# Patient Record
Sex: Male | Born: 1965 | Race: White | Hispanic: No | Marital: Single | Smoking: Former smoker
Health system: Southern US, Community
[De-identification: ages and names within clinical notes are randomized; demographics above are authoritative.]

## PROBLEM LIST (undated history)

## (undated) ENCOUNTER — Emergency Department (HOSPITAL_COMMUNITY): Payer: BC Managed Care – PPO

## (undated) DIAGNOSIS — I201 Angina pectoris with documented spasm: Secondary | ICD-10-CM

## (undated) DIAGNOSIS — E785 Hyperlipidemia, unspecified: Secondary | ICD-10-CM

## (undated) DIAGNOSIS — Z9889 Other specified postprocedural states: Secondary | ICD-10-CM

## (undated) DIAGNOSIS — E119 Type 2 diabetes mellitus without complications: Secondary | ICD-10-CM

## (undated) DIAGNOSIS — I2510 Atherosclerotic heart disease of native coronary artery without angina pectoris: Secondary | ICD-10-CM

## (undated) DIAGNOSIS — R112 Nausea with vomiting, unspecified: Secondary | ICD-10-CM

## (undated) DIAGNOSIS — M1990 Unspecified osteoarthritis, unspecified site: Secondary | ICD-10-CM

## (undated) DIAGNOSIS — K219 Gastro-esophageal reflux disease without esophagitis: Secondary | ICD-10-CM

## (undated) DIAGNOSIS — I10 Essential (primary) hypertension: Secondary | ICD-10-CM

## (undated) DIAGNOSIS — T4145XA Adverse effect of unspecified anesthetic, initial encounter: Secondary | ICD-10-CM

## (undated) DIAGNOSIS — K259 Gastric ulcer, unspecified as acute or chronic, without hemorrhage or perforation: Secondary | ICD-10-CM

## (undated) DIAGNOSIS — I219 Acute myocardial infarction, unspecified: Secondary | ICD-10-CM

## (undated) DIAGNOSIS — K5900 Constipation, unspecified: Secondary | ICD-10-CM

## (undated) HISTORY — DX: Essential (primary) hypertension: I10

## (undated) HISTORY — DX: Type 2 diabetes mellitus without complications: E11.9

## (undated) HISTORY — DX: Angina pectoris with documented spasm: I20.1

---

## 2000-06-24 HISTORY — PX: KNEE ARTHROSCOPY: SUR90

## 2001-12-31 ENCOUNTER — Ambulatory Visit (HOSPITAL_COMMUNITY): Admission: RE | Admit: 2001-12-31 | Discharge: 2001-12-31 | Payer: Self-pay | Admitting: Internal Medicine

## 2001-12-31 ENCOUNTER — Encounter: Payer: Self-pay | Admitting: Internal Medicine

## 2011-06-25 HISTORY — PX: CARDIAC CATHETERIZATION: SHX172

## 2011-08-18 ENCOUNTER — Encounter (HOSPITAL_COMMUNITY)
Admission: EM | Disposition: A | Discharge status: Home / Self Care | Payer: Self-pay | Source: Ambulatory Visit | Attending: Cardiovascular Disease

## 2011-08-18 ENCOUNTER — Inpatient Hospital Stay (HOSPITAL_COMMUNITY)
Admission: EM | Admit: 2011-08-18 | Discharge: 2011-08-20 | DRG: 287 | Disposition: A | Discharge status: Home / Self Care | Payer: Self-pay | Source: Ambulatory Visit | Attending: Cardiovascular Disease | Admitting: Cardiovascular Disease

## 2011-08-18 ENCOUNTER — Other Ambulatory Visit: Payer: Self-pay

## 2011-08-18 ENCOUNTER — Encounter (HOSPITAL_COMMUNITY): Payer: Self-pay | Admitting: *Deleted

## 2011-08-18 DIAGNOSIS — Z79899 Other long term (current) drug therapy: Secondary | ICD-10-CM

## 2011-08-18 DIAGNOSIS — Z6838 Body mass index (BMI) 38.0-38.9, adult: Secondary | ICD-10-CM

## 2011-08-18 DIAGNOSIS — G4733 Obstructive sleep apnea (adult) (pediatric): Secondary | ICD-10-CM | POA: Diagnosis present

## 2011-08-18 DIAGNOSIS — E785 Hyperlipidemia, unspecified: Secondary | ICD-10-CM | POA: Diagnosis present

## 2011-08-18 DIAGNOSIS — I2510 Atherosclerotic heart disease of native coronary artery without angina pectoris: Secondary | ICD-10-CM | POA: Diagnosis present

## 2011-08-18 DIAGNOSIS — R9431 Abnormal electrocardiogram [ECG] [EKG]: Secondary | ICD-10-CM | POA: Diagnosis present

## 2011-08-18 DIAGNOSIS — E669 Obesity, unspecified: Secondary | ICD-10-CM | POA: Diagnosis present

## 2011-08-18 DIAGNOSIS — E119 Type 2 diabetes mellitus without complications: Secondary | ICD-10-CM | POA: Diagnosis present

## 2011-08-18 DIAGNOSIS — I201 Angina pectoris with documented spasm: Principal | ICD-10-CM | POA: Diagnosis present

## 2011-08-18 HISTORY — PX: LEFT HEART CATH: SHX5478

## 2011-08-18 LAB — CARDIAC PANEL(CRET KIN+CKTOT+MB+TROPI)
CK, MB: 2 ng/mL (ref 0.3–4.0)
CK, MB: 2.1 ng/mL (ref 0.3–4.0)
Relative Index: INVALID (ref 0.0–2.5)
Relative Index: INVALID (ref 0.0–2.5)
Total CK: 99 U/L (ref 7–232)
Total CK: 89 U/L (ref 7–232)
Troponin I: <0.3 ng/mL (ref <0.30)
Troponin I: <0.3 ng/mL (ref <0.30)

## 2011-08-18 LAB — POCT I-STAT, CHEM 8
BUN: 9 mg/dL (ref 6–23)
Calcium, Ion: 1.24 mmol/L (ref 1.12–1.32)
Chloride: 102 mEq/L (ref 96–112)
Creatinine, Ser: 0.8 mg/dL (ref 0.50–1.35)
Glucose, Bld: 262 mg/dL — ABNORMAL HIGH (ref 70–99)
TCO2: 23 mmol/L (ref 0–100)

## 2011-08-18 LAB — HEMOGLOBIN A1C
Hgb A1c MFr Bld: 8.1 % — ABNORMAL HIGH (ref <5.7)
Mean Plasma Glucose: 186 mg/dL — ABNORMAL HIGH (ref <117)

## 2011-08-18 LAB — GLUCOSE, CAPILLARY: Glucose-Capillary: 196 mg/dL — ABNORMAL HIGH (ref 70–99)

## 2011-08-18 SURGERY — LEFT HEART CATH
Anesthesia: Moderate Sedation

## 2011-08-18 MED ORDER — NITROGLYCERIN 0.2 MG/ML ON CALL CATH LAB
INTRAVENOUS | Status: AC
  Filled 2011-08-18: qty 1

## 2011-08-18 MED ORDER — METOPROLOL TARTRATE 12.5 MG HALF TABLET
12.5000 mg | ORAL_TABLET | Freq: Two times a day (BID) | ORAL | Status: DC
  Administered 2011-08-18 – 2011-08-20 (×4): 12.5 mg via ORAL
  Filled 2011-08-18 (×6): qty 1

## 2011-08-18 MED ORDER — PANTOPRAZOLE SODIUM 40 MG PO TBEC
40.0000 mg | DELAYED_RELEASE_TABLET | Freq: Every day | ORAL | Status: DC
  Administered 2011-08-18 – 2011-08-20 (×3): 40 mg via ORAL
  Filled 2011-08-18 (×3): qty 1

## 2011-08-18 MED ORDER — NITROGLYCERIN IN D5W 200-5 MCG/ML-% IV SOLN: 2.0000 ug/min | INTRAVENOUS | Status: DC

## 2011-08-18 MED ORDER — ACETAMINOPHEN 325 MG PO TABS: 650.0000 mg | ORAL_TABLET | ORAL | Status: DC | PRN

## 2011-08-18 MED ORDER — ASPIRIN 81 MG PO CHEW
81.0000 mg | CHEWABLE_TABLET | Freq: Every day | ORAL | Status: DC
  Administered 2011-08-18 – 2011-08-20 (×3): 81 mg via ORAL
  Filled 2011-08-18 (×3): qty 1

## 2011-08-18 MED ORDER — SODIUM CHLORIDE 0.9 % IV SOLN: INTRAVENOUS | Status: DC

## 2011-08-18 MED ORDER — INSULIN ASPART 100 UNIT/ML ~~LOC~~ SOLN
0.0000 [IU] | Freq: Three times a day (TID) | SUBCUTANEOUS | Status: DC
  Administered 2011-08-18 – 2011-08-20 (×5): 2 [IU] via SUBCUTANEOUS
  Filled 2011-08-18: qty 3

## 2011-08-18 MED ORDER — ZOLPIDEM TARTRATE 5 MG PO TABS
10.0000 mg | ORAL_TABLET | Freq: Every evening | ORAL | Status: DC | PRN
  Administered 2011-08-18: 10 mg via ORAL
  Filled 2011-08-18: qty 2

## 2011-08-18 MED ORDER — METFORMIN HCL 500 MG PO TABS
500.0000 mg | ORAL_TABLET | Freq: Two times a day (BID) | ORAL | Status: DC
  Filled 2011-08-18 (×3): qty 1

## 2011-08-18 MED ORDER — ATORVASTATIN CALCIUM 40 MG PO TABS
40.0000 mg | ORAL_TABLET | Freq: Every day | ORAL | Status: DC
  Administered 2011-08-18 – 2011-08-19 (×2): 40 mg via ORAL
  Filled 2011-08-18 (×3): qty 1

## 2011-08-18 MED ORDER — ONDANSETRON HCL 4 MG/2ML IJ SOLN: 4.0000 mg | Freq: Four times a day (QID) | INTRAMUSCULAR | Status: DC | PRN

## 2011-08-18 MED ORDER — HEPARIN (PORCINE) IN NACL 2-0.9 UNIT/ML-% IJ SOLN
INTRAMUSCULAR | Status: AC
  Filled 2011-08-18: qty 2000

## 2011-08-18 MED ORDER — MIDAZOLAM HCL 2 MG/2ML IJ SOLN
INTRAMUSCULAR | Status: AC
  Filled 2011-08-18: qty 2

## 2011-08-18 MED ORDER — FENTANYL CITRATE 0.05 MG/ML IJ SOLN
INTRAMUSCULAR | Status: AC
  Filled 2011-08-18: qty 2

## 2011-08-18 MED ORDER — LIDOCAINE HCL (PF) 1 % IJ SOLN
INTRAMUSCULAR | Status: AC
  Filled 2011-08-18: qty 30

## 2011-08-18 MED ORDER — INSULIN ASPART 100 UNIT/ML ~~LOC~~ SOLN
0.0000 [IU] | Freq: Every day | SUBCUTANEOUS | Status: DC
  Filled 2011-08-18: qty 3

## 2011-08-18 NOTE — H&P (Signed)
Patient ID: Jerome Shepherd MRN: [id number], DOB/AGE: 08-01-1965   Admit date: 08/18/2011   Primary Physician: No primary provider on file. Primary Cardiologist: Dr Claiborne Billings  HPI: Patient is a 46 year old male with no prior history of coronary disease but a history of diabetes who developed substernal chest pain around 5 AM and presented to [facility]. Patient describes awakening with a severe midsternal discomfort. There was no radiation to his jaw or arms. There was no associated shortness of breath or nausea and vomiting. He did not have diaphoresis. He went and woke up his neighbor who drove in to the hospital. At [facility] his EKG suggested some anterior ST elevation. He was transferred to [place] as a ST elevation MI. Patient was taken to the cath lab by Dr. Claiborne Billings. Please see his Note for complete details. The patient denies any recent exertional dyspnea or chest pain.   Problem List: Past Medical History  Diagnosis Date  . Diabetes mellitus     Past Surgical History  Procedure Date  . No past surgeries      Allergies:  Allergies  Allergen Reactions  . Codeine Nausea Only  . Sulfa Antibiotics Nausea Only     Home Medications Prescriptions prior to admission  Medication Sig Dispense Refill  . ibuprofen (ADVIL) 200 MG tablet Take 200 mg by mouth every 6 (six) hours as needed. For pain/headaches.      . metFORMIN (GLUCOPHAGE) 500 MG tablet Take 1,000 mg by mouth 2 (two) times daily with a meal.         Family history negative for CAD.    History   Social History  . Marital Status: Divorced    Spouse Name: N/A    Number of Children: N/A  . Years of Education: N/A   Occupational History  . Not on file.   Social History Main Topics  . Smoking status: Former Smoker    Quit date: 08/18/1995  . Smokeless tobacco: Not on file  . Alcohol Use: No  . Drug Use: No  . Sexually Active:    Other Topics Concern  . Not on file   Social History Narrative    Divorced, no children, works as a Production designer, theatre/television/film.     Review of Systems: General: negative for chills, fever, night sweats or weight changes.  Cardiovascular: negative for chest pain, dyspnea on exertion, edema, orthopnea, palpitations, paroxysmal nocturnal dyspnea or shortness of breath Dermatological: negative for rash Respiratory: negative for cough or wheezing Urologic: negative for hematuria Abdominal: negative for nausea, vomiting, diarrhea, bright red blood per rectum, melena, or hematemesis Neurologic: negative for visual changes, syncope, or dizziness All other systems reviewed and are otherwise negative except as noted above.  Physical Exam: Blood pressure 131/64, pulse 51, temperature 98.2 F (36.8 C), temperature source Oral, resp. rate 16, height 6' 4"  (1.93 m), weight 143 kg (315 lb 4.1 oz), SpO2 96.00%.  General appearance: alert, cooperative, appears stated age, no distress and mildly obese Neck: no adenopathy, no carotid bruit, no JVD, supple, symmetrical, trachea midline and thyroid not enlarged, symmetric, no tenderness/mass/nodules Lungs: clear to auscultation bilaterally Heart: regular rate and rhythm, S1, S2 normal, no murmur, click, rub or gallop Abdomen: soft, non-tender; bowel sounds normal; no masses,  no organomegaly Extremities: extremities normal, atraumatic, no cyanosis or edema Pulses: 2+ and symmetric Skin: Skin color, texture, turgor normal. No rashes or lesions Neurologic: Grossly normal    Labs:   Results for orders placed during the hospital  encounter of 08/18/11 (from the past 24 hour(s))  POCT I-STAT, CHEM 8     Status: Abnormal   Collection Time   08/18/11  8:06 AM      Component Value Range   Sodium 138  135 - 145 (mEq/L)   Potassium 3.7  3.5 - 5.1 (mEq/L)   Chloride 102  96 - 112 (mEq/L)   BUN 9  6 - 23 (mg/dL)   Creatinine, Ser 0.80  0.50 - 1.35 (mg/dL)   Glucose, Bld 262 (*) 70 - 99 (mg/dL)   Calcium, Ion 1.24  1.12 - 1.32  (mmol/L)   TCO2 23  0 - 100 (mmol/L)   Hemoglobin 14.3  13.0 - 17.0 (g/dL)   HCT 42.0  39.0 - 52.0 (%)  GLUCOSE, CAPILLARY     Status: Abnormal   Collection Time   08/18/11 12:26 PM      Component Value Range   Glucose-Capillary 169 (*) 70 - 99 (mg/dL)     Radiology/Studies: No results found.  EKG:NSR with suggestion of ST elevation in V2 and V3  ASSESSMENT AND PLAN:   Principal Problem:  *STEMI (ST elevation myocardial infarction)  Active Problems:  Chest pain  Diabetes mellitus, Type 2  Obesity, suspected sleep apnea  Plan-urgent cath  Henri Medal, PA-C 08/18/2011, 12:53 PM

## 2011-08-18 NOTE — Brief Op Note (Signed)
08/18/2011  8:31 AM  PATIENT:  Jerome Shepherd  46 y.o. male  PRE-OPERATIVE DIAGNOSIS:  ACS   Full note dictated; see diagram  DICTATION #  [id number], [id number]  Mild non obstructive CAD; suspect transient coronary vasospasm to  LAD improved with NTG leading to 2 mm J pt elevation V1 - V2 which resolved.  Medical therapy with nitrates/ statin.  Needs evaluation for OSA.  Troy Sine, MD, [facility] 08/18/2011 8:31 AM

## 2011-08-18 NOTE — Cardiovascular Report (Signed)
NAMEAXCEL, HORSCH NO.:  [id number]  MEDICAL RECORD NO.:  [id number]  LOCATION:  2914                         FACILITY:  [facility]  PHYSICIAN:  Shelva Majestic, M.D.     DATE OF BIRTH:  [birth year]  DATE OF PROCEDURE: DATE OF DISCHARGE:                           CARDIAC CATHETERIZATION   INDICATIONS:  [name] is a 46 year old, Caucasian gentleman with history of obesity as well as at least 20-year history of diabetes mellitus.  Last evening, the patient went to [employer].  This morning at approximately 5 a.m., he was awakened from sleep with severe pressure and chest discomfort.  He ultimately presented to [facility] where his ECG suggested early acute coronary syndrome, ST-segment elevation in leads V1, V2, with up to 2 mm J-point elevation with hyperacute T-waves.  The patient was transported to [facility] for possible code STEMI.  En route, he did receive heparin as well as nitroglycerin.  Chest pain completely resolved upon arrival to [facility] where he was taken directly to the catheterizations laboratory.  In the catheterizations laboratory, there was resolution of his ST-segment elevation.  However, with his risk factor profile, the decision was made to proceed with cardiac catheterizations for probable acute coronary syndrome/coronary vasal spasm.  DESCRIPTION OF PROCEDURE:  The patient was prepped and draped in usual fashion.  His right femoral artery is punctured anteriorly and a 6- [place] sheath was inserted without difficulty.  Versed 2 mg plus fentanyl 50 mcg were administered.  Coronary angiography was performed utilizing 6-French Judkins 4 left and right coronary catheters.  200 mcg of intracoronary nitroglycerin was also selectively administered down the left coronary system.  A 6-French pigtail catheter was used for biplane cine left ventriculography.  Distal aortography was performed. He tolerated the procedure well.  He  will be transported to the transitional care unit for followup evaluation.  HEMODYNAMIC DATA:  Central aortic pressure was 129/78.  Left ventricular pressure 129/9, post A-wave 16.  ANGIOGRAPHIC DATA:  Left main coronary artery was a long normal vessel, which had some very smooth, mild distal taper prior to trifurcating into an LAD, intermediate, and circumflex system.  The LAD had luminal irregularities of 20-30% proximally in the region of the 1st septal and diagonal vessel.  In the mid LAD, there did appear to be an area of mild narrowing of 40%, which appeared in an area that possibly dipped intramyocardially.  There was TIMI-3 flow.  The LAD extended to and wrapped around the LV apex.  The intermediate like vessel was angiographically normal.  The circumflex vessel had mild narrowing in its mid segment prior to bifurcating into a small distal branch and a distal obtuse marginal vessel.  The right coronary artery was a large caliber dominant vessel that had luminal irregularities of 30% in the proximal to mid segment with 30% narrowing in the mid segment.  The RCA supplied the PDA and posterolateral branch.  Following IC nitroglycerin to the LAD, the mid LAD area seemed to improve as did the circumflex area to less than 20%.  Biplane cine left ventriculography suggested left ventricular hypertrophy.  Ejection fraction was approximately 60%.  No definitive wall  motion abnormality was demonstrated.  There is a questionable subtle mild abnormality apically, which was not definitive.  Distal aortography revealed widely patent renal arteries.  There was no evidence for dissection.  There was no significant aortoiliac disease.  IMPRESSION: 1. Probable acute coronary syndrome, leading to transient coronary     vasospasm, relieved with nitroglycerin. 2. Mild, currently nonobstructive coronary artery disease, with 20-30%     proximal left anterior descending stenoses, 40% mid  left anterior     descending narrowing, 40% circumflex narrowing, and scattered 30%     proximal to mid right coronary artery narrowing.  The left system     narrowing were improved following IC nitroglycerin. 3. Normal left ventricular function with suggestion of left     ventricular hypertrophy without definitive wall motion abnormality.  DISCUSSION:  Mr. Hunley is 46 year old gentleman, who has at least 88- year history of diabetes mellitus as well as a longstanding history of obesity.  Late last evening, he did have [employer] [place].  He has had difficulty with sleep.  At approximately 50 a.m. this morning, he awakened from a deep sleep with severe pressure in his chest, suggesting most likely sleep apnea induced during REM sleep with associated possible vasospasm in this setting of endothelial dysfunction contributed by his fatty meal intake prior to going to bed.  His chest pain completely resolved with nitroglycerin.  Initial ECG did show 2 mm ST-segment elevation in leads V1 and V2, with complete resolution following nitroglycerin.  Catheterizations reveals nonobstructive coronary artery disease, but with evidence for luminal irregularities as noted above.  He will be aggressively treated with medical therapy, including nitrates, lipid-lowering therapy, as well as started on aspirin.  The patient will require a sleep study to evaluate for high likelihood of obstructive sleep apnea.          ______________________________ Shelva Majestic, M.D.     TK/MEDQ  D:  08/18/2011  T:  08/18/2011  Job:  [id number]  cc:   Jerene Bears, MD

## 2011-08-19 LAB — COMPREHENSIVE METABOLIC PANEL
ALT: 23 U/L (ref 0–53)
AST: 35 U/L (ref 0–37)
Albumin: 3.4 g/dL — ABNORMAL LOW (ref 3.5–5.2)
Alkaline Phosphatase: 65 U/L (ref 39–117)
BUN: 9 mg/dL (ref 6–23)
CO2: 23 mEq/L (ref 19–32)
Calcium: 9.3 mg/dL (ref 8.4–10.5)
Chloride: 105 mEq/L (ref 96–112)
Creatinine, Ser: 0.9 mg/dL (ref 0.50–1.35)
GFR calc Af Amer: >90 mL/min (ref >90)
GFR calc non Af Amer: >90 mL/min (ref >90)
Glucose, Bld: 165 mg/dL — ABNORMAL HIGH (ref 70–99)
Potassium: 3.9 mEq/L (ref 3.5–5.1)
Sodium: 138 mEq/L (ref 135–145)
Total Bilirubin: 0.7 mg/dL (ref 0.3–1.2)
Total Protein: 6.2 g/dL (ref 6.0–8.3)

## 2011-08-19 LAB — LIPID PANEL
Cholesterol: 150 mg/dL (ref 0–200)
HDL: 32 mg/dL — ABNORMAL LOW (ref >39)
LDL Cholesterol: 83 mg/dL (ref 0–99)
Total CHOL/HDL Ratio: 4.7 RATIO
Triglycerides: 177 mg/dL — ABNORMAL HIGH (ref <150)
VLDL: 35 mg/dL (ref 0–40)

## 2011-08-19 LAB — CBC
HCT: 42.6 % (ref 39.0–52.0)
Hemoglobin: 15.5 g/dL (ref 13.0–17.0)
MCH: 32.2 pg (ref 26.0–34.0)
MCHC: 36.4 g/dL — ABNORMAL HIGH (ref 30.0–36.0)
MCV: 88.4 fL (ref 78.0–100.0)
Platelets: 197 10*3/uL (ref 150–400)
RBC: 4.82 MIL/uL (ref 4.22–5.81)
RDW: 12.3 % (ref 11.5–15.5)
WBC: 9.4 10*3/uL (ref 4.0–10.5)

## 2011-08-19 LAB — CARDIAC PANEL(CRET KIN+CKTOT+MB+TROPI)
CK, MB: 2.1 ng/mL (ref 0.3–4.0)
Relative Index: INVALID (ref 0.0–2.5)
Total CK: 75 U/L (ref 7–232)
Troponin I: <0.3 ng/mL (ref <0.30)

## 2011-08-19 LAB — GLUCOSE, CAPILLARY
Glucose-Capillary: 157 mg/dL — ABNORMAL HIGH (ref 70–99)
Glucose-Capillary: 134 mg/dL — ABNORMAL HIGH (ref 70–99)

## 2011-08-19 MED ORDER — ZOLPIDEM TARTRATE 5 MG PO TABS
10.0000 mg | ORAL_TABLET | Freq: Every evening | ORAL | Status: DC | PRN
  Administered 2011-08-19: 10 mg via ORAL
  Filled 2011-08-19: qty 2

## 2011-08-19 MED ORDER — AMLODIPINE BESYLATE 5 MG PO TABS
5.0000 mg | ORAL_TABLET | Freq: Every day | ORAL | Status: DC
  Administered 2011-08-19 – 2011-08-20 (×2): 5 mg via ORAL
  Filled 2011-08-19 (×2): qty 1

## 2011-08-19 MED ORDER — NIACIN ER (ANTIHYPERLIPIDEMIC) 500 MG PO TBCR
500.0000 mg | EXTENDED_RELEASE_TABLET | Freq: Every day | ORAL | Status: DC
  Administered 2011-08-19: 500 mg via ORAL
  Filled 2011-08-19 (×2): qty 1

## 2011-08-19 MED ORDER — ISOSORBIDE MONONITRATE 15 MG HALF TABLET
15.0000 mg | ORAL_TABLET | Freq: Every day | ORAL | Status: DC
  Administered 2011-08-19: 15 mg via ORAL
  Filled 2011-08-19: qty 1

## 2011-08-19 NOTE — Progress Notes (Signed)
Patient being transferred to 2010, report given to [facility]. Discussed reason for admission and history, all question answered.

## 2011-08-19 NOTE — H&P (Signed)
Please see my dictated cath note for history. Agree with note by Oswaldo Done, PAC. Pt has greater than 20 yr h/o DM, h/o obesity who presented today to [facility] with early J point elevation precordially suggesting early STEMI.  His ST changes resolved with heparin and NTG. Pt was taken acutely to cath lab for ACS.

## 2011-08-19 NOTE — Progress Notes (Signed)
UR Completed. Simmons, [name] F [phone]  

## 2011-08-19 NOTE — Progress Notes (Signed)
Inpatient Diabetes Program Recommendations  AACE/ADA: New Consensus Statement on Inpatient Glycemic Control (2009)  Target Ranges:  Prepandial:   less than 140 mg/dL      Peak postprandial:   less than 180 mg/dL (1-2 hours)      Critically ill patients:  140 - 180 mg/dL   Reason for Visit: Elevated Hbg A1C  Note: Patient has no insurance.  Informed him both verbally and in written form regarding OP diabetes classes offered at no charge at [facility] which are funded by a grant.  Also gave him written information regarding a cost-effective glucose meter from [employer].  States he has been told of another resource for a free meter and strips.  Instructed him regarding access to the Patient Education Network so he can view instructional videos regarding diabetes and other topics.  Patient states that he has made up his mind to start walking again and make dietary changes.  Has desire to improve health.  Plans to go for sleep study, etc and hopes to improve health, lose weight, etc.

## 2011-08-19 NOTE — Progress Notes (Signed)
[facility]  Subjective: No Complaints  Objective: Vital signs in last 24 hours: Temp:  [97.8 F (36.6 C)-98.3 F (36.8 C)] 98.3 F (36.8 C) (02/25 0741) Pulse Rate:  [51-69] 65  (02/25 0400) Resp:  [13-18] 18  (02/25 0740) BP: (102-136)/(45-75) 114/61 mmHg (02/25 0740) SpO2:  [96 %-99 %] 96 % (02/25 0740) Last BM Date: 08/17/11  Intake/Output from previous day: 02/24 0701 - 02/25 0700 In: 2175 [I.V.:2175] Out: 1500 [Urine:1500] Intake/Output this shift: Total I/O In: 13 [I.V.:13] Out: -   Medications Current Facility-Administered Medications  Medication Dose Route Frequency Provider Last Rate Last Dose  . 0.9 %  sodium chloride infusion   Intravenous Continuous Troy Sine, MD      . acetaminophen (TYLENOL) tablet 650 mg  650 mg Oral Q4H PRN Troy Sine, MD      . aspirin chewable tablet 81 mg  81 mg Oral Daily Troy Sine, MD   81 mg at 08/19/11 8786  . atorvastatin (LIPITOR) tablet 40 mg  40 mg Oral q1800 Troy Sine, MD   40 mg at 08/18/11 1744  . insulin aspart (novoLOG) injection 0-5 Units  0-5 Units Subcutaneous QHS Doreene Burke [place], [place]      . insulin aspart (novoLOG) injection 0-9 Units  0-9 Units Subcutaneous TID WC Erlene Quan, PA   2 Units at 08/18/11 1745  . metoprolol tartrate (LOPRESSOR) tablet 12.5 mg  12.5 mg Oral BID Doreene Burke [place], PA   12.5 mg at 08/19/11 7672  . nitroGLYCERIN 0.2 mg/mL in dextrose 5 % infusion  2-200 mcg/min Intravenous Titrated Troy Sine, MD      . ondansetron [facility]) injection 4 mg  4 mg Intravenous Q6H PRN Troy Sine, MD      . pantoprazole (PROTONIX) EC tablet 40 mg  40 mg Oral Daily Doreene Burke [place], [place]   40 mg at 08/19/11 0947  . zolpidem (AMBIEN) tablet 10 mg  10 mg Oral QHS PRN Troy Sine, MD   10 mg at 08/18/11 2220    PE: General appearance: alert, cooperative and no distress Lungs: clear to auscultation bilaterally Heart: regular rate and rhythm, S1, S2 normal, no murmur,  click, rub or gallop Extremities: No LEE Pulses: 2+ and symmetric Right Groin:  Nontender no hematoma or erythema  Lab Results:   Results for orders placed during the hospital encounter of 08/18/11 (from the past 24 hour(s))  CARDIAC PANEL(CRET KIN+CKTOT+MB+TROPI)     Status: Normal   Collection Time   08/18/11 12:46 PM      Component Value Range   Total CK 99  7 - 232 (U/L)   CK, MB 2.0  0.3 - 4.0 (ng/mL)   Troponin I <0.30  <0.30 (ng/mL)   Relative Index RELATIVE INDEX IS INVALID  0.0 - 2.5   GLUCOSE, CAPILLARY     Status: Abnormal   Collection Time   08/18/11  4:27 PM      Component Value Range   Glucose-Capillary 196 (*) 70 - 99 (mg/dL)  CARDIAC PANEL(CRET KIN+CKTOT+MB+TROPI)     Status: Normal   Collection Time   08/18/11  9:05 PM      Component Value Range   Total CK 89  7 - 232 (U/L)   CK, MB 2.1  0.3 - 4.0 (ng/mL)   Troponin I <0.30  <0.30 (ng/mL)   Relative Index RELATIVE INDEX IS INVALID  0.0 - 2.5   GLUCOSE, CAPILLARY  Status: Abnormal   Collection Time   08/18/11 10:32 PM      Component Value Range   Glucose-Capillary 169 (*) 70 - 99 (mg/dL)  CBC     Status: Abnormal   Collection Time   08/19/11  5:00 AM      Component Value Range   WBC 9.4  4.0 - 10.5 (K/uL)   RBC 4.82  4.22 - 5.81 (MIL/uL)   Hemoglobin 15.5  13.0 - 17.0 (g/dL)   HCT 42.6  39.0 - 52.0 (%)   MCV 88.4  78.0 - 100.0 (fL)   MCH 32.2  26.0 - 34.0 (pg)   MCHC 36.4 (*) 30.0 - 36.0 (g/dL)   RDW 12.3  11.5 - 15.5 (%)   Platelets 197  150 - 400 (K/uL)  COMPREHENSIVE METABOLIC PANEL     Status: Abnormal   Collection Time   08/19/11  5:00 AM      Component Value Range   Sodium 138  135 - 145 (mEq/L)   Potassium 3.9  3.5 - 5.1 (mEq/L)   Chloride 105  96 - 112 (mEq/L)   CO2 23  19 - 32 (mEq/L)   Glucose, Bld 165 (*) 70 - 99 (mg/dL)   BUN 9  6 - 23 (mg/dL)   Creatinine, Ser 0.90  0.50 - 1.35 (mg/dL)   Calcium 9.3  8.4 - 10.5 (mg/dL)   Total Protein 6.2  6.0 - 8.3 (g/dL)   Albumin 3.4 (*) 3.5 -  5.2 (g/dL)   AST 35  0 - 37 (U/L)   ALT 23  0 - 53 (U/L)   Alkaline Phosphatase 65  39 - 117 (U/L)   Total Bilirubin 0.7  0.3 - 1.2 (mg/dL)   GFR calc non Af Amer >90  >90 (mL/min)   GFR calc Af Amer >90  >90 (mL/min)  LIPID PANEL     Status: Abnormal   Collection Time   08/19/11  5:00 AM      Component Value Range   Cholesterol 150  0 - 200 (mg/dL)   Triglycerides 177 (*) <150 (mg/dL)   HDL 32 (*) >39 (mg/dL)   Total CHOL/HDL Ratio 4.7     VLDL 35  0 - 40 (mg/dL)   LDL Cholesterol 83  0 - 99 (mg/dL)  CARDIAC PANEL(CRET KIN+CKTOT+MB+TROPI)     Status: Normal   Collection Time   08/19/11  6:57 AM      Component Value Range   Total CK 75  7 - 232 (U/L)   CK, MB 2.1  0.3 - 4.0 (ng/mL)   Troponin I <0.30  <0.30 (ng/mL)   Relative Index RELATIVE INDEX IS INVALID  0.0 - 2.5   GLUCOSE, CAPILLARY     Status: Abnormal   Collection Time   08/19/11  7:40 AM      Component Value Range   Glucose-Capillary 157 (*) 70 - 99 (mg/dL)  GLUCOSE, CAPILLARY     Status: Abnormal   Collection Time   08/19/11 11:49 AM      Component Value Range   Glucose-Capillary 177 (*) 70 - 99 (mg/dL)      Basename 08/19/11 0500 08/18/11 0806  WBC 9.4 --  HGB 15.5 14.3  HCT 42.6 42.0  PLT 197 --   BMET  Basename 08/19/11 0500 08/18/11 0806  NA 138 138  K 3.9 3.7  CL 105 102  CO2 23 --  GLUCOSE 165* 262*  BUN 9 9  CREATININE 0.90 0.80  CALCIUM 9.3 --  PT/INR No results found for this basename: LABPROT:3,INR:3 in the last 72 hours Cholesterol Lipid Panel     Component Value Date/Time   CHOL 150 08/19/2011 0500   TRIG 177* 08/19/2011 0500   HDL 32* 08/19/2011 0500   CHOLHDL 4.7 08/19/2011 0500   VLDL 35 08/19/2011 0500   LDLCALC 83 08/19/2011 0500     Basename 08/19/11 0500  CHOL 150   Cardiac Enzymes Cardiac Enzymes negative x3.  Assessment/Plan   Principal Problem:  *STEMI (ST elevation myocardial infarction) Active Problems:  Chest pain  Diabetes mellitus, Type 2   Obesity Dyslipidemia  Plan: STEMI secondary to suspected transient vasospasm. Elevated triglycerides and low HDL.  A1C= 8.1.  BP and HR stable and well controlled.   Other labs good.  ASA/ Statin/lopressor.  ? CCB.  Weaning off IV Nitro.  Will start Imdur 44m daily. Diet and exercise discussed at length.  Needs OSA eval.   LOS: 1 day    HAGER,BRYAN W 08/19/2011 9:46 AM    Patient seen and examined. Agree with assessment and plan. Pt presented yesterday with Acute coronary syndrome. Suspect transient LAD vasospasm.  ECG now normal. Enzymes are negative.  Will tx to 2000 today.  Add niaspam to statin for atherogenic dyslipidemic profile.  Pt has used viagra in past; therefore, will change nitrates to amlodipine.  Ambulate today, and plan DC home tomorrow with outpatient sleep study for hi liklihood of OSA.   TTroy Sine MD, [facility]2/25/2013 12:42 PM

## 2011-08-20 LAB — GLUCOSE, CAPILLARY
Glucose-Capillary: 184 mg/dL — ABNORMAL HIGH (ref 70–99)
Glucose-Capillary: 154 mg/dL — ABNORMAL HIGH (ref 70–99)

## 2011-08-20 MED ORDER — AMLODIPINE BESYLATE 5 MG PO TABS: 5.0000 mg | ORAL_TABLET | Freq: Every day | ORAL | Status: DC

## 2011-08-20 MED ORDER — NITROGLYCERIN 0.4 MG SL SUBL: 0.4000 mg | SUBLINGUAL_TABLET | SUBLINGUAL | Status: DC | PRN

## 2011-08-20 MED ORDER — ATORVASTATIN CALCIUM 40 MG PO TABS: 40.0000 mg | ORAL_TABLET | Freq: Every day | ORAL | Status: DC

## 2011-08-20 MED ORDER — NIACIN ER (ANTIHYPERLIPIDEMIC) 500 MG PO TBCR: 500.0000 mg | EXTENDED_RELEASE_TABLET | Freq: Every day | ORAL | Status: DC

## 2011-08-20 MED ORDER — METOPROLOL TARTRATE 25 MG PO TABS: 12.5000 mg | ORAL_TABLET | Freq: Two times a day (BID) | ORAL | Status: DC

## 2011-08-20 MED ORDER — ASPIRIN 81 MG PO CHEW: 81.0000 mg | CHEWABLE_TABLET | Freq: Every day | ORAL | Status: AC

## 2011-08-20 NOTE — Discharge Instructions (Signed)
Call [facility] if any bleeding, swelling or drainage at cath site.  May shower, no tub baths for 48 hours for groin sticks.   Take 1 NTG, under your tongue, while sitting.  If no relief of pain may repeat NTG, one tab every 5 minutes up to 3 tablets total over 15 minutes.  If no relief CALL 911.  If you have dizziness/lightheadness  while taking NTG, stop taking and call 911.    DO NOT TAKE NTG WITHIN 72 HOURS OF VIAGRA  No lifting for 1 week, no driving for 3 days. May return to work in 1 week. Diabetic classes at [facility]. Heart Healthy Diabetic diet.  Resume Metformin 08/21/11.  Take your daily Asprin 30 minutes prior to Niaspan, and take the Niaspan with low fat crackers or snack.  No viagra until you see Dr. Claiborne Billings.  We will call to schedule a sleep study for March.

## 2011-08-20 NOTE — Progress Notes (Signed)
CARDIAC REHAB PHASE I   PRE:  Rate/Rhythm: 68SR  BP:  Supine:   Sitting: 130/80  Standing:    SaO2:   MODE:  Ambulation: 550 ft   POST:  Rate/Rhythem: 78  BP:  Supine:   Sitting: 140/90  Standing:    SaO2:  1110-1200 Pt walked 550 ft with steady gait. Tolerated well. No CP. Education completed with pt. 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

## 2011-08-20 NOTE — Progress Notes (Signed)
Pt. Discharged 08/20/2011  3:13 PM Discharge instructions reviewed with patient/family. Patient/family verbalized understanding. All Rx's given. Questions answered as needed. Pt. Discharged to home with family/self. Taken off unit via W/C. Vergie Living

## 2011-08-20 NOTE — Progress Notes (Addendum)
Subjective: No chest pain  Has medication questions to discuss with Dr. Claiborne Billings  Objective: Vital signs in last 24 hours: Temp:  [97.8 F (36.6 C)-98.4 F (36.9 C)] 97.8 F (36.6 C) (02/26 0510) Pulse Rate:  [54-73] 56  (02/26 0510) Resp:  [18] 18  (02/26 0510) BP: (121-147)/(54-80) 147/62 mmHg (02/26 0510) SpO2:  [94 %-98 %] 94 % (02/26 0510) Weight change:  Last BM Date: 08/17/11 Intake/Output from previous day: +52 02/25 0701 - 02/26 0700 In: 39 [I.V.:39] Out: -  Intake/Output this shift: Total I/O In: 240 [P.O.:240] Out: 650 [Urine:650]  PE: General:A&O X 3 no compalints Heart:S1S2 RRR Lungs:clear without rales, rhonchi or wheezes Abd:+ BS, soft, non tender. Ext:no edema   Lab Results:  Basename 08/19/11 0500 08/18/11 0806  WBC 9.4 --  HGB 15.5 14.3  HCT 42.6 42.0  PLT 197 --   BMET  Basename 08/19/11 0500 08/18/11 0806  NA 138 138  K 3.9 3.7  CL 105 102  CO2 23 --  GLUCOSE 165* 262*  BUN 9 9  CREATININE 0.90 0.80  CALCIUM 9.3 --    Basename 08/19/11 0657 08/18/11 2105  TROPONINI <0.30 <0.30    Lab Results  Component Value Date   CHOL 150 08/19/2011   HDL 32* 08/19/2011   LDLCALC 83 08/19/2011   TRIG 177* 08/19/2011   CHOLHDL 4.7 08/19/2011   Lab Results  Component Value Date   HGBA1C 8.1* 08/18/2011     No results found for this basename: TSH    Hepatic Function Panel  Basename 08/19/11 0500  PROT 6.2  ALBUMIN 3.4*  AST 35  ALT 23  ALKPHOS 65  BILITOT 0.7  BILIDIR --  IBILI --    Basename 08/19/11 0500  CHOL 150   No results found for this basename: PROTIME in the last 72 hours  EKG: Orders placed during the hospital encounter of 08/18/11  . EKG 12-LEAD  . EKG 12-LEAD    Studies/Results: Cardiac Cath:  Mild non obstructive CAD; suspect transient coronary vasospasm to LAD improved with NTG leading to 2 mm J pt elevation V1 - V2 which resolved.  Medical therapy with nitrates/ statin. Needs evaluation for OSA.     Medications: I have reviewed the patient's current medications.    Marland Kitchen amLODipine  5 mg Oral Daily  . aspirin  81 mg Oral Daily  . atorvastatin  40 mg Oral q1800  . insulin aspart  0-5 Units Subcutaneous QHS  . insulin aspart  0-9 Units Subcutaneous TID WC  . metoprolol tartrate  12.5 mg Oral BID  . niacin  500 mg Oral QHS  . pantoprazole  40 mg Oral Daily   Assessment/Plan: Patient Active Problem List  Diagnoses  . Abnormal EKG, ST Elevation, secondary to coronary spasm but no MI  . Chest pain, secondary to coronary spasm  . Diabetes mellitus, Type 2  . Obesity   PLAN: Negative MI, + coronary spasm on cardiac cath.  ST elevation on EKG resolved. Ambulating without difficulty.  Diabetes, not well controlled, has been given info on free classes.   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    LOS: 2 days   INGOLD,LAURA R 08/20/2011, 1:50 PM  I have seen and examined the patient along with Cecilie Kicks, NP.  I have reviewed the chart, notes and new data.  I agree with Laura's note.  46 y/o man admitted under STEMI protocol - noted to have no obstructive CAD -  likely Coronary Vasospasm related.  ST segments back to baseline. Obesity with ? OSA., DM-2.  Key new complaints: notes Sx c/w OSA & possible ED. Key examination changes: normal exam besides obesity Key new findings / data: no new labs today. Tele - SR  PLAN: Discharge on CCB for vasospasm along with ASA & BB Niacin & statin for dyslipidemia Will need to restart metformin for DM on d/c  Will f/u with Dr. Leda Gauze --> plans for OP sleep study, & discussion re ? ED.  Total MD time with patient & chart: 20 min.  Leonie Man, M.D., M.S. [facility] [address] [place], [place]  [address]  [phone]  08/20/2011 3:33 PM

## 2011-08-23 NOTE — Discharge Summary (Signed)
Physician Discharge Summary  Patient ID: Jerome Shepherd MRN: [id number] DOB/AGE: 10-26-1965 46 y.o.  Admit date: 08/18/2011 Discharge date: 08/20/11  Discharge Diagnoses:  Principal Problem:  *Abnormal EKG, ST Elevation, secondary to coronary spasm but no MI Active Problems:  Chest pain, secondary to coronary spasm  Diabetes mellitus, Type 2  Obesity   Discharged Condition: good  Hospital Course: Patient is a 46 year old male with no prior history of coronary disease but a history of diabetes who developed substernal chest pain around 5 AM on 08/18/11 and presented to [facility]. Patient describes awakening with a severe midsternal discomfort. There was no radiation to his jaw or arms. There was no associated shortness of breath or nausea and vomiting. He did not have diaphoresis. He went and woke up his neighbor who drove in to the hospital. At [facility] his EKG suggested some anterior ST elevation. He was transferred to [facility] as a ST elevation MI. Patient was taken to the cath lab by Dr. Claiborne Billings. Please see his Note for complete details. The patient denies any recent exertional dyspnea or chest pain.  Cardiac catheterization with mild nonobstructive disease suspected transient coronary vasospasm to the LAD improved with nitroglycerin.  Patient was admitted to the 2900 unit, cardiac enzymes were done which were all negative.  Patient was placed on statins and Niaspan.  He is also on a beta blocker as well amlodipine.  The patient does have erectile dysfunction and is concerned that the beta blocker may cause a problem for him. In the past he has used Viagra he will discuss this with Dr. Claiborne Billings on his office visit. He was instructed not to use Viagra until he saw Dr. Claiborne Billings.   He will hold his metformin for another 24 hours before resuming.  Prior to discharge he ambulated without complications he was cleared to return to work on Monday. Work note was given to him. He was seen  and evaluated by Dr. Ellyn Hack and felt to be stable and discharged home.   Consults: None  Significant Diagnostic Studies:  Procedures:      Cardiac catheterization 08/18/2011: Mild non obstructive CAD; suspect transient coronary vasospasm to LAD improved with NTG leading to 2 mm J pt elevation V1 - V2 which resolved.  Medical therapy with nitrates/ statin. Needs evaluation for OSA.   Laboratory data sodium 138 potassium 3.9 chloride 105 CO2 20 3 AM and 9 creatinine 0.90 calcium 9.3 glucose 165 alkaline phosphatase 25 albumin 3.4 AST 35 ALT 23  Cardiac enzymes were negative Total cholesterol 150 triglycerides 177 HDL 32 LDL 83  Hemoglobin 15.5 hematocrit 42.6 the BBC 9.4 platelets 197     Discharge Exam: Blood pressure 147/62, pulse 56, temperature 97.8 F (36.6 C), temperature source Oral, resp. rate 18, height 6' 4"  (1.93 m), weight 143 kg (315 lb 4.1 oz), SpO2 94.00%.   General:A&O X 3 no compalints  Heart:S1S2 RRR  Lungs:clear without rales, rhonchi or wheezes  Abd:+ BS, soft, non tender.  Ext:no edema  Disposition: 01-Home or Self Care   Medication List  As of 08/23/2011  7:41 PM   TAKE these medications         ADVIL 200 MG tablet   Generic drug: ibuprofen   Take 200 mg by mouth every 6 (six) hours as needed. For pain/headaches.      amLODipine 5 MG tablet   Commonly known as: NORVASC   Take 1 tablet (5 mg total) by mouth daily.  aspirin 81 MG chewable tablet   Chew 1 tablet (81 mg total) by mouth daily.      atorvastatin 40 MG tablet   Commonly known as: LIPITOR   Take 1 tablet (40 mg total) by mouth daily at 6 PM.      metFORMIN 500 MG tablet   Commonly known as: GLUCOPHAGE   Take 1,000 mg by mouth 2 (two) times daily with a meal.      metoprolol tartrate 25 MG tablet   Commonly known as: LOPRESSOR   Take 0.5 tablets (12.5 mg total) by mouth 2 (two) times daily.      niacin 500 MG CR tablet   Commonly known as: NIASPAN   Take 1 tablet (500 mg total)  by mouth at bedtime.      nitroGLYCERIN 0.4 MG SL tablet   Commonly known as: NITROSTAT   Place 1 tablet (0.4 mg total) under the tongue every 5 (five) minutes as needed for chest pain.           Follow-up Information    Follow up with Troy Sine, MD. (Our office will call you with date and time at the [facility] office   [address], [phone].)    Contact information:   [address] [place] [phone]        Discharge Instructions:  Take your daily Asprin 30 minutes prior to Niaspan, and take the Niaspan with low fat crackers or snack.  No viagra until you see Dr. Claiborne Billings.  We will call to schedule a sleep study for March.   SignedIsaiah Serge 08/23/2011, 7:41 PM  I saw [name] on the AM of discharge. He has not had any more chest pain & ECG has normalized.  He is ready for discharge - plan to ROV with Dr. Leda Gauze & consider OP sleep study.  I agree with Laura's note above.  Leonie Man, M.D., M.S. [facility] [address] [place], [place]  [address]  [phone]  08/23/2011 8:17 PM

## 2013-01-01 ENCOUNTER — Emergency Department (HOSPITAL_COMMUNITY): Payer: No Typology Code available for payment source

## 2013-01-01 ENCOUNTER — Emergency Department (HOSPITAL_COMMUNITY)
Admission: EM | Admit: 2013-01-01 | Discharge: 2013-01-01 | Disposition: A | Discharge status: Home / Self Care | Payer: No Typology Code available for payment source | Attending: Emergency Medicine | Admitting: Emergency Medicine

## 2013-01-01 ENCOUNTER — Encounter (HOSPITAL_COMMUNITY): Payer: Self-pay | Admitting: Physical Medicine and Rehabilitation

## 2013-01-01 DIAGNOSIS — R1013 Epigastric pain: Secondary | ICD-10-CM | POA: Insufficient documentation

## 2013-01-01 DIAGNOSIS — M25519 Pain in unspecified shoulder: Secondary | ICD-10-CM | POA: Insufficient documentation

## 2013-01-01 DIAGNOSIS — I252 Old myocardial infarction: Secondary | ICD-10-CM | POA: Insufficient documentation

## 2013-01-01 DIAGNOSIS — R0789 Other chest pain: Secondary | ICD-10-CM | POA: Insufficient documentation

## 2013-01-01 DIAGNOSIS — R9431 Abnormal electrocardiogram [ECG] [EKG]: Secondary | ICD-10-CM | POA: Diagnosis present

## 2013-01-01 DIAGNOSIS — K219 Gastro-esophageal reflux disease without esophagitis: Secondary | ICD-10-CM

## 2013-01-01 DIAGNOSIS — R110 Nausea: Secondary | ICD-10-CM | POA: Insufficient documentation

## 2013-01-01 DIAGNOSIS — I2510 Atherosclerotic heart disease of native coronary artery without angina pectoris: Secondary | ICD-10-CM

## 2013-01-01 DIAGNOSIS — Z79899 Other long term (current) drug therapy: Secondary | ICD-10-CM | POA: Insufficient documentation

## 2013-01-01 DIAGNOSIS — E669 Obesity, unspecified: Secondary | ICD-10-CM

## 2013-01-01 DIAGNOSIS — E119 Type 2 diabetes mellitus without complications: Secondary | ICD-10-CM | POA: Insufficient documentation

## 2013-01-01 DIAGNOSIS — Z87891 Personal history of nicotine dependence: Secondary | ICD-10-CM | POA: Insufficient documentation

## 2013-01-01 DIAGNOSIS — R079 Chest pain, unspecified: Secondary | ICD-10-CM | POA: Diagnosis present

## 2013-01-01 HISTORY — DX: Atherosclerotic heart disease of native coronary artery without angina pectoris: I25.10

## 2013-01-01 HISTORY — DX: Angina pectoris with documented spasm: I20.1

## 2013-01-01 LAB — COMPREHENSIVE METABOLIC PANEL
ALT: 20 U/L (ref 0–53)
AST: 31 U/L (ref 0–37)
Albumin: 4 g/dL (ref 3.5–5.2)
Alkaline Phosphatase: 72 U/L (ref 39–117)
BUN: 15 mg/dL (ref 6–23)
CO2: 25 mEq/L (ref 19–32)
Calcium: 10 mg/dL (ref 8.4–10.5)
Chloride: 100 mEq/L (ref 96–112)
Creatinine, Ser: 0.95 mg/dL (ref 0.50–1.35)
GFR calc Af Amer: >90 mL/min (ref >90)
GFR calc non Af Amer: >90 mL/min (ref >90)
Glucose, Bld: 155 mg/dL — ABNORMAL HIGH (ref 70–99)
Potassium: 4.1 mEq/L (ref 3.5–5.1)
Sodium: 137 mEq/L (ref 135–145)
Total Bilirubin: 0.3 mg/dL (ref 0.3–1.2)
Total Protein: 7 g/dL (ref 6.0–8.3)

## 2013-01-01 LAB — LIPASE, BLOOD: Lipase: 38 U/L (ref 11–59)

## 2013-01-01 LAB — CBC WITH DIFFERENTIAL/PLATELET
Basophils Absolute: 0.1 10*3/uL (ref 0.0–0.1)
Lymphocytes Relative: 32 % (ref 12–46)
Neutro Abs: 4.6 10*3/uL (ref 1.7–7.7)
Platelets: 231 10*3/uL (ref 150–400)
RBC: 4.97 MIL/uL (ref 4.22–5.81)
RDW: 12.2 % (ref 11.5–15.5)
WBC: 8 10*3/uL (ref 4.0–10.5)

## 2013-01-01 LAB — POCT I-STAT TROPONIN I

## 2013-01-01 MED ORDER — NITROGLYCERIN 0.4 MG SL SUBL: 0.4000 mg | SUBLINGUAL_TABLET | Freq: Once | SUBLINGUAL | Status: DC

## 2013-01-01 MED ORDER — RANITIDINE HCL 150 MG PO TABS: 150.0000 mg | ORAL_TABLET | Freq: Two times a day (BID) | ORAL | Status: DC

## 2013-01-01 MED ORDER — ASPIRIN 81 MG PO CHEW: 81.0000 mg | CHEWABLE_TABLET | Freq: Every day | ORAL | Status: DC

## 2013-01-01 MED ORDER — OMEPRAZOLE 20 MG PO CPDR: 20.0000 mg | DELAYED_RELEASE_CAPSULE | Freq: Every day | ORAL | Status: DC

## 2013-01-01 MED ORDER — GI COCKTAIL ~~LOC~~
30.0000 mL | Freq: Once | ORAL | Status: AC
  Administered 2013-01-01: 30 mL via ORAL
  Filled 2013-01-01: qty 30

## 2013-01-01 NOTE — ED Notes (Signed)
Pt discharged.Vital signs stable and GCS 15 

## 2013-01-01 NOTE — Consult Note (Signed)
Reason for Consult: chest pain  Referring Physician: ER MD   No primary provider on file. Primary Cardiologist: Dr. Lajoyce Corners is an 47 y.o. male.    Chief Complaint: lower sternum to epigastric pain, and lt shoulder pain, different than with coronary spasm.   HPI: Jerome Shepherd present to ED with 2 week hx on intermittent epigastric area pain, Lt shoulder pain and radiation to below elbow- heat like pain.  No associated SOB, nausea and he is frequently diaphoretic with or without pain.   Worked in the yard in hot weather last weekend without change in the symptoms.  Cardiac cath 2013 when seen for "STEMI" though minimal non obstructive CAD, no MI,  And + coronary spasm.  Pt did not follow up.  He is diabetic and has family hx of CAD on his father's side with several cousins who have died with MIs.    Additionally, Dr Claiborne Billings recommended sleep study, but pt has been hesitant to do.  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  Mostly he is at home at night.   Has not taken his NTG.  EKG SR with ST depression in III, no acute changes from last year.   Past Medical History  Diagnosis Date  . Diabetes mellitus   . MI (myocardial infarction)     non obstructive CAD by cath ( no MI in 2013) just abnormal EKG but enzymes negative  . Sleep apnea   . Coronary artery spasm 2013  . CAD (coronary artery disease), minimal, non obstructive 2013 01/01/2013    Past Surgical History  Procedure Laterality Date  . No past surgeries    . Cardiac catheterization  2013    non obstructive CAD  ~20%    Family History  Problem Relation Age of Onset  . Diabetes type II Sister   . Heart attack Cousin    Social History:  reports that he quit smoking about 17 years ago. He does not have any smokeless tobacco history on file. He reports that he does not drink alcohol or use illicit drugs. Truck Geophysicist/field seismologist, Probation officer.  Overnight.    Allergies:  Allergies  Allergen Reactions  . Codeine Nausea Only  . Sulfa Antibiotics Nausea Only    OUTPATIENT Medications: Glyburide 6 mg daily Advil prn prinivil 10 mg daily glucophage 500 mg daily Niaspan 500 mg at HS NTG prn    Results for orders placed during the hospital encounter of 01/01/13 (from the past 48 hour(s))  CBC WITH DIFFERENTIAL     Status: Abnormal   Collection Time    01/01/13 11:45 AM      Result Value Range   WBC 8.0  4.0 - 10.5 K/uL   RBC 4.97  4.22 - 5.81 MIL/uL   Hemoglobin 15.9  13.0 - 17.0 g/dL   HCT 43.1  39.0 - 52.0 %   MCV 86.7  78.0 - 100.0 fL   MCH 32.0  26.0 - 34.0 pg   MCHC 36.9 (*) 30.0 - 36.0 g/dL   RDW 12.2  11.5 - 15.5 %   Platelets 231  150 - 400 K/uL   Neutrophils Relative % 58  43 - 77 %   Neutro Abs 4.6  1.7 - 7.7 K/uL   Lymphocytes Relative 32  12 - 46 %   Lymphs Abs 2.6  0.7 -  4.0 K/uL   Monocytes Relative 7  3 - 12 %   Monocytes Absolute 0.5  0.1 - 1.0 K/uL   Eosinophils Relative 3  0 - 5 %   Eosinophils Absolute 0.2  0.0 - 0.7 K/uL   Basophils Relative 1  0 - 1 %   Basophils Absolute 0.1  0.0 - 0.1 K/uL  COMPREHENSIVE METABOLIC PANEL     Status: Abnormal   Collection Time    01/01/13 11:45 AM      Result Value Range   Sodium 137  135 - 145 mEq/L   Potassium 4.1  3.5 - 5.1 mEq/L   Chloride 100  96 - 112 mEq/L   CO2 25  19 - 32 mEq/L   Glucose, Bld 155 (*) 70 - 99 mg/dL   BUN 15  6 - 23 mg/dL   Creatinine, Ser 0.95  0.50 - 1.35 mg/dL   Calcium 10.0  8.4 - 10.5 mg/dL   Total Protein 7.0  6.0 - 8.3 g/dL   Albumin 4.0  3.5 - 5.2 g/dL   AST 31  0 - 37 U/L   ALT 20  0 - 53 U/L   Alkaline Phosphatase 72  39 - 117 U/L   Total Bilirubin 0.3  0.3 - 1.2 mg/dL   GFR calc non Af Amer >90  >90 mL/min   GFR calc Af Amer >90  >90 mL/min   Comment:            The eGFR has been calculated     using the CKD EPI equation.     This calculation has not been     validated in all clinical     situations.      eGFR's persistently     <90 mL/min signify     possible Chronic Kidney Disease.  LIPASE, BLOOD     Status: None   Collection Time    01/01/13 11:45 AM      Result Value Range   Lipase 38  11 - 59 U/L  POCT I-STAT TROPONIN I     Status: None   Collection Time    01/01/13 11:59 AM      Result Value Range   Troponin i, poc 0.00  0.00 - 0.08 ng/mL   Comment 3            Comment: Due to the release kinetics of cTnI,     a negative result within the first hours     of the onset of symptoms does not rule out     myocardial infarction with certainty.     If myocardial infarction is still suspected,     repeat the test at appropriate intervals.  TROPONIN I     Status: None   Collection Time    01/01/13  2:45 PM      Result Value Range   Troponin I <0.30  <0.30 ng/mL   Comment:            Due to the release kinetics of cTnI,     a negative result within the first hours     of the onset of symptoms does not rule out     myocardial infarction with certainty.     If myocardial infarction is still suspected,     repeat the test at appropriate intervals.   Dg Chest 2 View  01/01/2013   *RADIOLOGY REPORT*  Clinical Data: Chest pain including the left shoulder and upper abdomen.  CHEST -  2 VIEW  Comparison: None.  Findings: Mild limitation: the posterior costophrenic sulci are not imaged in the lateral projection.  No significant osseous abnormality.  Lungs are clear. No effusion or pneumothorax. Cardiomediastinal size and contour are within normal limits.  The upper abdomen is unremarkable.  IMPRESSION: No evidence of acute cardiopulmonary disease.   Original Report Authenticated By: Jorje Guild    ROS: General:no colds or fevers, no weight changes Skin:no rashes or ulcers HEENT:no blurred vision, no congestion CV:see HPI PUL:see HPI GI:no diarrhea constipation, no indigestion, one episode of bright blood with stool last week, none since GU:no hematuria, no dysuria MS:no joint pain, no  claudication Neuro:no syncope, no lightheadedness Endo:+ diabetes has been controlled when he eats appropriately, no thyroid disease   Blood pressure 120/53, pulse 71, temperature 98.6 F (37 C), temperature source Oral, resp. rate 15, SpO2 95.00%. PE: General:Pleasant affect, NAD Skin:Warm and dry, brisk capillary refill HEENT:normocephalic, sclera clear, mucus membranes moist Neck:supple, no JVD, no bruits  Heart:S1S2 RRR without murmur, gallup, rub or click Lungs:clear without rales, rhonchi, or wheezes HYQ:MVHQ, non tender, + BS, do not palpate liver spleen or masses Ext:no lower ext edema, 2+ pedal pulses, 2+ radial pulses Neuro:alert and oriented, MAE, follows commands, + facial symmetry    Assessment/Plan Principal Problem:   Chest pain Active Problems:   Abnormal EKG, ST Elevation, secondary to coronary spasm but no MI, 2013   Diabetes mellitus   Obesity   CAD (coronary artery disease), minimal, non obstructive 2013  PLAN: Will try NTG once if no relief will try GI cocktail, nonobstructive CAD last year with coronary spasm.  Troponin negative.  May be combination of  Cervical disc disease causing lt shoulder pain and GERD.  MD to see.  [place] Practitioner Certified [facility] Pager [phone] 01/01/2013, 4:03 PM

## 2013-01-01 NOTE — ED Notes (Signed)
This patients ekg was not done in triage- as protocol.

## 2013-01-01 NOTE — ED Provider Notes (Signed)
History    CSN: [id number] Arrival date & time 01/01/13  1029  First MD Initiated Contact with Patient 01/01/13 1125     Chief Complaint  Patient presents with  . Chest Pain  . Abdominal Pain   (Consider location/radiation/quality/duration/timing/severity/associated sxs/prior Treatment) HPI Comments: Pt with hx MI, PUD, DM, p/w epigastric pain, left sided chest pain, and left shoulder pain that have been intermittent for the past 2 weeks.  Radiation down left arm, described as burning.  Pain has been intermittent.  Pain in left shoulder is dull. It is not exertional.  Epigastric pain is worse after eating.  Lasts 6-7 hours at a time, waxing and waning while it is.  Has been burping more recently, has had nausea and bloating.  Takes tagamet occasionally for nausea.   Denies fevers, chills, cough, SOB, change in bowel habits (baseline has alternating loose/solid stools, sometimes daily, sometimes every 2-3 days), dysuria, frequency, urgency.    Patient is a 47 y.o. male presenting with chest pain and abdominal pain. The history is provided by the patient.  Chest Pain Associated symptoms: abdominal pain   Associated symptoms: no cough, no fever, no nausea, no palpitations, no shortness of breath and not vomiting   Abdominal Pain Associated symptoms include abdominal pain and chest pain. Pertinent negatives include no chills, coughing, fever, nausea or vomiting.   Past Medical History  Diagnosis Date  . Diabetes mellitus   . MI (myocardial infarction)   . Sleep apnea    Past Surgical History  Procedure Laterality Date  . No past surgeries     History reviewed. No pertinent family history. History  Substance Use Topics  . Smoking status: Former Smoker    Quit date: 08/18/1995  . Smokeless tobacco: Not on file  . Alcohol Use: No    Review of Systems  Constitutional: Negative for fever and chills.  Respiratory: Negative for cough and shortness of breath.   Cardiovascular:  Positive for chest pain. Negative for palpitations and leg swelling.  Gastrointestinal: Positive for abdominal pain. Negative for nausea, vomiting and diarrhea.  Genitourinary: Negative for dysuria, urgency and frequency.    Allergies  Codeine and Sulfa antibiotics  Home Medications   Current Outpatient Rx  Name  Route  Sig  Dispense  Refill  . glyBURIDE micronized (GLYNASE) 6 MG tablet   Oral   Take 6 mg by mouth daily.         Marland Kitchen ibuprofen (ADVIL) 200 MG tablet   Oral   Take 200 mg by mouth every 6 (six) hours as needed. For pain/headaches.         Marland Kitchen lisinopril (PRINIVIL,ZESTRIL) 10 MG tablet   Oral   Take 10 mg by mouth daily.         . metFORMIN (GLUCOPHAGE) 500 MG tablet   Oral   Take 500 mg by mouth 2 (two) times daily.          . niacin (NIASPAN) 500 MG CR tablet   Oral   Take 1 tablet (500 mg total) by mouth at bedtime.   30 tablet   11     Take your asprin 30 min. Prior to the niaspan.   . nitroGLYCERIN (NITROSTAT) 0.4 MG SL tablet   Sublingual   Place 1 tablet (0.4 mg total) under the tongue every 5 (five) minutes as needed for chest pain.   25 tablet   4    BP 153/76  Pulse 76  Temp(Src) 98.6 F (37 C) (  Oral)  Resp 14  SpO2 98% Physical Exam  Nursing note and vitals reviewed. Constitutional: He appears well-developed and well-nourished. No distress.  HENT:  Head: Normocephalic and atraumatic.  Neck: Neck supple.  Cardiovascular: Normal rate and regular rhythm.   Pulmonary/Chest: Effort normal and breath sounds normal. No respiratory distress. He has no wheezes. He has no rales. He exhibits no tenderness.  Abdominal: Soft. He exhibits no distension and no mass. There is no tenderness. There is no rebound and no guarding.  obese  Neurological: He is alert. He exhibits normal muscle tone.  Skin: He is not diaphoretic.    ED Course  Procedures (including critical care time) Labs Reviewed  CBC WITH DIFFERENTIAL - Abnormal; Notable for the  following:    MCHC 36.9 (*)    All other components within normal limits  COMPREHENSIVE METABOLIC PANEL - Abnormal; Notable for the following:    Glucose, Bld 155 (*)    All other components within normal limits  LIPASE, BLOOD  POCT I-STAT TROPONIN I   Dg Chest 2 View  01/01/2013   *RADIOLOGY REPORT*  Clinical Data: Chest pain including the left shoulder and upper abdomen.  CHEST - 2 VIEW  Comparison: None.  Findings: Mild limitation: the posterior costophrenic sulci are not imaged in the lateral projection.  No significant osseous abnormality.  Lungs are clear. No effusion or pneumothorax. Cardiomediastinal size and contour are within normal limits.  The upper abdomen is unremarkable.  IMPRESSION: No evidence of acute cardiopulmonary disease.   Original Report Authenticated By: Tiburcio Pea    Date: 01/01/2013  Rate: 67  Rhythm: normal sinus rhythm  QRS Axis: normal  Intervals: normal  ST/T Wave abnormalities: normal  Conduction Disutrbances:none  Narrative Interpretation:   Old EKG Reviewed: unchanged   1. Chest pain   2. Epigastric pain     MDM  Pt with hx MI and GERD, p/w both left chest and left shoulder pain with radiation down left arm as well as epigastric pain, bloating, nausea.  It is unclear if these are related.  Labs and EKG, CXR unremarkable.  Pt also seen by Dr Rhunette Croft.  Plan is to discuss patient with cardiology [facility], Dr Tresa Endo).     Patient signed out to Dr Rhunette Croft who will disposition patient pending discussion with cardiology and patient reassessment.       [place], PA-C 01/02/13 [phone]

## 2013-01-01 NOTE — Consult Note (Signed)
Pt. Seen and examined. Agree with the NP/PA-C note as written.  47 yo male with history of ?STEMI last year - cath showed normal coronary arteries (or perhaps mild CAD). He does have cardiac risk factors, mostly lifestyle and diet related. Has been eating more spicy food recently, has a history of peptic ulcers in the past. Pain is mid-epigastric. Some radiation to left should and down left arm. Some improvement with GI cocktail. Pain is not exertional, present for weeks. Troponin initially is negative. EKG is non-ischemic. During his previous visit, EKG's here were normal (reported ST elevation at OSH) - enzymes remained negative. I'm dubious that he had coronary spasm at all.  Would recommend starting a daily PPI. Ok to d/c home from our standpoint. Follow-up with Dr. Claiborne Billings in the office in 2-3 weeks.  Pixie Casino, MD, [facility] Attending Cardiologist [facility]

## 2013-01-01 NOTE — ED Notes (Signed)
Pt presents to department for evaluation of L sided chest pain radiating to L shoulder and arm. Also states epigastric pain that becomes worse after eating. Symptoms ongoing x2 weeks. 4/10 epigastric pain at the time. Denies chest pain at present. History of MI and sleep apnea. Pt is conscious alert and oriented x4. Skin warm and dry.

## 2013-01-01 NOTE — ED Provider Notes (Signed)
6:32 PM Patient was seen and evaluated by cardiology and was deemed to be safe for discharge home.  Please see consultation note for complete details.  Hoy Morn, MD 01/01/13 [phone]

## 2013-01-01 NOTE — ED Notes (Signed)
Cardiologist at bedside.  

## 2013-01-03 NOTE — ED Provider Notes (Signed)
Medical screening examination/treatment/procedure(s) were conducted as a shared visit with non-physician practitioner(s) and myself.  I personally evaluated the patient during the encounter.  Pt with chest pain. Typical sx, low cardiac risk factors. Has had vasospasms before that led to cath. Called Cards to set up an appt, they preferred to see the patients.   Varney Biles, MD 01/03/13 [phone]

## 2013-01-22 ENCOUNTER — Ambulatory Visit (INDEPENDENT_AMBULATORY_CARE_PROVIDER_SITE_OTHER): Payer: No Typology Code available for payment source | Admitting: Cardiology

## 2013-01-22 ENCOUNTER — Encounter: Payer: Self-pay | Admitting: Cardiology

## 2013-01-22 VITALS — BP 146/82 | HR 81 | Ht 76.0 in | Wt 319.6 lb

## 2013-01-22 DIAGNOSIS — R079 Chest pain, unspecified: Secondary | ICD-10-CM

## 2013-01-22 DIAGNOSIS — E119 Type 2 diabetes mellitus without complications: Secondary | ICD-10-CM

## 2013-01-22 DIAGNOSIS — R1314 Dysphagia, pharyngoesophageal phase: Secondary | ICD-10-CM

## 2013-01-22 DIAGNOSIS — R1310 Dysphagia, unspecified: Secondary | ICD-10-CM

## 2013-01-22 DIAGNOSIS — I10 Essential (primary) hypertension: Secondary | ICD-10-CM | POA: Insufficient documentation

## 2013-01-22 DIAGNOSIS — I2510 Atherosclerotic heart disease of native coronary artery without angina pectoris: Secondary | ICD-10-CM

## 2013-01-22 DIAGNOSIS — K219 Gastro-esophageal reflux disease without esophagitis: Secondary | ICD-10-CM

## 2013-01-22 DIAGNOSIS — F529 Unspecified sexual dysfunction not due to a substance or known physiological condition: Secondary | ICD-10-CM

## 2013-01-22 DIAGNOSIS — N539 Unspecified male sexual dysfunction: Secondary | ICD-10-CM | POA: Insufficient documentation

## 2013-01-22 MED ORDER — OMEPRAZOLE 20 MG PO CPDR: 40.0000 mg | DELAYED_RELEASE_CAPSULE | Freq: Every day | ORAL | Status: DC

## 2013-01-22 MED ORDER — AMLODIPINE BESYLATE 5 MG PO TABS: 5.0000 mg | ORAL_TABLET | Freq: Every day | ORAL | Status: DC

## 2013-01-22 NOTE — Assessment & Plan Note (Signed)
He is on AndroGel currently, he uses every other day. I instructed him to need to get refills from his primary care physician

## 2013-01-22 NOTE — Patient Instructions (Signed)
Stop Tagamet  Begin Prilosec 40 mg daily  We will arrange for you to see a gastroenterologist in [facility]  Stop lisinopril, stopped Niaspan. Begin amlodipine 5 mg daily for your blood pressure.  Caffeine will make your GI symptoms worse.  It is better to keep 3 smaller meals a day than 2 large meals.  Followup with Dr. Tresa Endo or myself in 2 months to ensure symptoms have resolved

## 2013-01-22 NOTE — Assessment & Plan Note (Signed)
History of mild increase in troponin with minimal coronary artery disease.  Thought perhaps there was coronary spasm at the time of the cath. Recent emergency room visit for pain most likely related to GI issues.

## 2013-01-22 NOTE — Assessment & Plan Note (Signed)
I have stopped his Niaspan for now, this may be precipitating some GERD.

## 2013-01-22 NOTE — Progress Notes (Signed)
01/22/2013   PCP: Ignatius Specking., MD   Chief Complaint  Patient presents with  . Follow-up    recent ER visit due to chest pressure.  ER doctor told him it was probably Reflux and started him on Omeprazole.  Since his symptoms are not as bad.Stopped Lisinopril due to problems with impotence about a week ago.    Primary Cardiologist: Dr. Tresa Endo  HPI:  47 year old white married male presented to the emergency room until all evidence with intermittent epigastric pain with some left shoulder pain also no associated symptoms of shortness of breath or nausea. He does have a history of coronary disease with cardiac catheterization in 2013, no myocardial infarction, and possible coronary spasm during cardiac catheterization.   His lipids at that time cholesterol was 150 triglycerides 177 HDL 32 and LDL 83. He had been placed on Niaspan.  Today he is here for followup. The only pain he has is in his throat, after he lies down at night after eating a full meal.  He will also experience brash regurg at night.  He also has problems swallowing solids- he has to drink a lot of fluid to get the food to go down his esophagus.  With this discomfort he has stopped drinking his 5-6 "Sundrops" a day which has improved his symptoms somewhat.    He was instructed in the emergency room to use Prilosec but he has not yet started he is only taking Tagamet.  We discussed multiple interactions with Tagamet and other medications and it would be better to use Prilosec which not only as an antacid but will help prevent reflux.  No cardiac sounding chest pain at all today no shortness of breath.  Allergies  Allergen Reactions  . Codeine Nausea Only  . Sulfa Antibiotics Nausea Only    Current Outpatient Prescriptions  Medication Sig Dispense Refill  . diphenhydrAMINE (BENADRYL) 50 MG capsule Take 50 mg by mouth at bedtime as needed for itching.      . glyBURIDE micronized (GLYNASE) 6 MG tablet Take 6 mg by  mouth daily.      Marland Kitchen ibuprofen (ADVIL) 200 MG tablet Take 200 mg by mouth every 6 (six) hours as needed. For pain/headaches.      . metFORMIN (GLUCOPHAGE) 500 MG tablet Take 500 mg by mouth daily with breakfast.       . nitroGLYCERIN (NITROSTAT) 0.4 MG SL tablet Place 1 tablet (0.4 mg total) under the tongue every 5 (five) minutes as needed for chest pain.  25 tablet  4  . omeprazole (PRILOSEC) 20 MG capsule Take 2 capsules (40 mg total) by mouth daily.  60 capsule  1  . ranitidine (ZANTAC) 150 MG tablet Take 1 tablet (150 mg total) by mouth 2 (two) times daily.  60 tablet  0  . testosterone (ANDROGEL) 50 MG/5GM GEL Place 5 g onto the skin daily.      Marland Kitchen amLODipine (NORVASC) 5 MG tablet Take 1 tablet (5 mg total) by mouth daily.  30 tablet  6  . aspirin 81 MG chewable tablet Chew 1 tablet (81 mg total) by mouth daily.  30 tablet  0   No current facility-administered medications for this visit.    Past Medical History  Diagnosis Date  . Diabetes mellitus   . MI (myocardial infarction)     non obstructive CAD by cath ( no MI in 2013) just abnormal EKG but enzymes negative  . Sleep apnea   .  Coronary artery spasm 2013  . CAD (coronary artery disease), minimal, non obstructive 2013 01/01/2013    Past Surgical History  Procedure Laterality Date  . No past surgeries    . Cardiac catheterization  2013    non obstructive CAD  ~20%    EAV:[id number]:JY colds or fevers, no weight changes Skin:no rashes or ulcers, reddened area if he scratches himself  HEENT:no blurred vision, no congestion CV:see HPI PUL:see HPI GI:no diarrhea constipation or melena, no indigestion GU:no hematuria, no dysuria MS:no joint pain, no claudication Neuro:no syncope, no lightheadedness Endo:+ diabetes, no thyroid disease  PHYSICAL EXAM BP 146/82  Pulse 81  Ht 6\' 4"  (1.93 m)  Wt 319 lb 9.6 oz (144.97 kg)  BMI 38.92 kg/m2 General:Pleasant affect, NAD Skin:Warm and dry, brisk capillary  refill HEENT:normocephalic, sclera clear, mucus membranes moist Neck:supple, no JVD, no bruits  Heart:S1S2 RRR without murmur, gallup, rub or click Lungs:clear without rales, rhonchi, or wheezes NWG:NFAO, non tender, + BS, do not palpate liver spleen or masses Ext:no lower ext edema, 2+ pedal pulses, 2+ radial pulses Neuro:alert and oriented, MAE, follows commands, + facial symmetry  EKG:SR, normal EKG.  ASSESSMENT AND PLAN Esophageal dysphagia, with solids only, leading to chest pain We will arrange for the patient to see gastroenterologist. I have asked him to take Prilosec 40 mg daily and to stop Tagamet. We also discussed eating 3 smaller meals daily instead of 2 large meals. Also asked to decrease caffeine, he had already stopped soft drinks.  GERD (gastroesophageal reflux disease) I have stopped his Niaspan for now, this may be precipitating some GERD.    CAD (coronary artery disease), minimal, non obstructive 2013 History of mild increase in troponin with minimal coronary artery disease.  Thought perhaps there was coronary spasm at the time of the cath. Recent emergency room visit for pain most likely related to GI issues.  HTN (hypertension) Blood pressure elevated today for diabetic patient, he has not had lisinopril and overweight secondary to sexual dysfunction.  I have stopped his lisinopril and changed him to amlodipine 5 mg daily.  This could control blood pressure as well as prevent coronary spasm.  Diabetes mellitus Stable  Male sexual dysfunction He is on AndroGel currently, he uses every other day. I instructed him to need to get refills from his primary care physician   He'll have a followup with either Dr. Tresa Endo or myself in 2 months to ensure his symptoms have been treated and evaluated.  At that time I'll recheck his lipid panel.  Is off Niaspan and his GERD does not improve that we couldn't resume his Niaspan.

## 2013-01-22 NOTE — Assessment & Plan Note (Signed)
Stable

## 2013-01-22 NOTE — Assessment & Plan Note (Signed)
Blood pressure elevated today for diabetic patient, he has not had lisinopril and overweight secondary to sexual dysfunction.  I have stopped his lisinopril and changed him to amlodipine 5 mg daily.  This could control blood pressure as well as prevent coronary spasm.

## 2013-01-22 NOTE — Assessment & Plan Note (Signed)
We will arrange for the patient to see gastroenterologist. I have asked him to take Prilosec 40 mg daily and to stop Tagamet. We also discussed eating 3 smaller meals daily instead of 2 large meals. Also asked to decrease caffeine, he had already stopped soft drinks.

## 2013-03-18 ENCOUNTER — Encounter: Payer: Self-pay | Admitting: Internal Medicine

## 2013-05-27 ENCOUNTER — Ambulatory Visit: Payer: No Typology Code available for payment source | Admitting: Cardiovascular Disease

## 2013-05-28 ENCOUNTER — Encounter: Payer: Self-pay | Admitting: Cardiovascular Disease

## 2013-05-28 ENCOUNTER — Ambulatory Visit (INDEPENDENT_AMBULATORY_CARE_PROVIDER_SITE_OTHER): Payer: PRIVATE HEALTH INSURANCE | Admitting: Cardiovascular Disease

## 2013-05-28 VITALS — BP 145/84 | HR 91 | Ht 76.0 in | Wt 324.0 lb

## 2013-05-28 DIAGNOSIS — K219 Gastro-esophageal reflux disease without esophagitis: Secondary | ICD-10-CM

## 2013-05-28 DIAGNOSIS — Z79899 Other long term (current) drug therapy: Secondary | ICD-10-CM

## 2013-05-28 DIAGNOSIS — N539 Unspecified male sexual dysfunction: Secondary | ICD-10-CM

## 2013-05-28 DIAGNOSIS — I10 Essential (primary) hypertension: Secondary | ICD-10-CM

## 2013-05-28 DIAGNOSIS — G4730 Sleep apnea, unspecified: Secondary | ICD-10-CM

## 2013-05-28 DIAGNOSIS — E119 Type 2 diabetes mellitus without complications: Secondary | ICD-10-CM

## 2013-05-28 DIAGNOSIS — I2510 Atherosclerotic heart disease of native coronary artery without angina pectoris: Secondary | ICD-10-CM

## 2013-05-28 DIAGNOSIS — F529 Unspecified sexual dysfunction not due to a substance or known physiological condition: Secondary | ICD-10-CM

## 2013-05-28 DIAGNOSIS — R079 Chest pain, unspecified: Secondary | ICD-10-CM

## 2013-05-28 MED ORDER — SIMVASTATIN 40 MG PO TABS: 40.0000 mg | ORAL_TABLET | Freq: Every evening | ORAL | Status: DC

## 2013-05-28 MED ORDER — ISOSORBIDE MONONITRATE ER 30 MG PO TB24: 30.0000 mg | ORAL_TABLET | Freq: Every day | ORAL | Status: DC

## 2013-05-28 NOTE — Progress Notes (Signed)
Patient ID: Jerome Shepherd, male   DOB: 12/11/1965, 47 y.o.   MRN: [id number]       CARDIOLOGY CONSULT NOTE  Patient ID: Jerome Shepherd MRN: [id number] DOB/AGE: [birth year] 47 y.o.  Admit date: (Not on file) Primary Physician Glenda Chroman., MD  Reason for Consultation: chest pain  HPI: The patient is a 47 year old male with a past medical history of nonobstructive coronary artery disease (results noted below). He underwent cardiac catheterization for possible MI in February 2013 which revealed coronary vasospasm. He has a known history of gastroesophageal reflux disease and is currently taking both omeprazole and Protonix. He's also noted to have esophageal spasm. He also has diabetes and hypertension. Recent HbA1C 6.8%.  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 He also thinks having a CPAP machine both at home and in his truck would be too cumbersome. He has awoken himself from sleep due to his snoring. 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 He gets chest pains which are relieved with taking deep breaths. He does get chest pain with exertion but is also relieved with rest. He ate to fried sausage patties 2 weeks ago and he was awoken at night with severe heartburn. He denies palpitations, paroxysmal nocturnal dyspnea, orthopnea and leg swelling.  He also tells me that he is very sexually active and wants to know if he can take Viagra.     Allergies  Allergen Reactions  . Codeine Nausea Only  . Sulfa Antibiotics Nausea Only    Current Outpatient Prescriptions  Medication Sig Dispense Refill  . amLODipine (NORVASC) 5 MG tablet Take 1 tablet (5 mg total) by mouth daily.  30 tablet  6  . aspirin 81 MG chewable tablet Chew 1 tablet (81 mg total) by mouth daily.  30 tablet  0  .  diphenhydrAMINE (BENADRYL) 50 MG capsule Take 50 mg by mouth at bedtime as needed for itching.      . glyBURIDE micronized (GLYNASE) 6 MG tablet Take 6 mg by mouth daily.      Marland Kitchen ibuprofen (ADVIL) 200 MG tablet Take 200 mg by mouth every 6 (six) hours as needed. For pain/headaches.      . Magnesium Gluconate 27.5 MG TABS Take 1 tablet by mouth as needed.      . metFORMIN (GLUCOPHAGE) 500 MG tablet Take 500 mg by mouth daily with breakfast.       . omeprazole (PRILOSEC) 20 MG capsule Take 20 mg by mouth as needed.      . pantoprazole (PROTONIX) 40 MG tablet Take 40 mg by mouth daily.      . nitroGLYCERIN (NITROSTAT) 0.4 MG SL tablet Place 1 tablet (0.4 mg total) under the tongue every 5 (five) minutes as needed for chest pain.  25 tablet  4   No current facility-administered medications for this visit.    Past Medical History  Diagnosis Date  . Diabetes mellitus   . Sleep apnea   . Coronary artery spasm 2013  . CAD (coronary artery disease), minimal, non obstructive 2013 01/01/2013    Past Surgical History  Procedure Laterality Date  . No past surgeries    . Cardiac catheterization  2013    non obstructive CAD  ~20%  History   Social History  . Marital Status: Divorced    Spouse Name: N/A    Number of Children: N/A  . Years of Education: N/A   Occupational History  . Not on file.   Social History Main Topics  . Smoking status: Former Smoker    Quit date: 08/18/1995  . Smokeless tobacco: Not on file  . Alcohol Use: No  . Drug Use: No  . Sexual Activity: Not on file   Other Topics Concern  . Not on file   Social History Narrative   Divorced, no children, works as a Production designer, theatre/television/film.     Family History  Problem Relation Age of Onset  . Diabetes type II Sister   . Heart attack Cousin      Prior to Admission medications   Medication Sig Start Date End Date Taking? Authorizing Provider  amLODipine (NORVASC) 5 MG tablet Take 1 tablet (5 mg total) by mouth  daily. 01/22/13  Yes Cecilie Kicks, NP  aspirin 81 MG chewable tablet Chew 1 tablet (81 mg total) by mouth daily. 01/01/13  Yes Varney Biles, MD  diphenhydrAMINE (BENADRYL) 50 MG capsule Take 50 mg by mouth at bedtime as needed for itching.   Yes Historical Provider, MD  glyBURIDE micronized (GLYNASE) 6 MG tablet Take 6 mg by mouth daily.   Yes Historical Provider, MD  ibuprofen (ADVIL) 200 MG tablet Take 200 mg by mouth every 6 (six) hours as needed. For pain/headaches.   Yes Historical Provider, MD  Magnesium Gluconate 27.5 MG TABS Take 1 tablet by mouth as needed.   Yes Historical Provider, MD  metFORMIN (GLUCOPHAGE) 500 MG tablet Take 500 mg by mouth daily with breakfast.    Yes Historical Provider, MD  omeprazole (PRILOSEC) 20 MG capsule Take 20 mg by mouth as needed. 01/22/13  Yes Cecilie Kicks, NP  pantoprazole (PROTONIX) 40 MG tablet Take 40 mg by mouth daily.   Yes Historical Provider, MD  nitroGLYCERIN (NITROSTAT) 0.4 MG SL tablet Place 1 tablet (0.4 mg total) under the tongue every 5 (five) minutes as needed for chest pain. 08/20/11 01/22/13  Cecilie Kicks, NP     Review of systems complete and found to be negative unless listed above in HPI     Physical exam Blood pressure 145/84, pulse 91, height 6' 4"  (1.93 m), weight 324 lb (146.965 kg). General: NAD Neck: No JVD, no thyromegaly or thyroid nodule.  Lungs: Clear to auscultation bilaterally with normal respiratory effort. CV: Nondisplaced PMI.  Heart regular S1/S2, no S3/S4, no murmur.  No peripheral edema.  No carotid bruit.  Normal pedal pulses.  Abdomen: Soft, nontender, no hepatosplenomegaly, no distention.  Skin: Intact without lesions or rashes.  Neurologic: Alert and oriented x 3.  Psych: Normal affect. Extremities: No clubbing or cyanosis.  HEENT: Normal.   Labs:   Lab Results  Component Value Date   WBC 8.0 01/01/2013   HGB 15.9 01/01/2013   HCT 43.1 01/01/2013   MCV 86.7 01/01/2013   PLT 231 01/01/2013   No results  found for this basename: NA, K, CL, CO2, BUN, CREATININE, CALCIUM, LABALBU, PROT, BILITOT, ALKPHOS, ALT, AST, GLUCOSE,  in the last 168 hours Lab Results  Component Value Date   CKTOTAL 75 08/19/2011   CKMB 2.1 08/19/2011   TROPONINI <0.30 01/01/2013    Lab Results  Component Value Date   CHOL 150 08/19/2011   Lab Results  Component Value Date   HDL 32* 08/19/2011   Lab  Results  Component Value Date   LDLCALC 83 08/19/2011   Lab Results  Component Value Date   TRIG 177* 08/19/2011   Lab Results  Component Value Date   CHOLHDL 4.7 08/19/2011   No results found for this basename: LDLDIRECT       EKG: Sinus rhythm, rate 91 bpm, axis within normal limits, intervals within normal limits, no acute ST-T wave changes.  Studies: (Cath, Feb 2013) IMPRESSION:  1. Probable acute coronary syndrome, leading to transient coronary  vasospasm, relieved with nitroglycerin.  2. Mild, currently nonobstructive coronary artery disease, with 20-30%  proximal left anterior descending stenoses, 40% mid left anterior  descending narrowing, 40% circumflex narrowing, and scattered 30%  proximal to mid right coronary artery narrowing. The left system  narrowing were improved following IC nitroglycerin.  3. Normal left ventricular function with suggestion of left  ventricular hypertrophy without definitive wall motion abnormality.  ASSESSMENT AND PLAN:  1. 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 I will initiate Imdur 30 mg daily. I also gave him extensive counseling on therapeutic lifestyle changes which include diet and exercise. His ECG is normal. I warned him about the concomitant use of both Viagra and nitrates, and the potential deleterious effects of simultaneously taking both of these medications. 2. Nonobstructive CAD: At the present time, I will continue aspirin and start him on both Imdur 30 mg daily and simvastatin 40 mg daily as per ADA  recommendations, in order to halt the progression of obstructive coronary artery disease. I will followup with him in one month. At the present time, I will not pursue nuclear stress testing. 3. Sleep apnea: I strongly encouraged him to pursue a sleep study, informing him that sleep apnea is in risk factor for both myocardial infarction as well as atrial dysrhythmias. 4. HTN: I encouraged therapeutic lifestyle changes. I will not adjust his antihypertensives at this time. 5. Diabetes mellitus: relatively well controlled with recent HbA1C of 6.8%. 6. GERD: continue PPI's and monitor diet.  Dispo:   Signed: Kate Sable, M.D., F.A.C.C.  05/28/2013, 3:07 PM

## 2013-05-28 NOTE — Patient Instructions (Signed)
   Begin Imdur 30mg  daily  Begin Simvastatin 40mg  every evening  Continue all other medications.   Labs due in 1 month - around June 28, 2013 for FLP, LFT - Reminder:  Nothing to eat or drink after 12 midnight prior to labs.  Office will contact with results via phone or letter.   Follow up in  4-6 weeks

## 2013-07-16 ENCOUNTER — Ambulatory Visit: Payer: No Typology Code available for payment source | Admitting: Cardiovascular Disease

## 2013-08-23 ENCOUNTER — Ambulatory Visit (INDEPENDENT_AMBULATORY_CARE_PROVIDER_SITE_OTHER): Payer: PRIVATE HEALTH INSURANCE | Admitting: Cardiovascular Disease

## 2013-08-23 ENCOUNTER — Encounter: Payer: Self-pay | Admitting: Cardiovascular Disease

## 2013-08-23 VITALS — BP 151/93 | HR 74 | Ht 76.0 in | Wt 321.0 lb

## 2013-08-23 DIAGNOSIS — G4733 Obstructive sleep apnea (adult) (pediatric): Secondary | ICD-10-CM

## 2013-08-23 DIAGNOSIS — Z79899 Other long term (current) drug therapy: Secondary | ICD-10-CM

## 2013-08-23 DIAGNOSIS — I10 Essential (primary) hypertension: Secondary | ICD-10-CM

## 2013-08-23 DIAGNOSIS — R079 Chest pain, unspecified: Secondary | ICD-10-CM

## 2013-08-23 DIAGNOSIS — R0609 Other forms of dyspnea: Secondary | ICD-10-CM

## 2013-08-23 DIAGNOSIS — I2510 Atherosclerotic heart disease of native coronary artery without angina pectoris: Secondary | ICD-10-CM

## 2013-08-23 DIAGNOSIS — R5383 Other fatigue: Secondary | ICD-10-CM

## 2013-08-23 DIAGNOSIS — R5381 Other malaise: Secondary | ICD-10-CM

## 2013-08-23 DIAGNOSIS — R0989 Other specified symptoms and signs involving the circulatory and respiratory systems: Secondary | ICD-10-CM

## 2013-08-23 MED ORDER — AMLODIPINE BESYLATE 10 MG PO TABS: 10.0000 mg | ORAL_TABLET | Freq: Every day | ORAL | Status: DC

## 2013-08-23 NOTE — Patient Instructions (Signed)
   Increase Amlodipine to 10mg  daily - new sent to pharm (may take 2 tabs of your 5mg  tablet daily till finish current supply) Continue all other medications.   Referral to Dr. Redmond Pulling for sleep study Follow up in  1 month

## 2013-08-23 NOTE — Progress Notes (Signed)
Patient ID: Jerome Shepherd, male   DOB: 1965/12/08, 48 y.o.   MRN: [id number]      SUBJECTIVE: The patient is a 48 year old male with a past medical history of nonobstructive coronary artery disease and hypertension. He underwent cardiac catheterization for possible MI in February 2013 which revealed coronary vasospasm. He has a known history of gastroesophageal reflux disease and is currently taking both omeprazole and Protonix. He's also noted to have esophageal spasm. He also has diabetes, with a recent HbA1C of 6.8%.  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 He also thinks having a CPAP machine both at home and in his truck would be too cumbersome. He has awoken himself from sleep due to his snoring. 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 He gets chest pains which are relieved with taking deep breaths. He does get chest pain with exertion but is also relieved with rest. He sometimes experiences severe heartburn after eating sausage.  He denies palpitations, paroxysmal nocturnal dyspnea, orthopnea and leg swelling.  At his last visit, I started him on Imdur 30 mg daily and simvastatin 40 mg daily, as he appeared to be experiencing CCS class II anginal symptoms. For the most part, his chest pain has resolved. However, he has had 2 episodes over the past 2 days. He continues to have dyspnea on exertion and feels significantly fatigued. He says if he sits for long enough, he will fall asleep. He would like to be active and likes to be outdoors.   Allergies  Allergen Reactions  . Codeine Nausea Only  . Sulfa Antibiotics Nausea Only    Current Outpatient Prescriptions  Medication Sig Dispense Refill  . amLODipine (NORVASC) 5 MG tablet Take 1 tablet (5 mg total) by mouth daily.  30 tablet   6  . aspirin 81 MG chewable tablet Chew 1 tablet (81 mg total) by mouth daily.  30 tablet  0  . diphenhydrAMINE (BENADRYL) 50 MG capsule Take 50 mg by mouth at bedtime as needed for itching.      . glyBURIDE micronized (GLYNASE) 6 MG tablet Take 6 mg by mouth daily.      Marland Kitchen ibuprofen (ADVIL) 200 MG tablet Take 200 mg by mouth every 6 (six) hours as needed. For pain/headaches.      . isosorbide mononitrate (IMDUR) 30 MG 24 hr tablet Take 1 tablet (30 mg total) by mouth daily.  30 tablet  6  . Magnesium Gluconate 27.5 MG TABS Take 1 tablet by mouth as needed.      . metFORMIN (GLUCOPHAGE) 500 MG tablet Take 500 mg by mouth daily with breakfast.       . nitroGLYCERIN (NITROSTAT) 0.4 MG SL tablet Place 1 tablet (0.4 mg total) under the tongue every 5 (five) minutes as needed for chest pain.  25 tablet  4  . omeprazole (PRILOSEC) 20 MG capsule Take 20 mg by mouth as needed.      . pantoprazole (PROTONIX) 40 MG tablet Take 40 mg by mouth daily.      . simvastatin (ZOCOR) 40 MG tablet Take 1 tablet (40 mg total) by mouth every evening.  30 tablet  6   No current facility-administered medications for this visit.    Past  Medical History  Diagnosis Date  . Diabetes mellitus   . Sleep apnea   . Coronary artery spasm 2013  . CAD (coronary artery disease), minimal, non obstructive 2013 01/01/2013    Past Surgical History  Procedure Laterality Date  . No past surgeries    . Cardiac catheterization  2013    non obstructive CAD  ~20%    History   Social History  . Marital Status: Divorced    Spouse Name: N/A    Number of Children: N/A  . Years of Education: N/A   Occupational History  . Not on file.   Social History Main Topics  . Smoking status: Former Smoker    Quit date: 08/18/1995  . Smokeless tobacco: Not on file  . Alcohol Use: No  . Drug Use: No  . Sexual Activity: Not on file   Other Topics Concern  . Not on file   Social History Narrative   Divorced, no children, works as  a Production designer, theatre/television/film.     Filed Vitals:   08/23/13 1511  BP: 151/93  Pulse: 74  Height: 6' 4"  (1.93 m)  Weight: 321 lb (145.605 kg)  SpO2: 95%    PHYSICAL EXAM General: NAD Neck: No JVD, no thyromegaly. Lungs: Clear to auscultation bilaterally with normal respiratory effort. CV: Nondisplaced PMI.  Regular rate and rhythm, normal S1/S2, no S3/S4, no murmur. No pretibial or periankle edema.  No carotid bruit.  Normal pedal pulses.  Abdomen: Soft, nontender, no hepatosplenomegaly, no distention.  Neurologic: Alert and oriented x 3.  Psych: Normal affect. Extremities: No clubbing or cyanosis.   ECG: reviewed and available in electronic records.      ASSESSMENT AND PLAN: 1. Chest pain with exertion: his CCS class II angina appears to be controlled with the initiation of Imdur 30 mg daily. I gave him extensive counseling on therapeutic lifestyle changes which include diet and exercise. 2. Nonobstructive CAD: At the present time, I will continue aspirin and Imdur 30 mg daily, along with simvastatin 40 mg daily as per ADA recommendations, in order to halt the progression of obstructive coronary artery disease. I will followup with him in one month. At the present time, I will not pursue nuclear stress testing.  3. Sleep apnea: I strongly encouraged him to pursue a sleep study, informing him that sleep apnea is in risk factor for both myocardial infarction as well as atrial dysrhythmias. He is now willing to undergo this. 4. HTN: I encouraged therapeutic lifestyle changes. I will also increase amlodipine to 10 mg daily as it remains uncontrolled, which may also help with coronary vasospasm. 5. Diabetes mellitus: relatively well controlled with recent HbA1C of 6.8%.  6. GERD: continue PPI's and monitor diet.  Time spent: 40 minutes.  Kate Sable, M.D., F.A.C.C.

## 2013-09-21 ENCOUNTER — Ambulatory Visit: Payer: No Typology Code available for payment source | Admitting: Cardiovascular Disease

## 2013-09-24 ENCOUNTER — Encounter: Payer: Self-pay | Admitting: Cardiovascular Disease

## 2013-10-25 ENCOUNTER — Encounter: Payer: Self-pay | Admitting: Cardiovascular Disease

## 2013-11-25 ENCOUNTER — Emergency Department (HOSPITAL_COMMUNITY)
Admission: EM | Admit: 2013-11-25 | Discharge: 2013-11-25 | Disposition: A | Discharge status: Home / Self Care | Payer: No Typology Code available for payment source | Attending: Emergency Medicine | Admitting: Emergency Medicine

## 2013-11-25 ENCOUNTER — Telehealth: Payer: Self-pay | Admitting: Cardiovascular Disease

## 2013-11-25 ENCOUNTER — Encounter (HOSPITAL_COMMUNITY): Payer: Self-pay | Admitting: Emergency Medicine

## 2013-11-25 ENCOUNTER — Emergency Department (HOSPITAL_COMMUNITY): Payer: No Typology Code available for payment source

## 2013-11-25 DIAGNOSIS — Z87891 Personal history of nicotine dependence: Secondary | ICD-10-CM | POA: Insufficient documentation

## 2013-11-25 DIAGNOSIS — I2510 Atherosclerotic heart disease of native coronary artery without angina pectoris: Secondary | ICD-10-CM | POA: Insufficient documentation

## 2013-11-25 DIAGNOSIS — Z7982 Long term (current) use of aspirin: Secondary | ICD-10-CM | POA: Insufficient documentation

## 2013-11-25 DIAGNOSIS — I10 Essential (primary) hypertension: Secondary | ICD-10-CM | POA: Insufficient documentation

## 2013-11-25 DIAGNOSIS — K219 Gastro-esophageal reflux disease without esophagitis: Secondary | ICD-10-CM

## 2013-11-25 DIAGNOSIS — Z9889 Other specified postprocedural states: Secondary | ICD-10-CM | POA: Insufficient documentation

## 2013-11-25 DIAGNOSIS — Z79899 Other long term (current) drug therapy: Secondary | ICD-10-CM | POA: Insufficient documentation

## 2013-11-25 DIAGNOSIS — R071 Chest pain on breathing: Secondary | ICD-10-CM | POA: Insufficient documentation

## 2013-11-25 DIAGNOSIS — R079 Chest pain, unspecified: Secondary | ICD-10-CM

## 2013-11-25 DIAGNOSIS — E119 Type 2 diabetes mellitus without complications: Secondary | ICD-10-CM | POA: Insufficient documentation

## 2013-11-25 HISTORY — DX: Essential (primary) hypertension: I10

## 2013-11-25 LAB — CBC WITH DIFFERENTIAL/PLATELET
Basophils Absolute: 0.1 10*3/uL (ref 0.0–0.1)
Basophils Relative: 1 % (ref 0–1)
EOS ABS: 0.1 10*3/uL (ref 0.0–0.7)
EOS PCT: 2 % (ref 0–5)
HEMATOCRIT: 46 % (ref 39.0–52.0)
Hemoglobin: 16.4 g/dL (ref 13.0–17.0)
LYMPHS ABS: 3 10*3/uL (ref 0.7–4.0)
LYMPHS PCT: 31 % (ref 12–46)
MCH: 31.2 pg (ref 26.0–34.0)
MCHC: 35.7 g/dL (ref 30.0–36.0)
MCV: 87.6 fL (ref 78.0–100.0)
MONO ABS: 0.5 10*3/uL (ref 0.1–1.0)
MONOS PCT: 5 % (ref 3–12)
Neutro Abs: 5.9 10*3/uL (ref 1.7–7.7)
Neutrophils Relative %: 61 % (ref 43–77)
PLATELETS: 217 10*3/uL (ref 150–400)
RBC: 5.25 MIL/uL (ref 4.22–5.81)
RDW: 12.4 % (ref 11.5–15.5)
WBC: 9.6 10*3/uL (ref 4.0–10.5)

## 2013-11-25 LAB — BASIC METABOLIC PANEL
BUN: 15 mg/dL (ref 6–23)
CALCIUM: 9.4 mg/dL (ref 8.4–10.5)
CO2: 26 meq/L (ref 19–32)
CREATININE: 0.91 mg/dL (ref 0.50–1.35)
Chloride: 99 mEq/L (ref 96–112)
GFR calc Af Amer: >90 mL/min (ref >90)
GLUCOSE: 207 mg/dL — AB (ref 70–99)
Potassium: 4.1 mEq/L (ref 3.7–5.3)
Sodium: 140 mEq/L (ref 137–147)

## 2013-11-25 LAB — TROPONIN I: Troponin I: <0.3 ng/mL (ref <0.30)

## 2013-11-25 MED ORDER — PANTOPRAZOLE SODIUM 20 MG PO TBEC: 20.0000 mg | DELAYED_RELEASE_TABLET | Freq: Two times a day (BID) | ORAL | Status: DC

## 2013-11-25 NOTE — ED Notes (Signed)
Pt reports dull centralized chest pain this morning. Pt states episode lasted approximately one hour. Pt had similar episode on Saturday. Pt denies SOB, dizziness, diaphoresis but does report back pain.

## 2013-11-25 NOTE — Telephone Encounter (Signed)
Patient walked into office complaining of active chest pain.Marland KitchenMarland KitchenMarland Kitchen

## 2013-11-25 NOTE — ED Notes (Signed)
Chest pain on Saturday and again today, NO pain at present, Hx of MI, alert, skin warm and dry

## 2013-11-25 NOTE — Telephone Encounter (Signed)
Patient walked into office with c/o active mid sternal chest pain rate 6-7 on a scale of 1-10, (10 being the greatest), that started on Saturday of last week. Patient said the chest pain lasted for about an hour. He said that he took 1 nitroglycerin that gave no relief. Patient said the pain was also in between his shoulder blades and his left arm felt very warm. Patient informed nurse that he had eaten steak fajitas that night. Patient also said that he ate fajitas last night and today he woke up with the same chest pain that too lasted an hour. Patient said he did not take any nitroglycerin. Patient denied sob, or dizziness. Medications reviewed with patient.  Nurse advised patient that he needed to go to he ED for an evaluation, and take his nitroglycerin as directed. Patient also requested an appointment with his cardiologist. Appointment given to patient for 01/07/14 @3 :00 pm at the [place] office. Patient verbalized understanding of plan.

## 2013-11-25 NOTE — Discharge Instructions (Signed)
Increase your Protinix to twice per day. (AM and PM).  Chest Pain (Nonspecific) It is often hard to give a specific diagnosis for the cause of chest pain. There is always a chance that your pain could be related to something serious, such as a heart attack or a blood clot in the lungs. You need to follow up with your caregiver for further evaluation. CAUSES   Heartburn.  Pneumonia or bronchitis.  Anxiety or stress.  Inflammation around your heart (pericarditis) or lung (pleuritis or pleurisy).  A blood clot in the lung.  A collapsed lung (pneumothorax). It can develop suddenly on its own (spontaneous pneumothorax) or from injury (trauma) to the chest.  Shingles infection (herpes zoster virus). The chest wall is composed of bones, muscles, and cartilage. Any of these can be the source of the pain.  The bones can be bruised by injury.  The muscles or cartilage can be strained by coughing or overwork.  The cartilage can be affected by inflammation and become sore (costochondritis). DIAGNOSIS  Lab tests or other studies, such as X-rays, electrocardiography, stress testing, or cardiac imaging, may be needed to find the cause of your pain.  TREATMENT   Treatment depends on what may be causing your chest pain. Treatment may include:  Acid blockers for heartburn.  Anti-inflammatory medicine.  Pain medicine for inflammatory conditions.  Antibiotics if an infection is present.  You may be advised to change lifestyle habits. This includes stopping smoking and avoiding alcohol, caffeine, and chocolate.  You may be advised to keep your head raised (elevated) when sleeping. This reduces the chance of acid going backward from your stomach into your esophagus.  Most of the time, nonspecific chest pain will improve within 2 to 3 days with rest and mild pain medicine. HOME CARE INSTRUCTIONS   If antibiotics were prescribed, take your antibiotics as directed. Finish them even if you start  to feel better.  For the next few days, avoid physical activities that bring on chest pain. Continue physical activities as directed.  Do not smoke.  Avoid drinking alcohol.  Only take over-the-counter or prescription medicine for pain, discomfort, or fever as directed by your caregiver.  Follow your caregiver's suggestions for further testing if your chest pain does not go away.  Keep any follow-up appointments you made. If you do not go to an appointment, you could develop lasting (chronic) problems with pain. If there is any problem keeping an appointment, you must call to reschedule. SEEK MEDICAL CARE IF:   You think you are having problems from the medicine you are taking. Read your medicine instructions carefully.  Your chest pain does not go away, even after treatment.  You develop a rash with blisters on your chest. SEEK IMMEDIATE MEDICAL CARE IF:   You have increased chest pain or pain that spreads to your arm, neck, jaw, back, or abdomen.  You develop shortness of breath, an increasing cough, or you are coughing up blood.  You have severe back or abdominal pain, feel nauseous, or vomit.  You develop severe weakness, fainting, or chills.  You have a fever. THIS IS AN EMERGENCY. Do not wait to see if the pain will go away. Get medical help at once. Call your local emergency services (911 in [place] Do not drive yourself to the hospital. MAKE SURE YOU:   Understand these instructions.  Will watch your condition.  Will get help right away if you are not doing well or get worse. Document Released: 03/20/2005  Document Revised: 09/02/2011 Document Reviewed: 01/14/2008 [facility] Patient Information 2014 [place].  Diet for Gastroesophageal Reflux Disease, Adult Reflux (acid reflux) is when acid from your stomach flows up into the esophagus. When acid comes in contact with the esophagus, the acid causes irritation and soreness (inflammation) in the esophagus. When  reflux happens often or so severely that it causes damage to the esophagus, it is called gastroesophageal reflux disease (GERD). Nutrition therapy can help ease the discomfort of GERD. FOODS OR DRINKS TO AVOID OR LIMIT  Smoking or chewing tobacco. Nicotine is one of the most potent stimulants to acid production in the gastrointestinal tract.  Caffeinated and decaffeinated coffee and black tea.  Regular or low-calorie carbonated beverages or energy drinks (caffeine-free carbonated beverages are allowed).   Strong spices, such as black pepper, white pepper, red pepper, cayenne, curry powder, and chili powder.  Peppermint or spearmint.  Chocolate.  High-fat foods, including meats and fried foods. Extra added fats including oils, butter, salad dressings, and nuts. Limit these to less than 8 tsp per day.  Fruits and vegetables if they are not tolerated, such as citrus fruits or tomatoes.  Alcohol.  Any food that seems to aggravate your condition. If you have questions regarding your diet, call your caregiver or a registered dietitian. OTHER THINGS THAT MAY HELP GERD INCLUDE:   Eating your meals slowly, in a relaxed setting.  Eating 5 to 6 small meals per day instead of 3 large meals.  Eliminating food for a period of time if it causes distress.  Not lying down until 3 hours after eating a meal.  Keeping the head of your bed raised 6 to 9 inches (15 to 23 cm) by using a foam wedge or blocks under the legs of the bed. Lying flat may make symptoms worse.  Being physically active. Weight loss may be helpful in reducing reflux in overweight or obese adults.  Wear loose fitting clothing EXAMPLE MEAL PLAN This meal plan is approximately 2,000 calories based on [email or web address] meal planning guidelines. Breakfast   cup cooked oatmeal.  1 cup strawberries.  1 cup low-fat milk.  1 oz almonds. Snack  1 cup cucumber slices.  6 oz yogurt (made from low-fat or fat-free  milk). Lunch  2 slice whole-wheat bread.  2 oz sliced [place].  2 tsp mayonnaise.  1 cup blueberries.  1 cup snap peas. Snack  6 whole-wheat crackers.  1 oz string cheese. Dinner   cup brown rice.  1 cup mixed veggies.  1 tsp olive oil.  3 oz grilled fish. Document Released: 06/10/2005 Document Revised: 09/02/2011 Document Reviewed: 04/26/2011 [facility] Patient Information 2014 [place], [place].  Gastroesophageal Reflux Disease, Adult Gastroesophageal reflux disease (GERD) happens when acid from your stomach flows up into the esophagus. When acid comes in contact with the esophagus, the acid causes soreness (inflammation) in the esophagus. Over time, GERD may create small holes (ulcers) in the lining of the esophagus. CAUSES   Increased body weight. This puts pressure on the stomach, making acid rise from the stomach into the esophagus.  Smoking. This increases acid production in the stomach.  Drinking alcohol. This causes decreased pressure in the lower esophageal sphincter (valve or ring of muscle between the esophagus and stomach), allowing acid from the stomach into the esophagus.  Late evening meals and a full stomach. This increases pressure and acid production in the stomach.  A malformed lower esophageal sphincter. Sometimes, no cause is found. SYMPTOMS   Burning pain in the  lower part of the mid-chest behind the breastbone and in the mid-stomach area. This may occur twice a week or more often.  Trouble swallowing.  Sore throat.  Dry cough.  Asthma-like symptoms including chest tightness, shortness of breath, or wheezing. DIAGNOSIS  Your caregiver may be able to diagnose GERD based on your symptoms. In some cases, X-rays and other tests may be done to check for complications or to check the condition of your stomach and esophagus. TREATMENT  Your caregiver may recommend over-the-counter or prescription medicines to help decrease acid production. Ask  your caregiver before starting or adding any new medicines.  HOME CARE INSTRUCTIONS   Change the factors that you can control. Ask your caregiver for guidance concerning weight loss, quitting smoking, and alcohol consumption.  Avoid foods and drinks that make your symptoms worse, such as:  Caffeine or alcoholic drinks.  Chocolate.  Peppermint or mint flavorings.  Garlic and onions.  Spicy foods.  Citrus fruits, such as oranges, lemons, or limes.  Tomato-based foods such as sauce, chili, salsa, and pizza.  Fried and fatty foods.  Avoid lying down for the 3 hours prior to your bedtime or prior to taking a nap.  Eat small, frequent meals instead of large meals.  Wear loose-fitting clothing. Do not wear anything tight around your waist that causes pressure on your stomach.  Raise the head of your bed 6 to 8 inches with wood blocks to help you sleep. Extra pillows will not help.  Only take over-the-counter or prescription medicines for pain, discomfort, or fever as directed by your caregiver.  Do not take aspirin, ibuprofen, or other nonsteroidal anti-inflammatory drugs (NSAIDs). SEEK IMMEDIATE MEDICAL CARE IF:   You have pain in your arms, neck, jaw, teeth, or back.  Your pain increases or changes in intensity or duration.  You develop nausea, vomiting, or sweating (diaphoresis).  You develop shortness of breath, or you faint.  Your vomit is green, yellow, black, or looks like coffee grounds or blood.  Your stool is red, bloody, or black. These symptoms could be signs of other problems, such as heart disease, gastric bleeding, or esophageal bleeding. MAKE SURE YOU:   Understand these instructions.  Will watch your condition.  Will get help right away if you are not doing well or get worse. Document Released: 03/20/2005 Document Revised: 09/02/2011 Document Reviewed: 12/28/2010 [facility] Patient Information 2014 [place], [place].

## 2013-11-25 NOTE — ED Provider Notes (Signed)
CSN: [id number]     Arrival date & time 11/25/13  1238 History   First MD Initiated Contact with Patient 11/25/13 1310     Chief Complaint  Patient presents with  . Chest Pain      HPI  Vision presents after an episode of chest pain starting at 9 this morning until 10 AM. Has a history of heart catheterization showed minimal cardiac disease in 2013. Headache EKG changes at the time thought to be due to esophageal spasm. Has risks and continues on appropriate medications and is compliant. He is on Norvasc, and Imdur. He does not exercise. Takes Zocor for cholesterol, and Glynase for his blood sugar. 2 nights ago he had fajitas and had an hour of discomfort afterwards it felt like his chest was burning. He drives at night. Last night his girlfriend cut him another [place] meal.  9 AM this morning at the end of his route he felt burning and pressure in his lower chest from 9 until 10. It resolved. He presents here pain-free after 3 hours.  Past Medical History  Diagnosis Date  . Diabetes mellitus   . Sleep apnea   . Coronary artery spasm 2013  . CAD (coronary artery disease), minimal, non obstructive 2013 01/01/2013  . Hypertension    Past Surgical History  Procedure Laterality Date  . No past surgeries    . Cardiac catheterization  2013    non obstructive CAD  ~20%   Family History  Problem Relation Age of Onset  . Diabetes type II Sister   . Heart attack Cousin    History  Substance Use Topics  . Smoking status: Former Smoker    Quit date: 08/18/1995  . Smokeless tobacco: Not on file  . Alcohol Use: No    Review of Systems  Constitutional: Negative for fever, chills, diaphoresis, appetite change and fatigue.  HENT: Negative for mouth sores, sore throat and trouble swallowing.   Eyes: Negative for visual disturbance.  Respiratory: Negative for cough, chest tightness, shortness of breath and wheezing.   Cardiovascular: Positive for chest pain.  Gastrointestinal: Negative for  nausea, vomiting, abdominal pain, diarrhea and abdominal distention.  Endocrine: Negative for polydipsia, polyphagia and polyuria.  Genitourinary: Negative for dysuria, frequency and hematuria.  Musculoskeletal: Negative for gait problem.  Skin: Negative for color change, pallor and rash.  Neurological: Negative for dizziness, syncope, light-headedness and headaches.  Hematological: Does not bruise/bleed easily.  Psychiatric/Behavioral: Negative for behavioral problems and confusion.      Allergies  Codeine and Sulfa antibiotics  Home Medications   Prior to Admission medications   Medication Sig Start Date End Date Taking? Authorizing Provider  amLODipine (NORVASC) 10 MG tablet Take 1 tablet (10 mg total) by mouth daily. 08/23/13  Yes Herminio Commons, MD  aspirin 81 MG chewable tablet Chew 1 tablet (81 mg total) by mouth daily. 01/01/13  Yes Varney Biles, MD  glyBURIDE micronized (GLYNASE) 6 MG tablet Take 6 mg by mouth daily.   Yes Historical Provider, MD  ibuprofen (ADVIL) 200 MG tablet Take 200 mg by mouth every 6 (six) hours as needed. For pain/headaches.   Yes Historical Provider, MD  isosorbide mononitrate (IMDUR) 30 MG 24 hr tablet Take 1 tablet (30 mg total) by mouth daily. 05/28/13  Yes Herminio Commons, MD  Magnesium Gluconate 27.5 MG TABS Take 1 tablet by mouth as needed. cramps   Yes Historical Provider, MD  metFORMIN (GLUCOPHAGE) 500 MG tablet Take 500 mg by mouth daily  with breakfast.    Yes Historical Provider, MD  pantoprazole (PROTONIX) 40 MG tablet Take 40 mg by mouth daily.   Yes Historical Provider, MD  simvastatin (ZOCOR) 40 MG tablet Take 1 tablet (40 mg total) by mouth every evening. 05/28/13  Yes Herminio Commons, MD  diphenhydrAMINE (BENADRYL) 50 MG capsule Take 50 mg by mouth at bedtime as needed for itching.    Historical Provider, MD   BP 128/82  Pulse 61  Temp(Src) 98.5 F (36.9 C) (Oral)  Resp 12  Ht 6\' 4"  (1.93 m)  Wt 320 lb (145.151 kg)  BMI  38.97 kg/m2  SpO2 100% Physical Exam  Constitutional: He is oriented to person, place, and time. He appears well-developed and well-nourished. No distress.  HENT:  Head: Normocephalic.  Eyes: Conjunctivae are normal. Pupils are equal, round, and reactive to light. No scleral icterus.  Neck: Normal range of motion. Neck supple. No thyromegaly present.  Cardiovascular: Normal rate and regular rhythm.  Exam reveals no gallop and no friction rub.   No murmur heard. Pulmonary/Chest: Effort normal and breath sounds normal. No respiratory distress. He has no wheezes. He has no rales.  Abdominal: Soft. Bowel sounds are normal. He exhibits no distension. There is no tenderness. There is no rebound.  Musculoskeletal: Normal range of motion.  Neurological: He is alert and oriented to person, place, and time.  Skin: Skin is warm and dry. No rash noted.  Psychiatric: He has a normal mood and affect. His behavior is normal.    ED Course  Procedures (including critical care time) Labs Review Labs Reviewed  BASIC METABOLIC PANEL - Abnormal; Notable for the following:    Glucose, Bld 207 (*)    All other components within normal limits  CBC WITH DIFFERENTIAL  TROPONIN I    Imaging Review Dg Chest Portable 1 View  11/25/2013   CLINICAL DATA:  Chest pain.  EXAM: PORTABLE CHEST - 1 VIEW  COMPARISON:  01/01/2013.  FINDINGS: Cardiopericardial silhouette within normal limits. Mediastinal contours normal. Trachea midline. No airspace disease or effusion. Monitoring leads project over the chest.  IMPRESSION: No active disease.   Electronically Signed   By: Dereck Ligas M.D.   On: 11/25/2013 13:08     EKG Interpretation   Date/Time:  Thursday November 25 2013 12:46:18 EDT Ventricular Rate:  68 PR Interval:  189 QRS Duration: 108 QT Interval:  403 QTC Calculation: 429 R Axis:   -7 Text Interpretation:  Sinus rhythm Probable anteroseptal infarct, old  Baseline wander in lead(s) V3 V6 Confirmed by  Jeneen Rinks  MD, Taylor ([id number]) on  11/25/2013 1:38:17 PM      MDM   Final diagnoses:  Chest pain  GERD (gastroesophageal reflux disease)    One hour of pain. Normal EKGs. Normal enzymes 4 hours after the episode. I do not think this is cardiac in nature. I do believe this was reflux. Asked him to double his Protonix. His given GI referral. Return if any recurrence of symptoms.    Tanna Furry, MD 11/25/13 [phone]

## 2014-01-04 ENCOUNTER — Other Ambulatory Visit: Payer: Self-pay | Admitting: Cardiovascular Disease

## 2014-01-05 ENCOUNTER — Ambulatory Visit (INDEPENDENT_AMBULATORY_CARE_PROVIDER_SITE_OTHER): Payer: PRIVATE HEALTH INSURANCE | Admitting: Cardiovascular Disease

## 2014-01-05 ENCOUNTER — Encounter: Payer: Self-pay | Admitting: Cardiovascular Disease

## 2014-01-05 VITALS — BP 144/92 | HR 81 | Ht 75.0 in | Wt 309.0 lb

## 2014-01-05 DIAGNOSIS — G4733 Obstructive sleep apnea (adult) (pediatric): Secondary | ICD-10-CM

## 2014-01-05 DIAGNOSIS — Z87898 Personal history of other specified conditions: Secondary | ICD-10-CM

## 2014-01-05 DIAGNOSIS — I10 Essential (primary) hypertension: Secondary | ICD-10-CM

## 2014-01-05 DIAGNOSIS — R0609 Other forms of dyspnea: Secondary | ICD-10-CM

## 2014-01-05 DIAGNOSIS — I2510 Atherosclerotic heart disease of native coronary artery without angina pectoris: Secondary | ICD-10-CM

## 2014-01-05 DIAGNOSIS — I2583 Coronary atherosclerosis due to lipid rich plaque: Secondary | ICD-10-CM

## 2014-01-05 DIAGNOSIS — R0989 Other specified symptoms and signs involving the circulatory and respiratory systems: Secondary | ICD-10-CM

## 2014-01-05 DIAGNOSIS — Z9289 Personal history of other medical treatment: Secondary | ICD-10-CM

## 2014-01-05 DIAGNOSIS — K219 Gastro-esophageal reflux disease without esophagitis: Secondary | ICD-10-CM

## 2014-01-05 DIAGNOSIS — R079 Chest pain, unspecified: Secondary | ICD-10-CM

## 2014-01-05 NOTE — Progress Notes (Signed)
Patient ID: FRISCO CORDTS, male   DOB: Jul 23, 1965, 48 y.o.   MRN: [id number]      SUBJECTIVE: The patient is here to followup for chest pain. In summary, he is a 48 year old male with a past medical history of nonobstructive coronary artery disease and hypertension. He underwent cardiac catheterization for possible MI in February 2013 which revealed coronary vasospasm. He has a known history of gastroesophageal reflux disease and is currently taking Protonix. He's also noted to have esophageal spasm. He also has diabetes, with a prior HbA1C of 6.8%.   He has been struggling with chest pain which gets worse when he lies down and is relieved when he sits up. He recently ate a spicy chicken biscuit from [place] and shortly after that had significant retrosternal chest pain and belching as well as flatus. He's struggled with constipation as well and takes a laxative. He recently experienced retrosternal chest pain after eating steak fajitas. He feels flushed and has numbness of his left arm as well.   He presented to the ED with chest pain shortly after this episode on June 4. Troponins were normal as well as his ECG and chest x-ray. The ED physician thought it was due to gastroesophageal reflux disease.   He is being scheduled to undergo an EGD tomorrow.    Allergies  Allergen Reactions  . Codeine Nausea Only  . Sulfa Antibiotics Nausea Only    Current Outpatient Prescriptions  Medication Sig Dispense Refill  . amLODipine (NORVASC) 10 MG tablet Take 1 tablet (10 mg total) by mouth daily.  30 tablet  6  . aspirin 81 MG chewable tablet Chew 1 tablet (81 mg total) by mouth daily.  30 tablet  0  . diphenhydrAMINE (BENADRYL) 50 MG capsule Take 50 mg by mouth at bedtime as needed for itching.      . glyBURIDE micronized (GLYNASE) 6 MG tablet Take 6 mg by mouth daily.      . isosorbide mononitrate (IMDUR) 30 MG 24 hr tablet TAKE 1 TABLET BY MOUTH EVERY DAY  30 tablet  6  . Magnesium Gluconate  27.5 MG TABS Take 1 tablet by mouth as needed. cramps      . metFORMIN (GLUCOPHAGE) 500 MG tablet Take 500 mg by mouth daily with breakfast.       . pantoprazole (PROTONIX) 20 MG tablet Take 1 tablet (20 mg total) by mouth 2 (two) times daily.  60 tablet  1  . simvastatin (ZOCOR) 40 MG tablet TAKE 1 TABLET BY MOUTH EVERY EVENING  30 tablet  6   No current facility-administered medications for this visit.    Past Medical History  Diagnosis Date  . Diabetes mellitus   . Sleep apnea   . Coronary artery spasm 2013  . CAD (coronary artery disease), minimal, non obstructive 2013 01/01/2013  . Hypertension     Past Surgical History  Procedure Laterality Date  . No past surgeries    . Cardiac catheterization  2013    non obstructive CAD  ~20%    History   Social History  . Marital Status: Divorced    Spouse Name: N/A    Number of Children: N/A  . Years of Education: N/A   Occupational History  . Not on file.   Social History Main Topics  . Smoking status: Former Smoker    Quit date: 08/18/1995  . Smokeless tobacco: Not on file  . Alcohol Use: No  . Drug Use: No  . Sexual Activity:  Not on file   Other Topics Concern  . Not on file   Social History Narrative   Divorced, no children, works as a Production designer, theatre/television/film.     Filed Vitals:   01/05/14 1405  BP: 144/92  Pulse: 81  Height: 6' 3"  (1.905 m)  Weight: 309 lb (140.161 kg)    PHYSICAL EXAM General: NAD Neck: No JVD, no thyromegaly. Lungs: Clear to auscultation bilaterally with normal respiratory effort. CV: Nondisplaced PMI.  Regular rate and rhythm, normal S1/S2, no S3/S4, no murmur. No pretibial or periankle edema.  No carotid bruit.  Normal pedal pulses.  Abdomen: Soft, nontender, no hepatosplenomegaly, no distention.  Neurologic: Alert and oriented x 3.  Psych: Normal affect. Extremities: No clubbing or cyanosis.   ECG: reviewed and available in electronic records.      ASSESSMENT AND PLAN: 1.  Chest pain: This may very well be due to gastroesophageal reflux disease with esophagitis and possible esophageal spasm. He is scheduled to undergo an EGD tomorrow. For the time being, I will continue Imdur 30 mg daily. I offered to give him a prescription for sucralfate, but he would rather not take any more medications and hopes to eventually come off of the Imdur. 2. Nonobstructive CAD: At the present time, I will continue aspirin and Imdur 30 mg daily, along with simvastatin 40 mg daily as per ADA recommendations, in order to halt the progression of obstructive coronary artery disease. At the present time, I will not pursue nuclear stress testing. His symptoms sound more GI in etiology. 3. Sleep apnea: Now on CPAP. 4. HTN: I encouraged therapeutic lifestyle changes. I previously increased amlodipine to 10 mg daily. Will monitor for now. 5. Diabetes mellitus: Relatively well controlled with previous HbA1C of 6.8%.  6. GERD: Continue PPI and monitor diet. EGD tomorrow. Offered sucralfate, he declined.  Dispo: f/u 2-3 months.  Kate Sable, M.D., F.A.C.C.

## 2014-01-05 NOTE — Patient Instructions (Signed)
Continue all current medications.    Follow up in 2-3 months.

## 2014-01-06 ENCOUNTER — Encounter: Payer: Self-pay | Admitting: Gastroenterology

## 2014-01-06 ENCOUNTER — Encounter (INDEPENDENT_AMBULATORY_CARE_PROVIDER_SITE_OTHER): Payer: Self-pay

## 2014-01-06 ENCOUNTER — Ambulatory Visit (INDEPENDENT_AMBULATORY_CARE_PROVIDER_SITE_OTHER): Payer: PRIVATE HEALTH INSURANCE | Admitting: Gastroenterology

## 2014-01-06 VITALS — BP 141/90 | HR 75 | Temp 98.6°F | Ht 75.0 in | Wt 310.2 lb

## 2014-01-06 DIAGNOSIS — R1310 Dysphagia, unspecified: Secondary | ICD-10-CM

## 2014-01-06 DIAGNOSIS — K219 Gastro-esophageal reflux disease without esophagitis: Secondary | ICD-10-CM

## 2014-01-06 MED ORDER — SUCRALFATE 1 GM/10ML PO SUSP: 1.0000 g | Freq: Four times a day (QID) | ORAL | Status: DC

## 2014-01-06 NOTE — Patient Instructions (Signed)
Start taking the Dexilant samples once each morning. We have provided samples. This is in place of Protonix.  I sent Carafate suspension to your pharmacy to take three times a day to help coat your stomach.  You have been scheduled for an upper endoscopy with possible dilation with Dr. Gala Romney. Further recommendations to follow.

## 2014-01-06 NOTE — Progress Notes (Signed)
Primary Care Physician:  Glenda Chroman., MD Primary Gastroenterologist:  Dr. Gala Romney   Chief Complaint  Patient presents with  . Chest Pain    Needs EGD    HPI:   Jerome Shepherd presents today with chronic but worsening GERD symptoms. Symptoms present for at least a year. Has had severe chest discomfort, retrosternal pain, thought having a heart attack. Went to ED three separate times. Thought he was going to die. Seen by cardiology July 15. Felt to be GI etiology. At times feels so bloated in upper abdomen. Will even have tingling down left arm with episodes. Burping occasionally. Has acid coming up. Stays nauseated. One episode of vomiting. Pinkish looking liquid. Hasn't felt well in a long time. No lack of appetite. Sometimes drinking tea feels like it gets hung in cervical area. Ate a chicken biscuit last week and an hour later kicked in. Epigastric burning. Used to take ibuprofen in the past but quit. No melena. No hematochezia. Protonix BID without much improvement. Takes Zantac prn as well. Sometimes takes 3-4 a day. Reports history of ulcers as a teenager. If lays down symptoms are worse. Drinks carbonated sodas.   Past Medical History  Diagnosis Date  . Diabetes mellitus   . Sleep apnea   . Coronary artery spasm 2013  . CAD (coronary artery disease), minimal, non obstructive 2013 01/01/2013  . Hypertension     Past Surgical History  Procedure Laterality Date  . No past surgeries    . Cardiac catheterization  2013    non obstructive CAD  ~20%    Current Outpatient Prescriptions  Medication Sig Dispense Refill  . amLODipine (NORVASC) 10 MG tablet Take 1 tablet (10 mg total) by mouth daily.  30 tablet  6  . aspirin 81 MG chewable tablet Chew 1 tablet (81 mg total) by mouth daily.  30 tablet  0  . diphenhydrAMINE (BENADRYL) 50 MG capsule Take 50 mg by mouth at bedtime as needed for itching.      . glyBURIDE micronized (GLYNASE) 6 MG tablet Take 6 mg by mouth daily with  breakfast.       . isosorbide mononitrate (IMDUR) 30 MG 24 hr tablet TAKE 1 TABLET BY MOUTH EVERY DAY  30 tablet  6  . Magnesium Gluconate 27.5 MG TABS Take 1 tablet by mouth as needed. cramps      . metFORMIN (GLUCOPHAGE) 500 MG tablet Take 500 mg by mouth daily with breakfast.       . pantoprazole (PROTONIX) 20 MG tablet Take 1 tablet (20 mg total) by mouth 2 (two) times daily.  60 tablet  1  . simvastatin (ZOCOR) 40 MG tablet TAKE 1 TABLET BY MOUTH EVERY EVENING  30 tablet  6   No current facility-administered medications for this visit.    Allergies as of 01/06/2014 - Review Complete 01/06/2014  Allergen Reaction Noted  . Codeine Nausea Only 08/18/2011  . Sulfa antibiotics Nausea Only 08/18/2011    Family History  Problem Relation Age of Onset  . Diabetes type II Sister   . Heart attack Cousin   . Colon cancer Neg Hx   . Stomach cancer Neg Hx     History   Social History  . Marital Status: Divorced    Spouse Name: N/A    Number of Children: N/A  . Years of Education: N/A   Occupational History  . truck driver    Social History Main Topics  . Smoking status:  Former Smoker    Quit date: 08/18/1995  . Smokeless tobacco: Not on file     Comment: Quit smoking x 20 years  . Alcohol Use: No  . Drug Use: No  . Sexual Activity: Not on file   Other Topics Concern  . Not on file   Social History Narrative   Divorced, no children, works as a Production designer, theatre/television/film.    Review of Systems: As mentioned in HPI.   Physical Exam: BP 141/90  Pulse 75  Temp(Src) 98.6 F (37 C) (Oral)  Ht 6\' 3"  (1.905 m)  Wt 310 lb 3.2 oz (140.706 kg)  BMI 38.77 kg/m2 General:   Alert and oriented. Pleasant and cooperative. Well-nourished and well-developed.  Head:  Normocephalic and atraumatic. Eyes:  Without icterus, sclera clear and conjunctiva pink.  Ears:  Normal auditory acuity. Nose:  No deformity, discharge,  or lesions. Mouth:  No deformity or lesions, oral mucosa pink.    Neck:  Supple, without mass or thyromegaly. Lungs:  Clear to auscultation bilaterally. No wheezes, rales, or rhonchi. No distress.  Heart:  S1, S2 present without murmurs appreciated.  Abdomen:  +BS, soft, obese with large AP diameter, non-tender and non-distended. Difficult to appreciate HSM due to large body habitus. Rectal:  Deferred  Msk:  Symmetrical without gross deformities. Normal posture. Pulses:  Normal pulses noted. Extremities:  Without clubbing or edema. Neurologic:  Alert and  oriented x4;  grossly normal neurologically. Skin:  Intact without significant lesions or rashes. Psych:  Alert and cooperative. Normal mood and affect.  Lab Results  Component Value Date   WBC 9.6 11/25/2013   HGB 16.4 11/25/2013   HCT 46.0 11/25/2013   MCV 87.6 11/25/2013   PLT 217 11/25/2013

## 2014-01-07 ENCOUNTER — Ambulatory Visit: Payer: No Typology Code available for payment source | Admitting: Cardiovascular Disease

## 2014-01-07 ENCOUNTER — Other Ambulatory Visit: Payer: Self-pay | Admitting: Internal Medicine

## 2014-01-07 DIAGNOSIS — K2100 Gastro-esophageal reflux disease with esophagitis, without bleeding: Secondary | ICD-10-CM

## 2014-01-09 NOTE — Assessment & Plan Note (Addendum)
48 year old male with worsening GERD, significant retrosternal discomfort, epigastric discomfort, and nausea without improvement despite PPI BID. Likely multifactorial secondary to dietary and behavior, as well as recent prior NSAID usage. Discussed avoidance of carbonated drinks, fatty foods, and aggressive weight loss. Gallbladder remains in situ but less likely biliary etiology. Intermittent dysphagia also noted likely secondary to esophagitis, possible web, ring, or stricture. Needs further evaluation with EGD and dilation as appropriate in near future with Dr. Gala Romney. Risks and benefits discussed in detail with stated understanding. Trial of Dexilant, with samples provided. Short course of Carafate provided.

## 2014-01-09 NOTE — Assessment & Plan Note (Signed)
Dilation as appropriate.  

## 2014-01-10 ENCOUNTER — Encounter (HOSPITAL_COMMUNITY)
Admission: RE | Disposition: A | Discharge status: Home / Self Care | Payer: Self-pay | Source: Ambulatory Visit | Attending: Internal Medicine

## 2014-01-10 ENCOUNTER — Other Ambulatory Visit: Payer: Self-pay | Admitting: Internal Medicine

## 2014-01-10 ENCOUNTER — Encounter (HOSPITAL_COMMUNITY): Payer: Self-pay | Admitting: *Deleted

## 2014-01-10 ENCOUNTER — Ambulatory Visit (HOSPITAL_COMMUNITY)
Admission: RE | Admit: 2014-01-10 | Discharge: 2014-01-10 | Disposition: A | Discharge status: Home / Self Care | Payer: No Typology Code available for payment source | Source: Ambulatory Visit | Attending: Internal Medicine | Admitting: Internal Medicine

## 2014-01-10 DIAGNOSIS — R1314 Dysphagia, pharyngoesophageal phase: Secondary | ICD-10-CM

## 2014-01-10 DIAGNOSIS — R143 Flatulence: Secondary | ICD-10-CM

## 2014-01-10 DIAGNOSIS — R1011 Right upper quadrant pain: Secondary | ICD-10-CM

## 2014-01-10 DIAGNOSIS — E119 Type 2 diabetes mellitus without complications: Secondary | ICD-10-CM | POA: Insufficient documentation

## 2014-01-10 DIAGNOSIS — R1310 Dysphagia, unspecified: Secondary | ICD-10-CM | POA: Insufficient documentation

## 2014-01-10 DIAGNOSIS — Z79899 Other long term (current) drug therapy: Secondary | ICD-10-CM | POA: Insufficient documentation

## 2014-01-10 DIAGNOSIS — K2940 Chronic atrophic gastritis without bleeding: Secondary | ICD-10-CM | POA: Insufficient documentation

## 2014-01-10 DIAGNOSIS — R141 Gas pain: Secondary | ICD-10-CM | POA: Insufficient documentation

## 2014-01-10 DIAGNOSIS — K219 Gastro-esophageal reflux disease without esophagitis: Secondary | ICD-10-CM | POA: Insufficient documentation

## 2014-01-10 DIAGNOSIS — K2100 Gastro-esophageal reflux disease with esophagitis, without bleeding: Secondary | ICD-10-CM

## 2014-01-10 DIAGNOSIS — Z87891 Personal history of nicotine dependence: Secondary | ICD-10-CM | POA: Insufficient documentation

## 2014-01-10 DIAGNOSIS — R142 Eructation: Secondary | ICD-10-CM

## 2014-01-10 DIAGNOSIS — I10 Essential (primary) hypertension: Secondary | ICD-10-CM | POA: Insufficient documentation

## 2014-01-10 DIAGNOSIS — K253 Acute gastric ulcer without hemorrhage or perforation: Secondary | ICD-10-CM

## 2014-01-10 DIAGNOSIS — R079 Chest pain, unspecified: Secondary | ICD-10-CM | POA: Insufficient documentation

## 2014-01-10 HISTORY — PX: MALONEY DILATION: SHX5535

## 2014-01-10 HISTORY — PX: ESOPHAGOGASTRODUODENOSCOPY: SHX5428

## 2014-01-10 HISTORY — PX: SAVORY DILATION: SHX5439

## 2014-01-10 HISTORY — DX: Constipation, unspecified: K59.00

## 2014-01-10 LAB — GLUCOSE, CAPILLARY: GLUCOSE-CAPILLARY: 172 mg/dL — AB (ref 70–99)

## 2014-01-10 SURGERY — EGD (ESOPHAGOGASTRODUODENOSCOPY)
Anesthesia: Moderate Sedation

## 2014-01-10 MED ORDER — MIDAZOLAM HCL 5 MG/5ML IJ SOLN
INTRAMUSCULAR | Status: DC | PRN
  Administered 2014-01-10 (×2): 1 mg via INTRAVENOUS
  Administered 2014-01-10 (×2): 2 mg via INTRAVENOUS

## 2014-01-10 MED ORDER — MIDAZOLAM HCL 5 MG/5ML IJ SOLN
INTRAMUSCULAR | Status: AC
  Filled 2014-01-10: qty 10

## 2014-01-10 MED ORDER — SODIUM CHLORIDE 0.9 % IV SOLN
INTRAVENOUS | Status: DC
Start: 2014-01-10 — End: 2014-01-10
  Administered 2014-01-10: 14:00:00 via INTRAVENOUS

## 2014-01-10 MED ORDER — MEPERIDINE HCL 100 MG/ML IJ SOLN
INTRAMUSCULAR | Status: AC
  Filled 2014-01-10: qty 2

## 2014-01-10 MED ORDER — STERILE WATER FOR IRRIGATION IR SOLN
Status: DC | PRN
  Administered 2014-01-10: 15:00:00

## 2014-01-10 MED ORDER — ONDANSETRON HCL 4 MG/2ML IJ SOLN
INTRAMUSCULAR | Status: DC | PRN
  Administered 2014-01-10: 4 mg via INTRAVENOUS

## 2014-01-10 MED ORDER — LIDOCAINE VISCOUS 2 % MT SOLN
OROMUCOSAL | Status: AC
  Filled 2014-01-10: qty 15

## 2014-01-10 MED ORDER — LIDOCAINE VISCOUS 2 % MT SOLN
OROMUCOSAL | Status: DC | PRN
  Administered 2014-01-10: 4 mL via OROMUCOSAL

## 2014-01-10 MED ORDER — ONDANSETRON HCL 4 MG/2ML IJ SOLN
INTRAMUSCULAR | Status: AC
  Filled 2014-01-10: qty 2

## 2014-01-10 MED ORDER — MEPERIDINE HCL 100 MG/ML IJ SOLN
INTRAMUSCULAR | Status: DC | PRN
Start: 2014-01-10 — End: 2014-01-10
  Administered 2014-01-10: 25 mg via INTRAVENOUS
  Administered 2014-01-10: 50 mg via INTRAVENOUS
  Administered 2014-01-10: 25 mg via INTRAVENOUS

## 2014-01-10 NOTE — Interval H&P Note (Signed)
History and Physical Interval Note:  01/10/2014 2:51 PM  Jerome Shepherd  has presented today for surgery, with the diagnosis of GERD  The various methods of treatment have been discussed with the patient and family. After consideration of risks, benefits and other options for treatment, the patient has consented to  Procedure(s) with comments: ESOPHAGOGASTRODUODENOSCOPY (EGD) (N/A) - 2:45 SAVORY DILATION (N/A) MALONEY DILATION (N/A) as a surgical intervention .  The patient's history has been reviewed, patient examined, no change in status, stable for surgery.  I have reviewed the patient's chart and labs.  Questions were answered to the patient's satisfaction.     Robert Rourk  Symptoms no better with 3 days worth of Dexilant. Could not afford Carafate.  EGD with potential esophageal dilation per plan.  The risks, benefits, limitations, alternatives and imponderables have been reviewed with the patient. Potential for esophageal dilation, biopsy, etc. have also been reviewed.  Questions have been answered. All parties agreeable.

## 2014-01-10 NOTE — H&P (View-Only) (Signed)
Primary Care Physician:  Glenda Chroman., MD Primary Gastroenterologist:  Dr. Gala Romney   Chief Complaint  Patient presents with  . Chest Pain    Needs EGD    HPI:   Jerome Shepherd presents today with chronic but worsening GERD symptoms. Symptoms present for at least a year. Has had severe chest discomfort, retrosternal pain, thought having a heart attack. Went to ED three separate times. Thought he was going to die. Seen by cardiology July 15. Felt to be GI etiology. At times feels so bloated in upper abdomen. Will even have tingling down left arm with episodes. Burping occasionally. Has acid coming up. Stays nauseated. One episode of vomiting. Pinkish looking liquid. Hasn't felt well in a long time. No lack of appetite. Sometimes drinking tea feels like it gets hung in cervical area. Ate a chicken biscuit last week and an hour later kicked in. Epigastric burning. Used to take ibuprofen in the past but quit. No melena. No hematochezia. Protonix BID without much improvement. Takes Zantac prn as well. Sometimes takes 3-4 a day. Reports history of ulcers as a teenager. If lays down symptoms are worse. Drinks carbonated sodas.   Past Medical History  Diagnosis Date  . Diabetes mellitus   . Sleep apnea   . Coronary artery spasm 2013  . CAD (coronary artery disease), minimal, non obstructive 2013 01/01/2013  . Hypertension     Past Surgical History  Procedure Laterality Date  . No past surgeries    . Cardiac catheterization  2013    non obstructive CAD  ~20%    Current Outpatient Prescriptions  Medication Sig Dispense Refill  . amLODipine (NORVASC) 10 MG tablet Take 1 tablet (10 mg total) by mouth daily.  30 tablet  6  . aspirin 81 MG chewable tablet Chew 1 tablet (81 mg total) by mouth daily.  30 tablet  0  . diphenhydrAMINE (BENADRYL) 50 MG capsule Take 50 mg by mouth at bedtime as needed for itching.      . glyBURIDE micronized (GLYNASE) 6 MG tablet Take 6 mg by mouth daily with  breakfast.       . isosorbide mononitrate (IMDUR) 30 MG 24 hr tablet TAKE 1 TABLET BY MOUTH EVERY DAY  30 tablet  6  . Magnesium Gluconate 27.5 MG TABS Take 1 tablet by mouth as needed. cramps      . metFORMIN (GLUCOPHAGE) 500 MG tablet Take 500 mg by mouth daily with breakfast.       . pantoprazole (PROTONIX) 20 MG tablet Take 1 tablet (20 mg total) by mouth 2 (two) times daily.  60 tablet  1  . simvastatin (ZOCOR) 40 MG tablet TAKE 1 TABLET BY MOUTH EVERY EVENING  30 tablet  6   No current facility-administered medications for this visit.    Allergies as of 01/06/2014 - Review Complete 01/06/2014  Allergen Reaction Noted  . Codeine Nausea Only 08/18/2011  . Sulfa antibiotics Nausea Only 08/18/2011    Family History  Problem Relation Age of Onset  . Diabetes type II Sister   . Heart attack Cousin   . Colon cancer Neg Hx   . Stomach cancer Neg Hx     History   Social History  . Marital Status: Divorced    Spouse Name: N/A    Number of Children: N/A  . Years of Education: N/A   Occupational History  . truck driver    Social History Main Topics  . Smoking status:  Former Smoker    Quit date: 08/18/1995  . Smokeless tobacco: Not on file     Comment: Quit smoking x 20 years  . Alcohol Use: No  . Drug Use: No  . Sexual Activity: Not on file   Other Topics Concern  . Not on file   Social History Narrative   Divorced, no children, works as a Production designer, theatre/television/film.    Review of Systems: As mentioned in HPI.   Physical Exam: BP 141/90  Pulse 75  Temp(Src) 98.6 F (37 C) (Oral)  Ht 6\' 3"  (1.905 m)  Wt 310 lb 3.2 oz (140.706 kg)  BMI 38.77 kg/m2 General:   Alert and oriented. Pleasant and cooperative. Well-nourished and well-developed.  Head:  Normocephalic and atraumatic. Eyes:  Without icterus, sclera clear and conjunctiva pink.  Ears:  Normal auditory acuity. Nose:  No deformity, discharge,  or lesions. Mouth:  No deformity or lesions, oral mucosa pink.    Neck:  Supple, without mass or thyromegaly. Lungs:  Clear to auscultation bilaterally. No wheezes, rales, or rhonchi. No distress.  Heart:  S1, S2 present without murmurs appreciated.  Abdomen:  +BS, soft, obese with large AP diameter, non-tender and non-distended. Difficult to appreciate HSM due to large body habitus. Rectal:  Deferred  Msk:  Symmetrical without gross deformities. Normal posture. Pulses:  Normal pulses noted. Extremities:  Without clubbing or edema. Neurologic:  Alert and  oriented x4;  grossly normal neurologically. Skin:  Intact without significant lesions or rashes. Psych:  Alert and cooperative. Normal mood and affect.  Lab Results  Component Value Date   WBC 9.6 11/25/2013   HGB 16.4 11/25/2013   HCT 46.0 11/25/2013   MCV 87.6 11/25/2013   PLT 217 11/25/2013

## 2014-01-10 NOTE — Discharge Instructions (Addendum)
EGD Discharge instructions Please read the instructions outlined below and refer to this sheet in the next few weeks. These discharge instructions provide you with general information on caring for yourself after you leave the hospital. Your doctor may also give you specific instructions. While your treatment has been planned according to the most current medical practices available, unavoidable complications occasionally occur. If you have any problems or questions after discharge, please call your doctor. ACTIVITY  You may resume your regular activity but move at a slower pace for the next 24 hours.   Take frequent rest periods for the next 24 hours.   Walking will help expel (get rid of) the air and reduce the bloated feeling in your abdomen.   No driving for 24 hours (because of the anesthesia (medicine) used during the test).   You may shower.   Do not sign any important legal documents or operate any machinery for 24 hours (because of the anesthesia used during the test).  NUTRITION  Drink plenty of fluids.   You may resume your normal diet.   Begin with a light meal and progress to your normal diet.   Avoid alcoholic beverages for 24 hours or as instructed by your caregiver.  MEDICATIONS  You may resume your normal medications unless your caregiver tells you otherwise.  WHAT YOU CAN EXPECT TODAY  You may experience abdominal discomfort such as a feeling of fullness or gas pains.  FOLLOW-UP  Your doctor will discuss the results of your test with you.  SEEK IMMEDIATE MEDICAL ATTENTION IF ANY OF THE FOLLOWING OCCUR:  Excessive nausea (feeling sick to your stomach) and/or vomiting.   Severe abdominal pain and distention (swelling).   Trouble swallowing.   Temperature over 101 F (37.8 C).   Rectal bleeding or vomiting of blood.   Continue Dexilant 60 mg daily  Schedule gallbladder ultrasound to evaluate upper abdominal/chest pain further  Further  recommendations to follow pending review of pathology report  No driving or operating machinery for 24 hours (none until 3:30 PM on July 21)

## 2014-01-10 NOTE — Op Note (Signed)
[facility] [address] [place], [address]   ENDOSCOPY PROCEDURE REPORT  PATIENT: Jerome Shepherd, Jerome Shepherd  MR#: [id number] BIRTHDATE: [birth year] , 47  yrs. old GENDER: Male ENDOSCOPIST: R.  Garfield Cornea, MD FACP FACG REFERRED BY:  Jerene Bears, M.D. PROCEDURE DATE:  01/10/2014 PROCEDURE:     EGD with Venia Minks dilation followed by gastric biopsy  INDICATIONS:    Chest pain; GERD; dysphagia; Dexilant 60 mg x3 days made no difference. Did not get Carafate filled-too expensive.  INFORMED CONSENT:   The risks, benefits, limitations, alternatives and imponderables have been discussed.  The potential for biopsy, esophogeal dilation, etc. have also been reviewed.  Questions have been answered.  All parties agreeable.  Please see the history and physical in the medical record for more information.  MEDICATIONS:    Versed 6 mg IV and Demerol 100 mg IV in divided doses.  Zofran 4 mg IV. Xylocaine gel orally  DESCRIPTION OF PROCEDURE:   The [id number] ([id number])  endoscope was introduced through the mouth and advanced to the second portion of the duodenum without difficulty or limitations.  The mucosal surfaces were surveyed very carefully during advancement of the scope and upon withdrawal.  Retroflexion view of the proximal stomach and esophagogastric junction was performed.      FINDINGS:   Completely normal appearing tubular esophagus. Normal esophageal mucosa.  Widely patent.  Stomach empty. No hiatal hernia. Couple of antral erosions otherwise normal gastric mucosa. Patent pylorus normal first and second portion of the duodenum  THERAPEUTIC / DIAGNOSTIC MANEUVERS PERFORMED:  A 56 French Maloney dilator was passed to full insertion easily. A look back revealed no apparent complication related to this maneuver. No trauma to the mucosa seen, whatsoever. Finally, biopsies of the abnormal antrum taken for histologic study.   COMPLICATIONS:  None  IMPRESSION:  Antral  erosions-trivial finding-likely has nothing to do with the patient's symptoms. Patient's symptoms far out of proportion to endoscopic findings as far as potential acid peptic disease is concerned.  RECOMMENDATIONS:  For now, we'll proceed with gallbladder workup starting with transabdominal ultrasound. I recommended he continue taking his Dexilant samples 60 mg daily. Further recommendations to follow.    _______________________________ R. Garfield Cornea, MD FACP [facility] eSigned:  R. Garfield Cornea, MD FACP [facility] 01/10/2014 3:42 PM     CC:  PATIENT NAME:  Jerome Shepherd, Jerome Shepherd MR#: [id number]

## 2014-01-12 ENCOUNTER — Ambulatory Visit (HOSPITAL_COMMUNITY)
Admit: 2014-01-12 | Discharge: 2014-01-12 | Disposition: A | Discharge status: Home / Self Care | Payer: No Typology Code available for payment source | Attending: Internal Medicine | Admitting: Internal Medicine

## 2014-01-12 DIAGNOSIS — K7689 Other specified diseases of liver: Secondary | ICD-10-CM | POA: Insufficient documentation

## 2014-01-12 DIAGNOSIS — R1011 Right upper quadrant pain: Secondary | ICD-10-CM

## 2014-01-13 ENCOUNTER — Telehealth: Payer: Self-pay | Admitting: Internal Medicine

## 2014-01-13 NOTE — Telephone Encounter (Signed)
Routing to AS

## 2014-01-13 NOTE — Telephone Encounter (Signed)
Patient calling wanting U/S results please advise?

## 2014-01-14 ENCOUNTER — Encounter (HOSPITAL_COMMUNITY): Payer: Self-pay | Admitting: Internal Medicine

## 2014-01-14 ENCOUNTER — Encounter: Payer: Self-pay | Admitting: Internal Medicine

## 2014-01-16 ENCOUNTER — Observation Stay (HOSPITAL_COMMUNITY)
Admission: EM | Admit: 2014-01-16 | Discharge: 2014-01-18 | Disposition: A | Discharge status: Home / Self Care | Payer: No Typology Code available for payment source | Attending: Internal Medicine | Admitting: Internal Medicine

## 2014-01-16 ENCOUNTER — Encounter (HOSPITAL_COMMUNITY): Payer: Self-pay | Admitting: Emergency Medicine

## 2014-01-16 ENCOUNTER — Emergency Department (HOSPITAL_COMMUNITY): Payer: No Typology Code available for payment source

## 2014-01-16 DIAGNOSIS — E119 Type 2 diabetes mellitus without complications: Secondary | ICD-10-CM | POA: Insufficient documentation

## 2014-01-16 DIAGNOSIS — I252 Old myocardial infarction: Secondary | ICD-10-CM | POA: Insufficient documentation

## 2014-01-16 DIAGNOSIS — Z87891 Personal history of nicotine dependence: Secondary | ICD-10-CM | POA: Insufficient documentation

## 2014-01-16 DIAGNOSIS — R079 Chest pain, unspecified: Secondary | ICD-10-CM | POA: Diagnosis present

## 2014-01-16 DIAGNOSIS — I2510 Atherosclerotic heart disease of native coronary artery without angina pectoris: Secondary | ICD-10-CM | POA: Diagnosis present

## 2014-01-16 DIAGNOSIS — R1310 Dysphagia, unspecified: Secondary | ICD-10-CM

## 2014-01-16 DIAGNOSIS — K5900 Constipation, unspecified: Secondary | ICD-10-CM | POA: Insufficient documentation

## 2014-01-16 DIAGNOSIS — I10 Essential (primary) hypertension: Secondary | ICD-10-CM | POA: Diagnosis present

## 2014-01-16 DIAGNOSIS — E669 Obesity, unspecified: Secondary | ICD-10-CM | POA: Diagnosis present

## 2014-01-16 DIAGNOSIS — R0789 Other chest pain: Principal | ICD-10-CM | POA: Insufficient documentation

## 2014-01-16 DIAGNOSIS — R9431 Abnormal electrocardiogram [ECG] [EKG]: Secondary | ICD-10-CM

## 2014-01-16 DIAGNOSIS — Z79899 Other long term (current) drug therapy: Secondary | ICD-10-CM | POA: Insufficient documentation

## 2014-01-16 DIAGNOSIS — N539 Unspecified male sexual dysfunction: Secondary | ICD-10-CM

## 2014-01-16 DIAGNOSIS — K219 Gastro-esophageal reflux disease without esophagitis: Secondary | ICD-10-CM | POA: Diagnosis present

## 2014-01-16 DIAGNOSIS — K259 Gastric ulcer, unspecified as acute or chronic, without hemorrhage or perforation: Secondary | ICD-10-CM | POA: Diagnosis present

## 2014-01-16 DIAGNOSIS — R1319 Other dysphagia: Secondary | ICD-10-CM

## 2014-01-16 DIAGNOSIS — G4730 Sleep apnea, unspecified: Secondary | ICD-10-CM | POA: Insufficient documentation

## 2014-01-16 DIAGNOSIS — R1013 Epigastric pain: Secondary | ICD-10-CM | POA: Insufficient documentation

## 2014-01-16 DIAGNOSIS — I209 Angina pectoris, unspecified: Secondary | ICD-10-CM | POA: Insufficient documentation

## 2014-01-16 DIAGNOSIS — Z9889 Other specified postprocedural states: Secondary | ICD-10-CM | POA: Insufficient documentation

## 2014-01-16 DIAGNOSIS — Z7982 Long term (current) use of aspirin: Secondary | ICD-10-CM | POA: Insufficient documentation

## 2014-01-16 HISTORY — DX: Acute myocardial infarction, unspecified: I21.9

## 2014-01-16 HISTORY — DX: Gastric ulcer, unspecified as acute or chronic, without hemorrhage or perforation: K25.9

## 2014-01-16 LAB — BASIC METABOLIC PANEL
Anion gap: 13 (ref 5–15)
BUN: 10 mg/dL (ref 6–23)
CO2: 23 mEq/L (ref 19–32)
Calcium: 9.3 mg/dL (ref 8.4–10.5)
Chloride: 102 mEq/L (ref 96–112)
Creatinine, Ser: 0.91 mg/dL (ref 0.50–1.35)
Glucose, Bld: 315 mg/dL — ABNORMAL HIGH (ref 70–99)
POTASSIUM: 4 meq/L (ref 3.7–5.3)
Sodium: 138 mEq/L (ref 137–147)

## 2014-01-16 LAB — CBC
HCT: 44.2 % (ref 39.0–52.0)
HEMOGLOBIN: 16.1 g/dL (ref 13.0–17.0)
MCH: 31.7 pg (ref 26.0–34.0)
MCHC: 36.4 g/dL — AB (ref 30.0–36.0)
MCV: 87 fL (ref 78.0–100.0)
Platelets: 215 10*3/uL (ref 150–400)
RBC: 5.08 MIL/uL (ref 4.22–5.81)
RDW: 12.3 % (ref 11.5–15.5)
WBC: 7.4 10*3/uL (ref 4.0–10.5)

## 2014-01-16 LAB — TROPONIN I

## 2014-01-16 MED ORDER — GI COCKTAIL ~~LOC~~
30.0000 mL | Freq: Once | ORAL | Status: AC
  Administered 2014-01-16: 30 mL via ORAL
  Filled 2014-01-16: qty 30

## 2014-01-16 MED ORDER — FAMOTIDINE 20 MG PO TABS
20.0000 mg | ORAL_TABLET | Freq: Once | ORAL | Status: AC
  Administered 2014-01-16: 20 mg via ORAL
  Filled 2014-01-16: qty 1

## 2014-01-16 MED ORDER — NITROGLYCERIN 0.4 MG SL SUBL
0.4000 mg | SUBLINGUAL_TABLET | Freq: Once | SUBLINGUAL | Status: AC
  Administered 2014-01-16: 0.4 mg via SUBLINGUAL
  Filled 2014-01-16: qty 1

## 2014-01-16 NOTE — ED Notes (Signed)
Patient states that he started having pain in chest around 8pm this evening when he was resting. At this time patient states that he is pain free. Patient states that he has been having this pain x 1 year on and off

## 2014-01-16 NOTE — ED Provider Notes (Signed)
CSN: [id number]     Arrival date & time 01/16/14  2121 History  This chart was scribed for Mariea Clonts, MD by Jeanell Sparrow, ED Scribe. This patient was seen in room APA09/APA09 and the patient's care was started at 11:30 PM.  Chief Complaint  Patient presents with  . Chest Pain   The history is provided by the patient.   HPI Comments: Jerome Shepherd is a 48 y.o. male with a hx of MI and DM who presents to the Emergency Department complaining of moderate constant substernal chest pain that started about 4 hours ago. Similar intermittent for 1 year. He reports associated heartburn and indigestion. He states that the pain radiates to the epigastric area. He describes the pain as a dull feeling. He has had worsening sxs after eating fatty foods.  He reports that he had an endoscopy searching for a hiatal hernia six days ago. He reports no recent surgeries. He denies any new back pain, abdominal pain, leg swelling, rash, dysuria, hemoptysis, SOB, fever, or visual disturbance.   Past Medical History  Diagnosis Date  . Diabetes mellitus   . Sleep apnea   . Coronary artery spasm 2013  . CAD (coronary artery disease), minimal, non obstructive 2013 01/01/2013  . Hypertension   . Constipation    Past Surgical History  Procedure Laterality Date  . No past surgeries    . Cardiac catheterization  2013    non obstructive CAD  ~20%  . Esophagogastroduodenoscopy N/A 01/10/2014    Procedure: ESOPHAGOGASTRODUODENOSCOPY (EGD);  Surgeon: Daneil Dolin, MD;  [facility];  Service: Endoscopy;  Laterality: N/A;  2:45  . Savory dilation N/A 01/10/2014    Procedure: SAVORY DILATION;  Surgeon: Daneil Dolin, MD;  Location: [facility];  Service: Endoscopy;  Laterality: N/A;  Venia Minks dilation N/A 01/10/2014    Procedure: Venia Minks DILATION;  Surgeon: Daneil Dolin, MD;  Location: [facility];  Service: Endoscopy;  Laterality: N/A;   Family History  Problem Relation Age of Onset  . Diabetes  type II Sister   . Heart attack Cousin   . Colon cancer Neg Hx   . Stomach cancer Neg Hx    History  Substance Use Topics  . Smoking status: Former Smoker    Quit date: 08/18/1995  . Smokeless tobacco: Not on file     Comment: Quit smoking x 20 years  . Alcohol Use: No    Review of Systems  Constitutional: Negative for fever and diaphoresis.  HENT: Negative for sore throat.   Eyes: Negative for visual disturbance.  Respiratory: Negative for shortness of breath.   Cardiovascular: Positive for chest pain. Negative for leg swelling.  Gastrointestinal: Positive for abdominal pain. Negative for blood in stool.  Genitourinary: Negative for dysuria.  Musculoskeletal: Negative for back pain.  Skin: Negative for rash.  All other systems reviewed and are negative.   Allergies  Codeine and Sulfa antibiotics  Home Medications   Prior to Admission medications   Medication Sig Start Date End Date Taking? Authorizing Provider  isosorbide mononitrate (IMDUR) 30 MG 24 hr tablet Take 30 mg by mouth daily.   Yes Historical Provider, MD  simvastatin (ZOCOR) 40 MG tablet Take 40 mg by mouth every evening.   Yes Historical Provider, MD  amLODipine (NORVASC) 10 MG tablet Take 1 tablet (10 mg total) by mouth daily. 08/23/13   Herminio Commons, MD  aspirin 81 MG chewable tablet Chew 1 tablet (81 mg  total) by mouth daily. 01/01/13   Varney Biles, MD  diphenhydrAMINE (BENADRYL) 50 MG capsule Take 50 mg by mouth at bedtime as needed for itching.    Historical Provider, MD  glyBURIDE micronized (GLYNASE) 6 MG tablet Take 6 mg by mouth daily with breakfast.     Historical Provider, MD  Magnesium Gluconate 27.5 MG TABS Take 1 tablet by mouth as needed. cramps    Historical Provider, MD  metFORMIN (GLUCOPHAGE) 500 MG tablet Take 500 mg by mouth daily with breakfast.     Historical Provider, MD  pantoprazole (PROTONIX) 20 MG tablet Take 1 tablet (20 mg total) by mouth 2 (two) times daily. 11/25/13   Tanna Furry, MD  sucralfate (CARAFATE) 1 GM/10ML suspension Take 10 mLs (1 g total) by mouth 4 (four) times daily. 01/06/14   Orvil Feil, NP   BP 140/84  Pulse 68  Temp(Src) 98 F (36.7 C) (Oral)  Resp 20  Ht 6\' 4"  (1.93 m)  Wt 309 lb (140.161 kg)  BMI 37.63 kg/m2  SpO2 91% Physical Exam  Nursing note and vitals reviewed. Constitutional: He is oriented to person, place, and time. He appears well-developed and well-nourished. No distress.  HENT:  Head: Normocephalic and atraumatic.  Moist mucous membranes.   Eyes: Pupils are equal, round, and reactive to light.  Neck: Neck supple. No tracheal deviation present.  Cardiovascular: Normal rate, regular rhythm and intact distal pulses.   No murmur heard. Pulmonary/Chest: Effort normal. No respiratory distress.  Abdominal: Soft. Bowel sounds are normal. There is no tenderness. There is no guarding.  Musculoskeletal: Normal range of motion.  No calf tenderness  Neurological: He is alert and oriented to person, place, and time.  Skin: Skin is warm and dry. No rash noted.  Psychiatric: He has a normal mood and affect. His behavior is normal.    ED Course  Procedures (including critical care time) DIAGNOSTIC STUDIES: Oxygen Saturation is 91% on RA, normal by my interpretation.    COORDINATION OF CARE: 11:34 PM- Pt advised of plan for treatment which includes medication, radiology, and labs and pt agrees.  Labs Review Labs Reviewed  CBC - Abnormal; Notable for the following:    MCHC 36.4 (*)    All other components within normal limits  BASIC METABOLIC PANEL - Abnormal; Notable for the following:    Glucose, Bld 315 (*)    All other components within normal limits  TROPONIN I  LIPASE, BLOOD  HEPATIC FUNCTION PANEL  TROPONIN I    Imaging Review Dg Chest 2 View  01/16/2014   CLINICAL DATA:  Epigastric and central chest pain.  EXAM: CHEST  2 VIEW  COMPARISON:  11/25/2013  FINDINGS: The heart size and mediastinal contours are within  normal limits. Both lungs are clear. The visualized skeletal structures are unremarkable.  IMPRESSION: No active cardiopulmonary disease.   Electronically Signed   By: Lucienne Capers M.D.   On: 01/16/2014 22:36     EKG Interpretation   Date/Time:  Sunday January 16 2014 22:43:39 EDT Ventricular Rate:  65 PR Interval:  196 QRS Duration: 108 QT Interval:  401 QTC Calculation: 417 R Axis:   10 Text Interpretation:  Sinus rhythm Probable anteroseptal infarct, old No  significant change to previous Confirmed by Mercia Dowe  MD, Jahad Old ([id number]) on  01/16/2014 11:05:39 PM      MDM   Final diagnoses:  Atypical chest pain  Epigastric pain   I personally performed the services described in this documentation,  which was scribed in my presence. The recorded information has been reviewed and is accurate.  Patient presents with intermittent atypical epigastric and chest pain is begun on from a year now. Patient has both the gastroenterologist and a heart Dr. who saw him closely. Patient did have small heart attack from vascular spasm and no issues since. Reviewed recent ultrasound gallbladder unremarkable. Patient has no blood clot risk factors, POC negative.  Patient is moderate risk cardiac however his symptoms have been for almost a year, more consistent with GI as worse after eating, delta troponin negative, EKG no acute changes since previous and patient has close followup with specialist.  Results and differential diagnosis were discussed with the patient/parent/guardian. Close follow up outpatient was discussed, comfortable with the plan.   Medications  famotidine (PEPCID) tablet 20 mg (20 mg Oral Given 01/16/14 2352)  gi cocktail (Maalox,Lidocaine,Donnatal) (30 mLs Oral Given 01/16/14 2352)  nitroGLYCERIN (NITROSTAT) SL tablet 0.4 mg (0.4 mg Sublingual Given 01/16/14 2351)    Filed Vitals:   01/16/14 2330 01/16/14 2345 01/17/14 0000 01/17/14 0016  BP: 147/75  130/78   Pulse: 59 60 66 60   Temp:   98 F (36.7 C)   TempSrc:   Oral   Resp: 19  15 15   Height:      Weight:      SpO2: 95%  90% 95%       Mariea Clonts, MD 01/17/14 0151

## 2014-01-16 NOTE — ED Notes (Signed)
History of MI 2.5 years ago, endoscopy Monday, [place] on wed. Looking for hiatal hernia. Now pt has severe heartburn, indigestion, and chest pain.

## 2014-01-17 ENCOUNTER — Telehealth: Payer: Self-pay | Admitting: *Deleted

## 2014-01-17 ENCOUNTER — Encounter (HOSPITAL_COMMUNITY): Payer: Self-pay | Admitting: *Deleted

## 2014-01-17 ENCOUNTER — Observation Stay (HOSPITAL_COMMUNITY): Payer: No Typology Code available for payment source

## 2014-01-17 DIAGNOSIS — E669 Obesity, unspecified: Secondary | ICD-10-CM

## 2014-01-17 DIAGNOSIS — R079 Chest pain, unspecified: Secondary | ICD-10-CM | POA: Diagnosis present

## 2014-01-17 DIAGNOSIS — K219 Gastro-esophageal reflux disease without esophagitis: Secondary | ICD-10-CM

## 2014-01-17 DIAGNOSIS — K259 Gastric ulcer, unspecified as acute or chronic, without hemorrhage or perforation: Secondary | ICD-10-CM

## 2014-01-17 DIAGNOSIS — I10 Essential (primary) hypertension: Secondary | ICD-10-CM

## 2014-01-17 DIAGNOSIS — R0789 Other chest pain: Secondary | ICD-10-CM

## 2014-01-17 HISTORY — DX: Gastric ulcer, unspecified as acute or chronic, without hemorrhage or perforation: K25.9

## 2014-01-17 LAB — HEPATIC FUNCTION PANEL
ALT: 24 U/L (ref 0–53)
AST: 37 U/L (ref 0–37)
Albumin: 3.8 g/dL (ref 3.5–5.2)
Alkaline Phosphatase: 78 U/L (ref 39–117)
Total Bilirubin: 0.3 mg/dL (ref 0.3–1.2)
Total Protein: 6.6 g/dL (ref 6.0–8.3)

## 2014-01-17 LAB — GLUCOSE, CAPILLARY
Glucose-Capillary: 222 mg/dL — ABNORMAL HIGH (ref 70–99)
Glucose-Capillary: 163 mg/dL — ABNORMAL HIGH (ref 70–99)
Glucose-Capillary: 240 mg/dL — ABNORMAL HIGH (ref 70–99)

## 2014-01-17 LAB — LIPASE, BLOOD: LIPASE: 56 U/L (ref 11–59)

## 2014-01-17 LAB — HEMOGLOBIN A1C
Hgb A1c MFr Bld: 9.2 % — ABNORMAL HIGH (ref <5.7)
Mean Plasma Glucose: 217 mg/dL — ABNORMAL HIGH (ref <117)

## 2014-01-17 LAB — TROPONIN I
Troponin I: <0.3 ng/mL (ref <0.30)
Troponin I: <0.3 ng/mL (ref <0.30)

## 2014-01-17 MED ORDER — METFORMIN HCL 500 MG PO TABS
500.0000 mg | ORAL_TABLET | Freq: Two times a day (BID) | ORAL | Status: DC
  Administered 2014-01-17 – 2014-01-18 (×2): 500 mg via ORAL
  Filled 2014-01-17 (×2): qty 1

## 2014-01-17 MED ORDER — ISOSORBIDE MONONITRATE ER 60 MG PO TB24
30.0000 mg | ORAL_TABLET | Freq: Every day | ORAL | Status: DC
  Administered 2014-01-18: 30 mg via ORAL
  Filled 2014-01-17: qty 1

## 2014-01-17 MED ORDER — ASPIRIN 81 MG PO CHEW
81.0000 mg | CHEWABLE_TABLET | Freq: Every day | ORAL | Status: DC
  Administered 2014-01-18: 81 mg via ORAL
  Filled 2014-01-17: qty 1

## 2014-01-17 MED ORDER — NITROGLYCERIN 0.4 MG SL SUBL
0.4000 mg | SUBLINGUAL_TABLET | SUBLINGUAL | Status: DC | PRN
  Administered 2014-01-17: 0.4 mg via SUBLINGUAL

## 2014-01-17 MED ORDER — AMLODIPINE BESYLATE 5 MG PO TABS
10.0000 mg | ORAL_TABLET | Freq: Every day | ORAL | Status: DC
  Administered 2014-01-18: 10 mg via ORAL
  Filled 2014-01-17: qty 2

## 2014-01-17 MED ORDER — PANTOPRAZOLE SODIUM 20 MG PO TBEC
20.0000 mg | DELAYED_RELEASE_TABLET | Freq: Two times a day (BID) | ORAL | Status: DC
  Filled 2014-01-17: qty 1

## 2014-01-17 MED ORDER — GI COCKTAIL ~~LOC~~: 30.0000 mL | Freq: Four times a day (QID) | ORAL | Status: DC | PRN

## 2014-01-17 MED ORDER — ACETAMINOPHEN 325 MG PO TABS
650.0000 mg | ORAL_TABLET | ORAL | Status: DC | PRN
Start: 2014-01-17 — End: 2014-01-18

## 2014-01-17 MED ORDER — SIMVASTATIN 20 MG PO TABS
40.0000 mg | ORAL_TABLET | Freq: Every evening | ORAL | Status: DC
  Administered 2014-01-17: 40 mg via ORAL
  Filled 2014-01-17: qty 2

## 2014-01-17 MED ORDER — ASPIRIN 81 MG PO CHEW
324.0000 mg | CHEWABLE_TABLET | Freq: Once | ORAL | Status: AC
  Administered 2014-01-17: 324 mg via ORAL
  Filled 2014-01-17: qty 4

## 2014-01-17 MED ORDER — DIPHENHYDRAMINE HCL 25 MG PO CAPS
50.0000 mg | ORAL_CAPSULE | Freq: Every evening | ORAL | Status: DC | PRN
  Administered 2014-01-17: 50 mg via ORAL
  Filled 2014-01-17: qty 2

## 2014-01-17 MED ORDER — SODIUM CHLORIDE 0.9 % IJ SOLN
INTRAMUSCULAR | Status: AC
  Filled 2014-01-17: qty 30

## 2014-01-17 MED ORDER — INSULIN ASPART 100 UNIT/ML ~~LOC~~ SOLN
0.0000 [IU] | Freq: Three times a day (TID) | SUBCUTANEOUS | Status: DC
  Administered 2014-01-17: 3 [IU] via SUBCUTANEOUS
  Administered 2014-01-18: 5 [IU] via SUBCUTANEOUS
  Administered 2014-01-18: 8 [IU] via SUBCUTANEOUS

## 2014-01-17 MED ORDER — ONDANSETRON HCL 4 MG/2ML IJ SOLN: 4.0000 mg | Freq: Three times a day (TID) | INTRAMUSCULAR | Status: DC | PRN

## 2014-01-17 MED ORDER — SINCALIDE 5 MCG IJ SOLR
INTRAMUSCULAR | Status: AC
  Administered 2014-01-17: 2.86 ug via INTRAVENOUS
  Filled 2014-01-17: qty 5

## 2014-01-17 MED ORDER — TECHNETIUM TC 99M MEBROFENIN IV KIT
5.0000 | PACK | Freq: Once | INTRAVENOUS | Status: AC | PRN
  Administered 2014-01-17: 5 via INTRAVENOUS

## 2014-01-17 MED ORDER — PANTOPRAZOLE SODIUM 40 MG PO TBEC
40.0000 mg | DELAYED_RELEASE_TABLET | Freq: Two times a day (BID) | ORAL | Status: DC
  Administered 2014-01-17 – 2014-01-18 (×2): 40 mg via ORAL
  Filled 2014-01-17 (×2): qty 1

## 2014-01-17 MED ORDER — SUCRALFATE 1 GM/10ML PO SUSP
1.0000 g | Freq: Three times a day (TID) | ORAL | Status: DC
  Administered 2014-01-17 – 2014-01-18 (×5): 1 g via ORAL
  Filled 2014-01-17 (×5): qty 10

## 2014-01-17 MED ORDER — ONDANSETRON HCL 4 MG/2ML IJ SOLN: 4.0000 mg | Freq: Four times a day (QID) | INTRAMUSCULAR | Status: DC | PRN

## 2014-01-17 MED ORDER — HYDROMORPHONE HCL PF 1 MG/ML IJ SOLN
0.5000 mg | Freq: Once | INTRAMUSCULAR | Status: AC
  Administered 2014-01-17: 1 mg via INTRAVENOUS
  Filled 2014-01-17: qty 1

## 2014-01-17 MED ORDER — STERILE WATER FOR INJECTION IJ SOLN
INTRAMUSCULAR | Status: AC
  Administered 2014-01-17: 5 mL via INTRAVENOUS
  Filled 2014-01-17: qty 10

## 2014-01-17 MED ORDER — INSULIN ASPART 100 UNIT/ML ~~LOC~~ SOLN
0.0000 [IU] | Freq: Every day | SUBCUTANEOUS | Status: DC
  Administered 2014-01-17: 2 [IU] via SUBCUTANEOUS

## 2014-01-17 NOTE — Progress Notes (Signed)
The patient is a 48 year old man with a history of nonobstructive CAD, diabetes mellitus, GERD, and obesity, who was admitted to the emergency department this morning by Dr. Shanon Brow for recurrent chest pain/epigastric pain. He was briefly seen and examined. His chart, vital signs, and laboratory studies were reviewed. Agree with Dr. Camelia Eng management. We'll add sliding scale NovoLog and restart metformin for treatment of his diabetes. We'll hold glyburide. We'll order a hemoglobin A1c to assess glycemic and: Home. We'll order TSH 4 bradycardia at rest. Will followup on the results of the HIDA scan pending. If negative, we'll consult cardiology for a potential stress test.

## 2014-01-17 NOTE — H&P (Signed)
PCP:   Glenda Chroman., MD   Chief Complaint:  Chest pain  HPI: 48 yo male h/o nonobs CAD per cath 2013, gerd, dm, htn, obesity comes in with recurrent chest pain/epigastric pain.  Pt has been having this pain on/off for a year, it comes on about an hour after eating (occurs with nonfatty foods and fatty foods) burning sensation in his chest mid that radiates down to his epigastrum.  The pain is usually relieved with getting up and walking around.  The pain usually occurs when he goes to lie down, and lying down makes the pain worse.  No n/v.  No fevers.  No sob.  He has been seeing cards and gi as outpt, had abd u/s last week unrevealing.  Told the next step would be a HIDA scan, if that is normal then cards will do stress testing.    Review of Systems:  Positive and negative as per HPI otherwise all other systems are negative  Past Medical History: Past Medical History  Diagnosis Date  . Diabetes mellitus   . Sleep apnea   . Coronary artery spasm 2013  . CAD (coronary artery disease), minimal, non obstructive 2013 01/01/2013  . Hypertension   . Constipation    Past Surgical History  Procedure Laterality Date  . No past surgeries    . Cardiac catheterization  2013    non obstructive CAD  ~20%  . Esophagogastroduodenoscopy N/A 01/10/2014    Procedure: ESOPHAGOGASTRODUODENOSCOPY (EGD);  Surgeon: Daneil Dolin, MD;  [facility];  Service: Endoscopy;  Laterality: N/A;  2:45  . Savory dilation N/A 01/10/2014    Procedure: SAVORY DILATION;  Surgeon: Daneil Dolin, MD;  [facility];  Service: Endoscopy;  Laterality: N/A;  Venia Minks dilation N/A 01/10/2014    Procedure: Venia Minks DILATION;  Surgeon: Daneil Dolin, MD;  [facility]: [facility];  Service: Endoscopy;  Laterality: N/A;    Medications: Prior to Admission medications   Medication Sig Start Date End Date Taking? Authorizing Provider  isosorbide mononitrate (IMDUR) 30 MG 24 hr tablet Take 30 mg by mouth  daily.   Yes Historical Provider, MD  simvastatin (ZOCOR) 40 MG tablet Take 40 mg by mouth every evening.   Yes Historical Provider, MD  amLODipine (NORVASC) 10 MG tablet Take 1 tablet (10 mg total) by mouth daily. 08/23/13   Herminio Commons, MD  aspirin 81 MG chewable tablet Chew 1 tablet (81 mg total) by mouth daily. 01/01/13   Varney Biles, MD  diphenhydrAMINE (BENADRYL) 50 MG capsule Take 50 mg by mouth at bedtime as needed for itching.    Historical Provider, MD  glyBURIDE micronized (GLYNASE) 6 MG tablet Take 6 mg by mouth daily with breakfast.     Historical Provider, MD  Magnesium Gluconate 27.5 MG TABS Take 1 tablet by mouth as needed. cramps    Historical Provider, MD  metFORMIN (GLUCOPHAGE) 500 MG tablet Take 500 mg by mouth daily with breakfast.     Historical Provider, MD  pantoprazole (PROTONIX) 20 MG tablet Take 1 tablet (20 mg total) by mouth 2 (two) times daily. 11/25/13   Tanna Furry, MD  sucralfate (CARAFATE) 1 GM/10ML suspension Take 10 mLs (1 g total) by mouth 4 (four) times daily. 01/06/14   Orvil Feil, NP    Allergies:   Allergies  Allergen Reactions  . Codeine Nausea Only  . Sulfa Antibiotics Nausea Only    Social History:  reports that he quit smoking  about 18 years ago. He does not have any smokeless tobacco history on file. He reports that he does not drink alcohol or use illicit drugs.  Family History: Family History  Problem Relation Age of Onset  . Diabetes type II Sister   . Heart attack Cousin   . Colon cancer Neg Hx   . Stomach cancer Neg Hx     Physical Exam: Filed Vitals:   01/16/14 2330 01/16/14 2345 01/17/14 0000 01/17/14 0016  BP: 147/75  130/78   Pulse: 59 60 66 60  Temp:   98 F (36.7 C)   TempSrc:   Oral   Resp: 19  15 15   Height:      Weight:      SpO2: 95%  90% 95%   General appearance: alert, cooperative and no distress Head: Normocephalic, without obvious abnormality, atraumatic Eyes: negative Nose: Nares normal. Septum  midline. Mucosa normal. No drainage or sinus tenderness. Neck: no JVD and supple, symmetrical, trachea midline Lungs: clear to auscultation bilaterally Heart: regular rate and rhythm, S1, S2 normal, no murmur, click, rub or gallop Abdomen: soft, non-tender; bowel sounds normal; no masses,  no organomegaly Extremities: extremities normal, atraumatic, no cyanosis or edema Pulses: 2+ and symmetric Skin: Skin color, texture, turgor normal. No rashes or lesions Neurologic: Grossly normal    Labs on Admission:   Recent Labs  01/16/14 2148  NA 138  K 4.0  CL 102  CO2 23  GLUCOSE 315*  BUN 10  CREATININE 0.91  CALCIUM 9.3    Recent Labs  01/17/14 0011  AST 37  ALT 24  ALKPHOS 78  BILITOT 0.3  PROT 6.6  ALBUMIN 3.8    Recent Labs  01/17/14 0011  LIPASE 56    Recent Labs  01/16/14 2148  WBC 7.4  HGB 16.1  HCT 44.2  MCV 87.0  PLT 215    Recent Labs  01/16/14 2148 01/17/14 0011  TROPONINI <0.30 <0.30   Radiological Exams on Admission: Dg Chest 2 View  01/16/2014   CLINICAL DATA:  Epigastric and central chest pain.  EXAM: CHEST  2 VIEW  COMPARISON:  11/25/2013  FINDINGS: The heart size and mediastinal contours are within normal limits. Both lungs are clear. The visualized skeletal structures are unremarkable.  IMPRESSION: No active cardiopulmonary disease.   Electronically Signed   By: Lucienne Capers M.D.   On: 01/16/2014 22:36   US Abdomen Limited Ruq  01/12/2014   CLINICAL DATA:  One year history of right upper quadrant pain  EXAM: US ABDOMEN LIMITED - RIGHT UPPER QUADRANT  COMPARISON:  None.  FINDINGS: Gallbladder:  No gallstones or wall thickening visualized. No sonographic Murphy sign noted.  Common bile duct:  Diameter: Within normal limits at 4.8 mm.  Liver:  Enlarged, heterogeneous and markedly echogenic hepatic parenchyma. The liver measures approximately 20 cm in craniocaudal dimension. Evaluation is limited secondary to patient body habitus and the  dense, echogenic nature of the hepatic parenchyma. The main portal vein is patent with antegrade flow. The adjacent kidney appears markedly hypoechoic in comparison to the liver consistent with advanced steatosis.  IMPRESSION: 1. Advanced hepatic steatosis. 2. No acute abnormality identified.   Electronically Signed   By: Jacqulynn Cadet M.D.   On: 01/12/2014 13:45    Assessment/Plan  48 yo male with atypical chest pain on/off for a year  Principal Problem:   Chest pain-  Has been seen by both cardiology and GI team outpt, plan was to get HIDA  scan, and if that is negative, cardiology will do stress testing.  Order hida now.  On dexilant by gi.    Active Problems:  Stable unless o/w noted.   Diabetes mellitus   Obesity   CAD (coronary artery disease), minimal, non obstructive 2013   GERD (gastroesophageal reflux disease)   Esophageal dysphagia, with solids only, leading to chest pain   HTN (hypertension)  obs on tele.  Full code.  Vonnie Ligman A 01/17/2014, 2:34 AM

## 2014-01-17 NOTE — Telephone Encounter (Signed)
Tried to call pt- LMOM 

## 2014-01-17 NOTE — Telephone Encounter (Signed)
Pt called wanting to know about his results from his ultra sound. Please advise [phone]

## 2014-01-17 NOTE — Progress Notes (Signed)
Patient requested cell phone charger from car in parking lot.  Security called.  Patient gave security car keys ( only 1 key on ring) security retrieved charger from car and returned charger and key to patient. Patient content with outcome.

## 2014-01-17 NOTE — Progress Notes (Signed)
Patient 's CBG 222,no coverage at this time,patient is NPO for procedure,Dr Fisher notified.Will continue to monitor patient.

## 2014-01-17 NOTE — Telephone Encounter (Signed)
Please see result note by RMR.

## 2014-01-17 NOTE — Progress Notes (Signed)
Utilization review completed.  

## 2014-01-18 ENCOUNTER — Telehealth: Payer: Self-pay | Admitting: Gastroenterology

## 2014-01-18 ENCOUNTER — Encounter (HOSPITAL_COMMUNITY): Payer: Self-pay | Admitting: Gastroenterology

## 2014-01-18 DIAGNOSIS — E119 Type 2 diabetes mellitus without complications: Secondary | ICD-10-CM

## 2014-01-18 DIAGNOSIS — R1013 Epigastric pain: Secondary | ICD-10-CM

## 2014-01-18 LAB — BASIC METABOLIC PANEL
Anion gap: 12 (ref 5–15)
BUN: 11 mg/dL (ref 6–23)
CO2: 25 mEq/L (ref 19–32)
Calcium: 9.3 mg/dL (ref 8.4–10.5)
Chloride: 100 mEq/L (ref 96–112)
Creatinine, Ser: 0.91 mg/dL (ref 0.50–1.35)
GFR calc Af Amer: >90 mL/min (ref >90)
GFR calc non Af Amer: >90 mL/min (ref >90)
GLUCOSE: 260 mg/dL — AB (ref 70–99)
POTASSIUM: 4.5 meq/L (ref 3.7–5.3)
Sodium: 137 mEq/L (ref 137–147)

## 2014-01-18 LAB — GLUCOSE, CAPILLARY
Glucose-Capillary: 202 mg/dL — ABNORMAL HIGH (ref 70–99)
Glucose-Capillary: 253 mg/dL — ABNORMAL HIGH (ref 70–99)

## 2014-01-18 LAB — CBC
HCT: 53.4 % — ABNORMAL HIGH (ref 39.0–52.0)
Hemoglobin: 19.4 g/dL — ABNORMAL HIGH (ref 13.0–17.0)
MCH: 31.5 pg (ref 26.0–34.0)
MCHC: 36.3 g/dL — ABNORMAL HIGH (ref 30.0–36.0)
MCV: 86.8 fL (ref 78.0–100.0)
Platelets: 249 10*3/uL (ref 150–400)
RBC: 6.15 MIL/uL — ABNORMAL HIGH (ref 4.22–5.81)
RDW: 12.4 % (ref 11.5–15.5)
WBC: 8 10*3/uL (ref 4.0–10.5)

## 2014-01-18 LAB — TSH: TSH: 3.91 u[IU]/mL (ref 0.350–4.500)

## 2014-01-18 MED ORDER — PANTOPRAZOLE SODIUM 40 MG PO TBEC: 40.0000 mg | DELAYED_RELEASE_TABLET | Freq: Two times a day (BID) | ORAL | Status: DC

## 2014-01-18 MED ORDER — SUCRALFATE 1 G PO TABS: 1.0000 g | ORAL_TABLET | Freq: Three times a day (TID) | ORAL | Status: DC

## 2014-01-18 NOTE — Progress Notes (Signed)
cc'd to pcp 

## 2014-01-18 NOTE — Care Management Note (Signed)
    Page 1 of 1   01/18/2014     2:51:45 PM CARE MANAGEMENT NOTE 01/18/2014  Patient:  Jerome Shepherd, Jerome Shepherd   Account Number:  [id number]  Date Initiated:  01/18/2014  Documentation initiated by:  Vladimir Creeks  Subjective/Objective Assessment:   Admitted with chest pain, R/O MI and Gall bladder disease. Pt is from home, is independent and will return home at D/C     Action/Plan:   no needs identified   Anticipated DC Date:  01/18/2014   Anticipated DC Plan:  [place]  CM consult      Choice offered to / List presented to:             Status of service:  Completed, signed off Medicare Important Message given?   (If response is "NO", the following Medicare IM given date fields will be blank) Date Medicare IM given:   Medicare IM given by:   Date Additional Medicare IM given:   Additional Medicare IM given by:    Discharge Disposition:  HOME/SELF CARE  Per UR Regulation:  Reviewed for med. necessity/level of care/duration of stay  If discussed at [place] of Stay Meetings, dates discussed:    Comments:  01/18/14 [place] RN/CM

## 2014-01-18 NOTE — Discharge Summary (Signed)
The patient was seen and examined. His chart, vital signs, laboratory studies were reviewed. He was discussed with the nurse practitioner, Ms. Renard Hamper. Agree with present plan. The patient is a 48 year old man with recurrent chest pain/epigastric pain who had previous outpatient workup for the same. During this hospitalization, he was ruled out for myocardial infarction. His lipase and liver transaminases were within normal limits. His HIDA scan revealed a mildly reduced ejection fraction. General surgeon, Dr. Arnoldo Morale was consulted for the possible need for a cholecystectomy. Per Dr. Arnoldo Morale assessment, the patient's symptoms were somewhat atypical for biliary colic. He will discuss further with GI, but would only do a laparoscopic cholecystectomy as a last resort. The patient's primary gastroenterology team was consulted and recommended twice a day dosing of Protonix and a trial of Carafate. The patient was also instructed and encouraged to lose weight and to follow a low-fat diet. He was also scheduled to followup with cardiology for evaluation of the need for a stress test in the outpatient setting. At the time of discharge, the patient was afebrile, hemodynamically stable, and without chest pain. He would also need further management by his PCP to optimize his glycemic control.

## 2014-01-18 NOTE — Telephone Encounter (Signed)
Late entry- tried to call pt yesterday- LMOM

## 2014-01-18 NOTE — Consult Note (Signed)
Reason for Consult: Recurrent chest pain Referring Physician: Hospitalist, Dr. Jabier Gauss is an 48 y.o. male.  HPI: Patient is a 48 year old white male who is had multiple episodes of chest pain over the past year. Workup by cardiology as well as GI has only shown evidence of reflux disease. He did have an EGD with esophageal dilatation one week ago. He states that when he hasn't attack, it starts in the upper sternum region and extends down to the epigastric region. He denies any right upper quadrant abdominal pain, fatty food intolerance, fever, chills, nausea, or jaundice. He had an ultrasound the gallbladder which was negative. HIDA scan revealed a borderline gallbladder ejection fraction, but no reproducible symptoms with CCK injection.  Past Medical History  Diagnosis Date  . Diabetes mellitus   . Sleep apnea   . Coronary artery spasm 2013  . CAD (coronary artery disease), minimal, non obstructive 2013 01/01/2013  . Hypertension   . Constipation   . Myocardial infarction   . Antral erosion 01/17/2014    Per EGD 01/10/14- not likely the cause of his symtoms     Past Surgical History  Procedure Laterality Date  . No past surgeries    . Cardiac catheterization  2013    non obstructive CAD  ~20%  . Esophagogastroduodenoscopy N/A 01/10/2014    Dr. Gala Romney: antral erosions, path with chronic inactive gastritis. Empiric dilation with 56-F dilation  . Savory dilation N/A 01/10/2014    Procedure: SAVORY DILATION;  Surgeon: Daneil Dolin, MD;  Location: [facility];  Service: Endoscopy;  Laterality: N/A;  Venia Minks dilation N/A 01/10/2014    Procedure: Venia Minks DILATION;  Surgeon: Daneil Dolin, MD;  Location: [facility];  Service: Endoscopy;  Laterality: N/A;    Family History  Problem Relation Age of Onset  . Diabetes type II Sister   . Heart attack Cousin   . Colon cancer Neg Hx   . Stomach cancer Neg Hx     Social History:  reports that he quit smoking about 18 years  ago. He does not have any smokeless tobacco history on file. He reports that he does not drink alcohol or use illicit drugs.  Allergies:  Allergies  Allergen Reactions  . Codeine Nausea Only  . Sulfa Antibiotics Nausea Only    Medications: I have reviewed the patient's current medications.  Results for orders placed during the hospital encounter of 01/16/14 (from the past 48 hour(s))  TSH     Status: None   Collection Time    01/16/14  9:44 PM      Result Value Ref Range   TSH 3.910  0.350 - 4.500 uIU/mL   Comment: Performed at [facility]  CBC     Status: Abnormal   Collection Time    01/16/14  9:48 PM      Result Value Ref Range   WBC 7.4  4.0 - 10.5 K/uL   RBC 5.08  4.22 - 5.81 MIL/uL   Hemoglobin 16.1  13.0 - 17.0 g/dL   HCT 44.2  39.0 - 52.0 %   MCV 87.0  78.0 - 100.0 fL   MCH 31.7  26.0 - 34.0 pg   MCHC 36.4 (*) 30.0 - 36.0 g/dL   RDW 12.3  11.5 - 15.5 %   Platelets 215  150 - 400 K/uL  BASIC METABOLIC PANEL     Status: Abnormal   Collection Time    01/16/14  9:48 PM  Result Value Ref Range   Sodium 138  137 - 147 mEq/L   Potassium 4.0  3.7 - 5.3 mEq/L   Chloride 102  96 - 112 mEq/L   CO2 23  19 - 32 mEq/L   Glucose, Bld 315 (*) 70 - 99 mg/dL   BUN 10  6 - 23 mg/dL   Creatinine, Ser 0.91  0.50 - 1.35 mg/dL   Calcium 9.3  8.4 - 10.5 mg/dL   GFR calc non Af Amer >90  >90 mL/min   GFR calc Af Amer >90  >90 mL/min   Comment: (NOTE)     The eGFR has been calculated using the CKD EPI equation.     This calculation has not been validated in all clinical situations.     eGFR's persistently <90 mL/min signify possible Chronic Kidney     Disease.   Anion gap 13  5 - 15  TROPONIN I     Status: None   Collection Time    01/16/14  9:48 PM      Result Value Ref Range   Troponin I <0.30  <0.30 ng/mL   Comment:            Due to the release kinetics of cTnI,     a negative result within the first hours     of the onset of symptoms does not rule out      myocardial infarction with certainty.     If myocardial infarction is still suspected,     repeat the test at appropriate intervals.  LIPASE, BLOOD     Status: None   Collection Time    01/17/14 12:11 AM      Result Value Ref Range   Lipase 56  11 - 59 U/L  HEPATIC FUNCTION PANEL     Status: None   Collection Time    01/17/14 12:11 AM      Result Value Ref Range   Total Protein 6.6  6.0 - 8.3 g/dL   Albumin 3.8  3.5 - 5.2 g/dL   AST 37  0 - 37 U/L   ALT 24  0 - 53 U/L   Alkaline Phosphatase 78  39 - 117 U/L   Total Bilirubin 0.3  0.3 - 1.2 mg/dL   Bilirubin, Direct <0.2  0.0 - 0.3 mg/dL   Indirect Bilirubin NOT CALCULATED  0.3 - 0.9 mg/dL  TROPONIN I     Status: None   Collection Time    01/17/14 12:11 AM      Result Value Ref Range   Troponin I <0.30  <0.30 ng/mL   Comment:            Due to the release kinetics of cTnI,     a negative result within the first hours     of the onset of symptoms does not rule out     myocardial infarction with certainty.     If myocardial infarction is still suspected,     repeat the test at appropriate intervals.  HEMOGLOBIN A1C     Status: Abnormal   Collection Time    01/17/14 12:11 AM      Result Value Ref Range   Hemoglobin A1C 9.2 (*) <5.7 %   Comment: (NOTE)  According to the ADA Clinical Practice Recommendations for 2011, when     HbA1c is used as a screening test:      >=6.5%   Diagnostic of Diabetes Mellitus               (if abnormal result is confirmed)     5.7-6.4%   Increased risk of developing Diabetes Mellitus     References:Diagnosis and Classification of Diabetes Mellitus,Diabetes     NKNL,9767,34(LPFXT 1):S62-S69 and Standards of Medical Care in             Diabetes - 2011,Diabetes KWIO,9735,32 (Suppl 1):S11-S61.   Mean Plasma Glucose 217 (*) <117 mg/dL   Comment: Performed at [facility]  TROPONIN I     Status: None   Collection  Time    01/17/14  5:53 AM      Result Value Ref Range   Troponin I <0.30  <0.30 ng/mL   Comment:            Due to the release kinetics of cTnI,     a negative result within the first hours     of the onset of symptoms does not rule out     myocardial infarction with certainty.     If myocardial infarction is still suspected,     repeat the test at appropriate intervals.  TROPONIN I     Status: None   Collection Time    01/17/14 10:46 AM      Result Value Ref Range   Troponin I <0.30  <0.30 ng/mL   Comment:            Due to the release kinetics of cTnI,     a negative result within the first hours     of the onset of symptoms does not rule out     myocardial infarction with certainty.     If myocardial infarction is still suspected,     repeat the test at appropriate intervals.  GLUCOSE, CAPILLARY     Status: Abnormal   Collection Time    01/17/14 12:25 PM      Result Value Ref Range   Glucose-Capillary 222 (*) 70 - 99 mg/dL   Comment 1 Documented in Chart     Comment 2 Notify RN    GLUCOSE, CAPILLARY     Status: Abnormal   Collection Time    01/17/14  4:22 PM      Result Value Ref Range   Glucose-Capillary 163 (*) 70 - 99 mg/dL   Comment 1 Documented in Chart     Comment 2 Notify RN    TROPONIN I     Status: None   Collection Time    01/17/14  4:28 PM      Result Value Ref Range   Troponin I <0.30  <0.30 ng/mL   Comment:            Due to the release kinetics of cTnI,     a negative result within the first hours     of the onset of symptoms does not rule out     myocardial infarction with certainty.     If myocardial infarction is still suspected,     repeat the test at appropriate intervals.  GLUCOSE, CAPILLARY     Status: Abnormal   Collection Time    01/17/14  9:31 PM      Result Value Ref Range   Glucose-Capillary 240 (*) 70 - 99 mg/dL  CBC     Status: Abnormal   Collection Time    01/18/14  7:01 AM      Result Value Ref Range   WBC 8.0  4.0 - 10.5 K/uL    RBC 6.15 (*) 4.22 - 5.81 MIL/uL   Hemoglobin 19.4 (*) 13.0 - 17.0 g/dL   Comment: DELTA CHECK NOTED   HCT 53.4 (*) 39.0 - 52.0 %   MCV 86.8  78.0 - 100.0 fL   MCH 31.5  26.0 - 34.0 pg   MCHC 36.3 (*) 30.0 - 36.0 g/dL   RDW 12.4  11.5 - 15.5 %   Platelets 249  150 - 400 K/uL  BASIC METABOLIC PANEL     Status: Abnormal   Collection Time    01/18/14  7:01 AM      Result Value Ref Range   Sodium 137  137 - 147 mEq/L   Potassium 4.5  3.7 - 5.3 mEq/L   Chloride 100  96 - 112 mEq/L   CO2 25  19 - 32 mEq/L   Glucose, Bld 260 (*) 70 - 99 mg/dL   BUN 11  6 - 23 mg/dL   Creatinine, Ser 0.91  0.50 - 1.35 mg/dL   Calcium 9.3  8.4 - 10.5 mg/dL   GFR calc non Af Amer >90  >90 mL/min   GFR calc Af Amer >90  >90 mL/min   Comment: (NOTE)     The eGFR has been calculated using the CKD EPI equation.     This calculation has not been validated in all clinical situations.     eGFR's persistently <90 mL/min signify possible Chronic Kidney     Disease.   Anion gap 12  5 - 15  GLUCOSE, CAPILLARY     Status: Abnormal   Collection Time    01/18/14  8:56 AM      Result Value Ref Range   Glucose-Capillary 202 (*) 70 - 99 mg/dL   Comment 1 Notify RN     Comment 2 Documented in Chart      Dg Chest 2 View  01/16/2014   CLINICAL DATA:  Epigastric and central chest pain.  EXAM: CHEST  2 VIEW  COMPARISON:  11/25/2013  FINDINGS: The heart size and mediastinal contours are within normal limits. Both lungs are clear. The visualized skeletal structures are unremarkable.  IMPRESSION: No active cardiopulmonary disease.   Electronically Signed   By: Lucienne Capers M.D.   On: 01/16/2014 22:36   Nm Hepato W/eject Fract  01/17/2014   CLINICAL DATA:  Chest pain and right upper quadrant abdominal pain. Recent normal-appearing gallbladder by ultrasound.  EXAM: NUCLEAR MEDICINE HEPATOBILIARY IMAGING WITH GALLBLADDER EF  TECHNIQUE: Sequential images of the abdomen were obtained out to 60 minutes following intravenous  administration of radiopharmaceutical. After slow intravenous infusion of micrograms Cholecystokinin, gallbladder ejection fraction was determined.  RADIOPHARMACEUTICALS:  5 mCi Tc-28mCholetec  COMPARISON:  No prior nuclear imaging. Abdominal ultrasound 01/12/2014.  FINDINGS: Hepatic uptake a Choletec is normal. Intra and extrahepatic bile duct activity is visible within 10 min. Small bowel activity is visible within 15 min. Gallbladder activity is visible within 10 min, with normal filling of the gallbladder throughout the remainder of the imaging sequence. There is no evidence of radiopharmaceutical retention by the liver.  Gallbladder ejection fraction measured 27% at 30 min. (At 30 min, normal ejection fraction is greater than 30%).  The patient did not experience symptoms during CCK infusion.  IMPRESSION: 1. No evidence  of cystic duct or common bile duct obstruction. 2. Mild biliary dyskinesis, with slightly decreased gallbladder ejection fraction.   Electronically Signed   By: Evangeline Dakin M.D.   On: 01/17/2014 17:17    ROS: See chart Blood pressure 136/59, pulse 56, temperature 97.5 F (36.4 C), temperature source Oral, resp. rate 20, height 6' 4"  (1.93 m), weight 141.704 kg (312 lb 6.4 oz), SpO2 96.00%. Physical Exam: Pleasant white male no acute distress. Abdomen soft, nontender, nondistended. No hepatosplenomegaly, masses, or hernias identified.  Assessment/Plan: Impression: Recurrent chest pain, history of reflux disease. Patient's symptoms are atypical for biliary colic. I would be reluctant at this time to do a cholecystectomy given his symptoms and the lack of reproducibility with CCK injection. Discussed with GI. Would only do laparoscopic cholecystectomy as a last resort. I did explain this to the patient, who understands and agrees to the treatment plan. I would be more than happy to see him as an outpatient in the future should the need arise.   Arika Mainer A 01/18/2014, 9:10 AM

## 2014-01-18 NOTE — Discharge Summary (Signed)
Physician Discharge Summary  Jerome Shepherd:[id number] DOB: August 29, 1965 DOA: 01/16/2014  PCP: Glenda Chroman., MD  Admit date: 01/16/2014 Discharge date: 01/18/2014  Time spent: 40 minutes  Recommendations for Outpatient Follow-up:  1. Appointment with PCP 1 week for evaluation of symptoms and progress with managing Reflux. Recommend following TSH as currently high end of normal as well as DM management evaluation for optimal control 2. K. Lawrence NP cardiology 01/26/14. Evaluation for need to OP stress test. Also borderline bradycardia during night 3. Marinell Blight NP gastroenterology as needed.   Discharge Diagnoses:  Principal Problem:   Chest pain Active Problems:   Obesity   CAD (coronary artery disease), minimal, non obstructive 2013   GERD (gastroesophageal reflux disease)   Esophageal dysphagia, with solids only, leading to chest pain   HTN (hypertension)   Acute chest pain   Type 2 diabetes mellitus without complications   Antral erosion   Obesity, morbid   Discharge Condition: stable  Diet recommendation: low fat, no carbonated beverages  Filed Weights   01/16/14 2127 01/17/14 0338  Weight: 140.161 kg (309 lb) 141.704 kg (312 lb 6.4 oz)    History of present illness:  48 yo male h/o nonobs CAD per cath 2013, gerd, dm, htn, obesity presents to ED on 01/17/14 with recurrent chest pain/epigastric pain. Pt had been having this pain on/off for a year,  He reported that it comes on about an hour after eating (occurs with nonfatty foods and fatty foods) burning sensation in his chest mid that radiates down to his epigastrum. The pain usually relieved with getting up and walking around. The pain usually occurs when he goes to lie down, and lying down makes the pain worse. No n/v. No fevers. No sob. He had been seeing cardiology and gastroenterology as outpt, had abd u/s last week unrevealing. Told the next step would be a HIDA scan, if that is normal cards will do stress testing.    Hospital Course:  Chest pain/epigastic pain- Has been seen by both cardiology and GI team outpt and results favor symptoms related to reflux. Admitted to tele and ruled out. Troponin negative x3, no events on tele and EKG without evidence of ischemia. Evaluated by gastroenterology. Patient underwent EGD last week with dilation. [place] of abdomen and HIDA scan yield fatty liver with mildly reduced ejection fraction. General surgery evaluated and opined presentation atypical for biliary etiology. Recommending medical management. At discharge pain much improved. Patient educated on diet recommendations as well as medication administration. Tolerating diet without problem. Will follow up with PC as noted above. Given his several risk factors have scheduled follow up with cardiology for evaluation of need for stress test.  Active Problems:  GERD (gastroesophageal reflux disease)See above #1. PPI BID and carafate    Diabetes mellitus : A1c 9.2. Recommend close OP follow up for optimal control  Obesity :BMI 38.1. Educated to diet recommendations.   CAD (coronary artery disease), minimal, non obstructive 2013 : see #1. Follow up with cardiology 01/26/14  Esophageal dysphagia, with solids only, leading to chest pain: no difficulty with diet during this hospitalization  HTN (hypertension): fair control  Bradycardia: TSH 3.9 during night. EKG with SR. No further events     Consultations:  Gastroenterology  General surgery Dr Arnoldo Morale  Discharge Exam: Filed Vitals:   01/18/14 0648  BP: 136/59  Pulse: 56  Temp: 97.5 F (36.4 C)  Resp: 20    General: obese appears comfortable Cardiovascular: RRR No MGR No LE edema  Respiratory: normal effort BS clear bilaterally no wheeze no rhonch Abdomen: soft mild diffuse tenderness to palpation  Discharge Instructions You were cared for by a hospitalist during your hospital stay. If you have any questions about your discharge medications or the care you  received while you were in the hospital after you are discharged, you can call the unit and asked to speak with the hospitalist on call if the hospitalist that took care of you is not available. Once you are discharged, your primary care physician will handle any further medical issues. Please note that NO REFILLS for any discharge medications will be authorized once you are discharged, as it is imperative that you return to your primary care physician (or establish a relationship with a primary care physician if you do not have one) for your aftercare needs so that they can reassess your need for medications and monitor your lab values.      Discharge Instructions   Diet - low sodium heart healthy/carb modified   And low fat, no greasy foods. Avoid carbonated beverages Complete by:  As directed      Increase activity slowly    Complete by:  As directed             Medication List    STOP taking these medications       sucralfate 1 GM/10ML suspension  Commonly known as:  CARAFATE  Replaced by:  sucralfate 1 G tablet      TAKE these medications       amLODipine 10 MG tablet  Commonly known as:  NORVASC  Take 1 tablet (10 mg total) by mouth daily.     aspirin 81 MG chewable tablet  Chew 1 tablet (81 mg total) by mouth daily.     diphenhydrAMINE 50 MG capsule  Commonly known as:  BENADRYL  Take 50 mg by mouth at bedtime as needed for itching.     glyBURIDE micronized 6 MG tablet  Commonly known as:  GLYNASE  Take 6 mg by mouth daily with breakfast.     isosorbide mononitrate 30 MG 24 hr tablet  Commonly known as:  IMDUR  Take 30 mg by mouth daily.     Magnesium Gluconate 27.5 MG Tabs  Take 1 tablet by mouth as needed. cramps     metFORMIN 500 MG tablet  Commonly known as:  GLUCOPHAGE  Take 500 mg by mouth daily with breakfast.     pantoprazole 20 MG tablet  Commonly known as:  PROTONIX  Take 1 tablet (20 mg total) by mouth 2 (two) times daily.     simvastatin 40 MG  tablet  Commonly known as:  ZOCOR  Take 40 mg by mouth every evening.     sucralfate 1 G tablet  Commonly known as:  CARAFATE  Take 1 tablet (1 g total) by mouth 4 (four) times daily -  with meals and at bedtime.       Allergies  Allergen Reactions  . Codeine Nausea Only  . Sulfa Antibiotics Nausea Only   Follow-up Information   Follow up with VYAS,DHRUV B., MD. Schedule an appointment as soon as possible for a visit in 1 week. (for evaluation of symptoms)    Specialty:  Internal Medicine   Contact information:   [place] [address] [phone]       Follow up with Laban Emperor, NP. (as needed )    Specialty:  Gastroenterology   Contact information:   [phone]  [place] [address] [phone]       Follow up with Jory Sims, NP On 01/26/2014. (evaluation of need for stress test. appointment at 1:30)    Specialty:  Nurse Practitioner   Contact information:   [place] [address] [phone]        The results of significant diagnostics from this hospitalization (including imaging, microbiology, ancillary and laboratory) are listed below for reference.    Significant Diagnostic Studies: Dg Chest 2 View  01/16/2014   CLINICAL DATA:  Epigastric and central chest pain.  EXAM: CHEST  2 VIEW  COMPARISON:  11/25/2013  FINDINGS: The heart size and mediastinal contours are within normal limits. Both lungs are clear. The visualized skeletal structures are unremarkable.  IMPRESSION: No active cardiopulmonary disease.   Electronically Signed   By: Lucienne Capers M.D.   On: 01/16/2014 22:36   Nm Hepato W/eject Fract  01/17/2014   CLINICAL DATA:  Chest pain and right upper quadrant abdominal pain. Recent normal-appearing gallbladder by ultrasound.  EXAM: NUCLEAR MEDICINE HEPATOBILIARY IMAGING WITH GALLBLADDER EF  TECHNIQUE: Sequential images of the abdomen were obtained out to 60 minutes following intravenous administration of  radiopharmaceutical. After slow intravenous infusion of micrograms Cholecystokinin, gallbladder ejection fraction was determined.  RADIOPHARMACEUTICALS:  5 mCi Tc-98m Choletec  COMPARISON:  No prior nuclear imaging. Abdominal ultrasound 01/12/2014.  FINDINGS: Hepatic uptake a Choletec is normal. Intra and extrahepatic bile duct activity is visible within 10 min. Small bowel activity is visible within 15 min. Gallbladder activity is visible within 10 min, with normal filling of the gallbladder throughout the remainder of the imaging sequence. There is no evidence of radiopharmaceutical retention by the liver.  Gallbladder ejection fraction measured 27% at 30 min. (At 30 min, normal ejection fraction is greater than 30%).  The patient did not experience symptoms during CCK infusion.  IMPRESSION: 1. No evidence of cystic duct or common bile duct obstruction. 2. Mild biliary dyskinesis, with slightly decreased gallbladder ejection fraction.   Electronically Signed   By: Evangeline Dakin M.D.   On: 01/17/2014 17:17   US Abdomen Limited Ruq  01/12/2014   CLINICAL DATA:  One year history of right upper quadrant pain  EXAM: US ABDOMEN LIMITED - RIGHT UPPER QUADRANT  COMPARISON:  None.  FINDINGS: Gallbladder:  No gallstones or wall thickening visualized. No sonographic Murphy sign noted.  Common bile duct:  Diameter: Within normal limits at 4.8 mm.  Liver:  Enlarged, heterogeneous and markedly echogenic hepatic parenchyma. The liver measures approximately 20 cm in craniocaudal dimension. Evaluation is limited secondary to patient body habitus and the dense, echogenic nature of the hepatic parenchyma. The main portal vein is patent with antegrade flow. The adjacent kidney appears markedly hypoechoic in comparison to the liver consistent with advanced steatosis.  IMPRESSION: 1. Advanced hepatic steatosis. 2. No acute abnormality identified.   Electronically Signed   By: Jacqulynn Cadet M.D.   On: 01/12/2014 13:45     Microbiology: No results found for this or any previous visit (from the past 240 hour(s)).   Labs: Basic Metabolic Panel:  Recent Labs Lab 01/16/14 2148 01/18/14 0701  NA 138 137  K 4.0 4.5  CL 102 100  CO2 23 25  GLUCOSE 315* 260*  BUN 10 11  CREATININE 0.91 0.91  CALCIUM 9.3 9.3   Liver Function Tests:  Recent Labs Lab 01/17/14 0011  AST 37  ALT 24  ALKPHOS 78  BILITOT 0.3  PROT 6.6  ALBUMIN 3.8    Recent Labs Lab 01/17/14 0011  LIPASE 56   No results found for this basename: AMMONIA,  in the last 168 hours CBC:  Recent Labs Lab 01/16/14 2148 01/18/14 0701  WBC 7.4 8.0  HGB 16.1 19.4*  HCT 44.2 53.4*  MCV 87.0 86.8  PLT 215 249   Cardiac Enzymes:  Recent Labs Lab 01/16/14 2148 01/17/14 0011 01/17/14 0553 01/17/14 1046 01/17/14 1628  TROPONINI <0.30 <0.30 <0.30 <0.30 <0.30   BNP: BNP (last 3 results) No results found for this basename: PROBNP,  in the last 8760 hours CBG:  Recent Labs Lab 01/17/14 1225 01/17/14 1622 01/17/14 2131 01/18/14 0856 01/18/14 1126  GLUCAP 222* 163* 240* 202* 253*       Signed:  Marquette Blodgett M  [facility] 01/18/2014, 2:46 PM

## 2014-01-18 NOTE — Progress Notes (Signed)
Discharge instructions given on medications, and follow up visits,patient verbalized understanding. No c/o pain or discomfort noted. Accompanied by staff to an awaiting vehicle. 

## 2014-01-18 NOTE — Telephone Encounter (Signed)
Please arrange for elastography as an outpatient due to notable fatty liver on ultrasound.

## 2014-01-18 NOTE — Discharge Instructions (Signed)
If you were given medicines take as directed.  If you are on coumadin or contraceptives realize their levels and effectiveness is altered by many different medicines.  If you have any reaction (rash, tongues swelling, other) to the medicines stop taking and see a physician.   Discussed hida scan and stress test with your specialists. Please follow up as directed and return to the ER or see a physician for new or worsening symptoms.  Thank you. Filed Vitals:   01/16/14 2330 01/16/14 2345 01/17/14 0000 01/17/14 0016  BP: 147/75  130/78   Pulse: 59 60 66 60  Temp:   98 F (36.7 C)   TempSrc:   Oral   Resp: 19  15 15   Height:      Weight:      SpO2: 95%  90% 95%    Chest Pain (Nonspecific) It is often hard to give a specific diagnosis for the cause of chest pain. There is always a chance that your pain could be related to something serious, such as a heart attack or a blood clot in the lungs. You need to follow up with your health care provider for further evaluation. CAUSES   Heartburn.  Pneumonia or bronchitis.  Anxiety or stress.  Inflammation around your heart (pericarditis) or lung (pleuritis or pleurisy).  A blood clot in the lung.  A collapsed lung (pneumothorax). It can develop suddenly on its own (spontaneous pneumothorax) or from trauma to the chest.  Shingles infection (herpes zoster virus). The chest wall is composed of bones, muscles, and cartilage. Any of these can be the source of the pain.  The bones can be bruised by injury.  The muscles or cartilage can be strained by coughing or overwork.  The cartilage can be affected by inflammation and become sore (costochondritis). DIAGNOSIS  Lab tests or other studies may be needed to find the cause of your pain. Your health care provider may have you take a test called an ambulatory electrocardiogram (ECG). An ECG records your heartbeat patterns over a 24-hour period. You may also have other tests, such  as:  Transthoracic echocardiogram (TTE). During echocardiography, sound waves are used to evaluate how blood flows through your heart.  Transesophageal echocardiogram (TEE).  Cardiac monitoring. This allows your health care provider to monitor your heart rate and rhythm in real time.  Holter monitor. This is a portable device that records your heartbeat and can help diagnose heart arrhythmias. It allows your health care provider to track your heart activity for several days, if needed.  Stress tests by exercise or by giving medicine that makes the heart beat faster. TREATMENT   Treatment depends on what may be causing your chest pain. Treatment may include:  Acid blockers for heartburn.  Anti-inflammatory medicine.  Pain medicine for inflammatory conditions.  Antibiotics if an infection is present.  You may be advised to change lifestyle habits. This includes stopping smoking and avoiding alcohol, caffeine, and chocolate.  You may be advised to keep your head raised (elevated) when sleeping. This reduces the chance of acid going backward from your stomach into your esophagus. Most of the time, nonspecific chest pain will improve within 2-3 days with rest and mild pain medicine.  HOME CARE INSTRUCTIONS   If antibiotics were prescribed, take them as directed. Finish them even if you start to feel better.  For the next few days, avoid physical activities that bring on chest pain. Continue physical activities as directed.  Do not use any tobacco  products, including cigarettes, chewing tobacco, or electronic cigarettes.  Avoid drinking alcohol.  Only take medicine as directed by your health care provider.  Follow your health care provider's suggestions for further testing if your chest pain does not go away.  Keep any follow-up appointments you made. If you do not go to an appointment, you could develop lasting (chronic) problems with pain. If there is any problem keeping an  appointment, call to reschedule. SEEK MEDICAL CARE IF:   Your chest pain does not go away, even after treatment.  You have a rash with blisters on your chest.  You have a fever. SEEK IMMEDIATE MEDICAL CARE IF:   You have increased chest pain or pain that spreads to your arm, neck, jaw, back, or abdomen.  You have shortness of breath.  You have an increasing cough, or you cough up blood.  You have severe back or abdominal pain.  You feel nauseous or vomit.  You have severe weakness.  You faint.  You have chills. This is an emergency. Do not wait to see if the pain will go away. Get medical help at once. Call your local emergency services (911 in [place] Do not drive yourself to the hospital. MAKE SURE YOU:   Understand these instructions.  Will watch your condition.  Will get help right away if you are not doing well or get worse. Document Released: 03/20/2005 Document Revised: 06/15/2013 Document Reviewed: 01/14/2008 [facility] Patient Information 2015 [place], [place]. This information is not intended to replace advice given to you by your health care provider. Make sure you discuss any questions you have with your health care provider.  Gastroesophageal Reflux Disease, Adult Gastroesophageal reflux disease (GERD) happens when acid from your stomach flows up into the esophagus. When acid comes in contact with the esophagus, the acid causes soreness (inflammation) in the esophagus. Over time, GERD may create small holes (ulcers) in the lining of the esophagus. CAUSES   Increased body weight. This puts pressure on the stomach, making acid rise from the stomach into the esophagus.  Smoking. This increases acid production in the stomach.  Drinking alcohol. This causes decreased pressure in the lower esophageal sphincter (valve or ring of muscle between the esophagus and stomach), allowing acid from the stomach into the esophagus.  Late evening meals and a full stomach. This  increases pressure and acid production in the stomach.  A malformed lower esophageal sphincter. Sometimes, no cause is found. SYMPTOMS   Burning pain in the lower part of the mid-chest behind the breastbone and in the mid-stomach area. This may occur twice a week or more often.  Trouble swallowing.  Sore throat.  Dry cough.  Asthma-like symptoms including chest tightness, shortness of breath, or wheezing. DIAGNOSIS  Your caregiver may be able to diagnose GERD based on your symptoms. In some cases, X-rays and other tests may be done to check for complications or to check the condition of your stomach and esophagus. TREATMENT  Your caregiver may recommend over-the-counter or prescription medicines to help decrease acid production. Ask your caregiver before starting or adding any new medicines.  HOME CARE INSTRUCTIONS   Change the factors that you can control. Ask your caregiver for guidance concerning weight loss, quitting smoking, and alcohol consumption.  Avoid foods and drinks that make your symptoms worse, such as:  Caffeine or alcoholic drinks.  Chocolate.  Peppermint or mint flavorings.  Garlic and onions.  Spicy foods.  Citrus fruits, such as oranges, lemons, or limes.  Tomato-based  foods such as sauce, chili, salsa, and pizza.  Fried and fatty foods.  Avoid lying down for the 3 hours prior to your bedtime or prior to taking a nap.  Eat small, frequent meals instead of large meals.  Wear loose-fitting clothing. Do not wear anything tight around your waist that causes pressure on your stomach.  Raise the head of your bed 6 to 8 inches with wood blocks to help you sleep. Extra pillows will not help.  Only take over-the-counter or prescription medicines for pain, discomfort, or fever as directed by your caregiver.  Do not take aspirin, ibuprofen, or other nonsteroidal anti-inflammatory drugs (NSAIDs). SEEK IMMEDIATE MEDICAL CARE IF:   You have pain in your  arms, neck, jaw, teeth, or back.  Your pain increases or changes in intensity or duration.  You develop nausea, vomiting, or sweating (diaphoresis).  You develop shortness of breath, or you faint.  Your vomit is green, yellow, Jerome Shepherd, or looks like coffee grounds or blood.  Your stool is red, bloody, or Jerome Shepherd. These symptoms could be signs of other problems, such as heart disease, gastric bleeding, or esophageal bleeding. MAKE SURE YOU:   Understand these instructions.  Will watch your condition.  Will get help right away if you are not doing well or get worse. Document Released: 03/20/2005 Document Revised: 09/02/2011 Document Reviewed: 12/28/2010 [facility] Patient Information 2015 [place], [place]. This information is not intended to replace advice given to you by your health care provider. Make sure you discuss any questions you have with your health care provider.

## 2014-01-18 NOTE — Progress Notes (Signed)
Inpatient Diabetes Program Recommendations  AACE/ADA: New Consensus Statement on Inpatient Glycemic Control (2013)  Target Ranges:  Prepandial:   less than 140 mg/dL      Peak postprandial:   less than 180 mg/dL (1-2 hours)      Critically ill patients:  140 - 180 mg/dL   Results for Jerome Shepherd, Jerome Shepherd (MRN [id number]) as of 01/18/2014 08:40  Ref. Range 01/17/2014 12:25 01/17/2014 16:22 01/17/2014 21:31  Glucose-Capillary Latest Range: 70-99 mg/dL 222 (H) 163 (H) 240 (H)   Results for Jerome Shepherd, Jerome Shepherd (MRN [id number]) as of 01/18/2014 08:40  Ref. Range 01/16/2014 21:48 01/17/2014 00:11 01/18/2014 07:01  Hemoglobin A1C Latest Range: <5.7 %  9.2 (H)   Glucose Latest Range: 70-99 mg/dL 315 (H)  260 (H)   Diabetes history: DM2 Outpatient Diabetes medications: Glynase 6 mg daily with breakfast, Metformin 500 mg QAM Current orders for Inpatient glycemic control: Novolog 0-15 units AC, Novolog 0-5 units HS, Metformin 500 mg BID  Inpatient Diabetes Program Recommendations Insulin - Basal: Please consider ordering low dose basal insulin; recommend starting with Levemir 10 units daily.  Thanks, Barnie Alderman, RN, MSN, CCRN Diabetes Coordinator Inpatient Diabetes Program [phone] (Team Pager) [phone] ([facility] office) [phone] [facility] office)

## 2014-01-18 NOTE — Consult Note (Signed)
Referring Provider: Dr. Arnoldo Morale Primary Care Physician:  Glenda Chroman., MD Primary Gastroenterologist:  Dr. Gala Romney   Date of Admission: 01/16/14 Date of Consultation: 01/18/14  Reason for Consultation:  Epigastric pain  HPI:  Jerome Shepherd is a pleasant 48 year old male who was seen as an outpatient on 7/16 by myself secondary to severe reflux symptoms and dysphagia. He underwent an EGD on 7/20 and found to have a completely normal esophagus, antral erosions with path noting inactive gastritis. Empiric dilation performed.  Admitted Sunday. Notes severe pain from mid-sternum to epigastric region.  Off and on for about a year. Wants relief. Had eaten 2 hamburger patties with A1 sauce, a few fries, little debbie snack cake prior to episode. Yesterday evening had small amount of pressure in upper abdomen but severe pain never kicked in. No N/V. Gets mildly light-headed with symptoms. Feels like he got very constipated with Dexilant. Now on Protonix BID while inpatient. States he feels like when he gets up and walks, symptoms improve. If laying down, it is worse. Feels like everything goes down to upper abdomen and then just "stops".   [place] of abdomen with fatty liver. HIDA with EF of 27% but no reproduction of symptoms with CCK.     Past Medical History  Diagnosis Date  . Diabetes mellitus   . Sleep apnea   . Coronary artery spasm 2013  . CAD (coronary artery disease), minimal, non obstructive 2013 01/01/2013  . Hypertension   . Constipation   . Myocardial infarction   . Antral erosion 01/17/2014    Per EGD 01/10/14- not likely the cause of his symtoms     Past Surgical History  Procedure Laterality Date  . No past surgeries    . Cardiac catheterization  2013    non obstructive CAD  ~20%  . Esophagogastroduodenoscopy N/A 01/10/2014    Dr. Gala Romney: antral erosions, path with chronic inactive gastritis. Empiric dilation with 56-F dilation  . Savory dilation N/A 01/10/2014    Procedure: SAVORY  DILATION;  Surgeon: Daneil Dolin, MD;  Location: [facility];  Service: Endoscopy;  Laterality: N/A;  Venia Minks dilation N/A 01/10/2014    Procedure: Venia Minks DILATION;  Surgeon: Daneil Dolin, MD;  Location: [facility];  Service: Endoscopy;  Laterality: N/A;    Prior to Admission medications   Medication Sig Start Date End Date Taking? Authorizing Provider  amLODipine (NORVASC) 10 MG tablet Take 1 tablet (10 mg total) by mouth daily. 08/23/13  Yes Herminio Commons, MD  aspirin 81 MG chewable tablet Chew 1 tablet (81 mg total) by mouth daily. 01/01/13  Yes Varney Biles, MD  diphenhydrAMINE (BENADRYL) 50 MG capsule Take 50 mg by mouth at bedtime as needed for itching.   Yes Historical Provider, MD  glyBURIDE micronized (GLYNASE) 6 MG tablet Take 6 mg by mouth daily with breakfast.    Yes Historical Provider, MD  isosorbide mononitrate (IMDUR) 30 MG 24 hr tablet Take 30 mg by mouth daily.   Yes Historical Provider, MD  Magnesium Gluconate 27.5 MG TABS Take 1 tablet by mouth as needed. cramps   Yes Historical Provider, MD  metFORMIN (GLUCOPHAGE) 500 MG tablet Take 500 mg by mouth daily with breakfast.    Yes Historical Provider, MD  pantoprazole (PROTONIX) 20 MG tablet Take 1 tablet (20 mg total) by mouth 2 (two) times daily. 11/25/13  Yes Tanna Furry, MD  simvastatin (ZOCOR) 40 MG tablet Take 40 mg by mouth every evening.   Yes  Historical Provider, MD  sucralfate (CARAFATE) 1 GM/10ML suspension Take 10 mLs (1 g total) by mouth 4 (four) times daily. 01/06/14  Yes Orvil Feil, NP    Current Facility-Administered Medications  Medication Dose Route Frequency Provider Last Rate Last Dose  . acetaminophen (TYLENOL) tablet 650 mg  650 mg Oral Q4H PRN Phillips Grout, MD      . amLODipine (NORVASC) tablet 10 mg  10 mg Oral Daily Phillips Grout, MD      . aspirin chewable tablet 81 mg  81 mg Oral Daily Rachal A Shanon Brow, MD      . diphenhydrAMINE (BENADRYL) capsule 50 mg  50 mg Oral QHS PRN Phillips Grout, MD   50 mg at 01/17/14 2232  . gi cocktail (Maalox,Lidocaine,Donnatal)  30 mL Oral QID PRN Phillips Grout, MD      . insulin aspart (novoLOG) injection 0-15 Units  0-15 Units Subcutaneous TID WC Rexene Alberts, MD   3 Units at 01/17/14 1732  . insulin aspart (novoLOG) injection 0-5 Units  0-5 Units Subcutaneous QHS Rexene Alberts, MD   2 Units at 01/17/14 2236  . isosorbide mononitrate (IMDUR) 24 hr tablet 30 mg  30 mg Oral Daily Phillips Grout, MD      . metFORMIN (GLUCOPHAGE) tablet 500 mg  500 mg Oral BID WC Rexene Alberts, MD   500 mg at 01/17/14 1700  . ondansetron (ZOFRAN) injection 4 mg  4 mg Intravenous Q6H PRN Phillips Grout, MD      . pantoprazole (PROTONIX) EC tablet 40 mg  40 mg Oral BID Rexene Alberts, MD   40 mg at 01/17/14 2233  . simvastatin (ZOCOR) tablet 40 mg  40 mg Oral QPM Phillips Grout, MD   40 mg at 01/17/14 1733  . sucralfate (CARAFATE) 1 GM/10ML suspension 1 g  1 g Oral TID AC & HS Phillips Grout, MD   1 g at 01/17/14 2233    Allergies as of 01/16/2014 - Review Complete 01/16/2014  Allergen Reaction Noted  . Codeine Nausea Only 08/18/2011  . Sulfa antibiotics Nausea Only 08/18/2011    Family History  Problem Relation Age of Onset  . Diabetes type II Sister   . Heart attack Cousin   . Colon cancer Neg Hx   . Stomach cancer Neg Hx     History   Social History  . Marital Status: Divorced    Spouse Name: N/A    Number of Children: N/A  . Years of Education: N/A   Occupational History  . truck driver    Social History Main Topics  . Smoking status: Former Smoker    Quit date: 08/18/1995  . Smokeless tobacco: Not on file     Comment: Quit smoking x 20 years  . Alcohol Use: No  . Drug Use: No  . Sexual Activity: Not Currently   Other Topics Concern  . Not on file   Social History Narrative   Divorced, no children, works as a Production designer, theatre/television/film.    Review of Systems: As mentioned in HPI.   Physical Exam: Vital signs in last 24  hours: Temp:  [97.5 F (36.4 C)-98.3 F (36.8 C)] 97.5 F (36.4 C) (07/28 0648) Pulse Rate:  [51-57] 56 (07/28 0648) Resp:  [16-20] 20 (07/28 0648) BP: (132-156)/(58-83) 136/59 mmHg (07/28 0648) SpO2:  [96 %-99 %] 96 % (07/28 0648) Last BM Date: 01/16/14 General:   Alert,  Well-developed, well-nourished, pleasant and cooperative in  NAD Head:  Normocephalic and atraumatic. Eyes:  Sclera clear, no icterus.   Conjunctiva pink. Ears:  Normal auditory acuity. Nose:  No deformity, discharge,  or lesions. Mouth:  No deformity or lesions, dentition normal. Lungs:  Clear throughout to auscultation.   No wheezes, crackles, or rhonchi. No acute distress. Heart:  S1 S2 present with no murmurs appreciated Abdomen:  Soft, obese, nontender and nondistended. No masses, hepatosplenomegaly or hernias noted. Normal bowel sounds, without guarding, and without rebound.   Rectal:  Deferred  Msk:  Symmetrical without gross deformities. Normal posture. Extremities:  Without clubbing or edema. Neurologic:  Alert and  oriented x4;  grossly normal neurologically. Skin:  Intact without significant lesions or rashes. Psych:  Alert and cooperative. Normal mood and affect.  Intake/Output from previous day: 07/27 0701 - 07/28 0700 In: 480 [P.O.:480] Out: 1400 [Urine:1400] Intake/Output this shift:    Lab Results:  Recent Labs  01/16/14 2148 01/18/14 0701  WBC 7.4 8.0  HGB 16.1 19.4*  HCT 44.2 53.4*  PLT 215 249   BMET  Recent Labs  01/16/14 2148 01/18/14 0701  NA 138 137  K 4.0 4.5  CL 102 100  CO2 23 25  GLUCOSE 315* 260*  BUN 10 11  CREATININE 0.91 0.91  CALCIUM 9.3 9.3   LFT  Recent Labs  01/17/14 0011  PROT 6.6  ALBUMIN 3.8  AST 37  ALT 24  ALKPHOS 78  BILITOT 0.3  BILIDIR <0.2  IBILI NOT CALCULATED    Studies/Results: Dg Chest 2 View  01/16/2014   CLINICAL DATA:  Epigastric and central chest pain.  EXAM: CHEST  2 VIEW  COMPARISON:  11/25/2013  FINDINGS: The heart size  and mediastinal contours are within normal limits. Both lungs are clear. The visualized skeletal structures are unremarkable.  IMPRESSION: No active cardiopulmonary disease.   Electronically Signed   By: Lucienne Capers M.D.   On: 01/16/2014 22:36   Nm Hepato W/eject Fract  01/17/2014   CLINICAL DATA:  Chest pain and right upper quadrant abdominal pain. Recent normal-appearing gallbladder by ultrasound.  EXAM: NUCLEAR MEDICINE HEPATOBILIARY IMAGING WITH GALLBLADDER EF  TECHNIQUE: Sequential images of the abdomen were obtained out to 60 minutes following intravenous administration of radiopharmaceutical. After slow intravenous infusion of micrograms Cholecystokinin, gallbladder ejection fraction was determined.  RADIOPHARMACEUTICALS:  5 mCi Tc-66m Choletec  COMPARISON:  No prior nuclear imaging. Abdominal ultrasound 01/12/2014.  FINDINGS: Hepatic uptake a Choletec is normal. Intra and extrahepatic bile duct activity is visible within 10 min. Small bowel activity is visible within 15 min. Gallbladder activity is visible within 10 min, with normal filling of the gallbladder throughout the remainder of the imaging sequence. There is no evidence of radiopharmaceutical retention by the liver.  Gallbladder ejection fraction measured 27% at 30 min. (At 30 min, normal ejection fraction is greater than 30%).  The patient did not experience symptoms during CCK infusion.  IMPRESSION: 1. No evidence of cystic duct or common bile duct obstruction. 2. Mild biliary dyskinesis, with slightly decreased gallbladder ejection fraction.   Electronically Signed   By: Evangeline Dakin M.D.   On: 01/17/2014 17:17    Impression/Plan: 48 year old male with intermittent retrosternal discomfort radiating to upper abdomen, with overall negative EGD on file. Biliary etiology considered, with [place] of abdomen and subsequent HIDA performed. Fatty liver noted with just mildly reduced ejection fraction on HIDA; he had no reproduction of symptoms  with CCK infusion. Dietary intake may be a large contributor, as he eats  significant amounts of fatty foods, carbonated drinks; I have already reviewed with him appropriate dietary choices during the outpatient consultation and again this admission. Cholecystectomy is considered per General Surgery if medical therapy has been maximized for patient first. Would recommend a low-fat diet, behavior modification, avoidance of carbonated beverages, Protonix BID, carafate suspension, and further evaluation as an outpatient. If persistent symptoms, could contemplate elective cholecystectomy as outpatient.  Orvil Feil, ANP-BC [facility]      LOS: 2 days    01/18/2014, 8:59 AM  Attending note:  Patient seen and examined.  Symptoms are atypical for GERD alone.  A coexisting esophageal motility disorder remains in the differential as well.  Symptoms not likely biliary in origin. Agree with empiric twice a day PPI therapy along with Carafate. Outpatient liver evaluation as being orchestrated with elastography. We'll arrange to see back in the  office in the coming weeks.

## 2014-01-19 ENCOUNTER — Other Ambulatory Visit: Payer: Self-pay | Admitting: Gastroenterology

## 2014-01-19 DIAGNOSIS — K760 Fatty (change of) liver, not elsewhere classified: Secondary | ICD-10-CM

## 2014-01-19 NOTE — Telephone Encounter (Signed)
Pt is aware of his U/S results.

## 2014-01-19 NOTE — Telephone Encounter (Signed)
U/S Is scheduled for Monday Aug 3rd at 9:00 am and Jerome Shepherd is aware

## 2014-01-19 NOTE — Telephone Encounter (Signed)
Pt was admitted and is aware of his U/S results.

## 2014-01-24 ENCOUNTER — Other Ambulatory Visit (HOSPITAL_COMMUNITY): Payer: No Typology Code available for payment source

## 2014-01-24 ENCOUNTER — Ambulatory Visit (HOSPITAL_COMMUNITY)
Admission: RE | Admit: 2014-01-24 | Discharge: 2014-01-24 | Disposition: A | Discharge status: Home / Self Care | Payer: No Typology Code available for payment source | Source: Ambulatory Visit | Attending: Gastroenterology | Admitting: Gastroenterology

## 2014-01-24 DIAGNOSIS — K760 Fatty (change of) liver, not elsewhere classified: Secondary | ICD-10-CM

## 2014-01-24 DIAGNOSIS — K7689 Other specified diseases of liver: Secondary | ICD-10-CM | POA: Insufficient documentation

## 2014-01-25 NOTE — Progress Notes (Signed)
Quick Note:  Metavir score of F4, indicating cirrhosis.  Needs Hep A and B vaccinations if he has not ever had. Offer routine follow-up with [place] to discuss cirrhosis care. Likely secondary to NASH. ______

## 2014-01-26 ENCOUNTER — Ambulatory Visit (INDEPENDENT_AMBULATORY_CARE_PROVIDER_SITE_OTHER): Payer: PRIVATE HEALTH INSURANCE | Admitting: Adult Health

## 2014-01-26 ENCOUNTER — Encounter: Payer: No Typology Code available for payment source | Admitting: Adult Health

## 2014-01-26 ENCOUNTER — Encounter: Payer: Self-pay | Admitting: Adult Health

## 2014-01-26 VITALS — BP 148/82 | HR 84 | Ht 76.0 in | Wt 311.0 lb

## 2014-01-26 DIAGNOSIS — Z6837 Body mass index (BMI) 37.0-37.9, adult: Secondary | ICD-10-CM

## 2014-01-26 DIAGNOSIS — E669 Obesity, unspecified: Secondary | ICD-10-CM

## 2014-01-26 DIAGNOSIS — I10 Essential (primary) hypertension: Secondary | ICD-10-CM

## 2014-01-26 DIAGNOSIS — I2510 Atherosclerotic heart disease of native coronary artery without angina pectoris: Secondary | ICD-10-CM

## 2014-01-26 MED ORDER — NITROGLYCERIN 0.4 MG SL SUBL: 0.4000 mg | SUBLINGUAL_TABLET | SUBLINGUAL | Status: DC | PRN

## 2014-01-26 MED ORDER — ISOSORBIDE MONONITRATE ER 60 MG PO TB24: 60.0000 mg | ORAL_TABLET | Freq: Every day | ORAL | Status: DC

## 2014-01-26 NOTE — Patient Instructions (Signed)
Your physician recommends that you schedule a follow-up appointment in: 6 months with Dr Virgina Jock will receive a reminder letter two months in advance reminding you to call and schedule your appointment. If you don't receive this letter, please contact our office.  Your physician has recommended you make the following change in your medication:  Increased Isosorbide to 60 mg daily

## 2014-01-26 NOTE — Assessment & Plan Note (Addendum)
He is given reassurance along with answers to multiple questions. I have advised him to decrease or eliminate caffeine intake to assist with spasms of the esophagus. I have increased the dose of imdur to 60 mg. I have given new Rx for NTG SL as he has not had it refilled.  He will be seen in 6 months unless symptomatic. Can consider stress test if symptoms persist on next visit.

## 2014-01-26 NOTE — Progress Notes (Deleted)
Name: Jerome Shepherd    DOB: May 22, 1966  Age: 48 y.o.  MR#: [id number]       PCP:  Glenda Chroman., MD      Insurance: Payor: [employer] MULT[employer]LAN / Plan: MULTIPLAN [employer] / Product Type: *No Product type* /   CC:   No chief complaint on file.   VS Filed Vitals:   01/26/14 1500  BP: 148/82  Pulse: 84  Height: 6\' 4"  (1.93 m)  Weight: 311 lb (141.069 kg)  SpO2: 95%    Weights Current Weight  01/26/14 311 lb (141.069 kg)  01/17/14 312 lb 6.4 oz (141.704 kg)  01/06/14 310 lb 3.2 oz (140.706 kg)    Blood Pressure  BP Readings from Last 3 Encounters:  01/26/14 148/82  01/18/14 136/59  01/10/14 121/55     Admit date:  (Not on file) Last encounter with RMR:  Visit date not found   Allergy Codeine and Sulfa antibiotics  Current Outpatient Prescriptions  Medication Sig Dispense Refill  . amLODipine (NORVASC) 10 MG tablet Take 1 tablet (10 mg total) by mouth daily.  30 tablet  6  . aspirin 81 MG chewable tablet Chew 1 tablet (81 mg total) by mouth daily.  30 tablet  0  . diphenhydrAMINE (BENADRYL) 50 MG capsule Take 50 mg by mouth at bedtime as needed for itching.      . glyBURIDE micronized (GLYNASE) 6 MG tablet Take 6 mg by mouth daily with breakfast.       . isosorbide mononitrate (IMDUR) 30 MG 24 hr tablet Take 30 mg by mouth daily.      . Magnesium Gluconate 27.5 MG TABS Take 1 tablet by mouth as needed. cramps      . metFORMIN (GLUCOPHAGE) 500 MG tablet Take 500 mg by mouth daily with breakfast.       . pantoprazole (PROTONIX) 40 MG tablet Take 1 tablet (40 mg total) by mouth 2 (two) times daily.  60 tablet  0  . simvastatin (ZOCOR) 40 MG tablet Take 40 mg by mouth every evening.      . sucralfate (CARAFATE) 1 G tablet Take 1 tablet (1 g total) by mouth 4 (four) times daily -  with meals and at bedtime.  42 tablet  0   No current facility-administered medications for this visit.    Discontinued Meds:   There are no discontinued medications.  Patient Active Problem List    Diagnosis Date Noted  . Acute chest pain 01/17/2014  . Type 2 diabetes mellitus without complications 78/29/5621  . 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 Obesity, morbid 01/17/2014  . Dysphagia, unspecified(787.20) 01/06/2014  . Esophageal dysphagia, with solids only, leading to chest pain 01/22/2013  . HTN (hypertension) 01/22/2013  . 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 Chest pain 01/01/2013  . CAD (coronary artery disease), minimal, non obstructive 2013 01/01/2013  . GERD (gastroesophageal reflux disease) 01/01/2013  . Abnormal EKG, ?st elevation reported at OSH 08/18/2011  . Diabetes mellitus 08/18/2011  . Obesity 08/18/2011    LABS    Component Value Date/Time   NA 137 01/18/2014 0701   NA 138 01/16/2014 2148   NA 140 11/25/2013 1255   K 4.5 01/18/2014 0701   K 4.0 01/16/2014 2148   K 4.1 11/25/2013 1255   CL 100 01/18/2014 0701   CL 102 01/16/2014 2148   CL 99 11/25/2013 1255   CO2 25 01/18/2014 0701   CO2 23 01/16/2014 2148   CO2 26 11/25/2013 1255   GLUCOSE  260* 01/18/2014 0701   GLUCOSE 315* 01/16/2014 2148   GLUCOSE 207* 11/25/2013 1255   BUN 11 01/18/2014 0701   BUN 10 01/16/2014 2148   BUN 15 11/25/2013 1255   CREATININE 0.91 01/18/2014 0701   CREATININE 0.91 01/16/2014 2148   CREATININE 0.91 11/25/2013 1255   CALCIUM 9.3 01/18/2014 0701   CALCIUM 9.3 01/16/2014 2148   CALCIUM 9.4 11/25/2013 1255   GFRNONAA >90 01/18/2014 0701   GFRNONAA >90 01/16/2014 2148   GFRNONAA >90 11/25/2013 1255   GFRAA >90 01/18/2014 0701   GFRAA >90 01/16/2014 2148   GFRAA >90 11/25/2013 1255   CMP     Component Value Date/Time   NA 137 01/18/2014 0701   K 4.5 01/18/2014 0701   CL 100 01/18/2014 0701   CO2 25 01/18/2014 0701   GLUCOSE 260* 01/18/2014 0701   BUN 11 01/18/2014 0701   CREATININE 0.91 01/18/2014 0701   CALCIUM 9.3 01/18/2014 0701   PROT 6.6 01/17/2014 0011   ALBUMIN 3.8 01/17/2014 0011   AST 37 01/17/2014 0011   ALT 24 01/17/2014 0011   ALKPHOS 78 01/17/2014 0011   BILITOT 0.3 01/17/2014 0011   GFRNONAA >90  01/18/2014 0701   GFRAA >90 01/18/2014 0701       Component Value Date/Time   WBC 8.0 01/18/2014 0701   WBC 7.4 01/16/2014 2148   WBC 9.6 11/25/2013 1255   HGB 19.4* 01/18/2014 0701   HGB 16.1 01/16/2014 2148   HGB 16.4 11/25/2013 1255   HCT 53.4* 01/18/2014 0701   HCT 44.2 01/16/2014 2148   HCT 46.0 11/25/2013 1255   MCV 86.8 01/18/2014 0701   MCV 87.0 01/16/2014 2148   MCV 87.6 11/25/2013 1255    Lipid Panel     Component Value Date/Time   CHOL 150 08/19/2011 0500   TRIG 177* 08/19/2011 0500   HDL 32* 08/19/2011 0500   CHOLHDL 4.7 08/19/2011 0500   VLDL 35 08/19/2011 0500   LDLCALC 83 08/19/2011 0500    ABG    Component Value Date/Time   TCO2 23 08/18/2011 0806     Lab Results  Component Value Date   TSH 3.910 01/16/2014   BNP (last 3 results) No results found for this basename: PROBNP,  in the last 8760 hours Cardiac Panel (last 3 results) No results found for this basename: CKTOTAL, CKMB, TROPONINI, RELINDX,  in the last 72 hours  Iron/TIBC/Ferritin/ %Sat No results found for this basename: iron, tibc, ferritin, ironpctsat     EKG Orders placed during the hospital encounter of 01/16/14  . EKG 12-LEAD  . EKG 12-LEAD  . EKG 12-LEAD  . EKG 12-LEAD  . EKG     Prior Assessment and Plan Problem List as of 01/26/2014     Cardiovascular and Mediastinum   CAD (coronary artery disease), minimal, non obstructive 2013   Last Assessment & Plan   01/22/2013 Office Visit Written 01/22/2013  5:52 PM by Cecilie Kicks, NP     History of mild increase in troponin with minimal coronary artery disease.  Thought perhaps there was coronary spasm at the time of the cath. Recent emergency room visit for pain most likely related to GI issues.    HTN (hypertension)   Last Assessment & Plan   01/22/2013 Office Visit Written 01/22/2013  5:54 PM by Cecilie Kicks, NP     Blood pressure elevated today for diabetic patient, he has not had lisinopril and overweight secondary to sexual dysfunction.  I have stopped  his  lisinopril and changed him to amlodipine 5 mg daily.  This could control blood pressure as well as prevent coronary spasm.      Digestive   Esophageal dysphagia, with solids only, leading to chest pain   Last Assessment & Plan   01/22/2013 Office Visit Written 01/22/2013  5:51 PM by Cecilie Kicks, NP     We will arrange for the patient to see gastroenterologist. I have asked him to take Prilosec 40 mg daily and to stop Tagamet. We also discussed eating 3 smaller meals daily instead of 2 large meals. Also asked to decrease caffeine, he had already stopped soft drinks.    GERD (gastroesophageal reflux disease)   Last Assessment & Plan   01/06/2014 Office Visit Edited 01/09/2014 10:37 PM by Orvil Feil, NP     48 year old male with worsening GERD, significant retrosternal discomfort, epigastric discomfort, and nausea without improvement despite PPI BID. Likely multifactorial secondary to dietary and behavior, as well as recent prior NSAID usage. Discussed avoidance of carbonated drinks, fatty foods, and aggressive weight loss. Gallbladder remains in situ but less likely biliary etiology. Intermittent dysphagia also noted likely secondary to esophagitis, possible web, ring, or stricture. Needs further evaluation with EGD and dilation as appropriate in near future with Dr. Gala Romney. Risks and benefits discussed in detail with stated understanding. Trial of Dexilant, with samples provided. Short course of Carafate provided.     Dysphagia, unspecified(787.20)   Last Assessment & Plan   01/06/2014 Office Visit Written 01/09/2014 10:35 PM by Orvil Feil, NP     Dilation as appropriate.     Antral erosion     Endocrine   Diabetes mellitus   Last Assessment & Plan   01/22/2013 Office Visit Written 01/22/2013  5:54 PM by Cecilie Kicks, NP     Stable    Type 2 diabetes mellitus without complications     Genitourinary   Male sexual dysfunction   Last Assessment & Plan   01/22/2013 Office Visit Written 01/22/2013   5:55 PM by Cecilie Kicks, NP     He is on AndroGel currently, he uses every other day. I instructed him to need to get refills from his primary care physician      Other   Obesity   Abnormal EKG, ?st elevation reported at OSH   Chest pain   Acute chest pain   Obesity, morbid       Imaging: Dg Chest 2 View  01/16/2014   CLINICAL DATA:  Epigastric and central chest pain.  EXAM: CHEST  2 VIEW  COMPARISON:  11/25/2013  FINDINGS: The heart size and mediastinal contours are within normal limits. Both lungs are clear. The visualized skeletal structures are unremarkable.  IMPRESSION: No active cardiopulmonary disease.   Electronically Signed   By: Lucienne Capers M.D.   On: 01/16/2014 22:36   Nm Hepato W/eject Fract  01/17/2014   CLINICAL DATA:  Chest pain and right upper quadrant abdominal pain. Recent normal-appearing gallbladder by ultrasound.  EXAM: NUCLEAR MEDICINE HEPATOBILIARY IMAGING WITH GALLBLADDER EF  TECHNIQUE: Sequential images of the abdomen were obtained out to 60 minutes following intravenous administration of radiopharmaceutical. After slow intravenous infusion of micrograms Cholecystokinin, gallbladder ejection fraction was determined.  RADIOPHARMACEUTICALS:  5 mCi Tc-30m Choletec  COMPARISON:  No prior nuclear imaging. Abdominal ultrasound 01/12/2014.  FINDINGS: Hepatic uptake a Choletec is normal. Intra and extrahepatic bile duct activity is visible within 10 min. Small bowel activity is visible within 15 min. Gallbladder activity is visible  within 10 min, with normal filling of the gallbladder throughout the remainder of the imaging sequence. There is no evidence of radiopharmaceutical retention by the liver.  Gallbladder ejection fraction measured 27% at 30 min. (At 30 min, normal ejection fraction is greater than 30%).  The patient did not experience symptoms during CCK infusion.  IMPRESSION: 1. No evidence of cystic duct or common bile duct obstruction. 2. Mild biliary dyskinesis,  with slightly decreased gallbladder ejection fraction.   Electronically Signed   By: Evangeline Dakin M.D.   On: 01/17/2014 17:17   US Abdomen Complete W/elastography  01/24/2014   CLINICAL DATA:  Fatty liver  EXAM: ULTRASOUND ABDOMEN  ULTRASOUND HEPATIC ELASTOGRAPHY  TECHNIQUE: Sonography of the upper abdomen was perform, in addition, ultrasound elastography evaluation of the liver was performed. A region of interest was placed within the right lobe of the liver. Following application of a compressive sonographic pulse, shear waves were detected in the adjacent hepatic tissue and the shear wave velocity was calculated. Multiple assessments were performed at the selected site. Median shear wave velocity is correlated to a Metavir fibrosis score.  COMPARISON:  Right upper quadrant ultrasound dated 01/12/2014  CORRELATIVE IMAGING: None  FINDINGS: ULTRASOUND ABDOMEN  Gallbladder:  No gallstones, gallbladder wall thickening, or pericholecystic fluid. Negative sonographic Murphy's sign.  Common bile duct:  Diameter: 5 mm.  Liver:  Enlarged, measuring 20.7 cm. Mildly nodular hepatic contour with coarse echogenicity at hyperechoic hepatic parenchyma, raising the possibility of early cirrhosis and/or hepatic steatosis. No focal hepatic lesion is seen.  IVC:  No abnormality visualized.  Pancreas:  Visualized portion unremarkable.  Spleen:  Mildly enlarged, measuring 13.1 x 13.9 x 5.4 cm (calculated volume 509 mL).  Right Kidney:  Length: 15.4 cm.  No mass or hydronephrosis.  Left Kidney:  Length: 12.8 cm.  No mass or hydronephrosis.  Abdominal aorta:  No aneurysm visualized.  Other findings:  None.  ULTRASOUND HEPATIC ELASTOGRAPHY  Hepatic Segment:  8  Number of measurements:  11  Median velocity:   3.22  m/sec  Corresponding Metavir fibrosis score:  F4  Pertinent liver disease findings noted on other imaging exams: None noted or none available  Please note that abnormal shear wave velocities may also be identified in  clinical settings other than with hepatic fibrosis, such as: Acute hepatitis, elevated right heart and central venous pressures, veno-occlusive disease (Budd-Chiari), infiltrative processes such as mastocytosis/amyloidosis/infiltrative tumor, extrahepatic cholestasis, in the post-prandial state, and liver transplantation. Correlation with patient history, laboratory data, and clinical condition recommended.  IMPRESSION: ULTRASOUND ABDOMEN: Mild hepatosplenomegaly with early cirrhosis and/or hepatic steatosis.  ULTRASOUND HEPATIC ELASTOGRAPHY: Median hepatic shear wave velocity is calculated at 3.22 m/sec, which corresponds to a Metavir fibrosis score of F4.   Electronically Signed   By: Julian Hy M.D.   On: 01/24/2014 17:37   US Abdomen Limited Ruq  01/12/2014   CLINICAL DATA:  One year history of right upper quadrant pain  EXAM: US ABDOMEN LIMITED - RIGHT UPPER QUADRANT  COMPARISON:  None.  FINDINGS: Gallbladder:  No gallstones or wall thickening visualized. No sonographic Murphy sign noted.  Common bile duct:  Diameter: Within normal limits at 4.8 mm.  Liver:  Enlarged, heterogeneous and markedly echogenic hepatic parenchyma. The liver measures approximately 20 cm in craniocaudal dimension. Evaluation is limited secondary to patient body habitus and the dense, echogenic nature of the hepatic parenchyma. The main portal vein is patent with antegrade flow. The adjacent kidney appears markedly hypoechoic in comparison to  the liver consistent with advanced steatosis.  IMPRESSION: 1. Advanced hepatic steatosis. 2. No acute abnormality identified.   Electronically Signed   By: Jacqulynn Cadet M.D.   On: 01/12/2014 13:45

## 2014-01-26 NOTE — Progress Notes (Signed)
Cancelled.  

## 2014-01-26 NOTE — Assessment & Plan Note (Signed)
He is advised to begin an exercise program. He states he used to walk an hour a day, but stopped. I have advised him to begin walking 30 minutes 3 times a week and work up to daily, and then increase the time. Decrease sugar intake.

## 2014-01-26 NOTE — Assessment & Plan Note (Signed)
Good control currently.  Will increase imdur if he continues to have spasms. He remains on amlodipine.

## 2014-01-26 NOTE — Progress Notes (Signed)
HPI: Jerome Shepherd is a 48 y/o patient of Dr. Bronson Ing we are following for non-obstructive CAD per cath in 07/2011, and hypertension with hx of DM. Non-cardiac chest pain, and GERD. He was recently admitted to [facility] in July of 2015 with recurrent chest pain and ruled out for MI. Pain usually associated after eating. He had HIDA scan with mild biliary dyskinesia, and was recommend for medical therapy and low fat diet. Repeat ultrasound on 01/24/2014 demonstrated Mild hepatosplenomegaly with early cirrhosis and/or hepatic  steatosis.       He is here for ongoing evaluation and need to repeat stress test due to multiple CVRF. He is very anxious about the possibility of having a heart attack. He continues to have mild chest pressure, usually related to abdominal pain and pressure. He admits to drinking a lot of caffeine, Sun Drops, Iced Tea throughout the day. 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 He continues to have daily abdominal pain. He is due to see Dr. Sydell Axon this month.  Allergies  Allergen Reactions  . Codeine Nausea Only  . Sulfa Antibiotics Nausea Only    Current Outpatient Prescriptions  Medication Sig Dispense Refill  . amLODipine (NORVASC) 10 MG tablet Take 1 tablet (10 mg total) by mouth daily.  30 tablet  6  . aspirin 81 MG chewable tablet Chew 1 tablet (81 mg total) by mouth daily.  30 tablet  0  . diphenhydrAMINE (BENADRYL) 50 MG capsule Take 50 mg by mouth at bedtime as needed for itching.      . glyBURIDE micronized (GLYNASE) 6 MG tablet Take 6 mg by mouth daily with breakfast.       . isosorbide mononitrate (IMDUR) 60 MG 24 hr tablet Take 1 tablet (60 mg total) by mouth daily.  30 tablet  6  . Magnesium Gluconate 27.5 MG TABS Take 1 tablet by mouth as needed. cramps      . metFORMIN (GLUCOPHAGE) 500 MG tablet Take 500 mg by mouth daily with breakfast.       . pantoprazole (PROTONIX) 40 MG tablet Take 1 tablet (40 mg total) by mouth 2 (two) times daily.  60 tablet  0  .  simvastatin (ZOCOR) 40 MG tablet Take 40 mg by mouth every evening.      . sucralfate (CARAFATE) 1 G tablet Take 1 tablet (1 g total) by mouth 4 (four) times daily -  with meals and at bedtime.  42 tablet  0  . nitroGLYCERIN (NITROSTAT) 0.4 MG SL tablet Place 1 tablet (0.4 mg total) under the tongue every 5 (five) minutes as needed for chest pain.  25 tablet  4   No current facility-administered medications for this visit.    Past Medical History  Diagnosis Date  . Diabetes mellitus   . Sleep apnea   . Coronary artery spasm 2013  . CAD (coronary artery disease), minimal, non obstructive 2013 01/01/2013  . Hypertension   . Constipation   . Myocardial infarction   . Antral erosion 01/17/2014    Per EGD 01/10/14- not likely the cause of his symtoms     Past Surgical History  Procedure Laterality Date  . No past surgeries    . Cardiac catheterization  2013    non obstructive CAD  ~20%  . Esophagogastroduodenoscopy N/A 01/10/2014    Dr. Gala Romney: antral erosions, path with chronic inactive gastritis. Empiric dilation with 56-F dilation  . Savory dilation N/A 01/10/2014    Procedure: SAVORY DILATION;  Surgeon: Daneil Dolin, MD;  Location: [facility];  Service: Endoscopy;  Laterality: N/A;  Venia Minks dilation N/A 01/10/2014    Procedure: Venia Minks DILATION;  Surgeon: Daneil Dolin, MD;  Location: [facility];  Service: Endoscopy;  Laterality: N/A;    ROS:  Review of systems complete and found to be negative unless listed above  PHYSICAL EXAM BP 148/82  Pulse 84  Ht 6' 4"  (1.93 m)  Wt 311 lb (141.069 kg)  BMI 37.87 kg/m2  SpO2 95% General: Well developed, well nourished, in no acute distress Head: Eyes PERRLA, No xanthomas.   Normal cephalic and atramatic  Lungs: Clear bilaterally to auscultation and percussion. Heart: HRRR S1 S2, without MRG.  Pulses are 2+ & equal.            No carotid bruit. No JVD.  No abdominal bruits. No femoral bruits. Abdomen: Bowel sounds are positive,  abdomen soft and non-tender without masses or                  Hernia's noted. Msk:  Back normal, normal gait. Normal strength and tone for age. Extremities: No clubbing, cyanosis or edema.  DP +1 Neuro: Alert and oriented X 3. Psych:  Good affect, responds appropriately   ASSESSMENT AND PLAN

## 2014-02-01 ENCOUNTER — Telehealth: Payer: Self-pay | Admitting: *Deleted

## 2014-02-01 NOTE — Telephone Encounter (Signed)
Pt called stating his PCP does not give the hep shots and he would need a rx sent into his pharmacy [facility]. Please advise [phone]

## 2014-02-01 NOTE — Telephone Encounter (Signed)
Order for hep A and B vaccines (per [facility] instructions) has been faxed to the pharmacy.

## 2014-02-22 ENCOUNTER — Ambulatory Visit (INDEPENDENT_AMBULATORY_CARE_PROVIDER_SITE_OTHER): Payer: PRIVATE HEALTH INSURANCE | Admitting: Internal Medicine

## 2014-02-22 ENCOUNTER — Encounter: Payer: Self-pay | Admitting: Internal Medicine

## 2014-02-22 ENCOUNTER — Other Ambulatory Visit: Payer: Self-pay | Admitting: Internal Medicine

## 2014-02-22 VITALS — BP 142/77 | HR 73 | Temp 98.9°F | Ht 76.0 in | Wt 309.4 lb

## 2014-02-22 DIAGNOSIS — K760 Fatty (change of) liver, not elsewhere classified: Secondary | ICD-10-CM

## 2014-02-22 DIAGNOSIS — K7689 Other specified diseases of liver: Secondary | ICD-10-CM

## 2014-02-22 DIAGNOSIS — K828 Other specified diseases of gallbladder: Secondary | ICD-10-CM

## 2014-02-22 DIAGNOSIS — K219 Gastro-esophageal reflux disease without esophagitis: Secondary | ICD-10-CM

## 2014-02-22 NOTE — Patient Instructions (Addendum)
Schedule an office visit with Dr. Arnoldo Morale for possible cholecystectomy and liver biopsy (getting your gallbladder out may not alleviate all of your symptoms as discussed)  Continue Dexilant 60 mg daily

## 2014-02-22 NOTE — Progress Notes (Signed)
Primary Care Physician:  Jerome Shepherd., MD Primary Gastroenterologist:  Dr. Gala Shepherd  Pre-Procedure History & Physical: HPI:  Jerome Shepherd is a 48 y.o. male here for followup of atypical chest pain. Has some typical reflux symptoms well controlled on Dexilant recent course of Carafate. Tubular esophagus appeared normal recent EGD. Dysphagia better after an empiric passage of a Maloney dilator. Has continued to have some intermittent retrosternal chest discomfort. A thorough cardiac evaluation which failed to reveal a cardiac etiology for his symptoms or his vague intermittent right upper quadrant abdominal pain as well. Report of ultrasound negative for gallbladder disease however, I do a demonstrated diminished EF 27% however no typical reduction patient's symptoms. He had a very dense liver on ultrasound suspicious for fatty infiltration. Malar telangiectasias. No stigmata of cirrhosis on recent EGD. He underwent the last on her feet. Admit of your score came back at 4. He's back to discuss findings.  Quite anxious about his chest discomfort. He wants it "fixed". No dysphagia currently. His chest that is not incited by eating or consuming liquids. Bowel function described as normal with no hematochezia or melena. He remains significantly obese. He has type 2 diabetes mellitus. Jerome Shepherd desires to know definitively whether or not he has cirrhosis  Past Medical History  Diagnosis Date  . Diabetes mellitus   . Sleep apnea   . Coronary artery spasm 2013  . CAD (coronary artery disease), minimal, non obstructive 2013 01/01/2013  . Hypertension   . Constipation   . Myocardial infarction   . Antral erosion 01/17/2014    Per EGD 01/10/14- not likely the cause of his symtoms     Past Surgical History  Procedure Laterality Date  . Cardiac catheterization  2013    non obstructive CAD  ~20%  . Esophagogastroduodenoscopy N/A 01/10/2014    Dr. Gala Shepherd: antral erosions, path with chronic inactive gastritis.  Empiric dilation with 56-F dilation  . Savory dilation N/A 01/10/2014    Procedure: SAVORY DILATION;  Surgeon: Jerome Dolin, MD;  Location: [facility];  Service: Endoscopy;  Laterality: N/A;  Venia Minks dilation N/A 01/10/2014    Procedure: Venia Minks DILATION;  Surgeon: Jerome Dolin, MD;  Location: [facility];  Service: Endoscopy;  Laterality: N/A;    Prior to Admission medications   Medication Sig Start Date End Date Taking? Authorizing Provider  amLODipine (NORVASC) 10 MG tablet Take 1 tablet (10 mg total) by mouth daily. 08/23/13  Yes Herminio Commons, MD  aspirin 81 MG chewable tablet Chew 1 tablet (81 mg total) by mouth daily. 01/01/13  Yes Varney Biles, MD  diphenhydrAMINE (BENADRYL) 50 MG capsule Take 50 mg by mouth at bedtime as needed for itching.   Yes Historical Provider, MD  glyBURIDE micronized (GLYNASE) 6 MG tablet Take 6 mg by mouth daily with breakfast.    Yes Historical Provider, MD  isosorbide mononitrate (IMDUR) 60 MG 24 hr tablet Take 1 tablet (60 mg total) by mouth daily. 01/26/14  Yes Lendon Colonel, NP  Magnesium Gluconate 27.5 MG TABS Take 1 tablet by mouth as needed. cramps   Yes Historical Provider, MD  metFORMIN (GLUCOPHAGE) 500 MG tablet Take 500 mg by mouth daily with breakfast.    Yes Historical Provider, MD  nitroGLYCERIN (NITROSTAT) 0.4 MG SL tablet Place 1 tablet (0.4 mg total) under the tongue every 5 (five) minutes as needed for chest pain. 01/26/14  Yes Lendon Colonel, NP  pantoprazole (PROTONIX) 40 MG tablet Take 1  tablet (40 mg total) by mouth 2 (two) times daily. 01/18/14  Yes Lezlie Octave Black, NP  simvastatin (ZOCOR) 40 MG tablet Take 40 mg by mouth every evening.   Yes Historical Provider, MD    Allergies as of 02/22/2014 - Review Complete 02/22/2014  Allergen Reaction Noted  . Codeine Nausea Only 08/18/2011  . Sulfa antibiotics Nausea Only 08/18/2011    Family History  Problem Relation Age of Onset  . Diabetes type II Sister   . Heart  attack Cousin   . Colon cancer Neg Hx   . Stomach cancer Neg Hx     History   Social History  . Marital Status: Divorced    Spouse Name: N/A    Number of Children: N/A  . Years of Education: N/A   Occupational History  . truck driver    Social History Main Topics  . Smoking status: Former Smoker    Quit date: 08/18/1995  . Smokeless tobacco: Not on file     Comment: Quit smoking x 20 years  . Alcohol Use: No  . Drug Use: No  . Sexual Activity: Not Currently   Other Topics Concern  . Not on file   Social History Narrative   Divorced, no children, works as a Production designer, theatre/television/film.    Review of Systems: See HPI, otherwise negative ROS  Physical Exam: BP 142/77  Pulse 73  Temp(Src) 98.9 F (37.2 C) (Oral)  Ht 6\' 4"  (1.93 m)  Wt 309 lb 6.4 oz (140.343 kg)  BMI 37.68 kg/m2 General:   Alert,  Well-developed, well-nourished, pleasant and cooperative in NAD Skin:  Intact without significant lesions or rashes. Prominent malar telangiectasias. Eyes:  Sclera clear, no icterus.   Conjunctiva pink. Ears:  Normal auditory acuity. Nose:  No deformity, discharge,  or lesions. Mouth:  No deformity or lesions. Neck:  Supple; no masses or thyromegaly. No significant cervical adenopathy. Lungs:  Clear throughout to auscultation.   No wheezes, crackles, or rhonchi. No acute distress. Heart:  Regular rate and rhythm; no murmurs, clicks, rubs,  or gallops. Abdomen: Obese. Non-distended, normal bowel sounds.  He has some right upper quadrant tenderness to palpation. No appreciable mass or hepatosplenomegaly.  Pulses:  Normal pulses noted. Extremities:  Without clubbing or edema.  Impression:   48 year old obese gentleman with atypical chest pain. Cardiac etiology essentially ruled out. Normal EGD recently. Some right upper quadrant tenderness on physical examination today. Has no typical postprandial symptoms typical of  gallbladder disease.  I have called Dr. Arnoldo Morale and discussed  the case with him. He saw him recently hospitalized. Symptoms are not typical of gallbladder disease. There may be a musculoskeletal component , however. He has documented biliary dyskinesia on HIDA scan. His reflux symptoms well controlled. I doubt esophageal motility disorder.  Patient has significant malar telangiectasias. He has a dense liver on ultrasound. His Metavir score is 4 which is suggestive of cirrhosis however, not diagnostic. Patient wants to know the status of his liver without equivocation . He wants a liver biopsy as a separate issue. This is not unreasonable.  Recommendations: Schedule an office visit with Dr. Arnoldo Morale for possible cholecystectomy and liver biopsy. I explained to the patient in some detail there is no guarantee of resolution of symptoms if he gets his gallbladder removed. If cholecystectomy is offered, he certainly should undergo a liver biopsy as discussed with Dr. Ardis Hughs today. I have discussed the need for liver biopsy at that time the potential cholecystectomy with Dr.  Jenkins.  Continue Dexilant 60 mg daily  Further recommendations to follow    Notice: This dictation was prepared with Dragon dictation along with smaller phrase technology. Any transcriptional errors that result from this process are unintentional and may not be corrected upon review.

## 2014-03-09 NOTE — H&P (Signed)
  NTS SOAP Note  Vital Signs:  Vitals as of: 06/25/5850: Systolic 778: Diastolic 94: Heart Rate 73: Temp 97.41F: Height 9f 4in: Weight 314Lbs 0 Ounces: BMI 38.22  BMI : 38.22 kg/m2  Subjective: This 48year old male presents for of epigastric pain and reflux.  Also complains of nausea.  No fever,  chills,  jaundice.  Occurs sporadically and with food.  No fever,  jaundice.  Has had extensive workup with HIDA scan showing mild biliary dyskinesia with reproducible symptoms with cck.    Review of Symptoms:  Constitutional:unremarkable   Head:unremarkable Eyes:unremarkable   Nose/Mouth/Throat:unremarkable Cardiovascular:  unremarkable Respiratory:unremarkable Gastrointestinabdominal pain, heartburn Genitourinary:unremarkable   back Skin:unremarkable Hematolgic/Lymphatic:unremarkable   Allergic/Immunologic:unremarkable   Past Medical History:  Reviewed  Past Medical History    Social History:Reviewed  Social History  Preferred Language: English Race:  White Ethnicity: Not Hispanic / Latino Age: 89year Marital Status:  S Alcohol: no   Smoking Status: Never smoker reviewed on 03/08/2014 Functional Status reviewed on 03/08/2014 ------------------------------------------------ Bathing: Normal Cooking: Normal Dressing: Normal Driving: Normal Eating: Normal Managing Meds: Normal Oral Care: Normal Shopping: Normal Toileting: Normal Transferring: Normal Walking: Normal Cognitive Status reviewed on 03/08/2014 ------------------------------------------------ Attention: Normal Decision Making: Normal Language: Normal Memory: Normal Motor: Normal Perception: Normal Problem Solving: Normal Visual and Spatial: Normal   Family History:Reviewed  Family Health History Family History is Unknown    Objective Information: General:Well appearing, well nourished in no distress. Heart:RRR, no murmur or gallop.  Normal S1, S2.  No S3, S4.   Lungs:  CTA bilaterally, no wheezes, rhonchi, rales.  Breathing unlabored. Abdomen:Soft, NT/ND, no HSM, no masses.  Assessment:Chronic cholecystitis,  hepatomegaly  Diagnoses: 575.11  K81.1 Chronic cholecystitis (Chronic cholecystitis)  Procedures: 924235- OFFICE OUTPATIENT NEW 30 MINUTES    Plan:  Scheduled for laparoscopic cholecystectomy,  liver biopsy on 04/01/14.   Patient Education:Alternative treatments to surgery were discussed with patient (and family).  Risks and benefits  of procedure including bleeding,  infection,  hepatbiliary injury,  the possibility of an open procedure,  and the possibility of recurrence of symptoms were fully explained to the patient (and family) who gave informed consent. Patient/family questions were addressed.  Follow-up:Pending Surgery

## 2014-03-21 ENCOUNTER — Encounter (HOSPITAL_COMMUNITY): Payer: Self-pay | Admitting: Pharmacy Technician

## 2014-03-25 NOTE — Patient Instructions (Signed)
Jerome Shepherd  03/25/2014   Your procedure is scheduled on:  04/01/2014  Report to [facility] at 6:15 AM.  Call this number if you have problems the morning of surgery: [phone]   Remember:   Do not eat food or drink liquids after midnight.   Take these medicines the morning of surgery with A SIP OF WATER: AMLODIPINE, IMDUR, PROTONIX     DO NOT TAKE - GLYNASE OR METFORMIN   Do not wear jewelry, make-up or nail polish.  Do not wear lotions, powders, or perfumes. You may wear deodorant.  Do not shave 48 hours prior to surgery. Men may shave face and neck.  Do not bring valuables to the hospital.  [facility] is not responsible for any belongings or valuables.               Contacts, dentures or bridgework may not be worn into surgery.  Leave suitcase in the car. After surgery it may be brought to your room.  For patients admitted to the hospital, discharge time is determined by your treatment team.               Patients discharged the day of surgery will not be allowed to drive home.  Name and phone number of your driver:   Special Instructions: Shower using CHG 2 nights before surgery and the night before surgery.  If you shower the day of surgery use CHG.  Use special wash - you have one bottle of CHG for all showers.  You should use approximately 1/3 of the bottle for each shower.   Please read over the following fact sheets that you were given: Surgical Site Infection Prevention and Anesthesia Post-op Instructions   PATIENT INSTRUCTIONS POST-ANESTHESIA  IMMEDIATELY FOLLOWING SURGERY:  Do not drive or operate machinery for the first twenty four hours after surgery.  Do not make any important decisions for twenty four hours after surgery or while taking narcotic pain medications or sedatives.  If you develop intractable nausea and vomiting or a severe headache please notify your doctor immediately.  FOLLOW-UP:  Please make an appointment with your surgeon as instructed. You do  not need to follow up with anesthesia unless specifically instructed to do so.  WOUND CARE INSTRUCTIONS (if applicable):  Keep a dry clean dressing on the anesthesia/puncture wound site if there is drainage.  Once the wound has quit draining you may leave it open to air.  Generally you should leave the bandage intact for twenty four hours unless there is drainage.  If the epidural site drains for more than 36-48 hours please call the anesthesia department.  QUESTIONS?:  Please feel free to call your physician or the hospital operator if you have any questions, and they will be happy to assist you.      Laparoscopic Cholecystectomy Laparoscopic cholecystectomy is surgery to remove the gallbladder. The gallbladder is located in the upper right part of the abdomen, behind the liver. It is a storage sac for bile produced in the liver. Bile aids in the digestion and absorption of fats. Cholecystectomy is often done for inflammation of the gallbladder (cholecystitis). This condition is usually caused by a buildup of gallstones (cholelithiasis) in your gallbladder. Gallstones can block the flow of bile, resulting in inflammation and pain. In severe cases, emergency surgery may be required. When emergency surgery is not required, you will have time to prepare for the procedure. Laparoscopic surgery is an alternative to open surgery. Laparoscopic surgery has a  shorter recovery time. Your common bile duct may also need to be examined during the procedure. If stones are found in the common bile duct, they may be removed. LET [facility] CARE PROVIDER KNOW ABOUT:  Any allergies you have.  All medicines you are taking, including vitamins, herbs, eye drops, creams, and over-the-counter medicines.  Previous problems you or members of your family have had with the use of anesthetics.  Any blood disorders you have.  Previous surgeries you have had.  Medical conditions you have. RISKS AND  COMPLICATIONS Generally, this is a safe procedure. However, as with any procedure, complications can occur. Possible complications include:  Infection.  Damage to the common bile duct, nerves, arteries, veins, or other internal organs such as the stomach, liver, or intestines.  Bleeding.  A stone may remain in the common bile duct.  A bile leak from the cyst duct that is clipped when your gallbladder is removed.  The need to convert to open surgery, which requires a larger incision in the abdomen. This may be necessary if your surgeon thinks it is not safe to continue with a laparoscopic procedure. BEFORE THE PROCEDURE  Ask your health care provider about changing or stopping any regular medicines. You will need to stop taking aspirin or blood thinners at least 5 days prior to surgery.  Do not eat or drink anything after midnight the night before surgery.  Let your health care provider know if you develop a cold or other infectious problem before surgery. PROCEDURE   You will be given medicine to make you sleep through the procedure (general anesthetic). A breathing tube will be placed in your mouth.  When you are asleep, your surgeon will make several small cuts (incisions) in your abdomen.  A thin, lighted tube with a tiny camera on the end (laparoscope) is inserted through one of the small incisions. The camera on the laparoscope sends a picture to a TV screen in the operating room. This gives the surgeon a good view inside your abdomen.  A gas will be pumped into your abdomen. This expands your abdomen so that the surgeon has more room to perform the surgery.  Other tools needed for the procedure are inserted through the other incisions. The gallbladder is removed through one of the incisions.  After the removal of your gallbladder, the incisions will be closed with stitches, staples, or skin glue. AFTER THE PROCEDURE  You will be taken to a recovery area where your progress  will be checked often.  You may be allowed to go home the same day if your pain is controlled and you can tolerate liquids. Document Released: 06/10/2005 Document Revised: 03/31/2013 Document Reviewed: 01/20/2013 [facility] Patient Information 2015 [place], [place]. This information is not intended to replace advice given to you by your health care provider. Make sure you discuss any questions you have with your health care provider.

## 2014-03-28 ENCOUNTER — Encounter (HOSPITAL_COMMUNITY)
Admission: RE | Admit: 2014-03-28 | Discharge: 2014-03-28 | Disposition: A | Discharge status: Home / Self Care | Payer: No Typology Code available for payment source | Source: Ambulatory Visit | Attending: General Surgery | Admitting: General Surgery

## 2014-03-28 ENCOUNTER — Encounter (HOSPITAL_COMMUNITY): Payer: Self-pay

## 2014-03-28 DIAGNOSIS — I10 Essential (primary) hypertension: Secondary | ICD-10-CM | POA: Diagnosis not present

## 2014-03-28 DIAGNOSIS — K259 Gastric ulcer, unspecified as acute or chronic, without hemorrhage or perforation: Secondary | ICD-10-CM | POA: Diagnosis not present

## 2014-03-28 DIAGNOSIS — Z0000 Encounter for general adult medical examination without abnormal findings: Secondary | ICD-10-CM | POA: Insufficient documentation

## 2014-03-28 DIAGNOSIS — R160 Hepatomegaly, not elsewhere classified: Secondary | ICD-10-CM | POA: Insufficient documentation

## 2014-03-28 DIAGNOSIS — E119 Type 2 diabetes mellitus without complications: Secondary | ICD-10-CM | POA: Insufficient documentation

## 2014-03-28 DIAGNOSIS — E669 Obesity, unspecified: Secondary | ICD-10-CM | POA: Insufficient documentation

## 2014-03-28 DIAGNOSIS — G4730 Sleep apnea, unspecified: Secondary | ICD-10-CM | POA: Diagnosis not present

## 2014-03-28 DIAGNOSIS — Z882 Allergy status to sulfonamides status: Secondary | ICD-10-CM | POA: Diagnosis not present

## 2014-03-28 DIAGNOSIS — E079 Disorder of thyroid, unspecified: Secondary | ICD-10-CM | POA: Insufficient documentation

## 2014-03-28 DIAGNOSIS — Z885 Allergy status to narcotic agent status: Secondary | ICD-10-CM | POA: Insufficient documentation

## 2014-03-28 DIAGNOSIS — K811 Chronic cholecystitis: Secondary | ICD-10-CM | POA: Diagnosis not present

## 2014-03-28 DIAGNOSIS — K219 Gastro-esophageal reflux disease without esophagitis: Secondary | ICD-10-CM | POA: Diagnosis not present

## 2014-03-28 DIAGNOSIS — N539 Unspecified male sexual dysfunction: Secondary | ICD-10-CM | POA: Insufficient documentation

## 2014-03-28 DIAGNOSIS — I252 Old myocardial infarction: Secondary | ICD-10-CM | POA: Diagnosis not present

## 2014-03-28 LAB — HEPATIC FUNCTION PANEL
ALK PHOS: 86 U/L (ref 39–117)
ALT: 17 U/L (ref 0–53)
AST: 28 U/L (ref 0–37)
Albumin: 4 g/dL (ref 3.5–5.2)
Bilirubin, Direct: <0.2 mg/dL (ref 0.0–0.3)
Total Bilirubin: 0.4 mg/dL (ref 0.3–1.2)
Total Protein: 6.9 g/dL (ref 6.0–8.3)

## 2014-03-28 LAB — CBC WITH DIFFERENTIAL/PLATELET
BASOS ABS: 0.1 10*3/uL (ref 0.0–0.1)
BASOS PCT: 1 % (ref 0–1)
EOS PCT: 2 % (ref 0–5)
Eosinophils Absolute: 0.2 10*3/uL (ref 0.0–0.7)
HEMATOCRIT: 43.4 % (ref 39.0–52.0)
Hemoglobin: 15.8 g/dL (ref 13.0–17.0)
LYMPHS PCT: 35 % (ref 12–46)
Lymphs Abs: 2.7 10*3/uL (ref 0.7–4.0)
MCH: 31.6 pg (ref 26.0–34.0)
MCHC: 36.4 g/dL — AB (ref 30.0–36.0)
MCV: 86.8 fL (ref 78.0–100.0)
Monocytes Absolute: 0.4 10*3/uL (ref 0.1–1.0)
Monocytes Relative: 5 % (ref 3–12)
NEUTROS ABS: 4.4 10*3/uL (ref 1.7–7.7)
Neutrophils Relative %: 57 % (ref 43–77)
PLATELETS: 226 10*3/uL (ref 150–400)
RBC: 5 MIL/uL (ref 4.22–5.81)
RDW: 12.3 % (ref 11.5–15.5)
WBC: 7.7 10*3/uL (ref 4.0–10.5)

## 2014-03-28 LAB — BASIC METABOLIC PANEL
ANION GAP: 13 (ref 5–15)
BUN: 17 mg/dL (ref 6–23)
CALCIUM: 9.2 mg/dL (ref 8.4–10.5)
CO2: 24 meq/L (ref 19–32)
CREATININE: 0.91 mg/dL (ref 0.50–1.35)
Chloride: 99 mEq/L (ref 96–112)
GFR calc Af Amer: >90 mL/min (ref >90)
Glucose, Bld: 315 mg/dL — ABNORMAL HIGH (ref 70–99)
Potassium: 4.1 mEq/L (ref 3.7–5.3)
Sodium: 136 mEq/L — ABNORMAL LOW (ref 137–147)

## 2014-04-01 ENCOUNTER — Ambulatory Visit (HOSPITAL_COMMUNITY): Payer: No Typology Code available for payment source | Admitting: Anesthesiology

## 2014-04-01 ENCOUNTER — Encounter (HOSPITAL_COMMUNITY)
Admission: RE | Disposition: A | Discharge status: Home / Self Care | Payer: Self-pay | Source: Ambulatory Visit | Attending: General Surgery

## 2014-04-01 ENCOUNTER — Ambulatory Visit (HOSPITAL_COMMUNITY)
Admission: RE | Admit: 2014-04-01 | Discharge: 2014-04-01 | Disposition: A | Discharge status: Home / Self Care | Payer: No Typology Code available for payment source | Source: Ambulatory Visit | Attending: General Surgery | Admitting: General Surgery

## 2014-04-01 ENCOUNTER — Encounter (HOSPITAL_COMMUNITY): Payer: No Typology Code available for payment source | Admitting: Anesthesiology

## 2014-04-01 ENCOUNTER — Encounter (HOSPITAL_COMMUNITY): Payer: Self-pay | Admitting: *Deleted

## 2014-04-01 DIAGNOSIS — R160 Hepatomegaly, not elsewhere classified: Secondary | ICD-10-CM | POA: Diagnosis not present

## 2014-04-01 DIAGNOSIS — K7581 Nonalcoholic steatohepatitis (NASH): Secondary | ICD-10-CM | POA: Diagnosis not present

## 2014-04-01 DIAGNOSIS — K811 Chronic cholecystitis: Secondary | ICD-10-CM | POA: Diagnosis present

## 2014-04-01 HISTORY — PX: CHOLECYSTECTOMY: SHX55

## 2014-04-01 HISTORY — PX: LIVER BIOPSY: SHX301

## 2014-04-01 LAB — GLUCOSE, CAPILLARY
GLUCOSE-CAPILLARY: 260 mg/dL — AB (ref 70–99)
GLUCOSE-CAPILLARY: 257 mg/dL — AB (ref 70–99)

## 2014-04-01 SURGERY — LAPAROSCOPIC CHOLECYSTECTOMY
Anesthesia: General | Site: Abdomen

## 2014-04-01 MED ORDER — FENTANYL CITRATE 0.05 MG/ML IJ SOLN
INTRAMUSCULAR | Status: AC
  Filled 2014-04-01: qty 5

## 2014-04-01 MED ORDER — NEOSTIGMINE METHYLSULFATE 10 MG/10ML IV SOLN
INTRAVENOUS | Status: AC
Start: 2014-04-01 — End: 2014-04-01
  Filled 2014-04-01: qty 1

## 2014-04-01 MED ORDER — NEOSTIGMINE METHYLSULFATE 10 MG/10ML IV SOLN
INTRAVENOUS | Status: DC | PRN
  Administered 2014-04-01: 5 mg via INTRAVENOUS

## 2014-04-01 MED ORDER — BUPIVACAINE HCL (PF) 0.5 % IJ SOLN
INTRAMUSCULAR | Status: DC | PRN
  Administered 2014-04-01: 10 mL

## 2014-04-01 MED ORDER — FENTANYL CITRATE 0.05 MG/ML IJ SOLN
INTRAMUSCULAR | Status: DC | PRN
  Administered 2014-04-01: 100 ug via INTRAVENOUS
  Administered 2014-04-01 (×5): 50 ug via INTRAVENOUS

## 2014-04-01 MED ORDER — FENTANYL CITRATE 0.05 MG/ML IJ SOLN
INTRAMUSCULAR | Status: AC
Start: 2014-04-01 — End: 2014-04-01
  Filled 2014-04-01: qty 5

## 2014-04-01 MED ORDER — PROPOFOL 10 MG/ML IV EMUL
INTRAVENOUS | Status: AC
  Filled 2014-04-01: qty 20

## 2014-04-01 MED ORDER — DEXTROSE 5 % IV SOLN
2.0000 g | INTRAVENOUS | Status: AC
  Administered 2014-04-01: 2 g via INTRAVENOUS
  Filled 2014-04-01: qty 2

## 2014-04-01 MED ORDER — KETOROLAC TROMETHAMINE 30 MG/ML IJ SOLN
30.0000 mg | Freq: Once | INTRAMUSCULAR | Status: AC
  Administered 2014-04-01: 30 mg via INTRAVENOUS
  Filled 2014-04-01: qty 1

## 2014-04-01 MED ORDER — GLYCOPYRROLATE 0.2 MG/ML IJ SOLN
INTRAMUSCULAR | Status: DC | PRN
  Administered 2014-04-01: .8 mg via INTRAVENOUS

## 2014-04-01 MED ORDER — ROCURONIUM BROMIDE 50 MG/5ML IV SOLN
INTRAVENOUS | Status: AC
  Filled 2014-04-01: qty 1

## 2014-04-01 MED ORDER — MIDAZOLAM HCL 2 MG/2ML IJ SOLN
INTRAMUSCULAR | Status: AC
  Filled 2014-04-01: qty 2

## 2014-04-01 MED ORDER — GLYCOPYRROLATE 0.2 MG/ML IJ SOLN
INTRAMUSCULAR | Status: AC
  Filled 2014-04-01: qty 3

## 2014-04-01 MED ORDER — ONDANSETRON HCL 4 MG/2ML IJ SOLN
4.0000 mg | Freq: Once | INTRAMUSCULAR | Status: AC
Start: 2014-04-01 — End: 2014-04-01
  Administered 2014-04-01: 4 mg via INTRAVENOUS

## 2014-04-01 MED ORDER — POVIDONE-IODINE 10 % EX OINT
TOPICAL_OINTMENT | CUTANEOUS | Status: AC
  Filled 2014-04-01: qty 1

## 2014-04-01 MED ORDER — LACTATED RINGERS IV SOLN
INTRAVENOUS | Status: DC
  Administered 2014-04-01: 1000 mL via INTRAVENOUS

## 2014-04-01 MED ORDER — HEMOSTATIC AGENTS (NO CHARGE) OPTIME
TOPICAL | Status: DC | PRN
  Administered 2014-04-01: 1 via TOPICAL

## 2014-04-01 MED ORDER — SODIUM CHLORIDE 0.9 % IR SOLN
Status: DC | PRN
  Administered 2014-04-01: 500 mL

## 2014-04-01 MED ORDER — FENTANYL CITRATE 0.05 MG/ML IJ SOLN
25.0000 ug | INTRAMUSCULAR | Status: DC | PRN
  Administered 2014-04-01 (×2): 50 ug via INTRAVENOUS
  Filled 2014-04-01: qty 2

## 2014-04-01 MED ORDER — SUCCINYLCHOLINE CHLORIDE 20 MG/ML IJ SOLN
INTRAMUSCULAR | Status: AC
  Filled 2014-04-01: qty 1

## 2014-04-01 MED ORDER — ONDANSETRON HCL 4 MG/2ML IJ SOLN
INTRAMUSCULAR | Status: AC
  Filled 2014-04-01: qty 2

## 2014-04-01 MED ORDER — ONDANSETRON HCL 4 MG/2ML IJ SOLN
4.0000 mg | Freq: Once | INTRAMUSCULAR | Status: AC | PRN
  Administered 2014-04-01: 4 mg via INTRAVENOUS
  Filled 2014-04-01: qty 2

## 2014-04-01 MED ORDER — CHLORHEXIDINE GLUCONATE 4 % EX LIQD: 1.0000 "application " | Freq: Once | CUTANEOUS | Status: DC

## 2014-04-01 MED ORDER — MIDAZOLAM HCL 2 MG/2ML IJ SOLN
1.0000 mg | INTRAMUSCULAR | Status: DC | PRN
  Administered 2014-04-01: 2 mg via INTRAVENOUS

## 2014-04-01 MED ORDER — DEXAMETHASONE SODIUM PHOSPHATE 4 MG/ML IJ SOLN
4.0000 mg | Freq: Once | INTRAMUSCULAR | Status: AC
  Administered 2014-04-01: 4 mg via INTRAVENOUS

## 2014-04-01 MED ORDER — OXYCODONE-ACETAMINOPHEN 7.5-325 MG PO TABS: 1.0000 | ORAL_TABLET | ORAL | Status: DC | PRN

## 2014-04-01 MED ORDER — LIDOCAINE HCL (CARDIAC) 20 MG/ML IV SOLN
INTRAVENOUS | Status: DC | PRN
  Administered 2014-04-01 (×2): 50 mg via INTRAVENOUS

## 2014-04-01 MED ORDER — DEXAMETHASONE SODIUM PHOSPHATE 4 MG/ML IJ SOLN
INTRAMUSCULAR | Status: AC
  Filled 2014-04-01: qty 1

## 2014-04-01 MED ORDER — LIDOCAINE HCL (PF) 1 % IJ SOLN
INTRAMUSCULAR | Status: AC
  Filled 2014-04-01: qty 5

## 2014-04-01 MED ORDER — ROCURONIUM BROMIDE 100 MG/10ML IV SOLN
INTRAVENOUS | Status: DC | PRN
  Administered 2014-04-01: 15 mg via INTRAVENOUS
  Administered 2014-04-01: 10 mg via INTRAVENOUS
  Administered 2014-04-01: 25 mg via INTRAVENOUS

## 2014-04-01 MED ORDER — BUPIVACAINE HCL (PF) 0.5 % IJ SOLN
INTRAMUSCULAR | Status: AC
  Filled 2014-04-01: qty 30

## 2014-04-01 MED ORDER — POVIDONE-IODINE 10 % OINT PACKET
TOPICAL_OINTMENT | CUTANEOUS | Status: DC | PRN
  Administered 2014-04-01: 1 via TOPICAL

## 2014-04-01 MED ORDER — PROPOFOL 10 MG/ML IV BOLUS
INTRAVENOUS | Status: DC | PRN
  Administered 2014-04-01: 200 mg via INTRAVENOUS

## 2014-04-01 SURGICAL SUPPLY — 59 items
APPLIER CLIP LAPSCP 10X32 DD (CLIP) ×3 IMPLANT
BAG HAMPER (MISCELLANEOUS) ×3 IMPLANT
BAG SPEC RTRVL LRG 6X4 10 (ENDOMECHANICALS) ×1
BLADE 11 SAFETY STRL DISP (BLADE) ×3 IMPLANT
CLOSURE WOUND 1/4 X3 (GAUZE/BANDAGES/DRESSINGS) ×1
CLOTH BEACON ORANGE TIMEOUT ST (SAFETY) ×3 IMPLANT
COVER LIGHT HANDLE STERIS (MISCELLANEOUS) ×6 IMPLANT
DECANTER SPIKE VIAL GLASS SM (MISCELLANEOUS) ×3 IMPLANT
DURAPREP 26ML APPLICATOR (WOUND CARE) ×3 IMPLANT
ELECT REM PT RETURN 9FT ADLT (ELECTROSURGICAL) ×3
ELECTRODE REM PT RTRN 9FT ADLT (ELECTROSURGICAL) ×1 IMPLANT
FILTER SMOKE EVAC LAPAROSHD (FILTER) ×3 IMPLANT
FORMALIN 10 PREFIL 120ML (MISCELLANEOUS) ×3 IMPLANT
GLOVE ECLIPSE 6.5 STRL STRAW (GLOVE) ×2 IMPLANT
GLOVE INDICATOR 7.0 STRL GRN (GLOVE) ×4 IMPLANT
GLOVE SS BIOGEL STRL SZ 6.5 (GLOVE) IMPLANT
GLOVE SUPERSENSE BIOGEL SZ 6.5 (GLOVE) ×2
GLOVE SURG SS PI 7.5 STRL IVOR (GLOVE) ×6 IMPLANT
GOWN STRL REUS W/ TWL XL LVL3 (GOWN DISPOSABLE) ×1 IMPLANT
GOWN STRL REUS W/TWL LRG LVL3 (GOWN DISPOSABLE) ×9 IMPLANT
GOWN STRL REUS W/TWL XL LVL3 (GOWN DISPOSABLE) ×3
HEMOSTAT SNOW SURGICEL 2X4 (HEMOSTASIS) ×3 IMPLANT
INST SET LAPROSCOPIC AP (KITS) ×3 IMPLANT
IV NS IRRIG 3000ML ARTHROMATIC (IV SOLUTION) IMPLANT
KIT ROOM TURNOVER APOR (KITS) ×3 IMPLANT
MANIFOLD NEPTUNE II (INSTRUMENTS) ×3 IMPLANT
NDL BIOPSY 14GX4.5 SOFT TIS (NEEDLE) IMPLANT
NDL BIOPSY 14X6 SOFT TISS (NEEDLE) IMPLANT
NDL HYPO 18GX1.5 BLUNT FILL (NEEDLE) IMPLANT
NDL INSUFFLATION 14GA 120MM (NEEDLE) ×1 IMPLANT
NEEDLE BIOPSY 14GX4.5 SOFT TIS (NEEDLE) IMPLANT
NEEDLE BIOPSY 14X6 SOFT TISS (NEEDLE) ×3 IMPLANT
NEEDLE HYPO 18GX1.5 BLUNT FILL (NEEDLE) IMPLANT
NEEDLE HYPO 25X1 1.5 SAFETY (NEEDLE) ×3 IMPLANT
NEEDLE INSUFFLATION 14GA 120MM (NEEDLE) ×3 IMPLANT
NS IRRIG 1000ML POUR BTL (IV SOLUTION) ×3 IMPLANT
PACK LAP CHOLE LZT030E (CUSTOM PROCEDURE TRAY) ×3 IMPLANT
PACK MINOR (CUSTOM PROCEDURE TRAY) ×3 IMPLANT
PAD ARMBOARD 7.5X6 YLW CONV (MISCELLANEOUS) ×3 IMPLANT
PAD TELFA 3X4 1S STER (GAUZE/BANDAGES/DRESSINGS) ×2 IMPLANT
POUCH SPECIMEN RETRIEVAL 10MM (ENDOMECHANICALS) ×3 IMPLANT
SET BASIN LINEN APH (SET/KITS/TRAYS/PACK) ×3 IMPLANT
SET TUBE IRRIG SUCTION NO TIP (IRRIGATION / IRRIGATOR) IMPLANT
SLEEVE ENDOPATH XCEL 5M (ENDOMECHANICALS) ×3 IMPLANT
SPONGE GAUZE 2X2 8PLY STER LF (GAUZE/BANDAGES/DRESSINGS) ×4
SPONGE GAUZE 2X2 8PLY STRL LF (GAUZE/BANDAGES/DRESSINGS) ×8 IMPLANT
STAPLER VISISTAT (STAPLE) ×3 IMPLANT
STRIP CLOSURE SKIN 1/4X3 (GAUZE/BANDAGES/DRESSINGS) ×2 IMPLANT
SUT VIC AB 4-0 PS2 27 (SUTURE) ×3 IMPLANT
SUT VICRYL 0 UR6 27IN ABS (SUTURE) ×3 IMPLANT
SYRINGE CONTROL L 12CC (SYRINGE) ×3 IMPLANT
TAPE CLOTH SURG 4X10 WHT LF (GAUZE/BANDAGES/DRESSINGS) ×2 IMPLANT
TOWEL OR 17X26 4PK STRL BLUE (TOWEL DISPOSABLE) ×3 IMPLANT
TROCAR ENDO BLADELESS 11MM (ENDOMECHANICALS) ×3 IMPLANT
TROCAR XCEL NON-BLD 5MMX100MML (ENDOMECHANICALS) ×3 IMPLANT
TROCAR XCEL UNIV SLVE 11M 100M (ENDOMECHANICALS) ×3 IMPLANT
TUBING INSUFFLATION (TUBING) ×3 IMPLANT
WARMER LAPAROSCOPE (MISCELLANEOUS) ×3 IMPLANT
YANKAUER SUCT 12FT TUBE ARGYLE (SUCTIONS) ×3 IMPLANT

## 2014-04-01 NOTE — Discharge Instructions (Signed)
Laparoscopic Cholecystectomy, Care After Refer to this sheet in the next few weeks. These instructions provide you with information on caring for yourself after your procedure. Your health care provider may also give you more specific instructions. Your treatment has been planned according to current medical practices, but problems sometimes occur. Call your health care provider if you have any problems or questions after your procedure. WHAT TO EXPECT AFTER THE PROCEDURE After your procedure, it is typical to have the following:  Pain at your incision sites. You will be given pain medicines to control the pain.  Mild nausea or vomiting. This should improve after the first 24 hours.  Bloating and possibly shoulder pain from the gas used during the procedure. This will improve after the first 24 hours. HOME CARE INSTRUCTIONS   Change bandages (dressings) as directed by your health care provider.  Keep the wound dry and clean. You may wash the wound gently with soap and water. Gently blot or dab the area dry.  Do not take baths or use swimming pools or hot tubs for 2 weeks or until your health care provider approves.  Only take over-the-counter or prescription medicines as directed by your health care provider.  Continue your normal diet as directed by your health care provider.  Do not lift anything heavier than 10 pounds (4.5 kg) until your health care provider approves.  Do not play contact sports for 1 week or until your health care provider approves. SEEK MEDICAL CARE IF:   You have redness, swelling, or increasing pain in the wound.  You notice yellowish-white fluid (pus) coming from the wound.  You have drainage from the wound that lasts longer than 1 day.  You notice a bad smell coming from the wound or dressing.  Your surgical cuts (incisions) break open. SEEK IMMEDIATE MEDICAL CARE IF:   You develop a rash.  You have difficulty breathing.  You have chest pain.  You  have a fever.  You have increasing pain in the shoulders (shoulder strap areas).  You have dizzy episodes or faint while standing.  You have severe abdominal pain.  You feel sick to your stomach (nauseous) or throw up (vomit) and this lasts for more than 1 day. Document Released: 06/10/2005 Document Revised: 03/31/2013 Document Reviewed: 01/20/2013 [facility] Patient Information 2015 [place], [place]. This information is not intended to replace advice given to you by your health care provider. Make sure you discuss any questions you have with your health care provider.

## 2014-04-01 NOTE — Op Note (Signed)
Patient:  Jerome Shepherd  DOB:  July 21, 1965  MRN:  [id number]   Preop Diagnosis:  Chronic cholecystitis, hepatitis  Postop Diagnosis:  Same  Procedure:  Laparoscopic cholecystectomy, liver biopsy  Surgeon:  Aviva Signs, M.D.  Anes:  General endotracheal  Indications:  Patient is a 48 year old white male who presents with biliary colic secondary to chronic cholecystitis. He also has a history of hepatitis and Dr. Gala Romney of gastroenterology would like her liver biopsy. The risks and benefits of both procedures including bleeding, infection, hepatobiliary injury, and the possibility of an open procedure were fully explained to the patient, who gave informed consent.  Procedure note:  The patient was placed the supine position. After induction of general endotracheal anesthesia, the abdomen was prepped and draped using usual sterile technique with DuraPrep. Surgical site confirmation was performed.  A supraumbilical incision was made down to the fascia. A Veress needle was introduced into the abdominal cavity and confirmation of placement was done using the saline drop test. The abdomen was then insufflated to 16 mm mercury pressure. An 11 mm trocar was introduced into the abdominal cavity under direct visualization without difficulty. The patient was placed in reverse Trendelenburg position and additional 11 mm trocar was placed the epigastric region and 5 mm trochars were placed the right the quadrant right flank regions. The liver was inspected and appeared to be within normal limits. A Tru-Cut needle biopsy was performed of the right lobe of liver. This was sent to pathology further examination. The puncture site was cauterized using Bovie electrocautery. The gallbladder was retracted in a dynamic fashion in order to expose the triangle of Calot. The cystic duct was first identified. Its junction to the infundibulum was fully identified. Endoclips were placed proximally distally on the cystic duct,  and the cystic duct was divided. This was likewise done cystic artery. The gallbladder was freed away from the gallbladder fossa using Bovie electrocautery. The gallbladder was delivered through the epigastric trocar site using an Endo Catch bag. The gallbladder fossa was inspected and no abnormal bleeding or bile leakage was noted. Surgicel is placed the gallbladder fossa. All fluid and air were then evacuated from the abdominal cavity to removal of the trochars.  All wounds were irrigated with normal saline. All wounds were injected with 0.5% Sensorcaine. The suprabuccal fashion as well as epigastric fascia reapproximated using 0 Vicryl interrupted sutures. All skin incisions were closed using staples. Betadine ointment and dry sterile dressings were applied.  All tape and needle counts were correct the end of the procedure. Patient was extubated in the operating room and transferred to PACU in stable condition.  Complications:  None  EBL:  Minimal  Specimen:  Liver biopsy, gallbladder

## 2014-04-01 NOTE — Anesthesia Postprocedure Evaluation (Signed)
  Anesthesia Post-op Note  Patient: Jerome Shepherd  Procedure(s) Performed: Procedure(s): LAPAROSCOPIC CHOLECYSTECTOMY (N/A) LIVER BIOPSY (N/A)  Patient Location: PACU  Anesthesia Type:General  Level of Consciousness: awake and alert   Airway and Oxygen Therapy: Patient Spontanous Breathing and Patient connected to face mask oxygen  Post-op Pain: moderate  Post-op Assessment: Post-op Vital signs reviewed, Patient's Cardiovascular Status Stable, Respiratory Function Stable, Patent Airway and No signs of Nausea or vomiting  Post-op Vital Signs: Reviewed and stable  Last Vitals:  Filed Vitals:   04/01/14 0730  BP: 129/78  Pulse:   Temp:   Resp: 15    Complications: No apparent anesthesia complications

## 2014-04-01 NOTE — Transfer of Care (Signed)
Immediate Anesthesia Transfer of Care Note  Patient: Jerome Shepherd  Procedure(s) Performed: Procedure(s): LAPAROSCOPIC CHOLECYSTECTOMY (N/A) LIVER BIOPSY (N/A)  Patient Location: PACU  Anesthesia Type:General  Level of Consciousness: awake and alert   Airway & Oxygen Therapy: Patient Spontanous Breathing and Patient connected to face mask oxygen  Post-op Assessment: Report given to PACU RN  Post vital signs: Reviewed and stable  Complications: No apparent anesthesia complications

## 2014-04-01 NOTE — Anesthesia Procedure Notes (Signed)
Procedure Name: Intubation Date/Time: 04/01/2014 7:43 AM Performed by: Tressie Stalker E Pre-anesthesia Checklist: Patient identified, Patient being monitored, Timeout performed, Emergency Drugs available and Suction available Patient Re-evaluated:Patient Re-evaluated prior to inductionOxygen Delivery Method: Circle System Utilized Preoxygenation: Pre-oxygenation with 100% oxygen Intubation Type: IV induction, Rapid sequence and Cricoid Pressure applied Ventilation: Mask ventilation without difficulty Laryngoscope Size: Mac and 3 Grade View: Grade I Tube type: Oral Tube size: 8.0 mm Number of attempts: 1 Airway Equipment and Method: stylet Placement Confirmation: ETT inserted through vocal cords under direct vision,  positive ETCO2 and breath sounds checked- equal and bilateral Secured at: 23 cm Tube secured with: Tape Dental Injury: Teeth and Oropharynx as per pre-operative assessment

## 2014-04-01 NOTE — Interval H&P Note (Signed)
History and Physical Interval Note:  04/01/2014 7:18 AM  Jerome Shepherd  has presented today for surgery, with the diagnosis of chronic cholecystitis, hepatomegaly  The various methods of treatment have been discussed with the patient and family. After consideration of risks, benefits and other options for treatment, the patient has consented to  Procedure(s): LAPAROSCOPIC CHOLECYSTECTOMY (N/A) LIVER BIOPSY (N/A) as a surgical intervention .  The patient's history has been reviewed, patient examined, no change in status, stable for surgery.  I have reviewed the patient's chart and labs.  Questions were answered to the patient's satisfaction.     Aviva Signs A

## 2014-04-01 NOTE — Anesthesia Preprocedure Evaluation (Signed)
Anesthesia Evaluation  Patient identified by MRN, date of birth, ID band Patient awake    Reviewed: Allergy & Precautions, H&P , NPO status , Patient's Chart, lab work & pertinent test results  Airway Mallampati: II TM Distance: >3 FB Neck ROM: Full    Dental  (+) Teeth Intact   Pulmonary sleep apnea , former smoker,  breath sounds clear to auscultation        Cardiovascular hypertension, Pt. on medications + CAD (2012, coronary spasm) and + Past MI Rhythm:Regular Rate:Normal     Neuro/Psych    GI/Hepatic PUD, GERD-  Controlled and Medicated,  Endo/Other  diabetes, Poorly Controlled, Type 2, Oral Hypoglycemic Agents  Renal/GU      Musculoskeletal   Abdominal   Peds  Hematology   Anesthesia Other Findings   Reproductive/Obstetrics                           Anesthesia Physical Anesthesia Plan  ASA: III  Anesthesia Plan: General   Post-op Pain Management:    Induction: Intravenous, Rapid sequence and Cricoid pressure planned  Airway Management Planned: Oral ETT  Additional Equipment:   Intra-op Plan:   Post-operative Plan: Extubation in OR  Informed Consent: I have reviewed the patients History and Physical, chart, labs and discussed the procedure including the risks, benefits and alternatives for the proposed anesthesia with the patient or authorized representative who has indicated his/her understanding and acceptance.     Plan Discussed with:   Anesthesia Plan Comments:         Anesthesia Quick Evaluation

## 2014-04-04 ENCOUNTER — Encounter (HOSPITAL_COMMUNITY): Payer: Self-pay | Admitting: General Surgery

## 2014-04-07 ENCOUNTER — Telehealth: Payer: Self-pay | Admitting: Internal Medicine

## 2014-04-07 NOTE — Telephone Encounter (Signed)
Patient was calling to see if his liver biopsy results were back yet. Please call [phone]

## 2014-04-07 NOTE — Telephone Encounter (Signed)
Liver bx was done by Dr.Jenkins. Dr. Gala Romney, will you give the pt those results or will Dr. Arnoldo Morale?

## 2014-04-10 NOTE — Telephone Encounter (Signed)
Pt with mild fibrosis; no cirrhosis based on small liver sample.  Less damage thatn Metvir suggests although path specimen is subject to sampling error and distribution of disease may be patchy.  Pt needs an ov to revview at some point.  May or may not need cirrhosis care based on biopsy

## 2014-04-19 NOTE — Telephone Encounter (Signed)
Tried to call pt- LM

## 2014-04-19 NOTE — Telephone Encounter (Signed)
Pt is aware of results and he has an appointment with [facility] on 06/14/14 @ 3:00

## 2014-04-22 ENCOUNTER — Encounter: Payer: Self-pay | Admitting: Cardiovascular Disease

## 2014-04-22 ENCOUNTER — Ambulatory Visit (INDEPENDENT_AMBULATORY_CARE_PROVIDER_SITE_OTHER): Payer: PRIVATE HEALTH INSURANCE | Admitting: Cardiovascular Disease

## 2014-04-22 VITALS — BP 158/90 | HR 65 | Ht 76.0 in | Wt 316.0 lb

## 2014-04-22 DIAGNOSIS — K219 Gastro-esophageal reflux disease without esophagitis: Secondary | ICD-10-CM

## 2014-04-22 DIAGNOSIS — R1310 Dysphagia, unspecified: Secondary | ICD-10-CM

## 2014-04-22 DIAGNOSIS — R079 Chest pain, unspecified: Secondary | ICD-10-CM

## 2014-04-22 DIAGNOSIS — N539 Unspecified male sexual dysfunction: Secondary | ICD-10-CM

## 2014-04-22 DIAGNOSIS — I10 Essential (primary) hypertension: Secondary | ICD-10-CM

## 2014-04-22 DIAGNOSIS — R1314 Dysphagia, pharyngoesophageal phase: Secondary | ICD-10-CM

## 2014-04-22 DIAGNOSIS — E119 Type 2 diabetes mellitus without complications: Secondary | ICD-10-CM

## 2014-04-22 DIAGNOSIS — E669 Obesity, unspecified: Secondary | ICD-10-CM

## 2014-04-22 DIAGNOSIS — I201 Angina pectoris with documented spasm: Secondary | ICD-10-CM

## 2014-04-22 DIAGNOSIS — R1319 Other dysphagia: Secondary | ICD-10-CM

## 2014-04-22 DIAGNOSIS — I2583 Coronary atherosclerosis due to lipid rich plaque: Secondary | ICD-10-CM

## 2014-04-22 DIAGNOSIS — I2510 Atherosclerotic heart disease of native coronary artery without angina pectoris: Secondary | ICD-10-CM

## 2014-04-22 DIAGNOSIS — G4733 Obstructive sleep apnea (adult) (pediatric): Secondary | ICD-10-CM

## 2014-04-22 MED ORDER — AMLODIPINE BESYLATE 10 MG PO TABS: 10.0000 mg | ORAL_TABLET | Freq: Every day | ORAL | Status: DC

## 2014-04-22 NOTE — Progress Notes (Signed)
Patient ID: Jerome Shepherd, male   DOB: [birth year], 48 y.o.   MRN: [id number]      SUBJECTIVE: The patient is here to followup for chest pain. In summary, he is a 48 year old male with a past medical history of nonobstructive coronary artery disease, diabetes mellitus, and hypertension. He underwent cardiac catheterization for possible MI in February 2013 which revealed coronary vasospasm. He has a known history of gastroesophageal reflux disease. He is also noted to have esophageal spasm and follows with GI. He underwent laparoscopic cholecystectomy by Dr. Arnoldo Morale on 10/9 with a liver biopsy with preliminary reports demonstrating fibrosis. He is no longer having any significant chest pain for the first time in a long time. He has occasionally had some mild palpitations. He has sleep apnea and is being fitted for mouth/jaw spacers by Dr. Redmond Pulling. He occasionally has some chest tightness when he is cranking heavy equipment in the cold but does not have chest pain while walking or climbing stairs.    Review of Systems: As per "subjective", otherwise negative.  Allergies  Allergen Reactions  . Codeine Nausea Only  . Sulfa Antibiotics Nausea Only    Current Outpatient Prescriptions  Medication Sig Dispense Refill  . amLODipine (NORVASC) 5 MG tablet Take 5 mg by mouth daily.      Marland Kitchen glyBURIDE micronized (GLYNASE) 6 MG tablet Take 6 mg by mouth daily with breakfast.       . isosorbide mononitrate (IMDUR) 60 MG 24 hr tablet Take 1 tablet (60 mg total) by mouth daily.  30 tablet  6  . Magnesium Gluconate 27.5 MG TABS Take 1 tablet by mouth as needed. cramps      . metFORMIN (GLUCOPHAGE) 500 MG tablet Take 500 mg by mouth daily with breakfast.       . nitroGLYCERIN (NITROSTAT) 0.4 MG SL tablet Place 1 tablet (0.4 mg total) under the tongue every 5 (five) minutes as needed for chest pain.  25 tablet  4  . pantoprazole (PROTONIX) 40 MG tablet Take 1 tablet (40 mg total) by mouth 2 (two) times daily.  60  tablet  0  . simvastatin (ZOCOR) 40 MG tablet Take 40 mg by mouth every evening.      Marland Kitchen aspirin 81 MG chewable tablet Chew 1 tablet (81 mg total) by mouth daily.  30 tablet  0  . ibuprofen (ADVIL,MOTRIN) 200 MG tablet Take 200 mg by mouth 2 (two) times daily as needed for moderate pain.       No current facility-administered medications for this visit.    Past Medical History  Diagnosis Date  . Diabetes mellitus   . Sleep apnea   . CAD (coronary artery disease), minimal, non obstructive 2013 01/01/2013  . Hypertension   . Constipation   . Myocardial infarction   . Antral erosion 01/17/2014    Per EGD 01/10/14- not likely the cause of his symtoms   . Coronary artery spasm 2013    Past Surgical History  Procedure Laterality Date  . Esophagogastroduodenoscopy N/A 01/10/2014    Dr. Gala Romney: antral erosions, path with chronic inactive gastritis. Empiric dilation with 56-F dilation  . Savory dilation N/A 01/10/2014    Procedure: SAVORY DILATION;  Surgeon: Daneil Dolin, MD;  Location: [facility];  Service: Endoscopy;  Laterality: N/A;  Venia Minks dilation N/A 01/10/2014    Procedure: Venia Minks DILATION;  Surgeon: Daneil Dolin, MD;  Location: [facility];  Service: Endoscopy;  Laterality: N/A;  . Cardiac catheterization  2013    non obstructive CAD  ~20%  . Knee arthroscopy Right 2002  . Cholecystectomy N/A 04/01/2014    Procedure: LAPAROSCOPIC CHOLECYSTECTOMY;  Surgeon: Jamesetta So, MD;  Location: [facility];  Service: General;  Laterality: N/A;  . Liver biopsy N/A 04/01/2014    Procedure: LIVER BIOPSY;  Surgeon: Jamesetta So, MD;  Location: [facility];  Service: General;  Laterality: N/A;    History   Social History  . Marital Status: Divorced    Spouse Name: N/A    Number of Children: N/A  . Years of Education: N/A   Occupational History  . truck driver    Social History Main Topics  . Smoking status: Former Smoker    Quit date: 08/18/1995  . Smokeless tobacco: Not on file      Comment: Quit smoking x 20 years  . Alcohol Use: No  . Drug Use: No  . Sexual Activity: Not Currently   Other Topics Concern  . Not on file   Social History Narrative   Divorced, no children, works as a Production designer, theatre/television/film.     Filed Vitals:   04/22/14 1535  BP: 158/90  Pulse: 65  Height: 6' 4"  (1.93 m)  Weight: 316 lb (143.337 kg)    PHYSICAL EXAM General: NAD HEENT: Normal. Neck: No JVD, no thyromegaly. Lungs: Clear to auscultation bilaterally with normal respiratory effort. CV: Nondisplaced PMI.  Regular rate and rhythm, normal S1/S2, no S3/S4, no murmur. No pretibial or periankle edema.  No carotid bruit.  Normal pedal pulses.  Abdomen: Soft, nontender, no hepatosplenomegaly, no distention.  Neurologic: Alert and oriented x 3.  Psych: Normal affect. Skin: Normal. Musculoskeletal: Normal range of motion, no gross deformities. Extremities: No clubbing or cyanosis.   ECG: Most recent ECG reviewed.      ASSESSMENT AND PLAN: 1. Chest pain: Likely components of both musculoskeletal and gastrointestinal systems. His chest pain is markedly improved after his cholecystectomy. Continue Imdur 60 mg daily. 2. Nonobstructive CAD: At the present time, I will continue aspirin and Imdur 60 mg daily, along with simvastatin 40 mg daily as per ADA recommendations, in order to halt the progression of obstructive coronary artery disease. At the present time, I will not pursue nuclear stress testing. He does have documented coronary vasospasm by coronary angiography. I will increase amlodipine to 10 mg daily which will optimize blood pressure control as well as attenuate further coronary vasospasms. 3. Sleep apnea: Unable to tolerate CPAP. Being fitted for mouth spacers.  4. Essential HTN: It remains elevated. I had previously increased amlodipine to 10 mg but he is taking 5 mg. I will increase back to 10 mg. 5. Diabetes mellitus: Relatively well controlled with previous HbA1C of  6.8%.  6. GERD: Continue PPI and monitor diet. Symptomatic improvement after cholecystectomy.  Dispo: f/u 6 months.  Kate Sable, M.D., F.A.C.C.

## 2014-04-22 NOTE — Patient Instructions (Signed)
   Increase Norvasc to 10mg  daily - new sent to pharm Continue all other medications.   Your physician wants you to follow up in: 6 months.  You will receive a reminder letter in the mail one-two months in advance.  If you don't receive a letter, please call our office to schedule the follow up appointment

## 2014-04-22 NOTE — Addendum Note (Signed)
Addended by: Laurine Blazer on: 04/22/2014 04:11 PM   Modules accepted: Orders

## 2014-06-02 ENCOUNTER — Encounter (HOSPITAL_COMMUNITY): Payer: Self-pay | Admitting: Cardiovascular Disease

## 2014-06-14 ENCOUNTER — Encounter: Payer: Self-pay | Admitting: Internal Medicine

## 2014-06-14 ENCOUNTER — Ambulatory Visit: Payer: PRIVATE HEALTH INSURANCE | Admitting: Internal Medicine

## 2014-06-14 ENCOUNTER — Telehealth: Payer: Self-pay | Admitting: Internal Medicine

## 2014-06-14 NOTE — Telephone Encounter (Signed)
PATIENT WAS A NO SHOW AND LETTER SENT  °

## 2014-08-26 ENCOUNTER — Other Ambulatory Visit: Payer: Self-pay | Admitting: Cardiovascular Disease

## 2014-08-29 ENCOUNTER — Encounter: Payer: Self-pay | Admitting: Podiatry

## 2014-08-29 ENCOUNTER — Ambulatory Visit (INDEPENDENT_AMBULATORY_CARE_PROVIDER_SITE_OTHER): Payer: PRIVATE HEALTH INSURANCE

## 2014-08-29 ENCOUNTER — Ambulatory Visit (INDEPENDENT_AMBULATORY_CARE_PROVIDER_SITE_OTHER): Payer: PRIVATE HEALTH INSURANCE | Admitting: Podiatry

## 2014-08-29 VITALS — BP 164/88 | HR 75 | Resp 14 | Ht 76.0 in | Wt 312.0 lb

## 2014-08-29 DIAGNOSIS — M79673 Pain in unspecified foot: Secondary | ICD-10-CM

## 2014-08-29 DIAGNOSIS — M722 Plantar fascial fibromatosis: Secondary | ICD-10-CM

## 2014-08-29 NOTE — Patient Instructions (Signed)
If possible leave bandage on left foot 3-5 days When you remove the bandage where the fasciitis strap on the left foot Wear the hard orthotics as tolerated after remove the bandage. You can wear the fasciitis strap and hard orthotics at the same time  Plantar Fasciitis Plantar fasciitis is a common condition that causes foot pain. It is soreness (inflammation) of the band of tough fibrous tissue on the bottom of the foot that runs from the heel bone (calcaneus) to the ball of the foot. The cause of this soreness may be from excessive standing, poor fitting shoes, running on hard surfaces, being overweight, having an abnormal walk, or overuse (this is common in runners) of the painful foot or feet. It is also common in aerobic exercise dancers and ballet dancers. SYMPTOMS  Most people with plantar fasciitis complain of:  Severe pain in the morning on the bottom of their foot especially when taking the first steps out of bed. This pain recedes after a few minutes of walking.  Severe pain is experienced also during walking following a long period of inactivity.  Pain is worse when walking barefoot or up stairs DIAGNOSIS   Your caregiver will diagnose this condition by examining and feeling your foot.  Special tests such as X-rays of your foot, are usually not needed. PREVENTION   Consult a sports medicine professional before beginning a new exercise program.  Walking programs offer a good workout. With walking there is a lower chance of overuse injuries common to runners. There is less impact and less jarring of the joints.  Begin all new exercise programs slowly. If problems or pain develop, decrease the amount of time or distance until you are at a comfortable level.  Wear good shoes and replace them regularly.  Stretch your foot and the heel cords at the back of the ankle (Achilles tendon) both before and after exercise.  Run or exercise on even surfaces that are not hard. For  example, asphalt is better than pavement.  Do not run barefoot on hard surfaces.  If using a treadmill, vary the incline.  Do not continue to workout if you have foot or joint problems. Seek professional help if they do not improve. HOME CARE INSTRUCTIONS   Avoid activities that cause you pain until you recover.  Use ice or cold packs on the problem or painful areas after working out.  Only take over-the-counter or prescription medicines for pain, discomfort, or fever as directed by your caregiver.  Soft shoe inserts or athletic shoes with air or gel sole cushions may be helpful.  If problems continue or become more severe, consult a sports medicine caregiver or your own health care provider. Cortisone is a potent anti-inflammatory medication that may be injected into the painful area. You can discuss this treatment with your caregiver. MAKE SURE YOU:   Understand these instructions.  Will watch your condition.  Will get help right away if you are not doing well or get worse. Document Released: 03/05/2001 Document Revised: 09/02/2011 Document Reviewed: 05/04/2008 [facility] Patient Information 2015 [place], [place]. This information is not intended to replace advice given to you by your health care provider. Make sure you discuss any questions you have with your health care provider.  Diabetes and Foot Care Diabetes may cause you to have problems because of poor blood supply (circulation) to your feet and legs. This may cause the skin on your feet to become thinner, break easier, and heal more slowly. Your skin may become  dry, and the skin may peel and crack. You may also have nerve damage in your legs and feet causing decreased feeling in them. You may not notice minor injuries to your feet that could lead to infections or more serious problems. Taking care of your feet is one of the most important things you can do for yourself.  HOME CARE INSTRUCTIONS  Wear shoes at all times, even in  the house. Do not go barefoot. Bare feet are easily injured.  Check your feet daily for blisters, cuts, and redness. If you cannot see the bottom of your feet, use a mirror or ask someone for help.  Wash your feet with warm water (do not use hot water) and mild soap. Then pat your feet and the areas between your toes until they are completely dry. Do not soak your feet as this can dry your skin.  Apply a moisturizing lotion or petroleum jelly (that does not contain alcohol and is unscented) to the skin on your feet and to dry, brittle toenails. Do not apply lotion between your toes.  Trim your toenails straight across. Do not dig under them or around the cuticle. File the edges of your nails with an emery board or nail file.  Do not cut corns or calluses or try to remove them with medicine.  Wear clean socks or stockings every day. Make sure they are not too tight. Do not wear knee-high stockings since they may decrease blood flow to your legs.  Wear shoes that fit properly and have enough cushioning. To break in new shoes, wear them for just a few hours a day. This prevents you from injuring your feet. Always look in your shoes before you put them on to be sure there are no objects inside.  Do not cross your legs. This may decrease the blood flow to your feet.  If you find a minor scrape, cut, or break in the skin on your feet, keep it and the skin around it clean and dry. These areas may be cleansed with mild soap and water. Do not cleanse the area with peroxide, alcohol, or iodine.  When you remove an adhesive bandage, be sure not to damage the skin around it.  If you have a wound, look at it several times a day to make sure it is healing.  Do not use heating pads or hot water bottles. They may burn your skin. If you have lost feeling in your feet or legs, you may not know it is happening until it is too late.  Make sure your health care provider performs a complete foot exam at least  annually or more often if you have foot problems. Report any cuts, sores, or bruises to your health care provider immediately. SEEK MEDICAL CARE IF:   You have an injury that is not healing.  You have cuts or breaks in the skin.  You have an ingrown nail.  You notice redness on your legs or feet.  You feel burning or tingling in your legs or feet.  You have pain or cramps in your legs and feet.  Your legs or feet are numb.  Your feet always feel cold. SEEK IMMEDIATE MEDICAL CARE IF:   There is increasing redness, swelling, or pain in or around a wound.  There is a red line that goes up your leg.  Pus is coming from a wound.  You develop a fever or as directed by your health care provider.  You notice  a bad smell coming from an ulcer or wound. Document Released: 06/07/2000 Document Revised: 02/10/2013 Document Reviewed: 11/17/2012 [facility] Patient Information 2015 [place], [place]. This information is not intended to replace advice given to you by your health care provider. Make sure you discuss any questions you have with your health care provider.

## 2014-08-29 NOTE — Progress Notes (Signed)
   Subjective:    Patient ID: Jerome Shepherd, male    DOB: August 12, 1965, 49 y.o.   MRN: [id number]  HPI Comments: N heel pain L left > right heel and plantar foot D December 2015 O pt states had begun a exercise program and had to quit due to gallbladder surgery C sharp pain A walking and after resting [place] rx orthotics on and off  Pt complains of tiredness of the B/L calf and hx of discoloration to medial lower left calf, 6 - 8 weeks.  Patient denies history of foot ulceration or claudication Patient complains of some calf discomfort with walking cast relieved with rest and admits that he limps when he walks Patient has history of previous fasciitis and has rigid orthotics approximately 51 years old from previous episodes   Review of Systems  All other systems reviewed and are negative.      Objective:   Physical Exam   Orientated 3  Vascular: DP pulses 2/4 bilaterally PT pulses 2/bilaterally Capillary reflex immediate bilaterally No calf edema noted bilaterally No calf tenderness to palpation with foot maximally dorsiflexed bilaterally No calf warmth noted bilaterally  Neurological: Ankle reflex equal and reactive bilaterally Vibratory sensation intact bilaterally Sensation to 10 g monofilament wire intact 5/5 bilaterally  Dermatological: Texture and turgor within normal limits bilaterally Diffuse callus second to fourth MPJ bilaterally No foot ulcerations noted bilaterally  Musculoskeletal: Mild palpable tenderness central inferior right heel without any palpable lesions the fat pad is adequate Exquisite palpable tenderness medial central lateral left inferior heel without any palpable lesions the fat pad is adequate There is no tenderness to medial lateral compression of the right and left calcaneus No restriction ankle, subtalar, midtarsal joints bilaterally   X-ray examination weightbearing right foot  Intact bony structure without fracture  and/or dislocation Normal bone density Posterior and inferior calcaneal spurs Hammertoe deformity second   Radiographic impression: No acute bony abnormality noted in the right foot  X-ray examination weightbearing left foot  Intact bony structure without fracture and/or dislocation Normal bone density Posterior and inferior calcaneal spurs Hammertoe deformity second Normal joint spaces  Radiographic impression: No acute bony abnormality noted left foot     Assessment & Plan:    Assessment: Satisfactory neurovascular status No evidence of DVT lower extremities bilaterally Altered gait resulting in calf soreness with walking Bilateral fasciitis left more symptomatic than right  Plan: Discuss findings of the examination today with patient Offered patient Kenalog injection inferior heel left and he verbally consents Skin is prepped with alcohol and Betadine and 10 mg of Kenalog mixed with 10 mg of plain Xylocaine and 2.5 mg of plain Marcaine were injected inferior heel left for Kenalog injection #1. Patient tolerated injections without any difficulty.  Fascial taping applied to the left foot with instructions to leave on for 3-5 days Fasciitis strap dispensed to wear on the left foot after removal fascial taping Discussed shoeing and stretching Wear existing rigid orthotics with fasciitis strap only if comfortable  Reappoint 4 weeks

## 2014-08-30 DIAGNOSIS — M722 Plantar fascial fibromatosis: Secondary | ICD-10-CM | POA: Diagnosis not present

## 2014-08-30 MED ORDER — TRIAMCINOLONE ACETONIDE 10 MG/ML IJ SUSP
10.0000 mg | Freq: Once | INTRAMUSCULAR | Status: AC
  Administered 2014-08-30: 10 mg

## 2014-09-16 ENCOUNTER — Other Ambulatory Visit: Payer: Self-pay | Admitting: *Deleted

## 2014-09-16 ENCOUNTER — Other Ambulatory Visit: Payer: Self-pay | Admitting: Cardiovascular Disease

## 2014-09-16 MED ORDER — AMLODIPINE BESYLATE 10 MG PO TABS: 10.0000 mg | ORAL_TABLET | Freq: Every day | ORAL | Status: DC

## 2014-09-26 ENCOUNTER — Encounter: Payer: Self-pay | Admitting: Podiatry

## 2014-09-26 ENCOUNTER — Ambulatory Visit (INDEPENDENT_AMBULATORY_CARE_PROVIDER_SITE_OTHER): Payer: PRIVATE HEALTH INSURANCE | Admitting: Podiatry

## 2014-09-26 VITALS — BP 167/94 | HR 72 | Resp 12

## 2014-09-26 DIAGNOSIS — M722 Plantar fascial fibromatosis: Secondary | ICD-10-CM

## 2014-09-26 NOTE — Patient Instructions (Signed)
Contact your gastroenterologist and ask him what over-the-counter medications you can take in regards to your cirrhosis of the liver Wear  Orthotics]  wear plantar fasciitis strap left  Bent - Knee Calf Stretch  1) Stand an arm's length away from a wall. Place the palms of your hands on the wall. Step forward about 12 inches with the opposite foot.  2) Keeping toes pointed forward and both heels on the floor, bend both knees and lean forward. Hold this position for 60 seconds. Don't arch your back and don't hunch your shoulders.  3) Repeat this twice.  DO THIS STRETCHING TECHNIQUE 3 TIMES A DAY.   Stretching Exercises before Standing  Pull your toes up toward your nose and hold for 1 minute before standing.  A towel can assist with this exercise if you put the towel under the ball of your foot. This exercise reduces the intense   pain associated when changing from a seated to a standing position. This stretch can usually be the most beneficial if done before getting out of bed in the mornings. Plantar Fasciitis Plantar fasciitis is a common condition that causes foot pain. It is soreness (inflammation) of the band of tough fibrous tissue on the bottom of the foot that runs from the heel bone (calcaneus) to the ball of the foot. The cause of this soreness may be from excessive standing, poor fitting shoes, running on hard surfaces, being overweight, having an abnormal walk, or overuse (this is common in runners) of the painful foot or feet. It is also common in aerobic exercise dancers and ballet dancers. SYMPTOMS  Most people with plantar fasciitis complain of:  Severe pain in the morning on the bottom of their foot especially when taking the first steps out of bed. This pain recedes after a few minutes of walking.  Severe pain is experienced also during walking following a long period of inactivity.  Pain is worse when walking barefoot or up stairs DIAGNOSIS   Your caregiver will  diagnose this condition by examining and feeling your foot.  Special tests such as X-rays of your foot, are usually not needed. PREVENTION   Consult a sports medicine professional before beginning a new exercise program.  Walking programs offer a good workout. With walking there is a lower chance of overuse injuries common to runners. There is less impact and less jarring of the joints.  Begin all new exercise programs slowly. If problems or pain develop, decrease the amount of time or distance until you are at a comfortable level.  Wear good shoes and replace them regularly.  Stretch your foot and the heel cords at the back of the ankle (Achilles tendon) both before and after exercise.  Run or exercise on even surfaces that are not hard. For example, asphalt is better than pavement.  Do not run barefoot on hard surfaces.  If using a treadmill, vary the incline.  Do not continue to workout if you have foot or joint problems. Seek professional help if they do not improve. HOME CARE INSTRUCTIONS   Avoid activities that cause you pain until you recover.  Use ice or cold packs on the problem or painful areas after working out.  Only take over-the-counter or prescription medicines for pain, discomfort, or fever as directed by your caregiver.  Soft shoe inserts or athletic shoes with air or gel sole cushions may be helpful.  If problems continue or become more severe, consult a sports medicine caregiver or your own health  care provider. Cortisone is a potent anti-inflammatory medication that may be injected into the painful area. You can discuss this treatment with your caregiver. MAKE SURE YOU:   Understand these instructions.  Will watch your condition.  Will get help right away if you are not doing well or get worse. Document Released: 03/05/2001 Document Revised: 09/02/2011 Document Reviewed: 05/04/2008 [facility] Patient Information 2015 [place], [place]. This information is not  intended to replace advice given to you by your health care provider. Make sure you discuss any questions you have with your health care provider.

## 2014-09-27 DIAGNOSIS — M722 Plantar fascial fibromatosis: Secondary | ICD-10-CM

## 2014-09-27 MED ORDER — TRIAMCINOLONE ACETONIDE 10 MG/ML IJ SUSP
10.0000 mg | Freq: Once | INTRAMUSCULAR | Status: AC
  Administered 2014-09-27: 10 mg

## 2014-09-27 NOTE — Progress Notes (Signed)
Patient ID: Jerome Shepherd, male   DOB: December 14, 1965, 49 y.o.   MRN: [id number]  Subjective: This patient presents again complaining of bilateral fasciitis left more symptomatic than the right. He is currently wearing custom foot orthotics and a plantar fasciitis strap. He said one Kenalog Injection inferior left heel which provides some temporary reduction of pain. 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 He verbally consents. The foot is prepped with alcohol and Betadine and 10 mg of Kenalog mixed with 10 mg of plain Xylocaine and 2.5 mg of plain Marcaine were injected inferior heel left for Kenalog injection #2. Patient tolerated injection without any difficulty Patient was wearing fasciitis strap incorrectly and demonstrated how to wear the fasciitis strap Continue wearing custom foot orthotics Continue stretching Advised patient to discuss with treating Dr. 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  Reappoint 6 weeks

## 2014-10-05 ENCOUNTER — Other Ambulatory Visit: Payer: Self-pay | Admitting: Adult Health

## 2014-11-07 ENCOUNTER — Ambulatory Visit: Payer: PRIVATE HEALTH INSURANCE | Admitting: Podiatry

## 2014-11-14 ENCOUNTER — Ambulatory Visit (INDEPENDENT_AMBULATORY_CARE_PROVIDER_SITE_OTHER): Payer: PRIVATE HEALTH INSURANCE | Admitting: Podiatry

## 2014-11-14 ENCOUNTER — Encounter: Payer: Self-pay | Admitting: Podiatry

## 2014-11-14 VITALS — BP 149/79 | HR 78 | Resp 12

## 2014-11-14 DIAGNOSIS — M722 Plantar fascial fibromatosis: Secondary | ICD-10-CM | POA: Diagnosis not present

## 2014-11-14 MED ORDER — TRIAMCINOLONE ACETONIDE 10 MG/ML IJ SUSP
10.0000 mg | Freq: Once | INTRAMUSCULAR | Status: AC
Start: 2014-11-14 — End: 2014-11-14
  Administered 2014-11-14: 10 mg

## 2014-11-14 NOTE — Patient Instructions (Addendum)
Wear othotis as long as comfortable & remove if uncortable Use night splint at home   Bent - Knee Calf Stretch        1) Stand an arm's length away from a wall. Place the palms of your hands on the wall. Step forward about 12 inches with the opposite foot.  2) Keeping toes pointed forward and both heels on the floor, bend both knees and lean forward. Hold this position for 60 seconds. Don't arch your back and don't hunch your shoulders.  3) Repeat this twice.  DO THIS STRETCHING TECHNIQUE 3 TIMES A DAY.   Stretching Exercises before Standing  Pull your toes up toward your nose and hold for 1 minute before standing.  A towel can assist with this exercise if you put the towel under the ball of your foot. This exercise reduces the intense   pain associated when changing from a seated to a standing position. This stretch can usually be the most beneficial if done before getting out of bed in the mornings.

## 2014-11-15 NOTE — Progress Notes (Signed)
Patient ID: Jerome Shepherd, male   DOB: 07-Jun-1966, 49 y.o.   MRN: [id number]  Subjective: This patient presents today complaining of plantar fasciitis left greater than right.  2 Kenalog injections into the inferior heel left, both of which provided several weeks of relief. He is currently wearing rigid functional orthotics, however, when the plantar fasciitis exacerbates the orthotics become uncomfortable left greater than right. He is also wearing a plantar fasciitis strap on the left.  Medical history includes cirrhosis contraindicating NSAIDs Type II diabetic  Objective: No edema noted bilaterally No skin lesions noted bilaterally Palpable tenderness medial plantar fascial insertional area left greater than right without any palpable lesions  Assessment: Bilateral fasciitis left greater than right  Plan: We discussed treatment options including:  further immobilization with boot Weight reduction EPAT therapy Plantar fasciotomy  Offered patient additional Kenalog injection or plantar fasciitis left and he verbally consents. Skin is prepped with alcohol and Betadine and 10 mg of Kenalog mixed with 10 mg of plain Xylocaine and 2.5 mg of plain Marcaine injected inferior heel left for Kenalog injection #3  Night splint dispensed 1 to wear on left Continue wearing plantar fasciitis left Wear rigid orthotics as longus are comfortable and remove when uncomfortable Maintain bent knee stretching  Reappoint when necessary at patient's request

## 2014-11-16 ENCOUNTER — Telehealth: Payer: Self-pay

## 2014-11-16 NOTE — Telephone Encounter (Signed)
Pt called- he is seeing a podiatrist for plantar fasciitis. They dont know what to give him for inflammation d/t his cirrhosis, and they are requesting input from you as to what he can take.  Pt is seeing Dr.Tuchman and his ov note is in epic.

## 2014-11-16 NOTE — Telephone Encounter (Signed)
Pt is aware of recommendations

## 2014-11-16 NOTE — Telephone Encounter (Signed)
May use acetaminophen-no more than 2 g daily assuming 0, concomitant alcohol ingestion.  Could also consider over-the-counter NSAIDs not to exceed OTC dosing guidelines.

## 2014-12-15 ENCOUNTER — Other Ambulatory Visit: Payer: Self-pay | Admitting: Cardiovascular Disease

## 2015-01-17 ENCOUNTER — Other Ambulatory Visit: Payer: Self-pay | Admitting: Cardiovascular Disease

## 2015-01-18 ENCOUNTER — Encounter: Payer: Self-pay | Admitting: *Deleted

## 2015-01-18 ENCOUNTER — Encounter: Payer: Self-pay | Admitting: Cardiovascular Disease

## 2015-01-18 ENCOUNTER — Ambulatory Visit (INDEPENDENT_AMBULATORY_CARE_PROVIDER_SITE_OTHER): Payer: No Typology Code available for payment source | Admitting: Cardiovascular Disease

## 2015-01-18 VITALS — BP 152/80 | HR 81 | Ht 76.0 in | Wt 319.0 lb

## 2015-01-18 DIAGNOSIS — I10 Essential (primary) hypertension: Secondary | ICD-10-CM | POA: Diagnosis not present

## 2015-01-18 DIAGNOSIS — R072 Precordial pain: Secondary | ICD-10-CM | POA: Diagnosis not present

## 2015-01-18 DIAGNOSIS — E119 Type 2 diabetes mellitus without complications: Secondary | ICD-10-CM

## 2015-01-18 DIAGNOSIS — G4730 Sleep apnea, unspecified: Secondary | ICD-10-CM

## 2015-01-18 DIAGNOSIS — E669 Obesity, unspecified: Secondary | ICD-10-CM

## 2015-01-18 DIAGNOSIS — I2583 Coronary atherosclerosis due to lipid rich plaque: Secondary | ICD-10-CM

## 2015-01-18 DIAGNOSIS — I2510 Atherosclerotic heart disease of native coronary artery without angina pectoris: Secondary | ICD-10-CM

## 2015-01-18 DIAGNOSIS — R5383 Other fatigue: Secondary | ICD-10-CM

## 2015-01-18 DIAGNOSIS — R0609 Other forms of dyspnea: Secondary | ICD-10-CM

## 2015-01-18 DIAGNOSIS — I201 Angina pectoris with documented spasm: Secondary | ICD-10-CM

## 2015-01-18 DIAGNOSIS — N539 Unspecified male sexual dysfunction: Secondary | ICD-10-CM

## 2015-01-18 MED ORDER — AZILSARTAN MEDOXOMIL 40 MG PO TABS: 40.0000 mg | ORAL_TABLET | Freq: Every day | ORAL | Status: DC

## 2015-01-18 NOTE — Patient Instructions (Signed)
Your physician wants you to follow-up in: 6 months with Dr. Bronson Ing.  You will receive a reminder letter in the mail two months in advance. If you don't receive a letter, please call our office to schedule the follow-up appointment.  Your physician has recommended you make the following change in your medication:   Start Edarbi 40 mg Daily  Your physician has requested that you have a lexiscan myoview. For further information please visit [email or web address]. Please follow instruction sheet, as given.   Thank you for choosing [facility]!
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
## 2015-02-17 ENCOUNTER — Encounter (HOSPITAL_COMMUNITY): Payer: Self-pay

## 2015-02-17 ENCOUNTER — Encounter (HOSPITAL_COMMUNITY)
Admission: RE | Admit: 2015-02-17 | Discharge: 2015-02-17 | Disposition: A | Discharge status: Home / Self Care | Payer: No Typology Code available for payment source | Source: Ambulatory Visit | Attending: Cardiovascular Disease | Admitting: Cardiovascular Disease

## 2015-02-17 ENCOUNTER — Telehealth: Payer: Self-pay | Admitting: Cardiovascular Disease

## 2015-02-17 ENCOUNTER — Inpatient Hospital Stay (HOSPITAL_COMMUNITY): Admission: RE | Admit: 2015-02-17 | Payer: No Typology Code available for payment source | Source: Ambulatory Visit

## 2015-02-17 DIAGNOSIS — R072 Precordial pain: Secondary | ICD-10-CM | POA: Diagnosis not present

## 2015-02-17 LAB — NM MYOCAR MULTI W/SPECT W/WALL MOTION / EF
CHL CUP NUCLEAR SSS: 1
CHL CUP RESTING HR STRESS: 77 {beats}/min
CSEPPHR: 103 {beats}/min
LV dias vol: 94 mL
LVSYSVOL: 43 mL
NUC STRESS TID: 1.01
RATE: 0.19
SDS: 1
SRS: 0

## 2015-02-17 MED ORDER — SODIUM CHLORIDE 0.9 % IJ SOLN
INTRAMUSCULAR | Status: AC
  Administered 2015-02-17: 10 mL via INTRAVENOUS
  Filled 2015-02-17: qty 3

## 2015-02-17 MED ORDER — REGADENOSON 0.4 MG/5ML IV SOLN
INTRAVENOUS | Status: AC
  Administered 2015-02-17: 0.4 mg via INTRAVENOUS
  Filled 2015-02-17: qty 5

## 2015-02-17 MED ORDER — TECHNETIUM TC 99M SESTAMIBI GENERIC - CARDIOLITE
30.0000 | Freq: Once | INTRAVENOUS | Status: AC | PRN
  Administered 2015-02-17: 30 via INTRAVENOUS

## 2015-02-17 MED ORDER — TECHNETIUM TC 99M SESTAMIBI - CARDIOLITE
10.0000 | Freq: Once | INTRAVENOUS | Status: AC | PRN
  Administered 2015-02-17: 10:00:00 9 via INTRAVENOUS

## 2015-02-17 NOTE — Telephone Encounter (Signed)
Returned someone's call

## 2015-03-03 ENCOUNTER — Telehealth: Payer: Self-pay | Admitting: *Deleted

## 2015-03-03 NOTE — Telephone Encounter (Signed)
Error

## 2015-05-12 ENCOUNTER — Other Ambulatory Visit: Payer: Self-pay | Admitting: Cardiovascular Disease

## 2015-05-28 ENCOUNTER — Other Ambulatory Visit: Payer: Self-pay | Admitting: Cardiovascular Disease

## 2015-07-17 ENCOUNTER — Other Ambulatory Visit: Payer: Self-pay | Admitting: Cardiovascular Disease

## 2015-07-18 ENCOUNTER — Encounter: Payer: Self-pay | Admitting: Cardiovascular Disease

## 2015-07-18 ENCOUNTER — Ambulatory Visit (INDEPENDENT_AMBULATORY_CARE_PROVIDER_SITE_OTHER): Payer: No Typology Code available for payment source | Admitting: Cardiovascular Disease

## 2015-07-18 VITALS — BP 148/84 | HR 88 | Ht 76.0 in | Wt 315.0 lb

## 2015-07-18 DIAGNOSIS — I2583 Coronary atherosclerosis due to lipid rich plaque: Secondary | ICD-10-CM

## 2015-07-18 DIAGNOSIS — K219 Gastro-esophageal reflux disease without esophagitis: Secondary | ICD-10-CM

## 2015-07-18 DIAGNOSIS — I2510 Atherosclerotic heart disease of native coronary artery without angina pectoris: Secondary | ICD-10-CM | POA: Diagnosis not present

## 2015-07-18 DIAGNOSIS — G4730 Sleep apnea, unspecified: Secondary | ICD-10-CM | POA: Diagnosis not present

## 2015-07-18 DIAGNOSIS — I10 Essential (primary) hypertension: Secondary | ICD-10-CM

## 2015-07-18 DIAGNOSIS — I201 Angina pectoris with documented spasm: Secondary | ICD-10-CM

## 2015-07-18 DIAGNOSIS — R072 Precordial pain: Secondary | ICD-10-CM | POA: Diagnosis not present

## 2015-07-18 DIAGNOSIS — R0609 Other forms of dyspnea: Secondary | ICD-10-CM

## 2015-07-18 MED ORDER — NITROGLYCERIN 0.4 MG SL SUBL: 0.4000 mg | SUBLINGUAL_TABLET | SUBLINGUAL | Status: DC | PRN

## 2015-07-18 MED ORDER — AMLODIPINE BESYLATE 10 MG PO TABS: 10.0000 mg | ORAL_TABLET | Freq: Every day | ORAL | Status: DC

## 2015-07-18 MED ORDER — ISOSORBIDE MONONITRATE ER 60 MG PO TB24: 60.0000 mg | ORAL_TABLET | Freq: Every day | ORAL | Status: DC

## 2015-07-18 NOTE — Progress Notes (Signed)
Patient ID: Jerome Shepherd, male   DOB: 1966-06-18, 50 y.o.   MRN: [id number]      SUBJECTIVE: The patient is here to followup for chest pain. In summary, he is a 50 year old male with a past medical history of nonobstructive coronary artery disease, diabetes mellitus, and hypertension. He underwent cardiac catheterization for possible MI in February 2013 which revealed coronary vasospasm. He has a known history of gastroesophageal reflux disease. He is also noted to have esophageal spasm and follows with GI.  He underwent a low risk nuclear stress test on 02/17/15, LVEF 55%. There appeared to be variable soft tissue attenuation artifact.  He has changed his diet considerably and eats a lot of salads. He dropped his HbA1c from over 10% to 6.1%. He has lost at least 10 pounds. He has not had to use any sublingual nitroglycerin. He may experience some mild chest tightness with heavy exertion. He wants to begin walking again when his left plantar fasciitis heals a bit more.   Review of Systems: As per "subjective", otherwise negative.  Allergies  Allergen Reactions  . Codeine Nausea Only  . Sulfa Antibiotics Nausea Only    Current Outpatient Prescriptions  Medication Sig Dispense Refill  . amLODipine (NORVASC) 10 MG tablet Take 1 tablet (10 mg total) by mouth daily. 30 tablet 6  . EDARBI 40 MG TABS TAKE ONE TABLET BY MOUTH DAILY. 30 tablet 6  . glyBURIDE micronized (GLYNASE) 6 MG tablet Take 6 mg by mouth daily with breakfast.     . ibuprofen (ADVIL,MOTRIN) 200 MG tablet Take 200 mg by mouth 2 (two) times daily as needed for moderate pain.    . isosorbide mononitrate (IMDUR) 60 MG 24 hr tablet TAKE 1 TABLET BY MOUTH EVERY DAY 30 tablet 1  . Magnesium Gluconate 27.5 MG TABS Take 1 tablet by mouth as needed. cramps    . metFORMIN (GLUCOPHAGE) 500 MG tablet Take 500 mg by mouth daily with breakfast.     . nitroGLYCERIN (NITROSTAT) 0.4 MG SL tablet Place 1 tablet (0.4 mg total) under the tongue  every 5 (five) minutes as needed for chest pain. 25 tablet 4  . pantoprazole (PROTONIX) 40 MG tablet Take 1 tablet (40 mg total) by mouth 2 (two) times daily. 60 tablet 0  . simvastatin (ZOCOR) 40 MG tablet Take 40 mg by mouth every evening.     No current facility-administered medications for this visit.    Past Medical History  Diagnosis Date  . Diabetes mellitus   . Sleep apnea   . CAD (coronary artery disease), minimal, non obstructive 2013 01/01/2013  . Hypertension   . Constipation   . Myocardial infarction ([place])   . Antral erosion 01/17/2014    Per EGD 01/10/14- not likely the cause of his symtoms   . Coronary artery spasm ([place]) 2013    Past Surgical History  Procedure Laterality Date  . Esophagogastroduodenoscopy N/A 01/10/2014    Dr. Gala Romney: antral erosions, path with chronic inactive gastritis. Empiric dilation with 56-F dilation  . Savory dilation N/A 01/10/2014    Procedure: SAVORY DILATION;  Surgeon: Daneil Dolin, MD;  [facility];  Service: Endoscopy;  Laterality: N/A;  Venia Minks dilation N/A 01/10/2014    Procedure: Venia Minks DILATION;  Surgeon: Daneil Dolin, MD;  [facility]: [facility];  Service: Endoscopy;  Laterality: N/A;  . Cardiac catheterization  2013    non obstructive CAD  ~20%  . Knee arthroscopy Right 2002  . Cholecystectomy  N/A 04/01/2014    Procedure: LAPAROSCOPIC CHOLECYSTECTOMY;  Surgeon: Jamesetta So, MD;  Location: [facility];  Service: General;  Laterality: N/A;  . Liver biopsy N/A 04/01/2014    mild fibrosis  . Left heart cath N/A 08/18/2011    Procedure: LEFT HEART CATH;  Surgeon: Lorretta Harp, MD;  Location: [facility] CATH LAB;  Service: Cardiovascular;  Laterality: N/A;    Social History   Social History  . Marital Status: Divorced    Spouse Name: N/A  . Number of Children: N/A  . Years of Education: N/A   Occupational History  . truck driver    Social History Main Topics  . Smoking status: Former Smoker    Quit date: 08/18/1995   . Smokeless tobacco: Never Used     Comment: Quit smoking x 20 years  . Alcohol Use: No  . Drug Use: No  . Sexual Activity: Not Currently   Other Topics Concern  . Not on file   Social History Narrative   Divorced, no children, works as a Production designer, theatre/television/film.     Filed Vitals:   07/18/15 1430  BP: 148/84  Pulse: 88  Height: 6' 4"  (1.93 m)  Weight: 315 lb (142.883 kg)  SpO2: 98%    PHYSICAL EXAM General: NAD HEENT: Normal. Neck: No JVD, no thyromegaly. Lungs: Clear to auscultation bilaterally with normal respiratory effort. CV: Nondisplaced PMI. Regular rate and rhythm, normal S1/S2, no S3/S4, no murmur. No pretibial edema. No carotid bruit.  Abdomen: Firm, obese. Neurologic: Alert and oriented x 3.  Psych: Normal affect. Skin: Normal. Musculoskeletal: No gross deformities. Extremities: No clubbing or cyanosis.   ECG: Most recent ECG reviewed.      ASSESSMENT AND PLAN: 1. Chest pain/SOB/fatigue: Low risk Lexiscan Cardiolite stress test in 01/2015. Continue Imdur 60 mg daily. Symptomatically stable.  2. Nonobstructive CAD: Symptomatically stable. Continue aspirin and Imdur 60 mg daily, along with simvastatin 40 mg daily as per ADA recommendations, in order to halt the progression of obstructive coronary artery disease. He does have documented coronary vasospasm by coronary angiography. I will continue amlodipine 10 mg daily which will control blood pressure control as well as hopefully attenuate further coronary vasospasms.  3. Sleep apnea: Using mouth spacers.   4. Essential HTN: It remains elevated but he just got in an argument with family member. Will monitor. Continue Edarbi 40 mg.  Dispo: f/u 1.5 years.  Kate Sable, M.D., F.A.C.C.

## 2015-07-18 NOTE — Patient Instructions (Signed)
Your physician wants you to follow-up in: 1 1/2 year with Dr. Virgina Jock will receive a reminder letter in the mail two months in advance. If you don't receive a letter, please call our office to schedule the follow-up appointment.  Your physician recommends that you continue on your current medications as directed. Please refer to the Current Medication list given to you today.  Thank you for choosing [facility]!!

## 2015-08-19 ENCOUNTER — Other Ambulatory Visit: Payer: Self-pay | Admitting: Cardiovascular Disease

## 2015-11-23 ENCOUNTER — Ambulatory Visit (INDEPENDENT_AMBULATORY_CARE_PROVIDER_SITE_OTHER): Payer: No Typology Code available for payment source | Admitting: Cardiovascular Disease

## 2015-11-23 ENCOUNTER — Encounter: Payer: Self-pay | Admitting: Cardiovascular Disease

## 2015-11-23 VITALS — BP 122/78 | HR 98 | Ht 76.0 in | Wt 323.0 lb

## 2015-11-23 DIAGNOSIS — F4321 Adjustment disorder with depressed mood: Secondary | ICD-10-CM

## 2015-11-23 DIAGNOSIS — I201 Angina pectoris with documented spasm: Secondary | ICD-10-CM

## 2015-11-23 DIAGNOSIS — I2510 Atherosclerotic heart disease of native coronary artery without angina pectoris: Secondary | ICD-10-CM | POA: Diagnosis not present

## 2015-11-23 DIAGNOSIS — E669 Obesity, unspecified: Secondary | ICD-10-CM

## 2015-11-23 DIAGNOSIS — G4730 Sleep apnea, unspecified: Secondary | ICD-10-CM | POA: Diagnosis not present

## 2015-11-23 DIAGNOSIS — R5383 Other fatigue: Secondary | ICD-10-CM

## 2015-11-23 DIAGNOSIS — R079 Chest pain, unspecified: Secondary | ICD-10-CM

## 2015-11-23 DIAGNOSIS — I10 Essential (primary) hypertension: Secondary | ICD-10-CM

## 2015-11-23 DIAGNOSIS — I2583 Coronary atherosclerosis due to lipid rich plaque: Secondary | ICD-10-CM

## 2015-11-23 MED ORDER — AZILSARTAN MEDOXOMIL 40 MG PO TABS: 1.0000 | ORAL_TABLET | Freq: Every day | ORAL | Status: DC

## 2015-11-23 MED ORDER — ASPIRIN EC 81 MG PO TBEC: 81.0000 mg | DELAYED_RELEASE_TABLET | Freq: Every day | ORAL | Status: AC

## 2015-11-23 NOTE — Addendum Note (Signed)
Addended by: Laurine Blazer on: 11/23/2015 04:34 PM   Modules accepted: Orders

## 2015-11-23 NOTE — Progress Notes (Signed)
Patient ID: Jerome Shepherd, male   DOB: December 05, 1965, 50 y.o.   MRN: [id number]      SUBJECTIVE: The patient presents prematurely for follow-up with a history of chest pain and nonobstructive coronary artery disease. When I evaluated him on1/24/17, he was doing well.  He recently lost his mother at age 11 and his sister at age 65 within 62 weeks of each other. His sister died of a sudden heart attack. He has been dealing with the estate and talking to an attorney and has a lot of stress with back and shoulder tightness, occasional chest discomfort, and an overall sensation of fatigue.  He spoke at length about his anxiety and stress and requested treatment for this.  Review of Systems: As per "subjective", otherwise negative.  Allergies  Allergen Reactions  . Codeine Nausea Only  . Sulfa Antibiotics Nausea Only    Current Outpatient Prescriptions  Medication Sig Dispense Refill  . amLODipine (NORVASC) 10 MG tablet Take 1 tablet (10 mg total) by mouth daily. 30 tablet 6  . EDARBI 40 MG TABS TAKE ONE TABLET BY MOUTH DAILY. 30 tablet 6  . glyBURIDE micronized (GLYNASE) 6 MG tablet Take 6 mg by mouth daily with breakfast.     . ibuprofen (ADVIL,MOTRIN) 200 MG tablet Take 200 mg by mouth 2 (two) times daily as needed for moderate pain.    . isosorbide mononitrate (IMDUR) 60 MG 24 hr tablet Take 1 tablet (60 mg total) by mouth daily. 30 tablet 6  . Magnesium Gluconate 27.5 MG TABS Take 1 tablet by mouth as needed. cramps    . metFORMIN (GLUCOPHAGE) 500 MG tablet Take 500 mg by mouth daily with breakfast.     . nitroGLYCERIN (NITROSTAT) 0.4 MG SL tablet Place 1 tablet (0.4 mg total) under the tongue every 5 (five) minutes as needed for chest pain. 25 tablet 3  . pantoprazole (PROTONIX) 40 MG tablet Take 1 tablet (40 mg total) by mouth 2 (two) times daily. 60 tablet 0  . simvastatin (ZOCOR) 40 MG tablet Take 40 mg by mouth every evening.     No current facility-administered medications for this  visit.    Past Medical History  Diagnosis Date  . Diabetes mellitus   . Sleep apnea   . CAD (coronary artery disease), minimal, non obstructive 2013 01/01/2013  . Hypertension   . Constipation   . Myocardial infarction ([place])   . Antral erosion 01/17/2014    Per EGD 01/10/14- not likely the cause of his symtoms   . Coronary artery spasm ([place]) 2013    Past Surgical History  Procedure Laterality Date  . Esophagogastroduodenoscopy N/A 01/10/2014    Dr. Gala Romney: antral erosions, path with chronic inactive gastritis. Empiric dilation with 56-F dilation  . Savory dilation N/A 01/10/2014    Procedure: SAVORY DILATION;  Surgeon: Daneil Dolin, MD;  [facility];  Service: Endoscopy;  Laterality: N/A;  Venia Minks dilation N/A 01/10/2014    Procedure: Venia Minks DILATION;  Surgeon: Daneil Dolin, MD;  [facility]: [facility];  Service: Endoscopy;  Laterality: N/A;  . Cardiac catheterization  2013    non obstructive CAD  ~20%  . Knee arthroscopy Right 2002  . Cholecystectomy N/A 04/01/2014    Procedure: LAPAROSCOPIC CHOLECYSTECTOMY;  Surgeon: Jamesetta So, MD;  Location: [facility];  Service: General;  Laterality: N/A;  . Liver biopsy N/A 04/01/2014    mild fibrosis  . Left heart cath N/A 08/18/2011    Procedure: LEFT  HEART CATH;  Surgeon: Lorretta Harp, MD;  Location: [facility] CATH LAB;  Service: Cardiovascular;  Laterality: N/A;    Social History   Social History  . Marital Status: Divorced    Spouse Name: N/A  . Number of Children: N/A  . Years of Education: N/A   Occupational History  . truck driver    Social History Main Topics  . Smoking status: Former Smoker    Quit date: 08/18/1995  . Smokeless tobacco: Never Used     Comment: Quit smoking x 20 years  . Alcohol Use: No  . Drug Use: No  . Sexual Activity: Not Currently   Other Topics Concern  . Not on file   Social History Narrative   Divorced, no children, works as a Production designer, theatre/television/film.     Filed Vitals:    11/23/15 1355  BP: 122/78  Pulse: 98  Height: 6' 4"  (1.93 m)  Weight: 323 lb (146.512 kg)  SpO2: 98%    PHYSICAL EXAM General: NAD HEENT: Normal. Neck: No JVD, no thyromegaly. Lungs: Clear to auscultation bilaterally with normal respiratory effort. CV: Nondisplaced PMI. Regular rate and rhythm, normal S1/S2, no S3/S4, no murmur. No pretibial edema. No carotid bruit.  Abdomen: Firm, obese. Neurologic: Alert and oriented x 3.  Psych: Normal affect. Skin: Normal. Musculoskeletal: No gross deformities. Extremities: No clubbing or cyanosis.    ECG: Most recent ECG reviewed.      ASSESSMENT AND PLAN: 1. Chest pain/SOB/fatigue: Low risk Lexiscan Cardiolite stress test in 01/2015. Continue Imdur 60 mg daily and amlodipine 10 mg. Symptomatically stable. Current symptoms due to acute grief reaction.  2. Nonobstructive CAD: Symptomatically stable. Continue aspirin and Imdur 60 mg daily, along with simvastatin 40 mg daily as per ADA recommendations, in order to halt the progression of obstructive coronary artery disease. He does have documented coronary vasospasm by coronary angiography. I will continue amlodipine 10 mg daily which will control blood pressure control as well as hopefully attenuate further coronary vasospasm. I will start ASA 81 mg daily.  3. Sleep apnea: Using mouth spacers.   4. Essential HTN: Controlled. Continue Edarbi 40 mg.  5. Acute grief reaction: I have recommended behavioral therapy. He will consider this.  Dispo: f/u 1 year.   Kate Sable, M.D., F.A.C.C.

## 2015-11-23 NOTE — Patient Instructions (Signed)
   Begin Aspirin 15m daily.   Continue all other medications.   Your physician wants you to follow up in:  1 year.  You will receive a reminder letter in the mail one-two months in advance.  If you don't receive a letter, please call our office to schedule the follow up appointment

## 2015-12-25 ENCOUNTER — Encounter (HOSPITAL_COMMUNITY): Payer: Self-pay | Admitting: *Deleted

## 2015-12-25 ENCOUNTER — Emergency Department (HOSPITAL_COMMUNITY)
Admission: EM | Admit: 2015-12-25 | Discharge: 2015-12-25 | Disposition: A | Discharge status: Home / Self Care | Payer: No Typology Code available for payment source | Attending: Emergency Medicine | Admitting: Emergency Medicine

## 2015-12-25 ENCOUNTER — Telehealth: Payer: Self-pay

## 2015-12-25 DIAGNOSIS — Z7982 Long term (current) use of aspirin: Secondary | ICD-10-CM | POA: Diagnosis not present

## 2015-12-25 DIAGNOSIS — R1013 Epigastric pain: Secondary | ICD-10-CM | POA: Diagnosis not present

## 2015-12-25 DIAGNOSIS — I2510 Atherosclerotic heart disease of native coronary artery without angina pectoris: Secondary | ICD-10-CM | POA: Diagnosis not present

## 2015-12-25 DIAGNOSIS — R110 Nausea: Secondary | ICD-10-CM | POA: Diagnosis not present

## 2015-12-25 DIAGNOSIS — Z79899 Other long term (current) drug therapy: Secondary | ICD-10-CM | POA: Diagnosis not present

## 2015-12-25 DIAGNOSIS — Z7984 Long term (current) use of oral hypoglycemic drugs: Secondary | ICD-10-CM | POA: Insufficient documentation

## 2015-12-25 DIAGNOSIS — R1319 Other dysphagia: Secondary | ICD-10-CM

## 2015-12-25 DIAGNOSIS — I10 Essential (primary) hypertension: Secondary | ICD-10-CM | POA: Diagnosis not present

## 2015-12-25 DIAGNOSIS — E785 Hyperlipidemia, unspecified: Secondary | ICD-10-CM | POA: Insufficient documentation

## 2015-12-25 DIAGNOSIS — Z87891 Personal history of nicotine dependence: Secondary | ICD-10-CM | POA: Insufficient documentation

## 2015-12-25 DIAGNOSIS — E119 Type 2 diabetes mellitus without complications: Secondary | ICD-10-CM | POA: Insufficient documentation

## 2015-12-25 DIAGNOSIS — I252 Old myocardial infarction: Secondary | ICD-10-CM | POA: Insufficient documentation

## 2015-12-25 DIAGNOSIS — Z791 Long term (current) use of non-steroidal anti-inflammatories (NSAID): Secondary | ICD-10-CM | POA: Insufficient documentation

## 2015-12-25 DIAGNOSIS — R1310 Dysphagia, unspecified: Secondary | ICD-10-CM | POA: Diagnosis not present

## 2015-12-25 HISTORY — DX: Hyperlipidemia, unspecified: E78.5

## 2015-12-25 HISTORY — DX: Gastro-esophageal reflux disease without esophagitis: K21.9

## 2015-12-25 LAB — COMPREHENSIVE METABOLIC PANEL
ALBUMIN: 4.7 g/dL (ref 3.5–5.0)
ALK PHOS: 64 U/L (ref 38–126)
ALT: 23 U/L (ref 17–63)
AST: 37 U/L (ref 15–41)
Anion gap: 9 (ref 5–15)
BILIRUBIN TOTAL: 0.5 mg/dL (ref 0.3–1.2)
BUN: 20 mg/dL (ref 6–20)
CALCIUM: 9.1 mg/dL (ref 8.9–10.3)
CO2: 24 mmol/L (ref 22–32)
CREATININE: 1.1 mg/dL (ref 0.61–1.24)
Chloride: 102 mmol/L (ref 101–111)
GFR calc Af Amer: >60 mL/min (ref >60)
GLUCOSE: 120 mg/dL — AB (ref 65–99)
Potassium: 4 mmol/L (ref 3.5–5.1)
Sodium: 135 mmol/L (ref 135–145)
TOTAL PROTEIN: 7.8 g/dL (ref 6.5–8.1)

## 2015-12-25 LAB — URINALYSIS, ROUTINE W REFLEX MICROSCOPIC
BILIRUBIN URINE: NEGATIVE
Glucose, UA: NEGATIVE mg/dL
Hgb urine dipstick: NEGATIVE
KETONES UR: NEGATIVE mg/dL
Leukocytes, UA: NEGATIVE
NITRITE: NEGATIVE
PH: 5.5 (ref 5.0–8.0)
PROTEIN: NEGATIVE mg/dL
Specific Gravity, Urine: >1.03 — ABNORMAL HIGH (ref 1.005–1.030)

## 2015-12-25 LAB — CBC
HEMATOCRIT: 45.8 % (ref 39.0–52.0)
Hemoglobin: 16.6 g/dL (ref 13.0–17.0)
MCH: 32.2 pg (ref 26.0–34.0)
MCHC: 36.2 g/dL — AB (ref 30.0–36.0)
MCV: 88.8 fL (ref 78.0–100.0)
PLATELETS: 250 10*3/uL (ref 150–400)
RBC: 5.16 MIL/uL (ref 4.22–5.81)
RDW: 12.2 % (ref 11.5–15.5)
WBC: 9.8 10*3/uL (ref 4.0–10.5)

## 2015-12-25 LAB — TROPONIN I: Troponin I: <0.03 ng/mL (ref <0.03)

## 2015-12-25 LAB — LIPASE, BLOOD: LIPASE: 24 U/L (ref 11–51)

## 2015-12-25 MED ORDER — GI COCKTAIL ~~LOC~~
30.0000 mL | Freq: Once | ORAL | Status: AC
  Administered 2015-12-25: 30 mL via ORAL
  Filled 2015-12-25: qty 30

## 2015-12-25 MED ORDER — METOCLOPRAMIDE HCL 5 MG/ML IJ SOLN
10.0000 mg | Freq: Once | INTRAMUSCULAR | Status: AC
  Administered 2015-12-25: 10 mg via INTRAVENOUS
  Filled 2015-12-25: qty 2

## 2015-12-25 MED ORDER — FAMOTIDINE 20 MG PO TABS
20.0000 mg | ORAL_TABLET | Freq: Once | ORAL | Status: AC
  Administered 2015-12-25: 20 mg via ORAL
  Filled 2015-12-25: qty 1

## 2015-12-25 MED ORDER — SODIUM CHLORIDE 0.9 % IV BOLUS (SEPSIS)
500.0000 mL | Freq: Once | INTRAVENOUS | Status: AC
  Administered 2015-12-25: 500 mL via INTRAVENOUS

## 2015-12-25 NOTE — ED Notes (Signed)
Advised patient we needed a urine specimen.  Left urinal at bedside.

## 2015-12-25 NOTE — ED Provider Notes (Signed)
CSN: [id number]     Arrival date & time 12/25/15  1245 History   First MD Initiated Contact with Patient 12/25/15 1304     Chief Complaint  Patient presents with  . Abdominal Pain     (Consider location/radiation/quality/duration/timing/severity/associated sxs/prior Treatment) HPI Comments: 50 year old male with history of obesity, diabetes, stomach ulcer, high blood pressure presents with recurrent epigastric discomfort, change in odorous breath and tasting acid. She is worse in his normal reflux. Worse with lying flat. 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 Patient had a scope done years ago and was told in ulcer. Patient has had increased stress recently. Patient does have difficulty with solids and liquids swallowing and discomfort as well.  Patient is a 50 y.o. male presenting with abdominal pain. The history is provided by the patient.  Abdominal Pain Associated symptoms: nausea   Associated symptoms: no chest pain, no chills, no dysuria, no fever, no shortness of breath and no vomiting     Past Medical History  Diagnosis Date  . Diabetes mellitus   . Sleep apnea   . CAD (coronary artery disease), minimal, non obstructive 2013 01/01/2013  . Hypertension   . Constipation   . Myocardial infarction ([place])   . Antral erosion 01/17/2014    Per EGD 01/10/14- not likely the cause of his symtoms   . 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 GERD (gastroesophageal reflux disease)   . Hyperlipidemia    Past Surgical History  Procedure Laterality Date  . Esophagogastroduodenoscopy N/A 01/10/2014    Dr. Gala Romney: antral erosions, path with chronic inactive gastritis. Empiric dilation with 56-F dilation  . Savory dilation N/A 01/10/2014    Procedure: SAVORY DILATION;  Surgeon: Daneil Dolin, MD;  [facility]: [facility];  Service: Endoscopy;  Laterality: N/A;  Venia Minks dilation N/A 01/10/2014    Procedure: Venia Minks DILATION;  Surgeon: Daneil Dolin, MD;  Location: [facility];  Service: Endoscopy;  Laterality: N/A;  . Cardiac catheterization  2013    non obstructive CAD  ~20%  . Knee arthroscopy Right 2002  . Cholecystectomy N/A 04/01/2014    Procedure: LAPAROSCOPIC CHOLECYSTECTOMY;  Surgeon: Jamesetta So, MD;  Location: [facility];  Service: General;  Laterality: N/A;  . Liver biopsy N/A 04/01/2014    mild fibrosis  . Left heart cath N/A 08/18/2011    Procedure: LEFT HEART CATH;  Surgeon: Lorretta Harp, MD;  [facility] CATH LAB;  Service: Cardiovascular;  Laterality: N/A;   Family History  Problem Relation Age of Onset  . Diabetes type II Sister   . Heart attack Cousin   . Colon cancer Neg Hx   . Stomach cancer Neg Hx    Social History  Substance Use Topics  . Smoking status: Former Smoker    Quit date: 08/18/1995  . Smokeless tobacco: Never Used     Comment: Quit smoking x 20 years  . Alcohol Use: No    Review of Systems  Constitutional: Negative for fever and chills.  HENT: Negative for congestion.   Eyes: Negative for visual disturbance.  Respiratory: Negative for shortness of breath.   Cardiovascular: Negative for chest pain.  Gastrointestinal: Positive for nausea and abdominal pain. Negative for vomiting and blood in stool.  Genitourinary: Negative for dysuria and flank pain.  Musculoskeletal: Negative for back pain, neck pain and neck stiffness.  Skin: Negative for rash.  Neurological: Negative for light-headedness and headaches.  Allergies  Codeine and Sulfa antibiotics  Home Medications   Prior to Admission medications   Medication Sig Start Date End Date Taking? Authorizing Provider  amLODipine (NORVASC) 10 MG tablet Take 1 tablet (10 mg total) by mouth daily. 07/18/15  Yes Herminio Commons, MD  aspirin EC 81 MG tablet Take 1 tablet (81 mg total) by mouth daily. 11/23/15  Yes Herminio Commons, MD  Azilsartan Medoxomil (EDARBI) 40 MG TABS Take 1 tablet by mouth daily. 11/23/15  Yes Herminio Commons, MD   glimepiride (AMARYL) 4 MG tablet Take 1 tablet by mouth daily. 12/20/15  Yes Historical Provider, MD  ibuprofen (ADVIL,MOTRIN) 200 MG tablet Take 200 mg by mouth 2 (two) times daily as needed for moderate pain.   Yes Historical Provider, MD  isosorbide mononitrate (IMDUR) 60 MG 24 hr tablet Take 1 tablet (60 mg total) by mouth daily. 07/18/15  Yes Herminio Commons, MD  loperamide (IMODIUM A-D) 2 MG tablet Take 2 mg by mouth 4 (four) times daily as needed for diarrhea or loose stools.   Yes Historical Provider, MD  metFORMIN (GLUCOPHAGE) 500 MG tablet Take 500 mg by mouth daily with breakfast.    Yes Historical Provider, MD  nitroGLYCERIN (NITROSTAT) 0.4 MG SL tablet Place 1 tablet (0.4 mg total) under the tongue every 5 (five) minutes as needed for chest pain. 07/18/15  Yes Herminio Commons, MD  pantoprazole (PROTONIX) 40 MG tablet Take 1 tablet (40 mg total) by mouth 2 (two) times daily. Patient taking differently: Take 40 mg by mouth daily.  01/18/14  Yes Lezlie Octave Black, NP  ranitidine (ZANTAC) 150 MG tablet Take 300 mg by mouth daily as needed for heartburn.   Yes Historical Provider, MD  simvastatin (ZOCOR) 40 MG tablet Take 40 mg by mouth every evening.   Yes Historical Provider, MD  sitaGLIPtin (JANUVIA) 100 MG tablet Take 100 mg by mouth daily.   Yes Historical Provider, MD  glyBURIDE micronized (GLYNASE) 6 MG tablet Take 6 mg by mouth daily with breakfast. Reported on 12/25/2015    Historical Provider, MD  Magnesium Gluconate 27.5 MG TABS Take 1 tablet by mouth as needed. Reported on 12/25/2015    Historical Provider, MD   BP 114/66 mmHg  Pulse 71  Temp(Src) 98.7 F (37.1 C) (Oral)  Resp 18  Ht 6\' 4"  (1.93 m)  Wt 312 lb (141.522 kg)  BMI 37.99 kg/m2  SpO2 96% Physical Exam  Constitutional: He is oriented to person, place, and time. He appears well-developed and well-nourished.  HENT:  Head: Normocephalic and atraumatic.  Eyes: Conjunctivae are normal. Right eye exhibits no  discharge. Left eye exhibits no discharge.  Neck: Normal range of motion. Neck supple. No tracheal deviation present.  Cardiovascular: Normal rate, regular rhythm and intact distal pulses.   Pulmonary/Chest: Effort normal and breath sounds normal.  Abdominal: Soft. He exhibits no distension. There is tenderness (mild epig). There is no guarding.  Musculoskeletal: He exhibits no edema.  Neurological: He is alert and oriented to person, place, and time.  Skin: Skin is warm. No rash noted.  Psychiatric: He has a normal mood and affect.  Nursing note and vitals reviewed.   ED Course  Procedures (including critical care time) Labs Review Labs Reviewed  COMPREHENSIVE METABOLIC PANEL - Abnormal; Notable for the following:    Glucose, Bld 120 (*)    All other components within normal limits  CBC - Abnormal; Notable for the following:    MCHC 36.2 (*)  All other components within normal limits  URINALYSIS, ROUTINE W REFLEX MICROSCOPIC (NOT AT [facility]) - Abnormal; Notable for the following:    Specific Gravity, Urine >1.030 (*)    All other components within normal limits  LIPASE, BLOOD  TROPONIN I  H. PYLORI ANTIBODY, IGG    Imaging Review No results found. I have personally reviewed and evaluated these images and lab results as part of my medical decision-making.   EKG Interpretation   Date/Time:  Monday December 25 2015 12:56:32 EDT Ventricular Rate:  75 PR Interval:  194 QRS Duration: 106 QT Interval:  376 QTC Calculation: 419 R Axis:   -16 Text Interpretation:  Normal sinus rhythm Overall similar to previous  Confirmed by Daylan Juhnke MD, Trenity Pha [phone]) on 12/25/2015 1:04:52 PM      MDM   Final diagnoses:  Epigastric pain  Esophageal dysphagia   Patient presents with clinical concern for ulcer/reflux with possible stricture. Patient can tolerate liquid, vitals unremarkable in the ER. With ulcer history and symptoms patient likely will need EGD. Patient tried to see his gas or  neurologist however unable to eat appointment to mid August. Discussed continuing to try to follow-up with his gas or neurologist for cancellations or other gastroenterologist. Liver screening blood work and troponin in case atypical symptoms.  Cardiac screen neg.  Pt needs GI outpt fup.  Results and differential diagnosis were discussed with the patient/parent/guardian. Xrays were independently reviewed by myself.  Close follow up outpatient was discussed, comfortable with the plan.   Medications  gi cocktail (Maalox,Lidocaine,Donnatal) (30 mLs Oral Given 12/25/15 1417)  famotidine (PEPCID) tablet 20 mg (20 mg Oral Given 12/25/15 1417)  metoCLOPramide (REGLAN) injection 10 mg (10 mg Intravenous Given 12/25/15 1417)  sodium chloride 0.9 % bolus 500 mL (500 mLs Intravenous New Bag/Given 12/25/15 1417)    Filed Vitals:   12/25/15 1255 12/25/15 1330 12/25/15 1400  BP: 116/56 113/58 114/66  Pulse: 82 74 71  Temp: 98.7 F (37.1 C)    TempSrc: Oral    Resp: 18    Height: 6\' 4"  (1.93 m)    Weight: 312 lb (141.522 kg)    SpO2: 97% 96% 96%    Final diagnoses:  Epigastric pain  Esophageal dysphagia        Elnora Morrison, MD 12/25/15 [phone]

## 2015-12-25 NOTE — Telephone Encounter (Signed)
Pt called today wanting to see someone because he is having stomach pains. I told him that we didn't have any appointments until the end of the month but I could put a phone note in for him. He said that he was going to go to the ER since the pain was so bad.

## 2015-12-25 NOTE — ED Notes (Signed)
Pt comes in with abdominal pain increasing in the last week.  Pt has hx of a gastric ulcer, states this feels the same way. Pt verbalizes he can taste acid.

## 2015-12-25 NOTE — Telephone Encounter (Signed)
Noted  

## 2015-12-25 NOTE — Discharge Instructions (Signed)
Adjust diet to softer foods until you see GI. Follow up for H Pylori results.   If you were given medicines take as directed.  If you are on coumadin or contraceptives realize their levels and effectiveness is altered by many different medicines.  If you have any reaction (rash, tongues swelling, other) to the medicines stop taking and see a physician.    If your blood pressure was elevated in the ER make sure you follow up for management with a primary doctor or return for chest pain, shortness of breath or stroke symptoms.  Please follow up as directed and return to the ER or see a physician for new or worsening symptoms.  Thank you. Filed Vitals:   12/25/15 1255  BP: 116/56  Pulse: 82  Temp: 98.7 F (37.1 C)  TempSrc: Oral  Resp: 18  Height: 6\' 4"  (1.93 m)  Weight: 312 lb (141.522 kg)  SpO2: 97%

## 2015-12-27 LAB — H. PYLORI ANTIBODY, IGG

## 2016-02-06 ENCOUNTER — Encounter: Payer: Self-pay | Admitting: Internal Medicine

## 2016-02-06 ENCOUNTER — Other Ambulatory Visit: Payer: Self-pay

## 2016-02-06 ENCOUNTER — Ambulatory Visit (INDEPENDENT_AMBULATORY_CARE_PROVIDER_SITE_OTHER): Payer: No Typology Code available for payment source | Admitting: Internal Medicine

## 2016-02-06 VITALS — BP 141/81 | HR 87 | Temp 98.6°F | Ht 76.0 in | Wt 313.0 lb

## 2016-02-06 DIAGNOSIS — K760 Fatty (change of) liver, not elsewhere classified: Secondary | ICD-10-CM | POA: Diagnosis not present

## 2016-02-06 DIAGNOSIS — K219 Gastro-esophageal reflux disease without esophagitis: Secondary | ICD-10-CM

## 2016-02-06 DIAGNOSIS — R1310 Dysphagia, unspecified: Secondary | ICD-10-CM

## 2016-02-06 NOTE — Patient Instructions (Signed)
Schedule EGD with ED - propofol- dysphagia / GERD  Continue protonix 40 mg twice daily  Increase aerobic exercise  Strive for a 25 pound weight between now and the end of the year  I - 2 cups of coffee daily will help your liver

## 2016-02-06 NOTE — Progress Notes (Signed)
Primary Care Physician:  Glenda Chroman, MD Primary Gastroenterologist:  Dr. Gala Romney  Pre-Procedure History & Physical: HPI:  Jerome Shepherd is a 50 y.o. male here for further evaluation of worsening GERD. Relates intermittent esophageal dysphagia to solids and liquids. Feels sometimes food just sits in the stomach for a long time after he eats. Hemoglobin A1c's in the 10 range in the past year -  now down to 6 with better glycemic control. He continues to be significantly obese. Gallbladder is out. 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 He's been taking Protonix 40 mg once daily. Also, supplements with multiple Zantac taken daily for breakthrough symptoms.  Does not use alcohol. 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  History of EGD with esophageal dilation empirically for dysphagia (no obstruction seen) 2015. Patient does not recall much improvement in dysphagia symptoms after dilation.  Past Medical History:  Diagnosis Date  . 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 CAD (coronary artery disease), minimal, non obstructive 2013 01/01/2013  . Constipation   . 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 Diabetes mellitus   . GERD (gastroesophageal reflux disease)   . Hyperlipidemia   . Hypertension   . Myocardial infarction ([place])   . Sleep apnea     Past Surgical History:  Procedure Laterality Date  . CARDIAC CATHETERIZATION  2013   non obstructive CAD  ~20%  . CHOLECYSTECTOMY N/A 04/01/2014   Procedure: LAPAROSCOPIC CHOLECYSTECTOMY;  Surgeon: Jamesetta So, MD;  Location: [facility];  Service: General;  Laterality: N/A;  . ESOPHAGOGASTRODUODENOSCOPY N/A 01/10/2014   Dr. Gala Romney: antral erosions, path with chronic inactive gastritis. Empiric dilation with 56-F dilation  . KNEE ARTHROSCOPY Right 2002  . LEFT HEART CATH N/A 08/18/2011   Procedure: LEFT HEART CATH;  Surgeon: Lorretta Harp, MD;  Location: [facility]  CATH LAB;  Service: Cardiovascular;  Laterality: N/A;  . LIVER BIOPSY N/A 04/01/2014   mild fibrosis  . MALONEY DILATION N/A 01/10/2014   Procedure: Venia Minks DILATION;  Surgeon: Daneil Dolin, MD;  Location: [facility];  Service: Endoscopy;  Laterality: N/A;  . SAVORY DILATION N/A 01/10/2014   Procedure: SAVORY DILATION;  Surgeon: Daneil Dolin, MD;  Location: [facility];  Service: Endoscopy;  Laterality: N/A;    Prior to Admission medications   Medication Sig Start Date End Date Taking? Authorizing Provider  amLODipine (NORVASC) 10 MG tablet Take 1 tablet (10 mg total) by mouth daily. 07/18/15  Yes Herminio Commons, MD  aspirin EC 81 MG tablet Take 1 tablet (81 mg total) by mouth daily. 11/23/15  Yes Herminio Commons, MD  Azilsartan Medoxomil (EDARBI) 40 MG TABS Take 1 tablet by mouth daily. 11/23/15  Yes Herminio Commons, MD  glimepiride (AMARYL) 4 MG tablet Take 1 tablet by mouth daily. 12/20/15  Yes Historical Provider, MD  glyBURIDE micronized (GLYNASE) 6 MG tablet Take 6 mg by mouth daily with breakfast. Reported on 12/25/2015   Yes Historical Provider, MD  ibuprofen (ADVIL,MOTRIN) 200 MG tablet Take 200 mg by mouth 2 (two) times daily as needed for moderate pain.   Yes Historical Provider, MD  isosorbide mononitrate (IMDUR) 60 MG 24 hr tablet Take 1 tablet (60 mg total) by mouth daily. 07/18/15  Yes Herminio Commons, MD  loperamide (IMODIUM A-D) 2 MG tablet Take 2 mg by mouth 4 (four) times daily as needed  for diarrhea or loose stools.   Yes Historical Provider, MD  Magnesium Gluconate 27.5 MG TABS Take 1 tablet by mouth as needed. Reported on 12/25/2015   Yes Historical Provider, MD  metFORMIN (GLUCOPHAGE) 500 MG tablet Take 500 mg by mouth daily with breakfast.    Yes Historical Provider, MD  nitroGLYCERIN (NITROSTAT) 0.4 MG SL tablet Place 1 tablet (0.4 mg total) under the tongue every 5 (five) minutes as needed for chest pain. 07/18/15  Yes Herminio Commons, MD  pantoprazole  (PROTONIX) 40 MG tablet Take 1 tablet (40 mg total) by mouth 2 (two) times daily. Patient taking differently: Take 40 mg by mouth daily.  01/18/14  Yes Lezlie Octave Black, NP  ranitidine (ZANTAC) 150 MG tablet Take 300 mg by mouth 5 (five) times daily as needed for heartburn. Taking approximately 5 times per day   Yes Historical Provider, MD  simvastatin (ZOCOR) 40 MG tablet Take 40 mg by mouth every evening.   Yes Historical Provider, MD  sitaGLIPtin (JANUVIA) 100 MG tablet Take 100 mg by mouth daily.   Yes Historical Provider, MD    Allergies as of 02/06/2016 - Review Complete 02/06/2016  Allergen Reaction Noted  . Codeine Nausea Only 08/18/2011  . Sulfa antibiotics Nausea Only 08/18/2011    Family History  Problem Relation Age of Onset  . Diabetes type II Sister   . Heart attack Cousin   . Colon cancer Neg Hx   . Stomach cancer Neg Hx     Social History   Social History  . Marital status: Single    Spouse name: N/A  . Number of children: N/A  . Years of education: N/A   Occupational History  . truck driver Product manager   Social History Main Topics  . Smoking status: Former Smoker    Quit date: 08/18/1995  . Smokeless tobacco: Never Used     Comment: Quit smoking x 20 years  . Alcohol use No  . Drug use: No  . Sexual activity: Not Currently   Other Topics Concern  . Not on file   Social History Narrative   Divorced, no children, works as a Production designer, theatre/television/film.    Review of Systems: See HPI, otherwise negative ROS  Physical Exam: BP (!) 141/81   Pulse 87   Temp 98.6 F (37 C) (Oral)   Ht 6\' 4"  (1.93 m)   Wt (!) 313 lb (142 kg)   BMI 38.10 kg/m  General:   Alert,  Well-developed, well-nourished, pleasant and cooperative in NAD Nose:  No deformity, discharge,  or lesions. Mouth:  No deformity or lesions. Neck:  Supple; no masses or thyromegaly. No significant cervical adenopathy. Lungs:  Clear throughout to auscultation.   No wheezes, crackles, or  rhonchi. No acute distress. Heart:  Regular rate and rhythm; no murmurs, clicks, rubs,  or gallops. Abdomen: Non-distended, normal bowel sounds.  Soft and nontender without appreciable mass or hepatosplenomegaly.  Pulses:  Normal pulses noted. Extremities:  Without clubbing or edema.  Impression:   Pleasant obese 50 year old gentleman diabetes and somewhat refractory GERD complains now of esophageal dysphagia to solids and sometimes liquids. Currently taking Protonix 40 mg once daily. Recent hemoglobin A1c's in the  double-digit range likely undermining efforts at treating GERD. He is significantly over weight and this may also a factor. Hx biopsy-proven [name] with grade 1 fibrosis.  He may have an underlying esophageal motility disorder. Also, gastroparesis could be an issue with the remissions and exacerbations from  time to time.  Recommendations:  I have offered the patient a diagnostic EGD with probable esophageal dilation as feasible/appropriate in the near future.  The risks, benefits, limitations, alternatives and imponderables have been reviewed with the patient. Potential for esophageal dilation, biopsy, etc. have also been reviewed.  Questions have been answered. All parties agreeable.  Continue protonix 40 daily for the time being  Increase aerobic exercise  Strive for a 25 pound weight between now and the end of the year  I - 2 cups of coffee daily will help your liver       Notice: This dictation was prepared with Dragon dictation along with smaller phrase technology. Any transcriptional errors that result from this process are unintentional and may not be corrected upon review.

## 2016-02-07 ENCOUNTER — Other Ambulatory Visit: Payer: Self-pay

## 2016-02-07 DIAGNOSIS — R1310 Dysphagia, unspecified: Secondary | ICD-10-CM

## 2016-02-07 DIAGNOSIS — K219 Gastro-esophageal reflux disease without esophagitis: Secondary | ICD-10-CM

## 2016-02-12 NOTE — Patient Instructions (Signed)
Jerome Shepherd  02/12/2016     @PREFPERIOPPHARMACY @   Your procedure is scheduled on 02/16/2016.  Report to [facility] at 7:45 A.M.  Call this number if you have problems the morning of surgery:  [phone]   Remember:  Do not eat food or drink liquids after midnight.  Take these medicines the morning of surgery with A SIP OF WATER Amlodipine, Edarbi, Imdur, Protonix, Zantac  DO NOT TAKE DIABETIC MEDICATIONS DAY OF PROCEDURE   Do not wear jewelry, make-up or nail polish.  Do not wear lotions, powders, or perfumes.  You may wear deoderant.  Do not shave 48 hours prior to surgery.  Men may shave face and neck.  Do not bring valuables to the hospital.  [facility] is not responsible for any belongings or valuables.  Contacts, dentures or bridgework may not be worn into surgery.  Leave your suitcase in the car.  After surgery it may be brought to your room.  For patients admitted to the hospital, discharge time will be determined by your treatment team.  Patients discharged the day of surgery will not be allowed to drive home.    Please read over the following fact sheets that you were given. Anesthesia Post-op Instructions     PATIENT INSTRUCTIONS POST-ANESTHESIA  IMMEDIATELY FOLLOWING SURGERY:  Do not drive or operate machinery for the first twenty four hours after surgery.  Do not make any important decisions for twenty four hours after surgery or while taking narcotic pain medications or sedatives.  If you develop intractable nausea and vomiting or a severe headache please notify your doctor immediately.  FOLLOW-UP:  Please make an appointment with your surgeon as instructed. You do not need to follow up with anesthesia unless specifically instructed to do so.  WOUND CARE INSTRUCTIONS (if applicable):  Keep a dry clean dressing on the anesthesia/puncture wound site if there is drainage.  Once the wound has quit draining you may leave it open to air.  Generally you should  leave the bandage intact for twenty four hours unless there is drainage.  If the epidural site drains for more than 36-48 hours please call the anesthesia department.  QUESTIONS?:  Please feel free to call your physician or the hospital operator if you have any questions, and they will be happy to assist you.      Esophageal Dilatation Esophageal dilatation is a procedure to open a blocked or narrowed part of the esophagus. The esophagus is the long tube in your throat that carries food and liquid from your mouth to your stomach. The procedure is also called esophageal dilation.  You may need this procedure if you have a buildup of scar tissue in your esophagus that makes it difficult, painful, or even impossible to swallow. This can be caused by gastroesophageal reflux disease (GERD). In rare cases, people need this procedure because they have cancer of the esophagus or a problem with the way food moves through the esophagus. Sometimes you may need to have another dilatation to enlarge the opening of the esophagus gradually. LET [facility] CARE PROVIDER KNOW ABOUT:   Any allergies you have.  All medicines you are taking, including vitamins, herbs, eye drops, creams, and over-the-counter medicines.  Previous problems you or members of your family have had with the use of anesthetics.  Any blood disorders you have.  Previous surgeries you have had.  Medical conditions you have.  Any antibiotic medicines you are required to take before dental procedures.  RISKS AND COMPLICATIONS Generally, this is a safe procedure. However, problems can occur and include:  Bleeding from a tear in the lining of the esophagus.  A hole (perforation) in the esophagus. BEFORE THE PROCEDURE  Do not eat or drink anything after midnight on the night before the procedure or as directed by your health care provider.  Ask your health care provider about changing or stopping your regular medicines. This is  especially important if you are taking diabetes medicines or blood thinners.  Plan to have someone take you home after the procedure. PROCEDURE   You will be given a medicine that makes you relaxed and sleepy (sedative).  A medicine may be sprayed or gargled to numb the back of the throat.  Your health care provider can use various instruments to do an esophageal dilatation. During the procedure, the instrument used will be placed in your mouth and passed down into your esophagus. Options include:  Simple dilators. This instrument is carefully placed in the esophagus to stretch it.  Guided wire bougies. In this method, a flexible tube (endoscope) is used to insert a wire into the esophagus. The dilator is passed over this wire to enlarge the esophagus. Then the wire is removed.  Balloon dilators. An endoscope with a small balloon at the end is passed down into the esophagus. Inflating the balloon gently stretches the esophagus and opens it up. AFTER THE PROCEDURE  Your blood pressure, heart rate, breathing rate, and blood oxygen level will be monitored often until the medicines you were given have worn off.  Your throat may feel slightly sore and will probably still feel numb. This will improve slowly over time.  You will not be allowed to eat or drink until the throat numbness has resolved.  If this is a same-day procedure, you may be allowed to go home once you have been able to drink, urinate, and sit on the edge of the bed without nausea or dizziness.  If this is a same-day procedure, you should have a friend or family member with you for the next 24 hours after the procedure.   This information is not intended to replace advice given to you by your health care provider. Make sure you discuss any questions you have with your health care provider.   Document Released: 08/01/2005 Document Revised: 07/01/2014 Document Reviewed: 10/20/2013 Elsevier Interactive Patient Education 2016  [employer]. Esophagogastroduodenoscopy Esophagogastroduodenoscopy (EGD) is a procedure that is used to examine the lining of the esophagus, stomach, and first part of the small intestine (duodenum). A long, flexible, lighted tube with a camera attached (endoscope) is inserted down the throat to view these organs. This procedure is done to detect problems or abnormalities, such as inflammation, bleeding, ulcers, or growths, in order to treat them. The procedure lasts 5-20 minutes. It is usually an outpatient procedure, but it may need to be performed in a hospital in emergency cases. LET [facility] CARE PROVIDER KNOW ABOUT:  Any allergies you have.  All medicines you are taking, including vitamins, herbs, eye drops, creams, and over-the-counter medicines.  Previous problems you or members of your family have had with the use of anesthetics.  Any blood disorders you have.  Previous surgeries you have had.  Medical conditions you have. RISKS AND COMPLICATIONS Generally, this is a safe procedure. However, problems can occur and include:  Infection.  Bleeding.  Tearing (perforation) of the esophagus, stomach, or duodenum.  Difficulty breathing or not being able to breathe.  Excessive sweating.  Spasms of the larynx.  Slowed heartbeat.  Low blood pressure. BEFORE THE PROCEDURE  Do not eat or drink anything after midnight on the night before the procedure or as directed by your health care provider.  Do not take your regular medicines before the procedure if your health care provider asks you not to. Ask your health care provider about changing or stopping those medicines.  If you wear dentures, be prepared to remove them before the procedure.  Arrange for someone to drive you home after the procedure. PROCEDURE  A numbing medicine (local anesthetic) may be sprayed in your throat for comfort and to stop you from gagging or coughing.  You will have an IV tube inserted in a  vein in your hand or arm. You will receive medicines and fluids through this tube.  You will be given a medicine to relax you (sedative).  A pain reliever will be given through the IV tube.  A mouth guard may be placed in your mouth to protect your teeth and to keep you from biting on the endoscope.  You will be asked to lie on your left side.  The endoscope will be inserted down your throat and into your esophagus, stomach, and duodenum.  Air will be put through the endoscope to allow your health care provider to clearly view the lining of your esophagus.  The lining of your esophagus, stomach, and duodenum will be examined. During the exam, your health care provider may:  Remove tissue to be examined under a microscope (biopsy) for inflammation, infection, or other medical problems.  Remove growths.  Remove objects (foreign bodies) that are stuck.  Treat any bleeding with medicines or other devices that stop tissues from bleeding (hot cautery, clipping devices).  Widen (dilate) or stretch narrowed areas of your esophagus and stomach.  The endoscope will be withdrawn. AFTER THE PROCEDURE  You will be taken to a recovery area for observation. Your blood pressure, heart rate, breathing rate, and blood oxygen level will be monitored often until the medicines you were given have worn off.  Do not eat or drink anything until the numbing medicine has worn off and your gag reflex has returned. You may choke.  Your health care provider should be able to discuss his or her findings with you. It will take longer to discuss the test results if any biopsies were taken.   This information is not intended to replace advice given to you by your health care provider. Make sure you discuss any questions you have with your health care provider.   Document Released: 10/11/2004 Document Revised: 07/01/2014 Document Reviewed: 05/13/2012 Elsevier Interactive Patient Education [employer].

## 2016-02-13 ENCOUNTER — Other Ambulatory Visit: Payer: Self-pay | Admitting: Obstetrics and Gynecology

## 2016-02-13 ENCOUNTER — Encounter (HOSPITAL_COMMUNITY)
Admission: RE | Admit: 2016-02-13 | Discharge: 2016-02-13 | Disposition: A | Discharge status: Home / Self Care | Payer: No Typology Code available for payment source | Source: Ambulatory Visit | Attending: Internal Medicine | Admitting: Internal Medicine

## 2016-02-13 ENCOUNTER — Encounter (HOSPITAL_COMMUNITY): Payer: Self-pay

## 2016-02-13 DIAGNOSIS — K449 Diaphragmatic hernia without obstruction or gangrene: Secondary | ICD-10-CM | POA: Diagnosis not present

## 2016-02-13 DIAGNOSIS — Z7982 Long term (current) use of aspirin: Secondary | ICD-10-CM | POA: Diagnosis not present

## 2016-02-13 DIAGNOSIS — Z79899 Other long term (current) drug therapy: Secondary | ICD-10-CM | POA: Diagnosis not present

## 2016-02-13 DIAGNOSIS — Z87891 Personal history of nicotine dependence: Secondary | ICD-10-CM | POA: Diagnosis not present

## 2016-02-13 DIAGNOSIS — G4730 Sleep apnea, unspecified: Secondary | ICD-10-CM | POA: Diagnosis not present

## 2016-02-13 DIAGNOSIS — Z6838 Body mass index (BMI) 38.0-38.9, adult: Secondary | ICD-10-CM | POA: Diagnosis not present

## 2016-02-13 DIAGNOSIS — I2510 Atherosclerotic heart disease of native coronary artery without angina pectoris: Secondary | ICD-10-CM | POA: Diagnosis not present

## 2016-02-13 DIAGNOSIS — Z9989 Dependence on other enabling machines and devices: Secondary | ICD-10-CM | POA: Diagnosis not present

## 2016-02-13 DIAGNOSIS — I252 Old myocardial infarction: Secondary | ICD-10-CM | POA: Diagnosis not present

## 2016-02-13 DIAGNOSIS — K3184 Gastroparesis: Secondary | ICD-10-CM | POA: Diagnosis not present

## 2016-02-13 DIAGNOSIS — R1310 Dysphagia, unspecified: Secondary | ICD-10-CM | POA: Diagnosis not present

## 2016-02-13 DIAGNOSIS — Z7984 Long term (current) use of oral hypoglycemic drugs: Secondary | ICD-10-CM | POA: Diagnosis not present

## 2016-02-13 DIAGNOSIS — K219 Gastro-esophageal reflux disease without esophagitis: Secondary | ICD-10-CM | POA: Diagnosis present

## 2016-02-13 DIAGNOSIS — E1143 Type 2 diabetes mellitus with diabetic autonomic (poly)neuropathy: Secondary | ICD-10-CM | POA: Diagnosis not present

## 2016-02-13 DIAGNOSIS — I10 Essential (primary) hypertension: Secondary | ICD-10-CM | POA: Diagnosis not present

## 2016-02-13 LAB — BASIC METABOLIC PANEL
ANION GAP: 11 (ref 5–15)
BUN: 20 mg/dL (ref 6–20)
CHLORIDE: 104 mmol/L (ref 101–111)
CO2: 22 mmol/L (ref 22–32)
Calcium: 9 mg/dL (ref 8.9–10.3)
Creatinine, Ser: 1 mg/dL (ref 0.61–1.24)
GFR calc non Af Amer: >60 mL/min (ref >60)
Glucose, Bld: 228 mg/dL — ABNORMAL HIGH (ref 65–99)
Potassium: 3.9 mmol/L (ref 3.5–5.1)
Sodium: 137 mmol/L (ref 135–145)

## 2016-02-13 LAB — CBC
HEMATOCRIT: 42.1 % (ref 39.0–52.0)
HEMOGLOBIN: 15.2 g/dL (ref 13.0–17.0)
MCH: 32 pg (ref 26.0–34.0)
MCHC: 36.1 g/dL — ABNORMAL HIGH (ref 30.0–36.0)
MCV: 88.6 fL (ref 78.0–100.0)
Platelets: 232 10*3/uL (ref 150–400)
RBC: 4.75 MIL/uL (ref 4.22–5.81)
RDW: 12.2 % (ref 11.5–15.5)
WBC: 8.5 10*3/uL (ref 4.0–10.5)

## 2016-02-14 NOTE — Anesthesia Preprocedure Evaluation (Addendum)
Anesthesia Evaluation  Patient identified by MRN, date of birth, ID band Patient awake    Reviewed: Allergy & Precautions, NPO status , Patient's Chart, lab work & pertinent test results  Airway Mallampati: II  TM Distance: >3 FB Neck ROM: Full    Dental  (+) Teeth Intact   Pulmonary sleep apnea and Continuous Positive Airway Pressure Ventilation , former smoker,    Pulmonary exam normal breath sounds clear to auscultation       Cardiovascular hypertension, Pt. on medications + angina + CAD and + Past MI  Normal cardiovascular exam Rhythm:Regular Rate:Normal     Neuro/Psych  Neuromuscular disease negative psych ROS   GI/Hepatic PUD, GERD  Medicated and Controlled,Dysphagia   Endo/Other  diabetes, Well Controlled, Type 2, Oral Hypoglycemic AgentsMorbid obesityHyperlipidemia  Renal/GU    ED    Musculoskeletal   Abdominal (+) + obese,   Peds  Hematology negative hematology ROS (+)   Anesthesia Other Findings   Reproductive/Obstetrics                            Lab Results  Component Value Date   WBC 8.5 02/13/2016   HGB 15.2 02/13/2016   HCT 42.1 02/13/2016   MCV 88.6 02/13/2016   PLT 232 02/13/2016     Chemistry      Component Value Date/Time   NA 137 02/13/2016 1400   K 3.9 02/13/2016 1400   CL 104 02/13/2016 1400   CO2 22 02/13/2016 1400   BUN 20 02/13/2016 1400   CREATININE 1.00 02/13/2016 1400      Component Value Date/Time   CALCIUM 9.0 02/13/2016 1400   ALKPHOS 64 12/25/2015 1310   AST 37 12/25/2015 1310   ALT 23 12/25/2015 1310   BILITOT 0.5 12/25/2015 1310     Lab Results  Component Value Date   HGBA1C 9.2 (H) 01/17/2014  EKG: normal sinus rhythm, poor R wave progression anterior leads, possible old AWMI.  Nuclear stress test- 02/17/2015  There was no ST segment deviation noted during stress.  There is a small defect of mild severity present in the apical  lateral location. The defect is partially reversible. Very small apical lateral defect that is most consistent with variable soft tissue attenuation rather than ischemia.  This is a low risk study.  Nuclear stress EF: 55%.     Anesthesia Physical Anesthesia Plan  ASA: III  Anesthesia Plan: MAC   Post-op Pain Management:    Induction:   Airway Management Planned: Natural Airway and Nasal Cannula  Additional Equipment:   Intra-op Plan:   Post-operative Plan:   Informed Consent: I have reviewed the patients History and Physical, chart, labs and discussed the procedure including the risks, benefits and alternatives for the proposed anesthesia with the patient or authorized representative who has indicated his/her understanding and acceptance.     Plan Discussed with: Anesthesiologist, CRNA and Surgeon  Anesthesia Plan Comments:         Anesthesia Quick Evaluation

## 2016-02-16 ENCOUNTER — Ambulatory Visit (HOSPITAL_COMMUNITY): Payer: No Typology Code available for payment source | Admitting: Anesthesiology

## 2016-02-16 ENCOUNTER — Telehealth: Payer: Self-pay | Admitting: Internal Medicine

## 2016-02-16 ENCOUNTER — Encounter (HOSPITAL_COMMUNITY)
Admission: RE | Disposition: A | Discharge status: Home / Self Care | Payer: Self-pay | Source: Ambulatory Visit | Attending: Internal Medicine

## 2016-02-16 ENCOUNTER — Ambulatory Visit (HOSPITAL_COMMUNITY)
Admission: RE | Admit: 2016-02-16 | Discharge: 2016-02-16 | Disposition: A | Discharge status: Home / Self Care | Payer: No Typology Code available for payment source | Source: Ambulatory Visit | Attending: Internal Medicine | Admitting: Internal Medicine

## 2016-02-16 ENCOUNTER — Encounter (HOSPITAL_COMMUNITY): Payer: Self-pay | Admitting: *Deleted

## 2016-02-16 ENCOUNTER — Telehealth: Payer: Self-pay

## 2016-02-16 DIAGNOSIS — Z87891 Personal history of nicotine dependence: Secondary | ICD-10-CM | POA: Insufficient documentation

## 2016-02-16 DIAGNOSIS — G4730 Sleep apnea, unspecified: Secondary | ICD-10-CM | POA: Insufficient documentation

## 2016-02-16 DIAGNOSIS — K219 Gastro-esophageal reflux disease without esophagitis: Secondary | ICD-10-CM

## 2016-02-16 DIAGNOSIS — Z7984 Long term (current) use of oral hypoglycemic drugs: Secondary | ICD-10-CM | POA: Insufficient documentation

## 2016-02-16 DIAGNOSIS — Z9989 Dependence on other enabling machines and devices: Secondary | ICD-10-CM | POA: Insufficient documentation

## 2016-02-16 DIAGNOSIS — I2510 Atherosclerotic heart disease of native coronary artery without angina pectoris: Secondary | ICD-10-CM | POA: Insufficient documentation

## 2016-02-16 DIAGNOSIS — Z79899 Other long term (current) drug therapy: Secondary | ICD-10-CM | POA: Insufficient documentation

## 2016-02-16 DIAGNOSIS — K449 Diaphragmatic hernia without obstruction or gangrene: Secondary | ICD-10-CM | POA: Insufficient documentation

## 2016-02-16 DIAGNOSIS — E1143 Type 2 diabetes mellitus with diabetic autonomic (poly)neuropathy: Secondary | ICD-10-CM | POA: Insufficient documentation

## 2016-02-16 DIAGNOSIS — I252 Old myocardial infarction: Secondary | ICD-10-CM | POA: Insufficient documentation

## 2016-02-16 DIAGNOSIS — R1310 Dysphagia, unspecified: Secondary | ICD-10-CM

## 2016-02-16 DIAGNOSIS — K3184 Gastroparesis: Secondary | ICD-10-CM | POA: Insufficient documentation

## 2016-02-16 DIAGNOSIS — I10 Essential (primary) hypertension: Secondary | ICD-10-CM | POA: Insufficient documentation

## 2016-02-16 DIAGNOSIS — Z6838 Body mass index (BMI) 38.0-38.9, adult: Secondary | ICD-10-CM | POA: Insufficient documentation

## 2016-02-16 DIAGNOSIS — Z7982 Long term (current) use of aspirin: Secondary | ICD-10-CM | POA: Insufficient documentation

## 2016-02-16 HISTORY — PX: ESOPHAGOGASTRODUODENOSCOPY (EGD) WITH PROPOFOL: SHX5813

## 2016-02-16 HISTORY — PX: MALONEY DILATION: SHX5535

## 2016-02-16 LAB — GLUCOSE, CAPILLARY
Glucose-Capillary: 206 mg/dL — ABNORMAL HIGH (ref 65–99)
Glucose-Capillary: 191 mg/dL — ABNORMAL HIGH (ref 65–99)

## 2016-02-16 SURGERY — ESOPHAGOGASTRODUODENOSCOPY (EGD) WITH PROPOFOL
Anesthesia: Monitor Anesthesia Care

## 2016-02-16 MED ORDER — PROPOFOL 500 MG/50ML IV EMUL
INTRAVENOUS | Status: DC | PRN
  Administered 2016-02-16: 125 ug/kg/min via INTRAVENOUS
  Administered 2016-02-16: 150 ug/kg/min via INTRAVENOUS

## 2016-02-16 MED ORDER — MIDAZOLAM HCL 5 MG/5ML IJ SOLN
INTRAMUSCULAR | Status: DC | PRN
  Administered 2016-02-16 (×2): 2 mg via INTRAVENOUS
  Administered 2016-02-16: 1 mg via INTRAVENOUS

## 2016-02-16 MED ORDER — LACTATED RINGERS IV SOLN: INTRAVENOUS | Status: DC

## 2016-02-16 MED ORDER — CHLORHEXIDINE GLUCONATE CLOTH 2 % EX PADS: 6.0000 | MEDICATED_PAD | Freq: Once | CUTANEOUS | Status: DC

## 2016-02-16 MED ORDER — LIDOCAINE VISCOUS 2 % MT SOLN
OROMUCOSAL | Status: AC
  Filled 2016-02-16: qty 15

## 2016-02-16 MED ORDER — ONDANSETRON HCL 4 MG/2ML IJ SOLN: 4.0000 mg | Freq: Once | INTRAMUSCULAR | Status: DC | PRN

## 2016-02-16 MED ORDER — LIDOCAINE VISCOUS 2 % MT SOLN
15.0000 mL | Freq: Once | OROMUCOSAL | Status: AC
  Administered 2016-02-16: 15 mL via OROMUCOSAL

## 2016-02-16 MED ORDER — PROPOFOL 10 MG/ML IV BOLUS
INTRAVENOUS | Status: AC
  Filled 2016-02-16: qty 40

## 2016-02-16 MED ORDER — MIDAZOLAM HCL 5 MG/5ML IJ SOLN
INTRAMUSCULAR | Status: AC
  Filled 2016-02-16: qty 5

## 2016-02-16 MED ORDER — LACTATED RINGERS IV SOLN
INTRAVENOUS | Status: DC | PRN
  Administered 2016-02-16: 08:00:00 via INTRAVENOUS

## 2016-02-16 NOTE — H&P (View-Only) (Signed)
Primary Care Physician:  Glenda Chroman, MD Primary Gastroenterologist:  Dr. Gala Romney  Pre-Procedure History & Physical: HPI:  Jerome Shepherd is a 50 y.o. male here for further evaluation of worsening GERD. Relates intermittent esophageal dysphagia to solids and liquids. Feels sometimes food just sits in the stomach for a long time after he eats. Hemoglobin A1c's in the 10 range in the past year -  now down to 6 with better glycemic control. He continues to be significantly obese. Gallbladder is out. 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 He's been taking Protonix 40 mg once daily. Also, supplements with multiple Zantac taken daily for breakthrough symptoms.  Does not use alcohol. 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  History of EGD with esophageal dilation empirically for dysphagia (no obstruction seen) 2015. Patient does not recall much improvement in dysphagia symptoms after dilation.  Past Medical History:  Diagnosis Date  . 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 CAD (coronary artery disease), minimal, non obstructive 2013 01/01/2013  . Constipation   . Coronary artery spasm ([place]) 2013  . Diabetes mellitus   . GERD (gastroesophageal reflux disease)   . Hyperlipidemia   . Hypertension   . Myocardial infarction ([place])   . Sleep apnea     Past Surgical History:  Procedure Laterality Date  . CARDIAC CATHETERIZATION  2013   non obstructive CAD  ~20%  . CHOLECYSTECTOMY N/A 04/01/2014   Procedure: LAPAROSCOPIC CHOLECYSTECTOMY;  Surgeon: Jamesetta So, MD;  Location: [facility] ORS;  Service: General;  Laterality: N/A;  . ESOPHAGOGASTRODUODENOSCOPY N/A 01/10/2014   Dr. Gala Romney: antral erosions, path with chronic inactive gastritis. Empiric dilation with 56-F dilation  . KNEE ARTHROSCOPY Right 2002  . LEFT HEART CATH N/A 08/18/2011   Procedure: LEFT HEART CATH;  Surgeon: Lorretta Harp, MD;  Location: [facility]  CATH LAB;  Service: Cardiovascular;  Laterality: N/A;  . LIVER BIOPSY N/A 04/01/2014   mild fibrosis  . MALONEY DILATION N/A 01/10/2014   Procedure: Venia Minks DILATION;  Surgeon: Daneil Dolin, MD;  Location: [facility] ENDO SUITE;  Service: Endoscopy;  Laterality: N/A;  . SAVORY DILATION N/A 01/10/2014   Procedure: SAVORY DILATION;  Surgeon: Daneil Dolin, MD;  Location: [facility] ENDO SUITE;  Service: Endoscopy;  Laterality: N/A;    Prior to Admission medications   Medication Sig Start Date End Date Taking? Authorizing Provider  amLODipine (NORVASC) 10 MG tablet Take 1 tablet (10 mg total) by mouth daily. 07/18/15  Yes Herminio Commons, MD  aspirin EC 81 MG tablet Take 1 tablet (81 mg total) by mouth daily. 11/23/15  Yes Herminio Commons, MD  Azilsartan Medoxomil (EDARBI) 40 MG TABS Take 1 tablet by mouth daily. 11/23/15  Yes Herminio Commons, MD  glimepiride (AMARYL) 4 MG tablet Take 1 tablet by mouth daily. 12/20/15  Yes Historical Provider, MD  glyBURIDE micronized (GLYNASE) 6 MG tablet Take 6 mg by mouth daily with breakfast. Reported on 12/25/2015   Yes Historical Provider, MD  ibuprofen (ADVIL,MOTRIN) 200 MG tablet Take 200 mg by mouth 2 (two) times daily as needed for moderate pain.   Yes Historical Provider, MD  isosorbide mononitrate (IMDUR) 60 MG 24 hr tablet Take 1 tablet (60 mg total) by mouth daily. 07/18/15  Yes Herminio Commons, MD  loperamide (IMODIUM A-D) 2 MG tablet Take 2 mg by mouth 4 (four) times daily as needed  for diarrhea or loose stools.   Yes Historical Provider, MD  Magnesium Gluconate 27.5 MG TABS Take 1 tablet by mouth as needed. Reported on 12/25/2015   Yes Historical Provider, MD  metFORMIN (GLUCOPHAGE) 500 MG tablet Take 500 mg by mouth daily with breakfast.    Yes Historical Provider, MD  nitroGLYCERIN (NITROSTAT) 0.4 MG SL tablet Place 1 tablet (0.4 mg total) under the tongue every 5 (five) minutes as needed for chest pain. 07/18/15  Yes Herminio Commons, MD  pantoprazole  (PROTONIX) 40 MG tablet Take 1 tablet (40 mg total) by mouth 2 (two) times daily. Patient taking differently: Take 40 mg by mouth daily.  01/18/14  Yes Lezlie Octave Black, NP  ranitidine (ZANTAC) 150 MG tablet Take 300 mg by mouth 5 (five) times daily as needed for heartburn. Taking approximately 5 times per day   Yes Historical Provider, MD  simvastatin (ZOCOR) 40 MG tablet Take 40 mg by mouth every evening.   Yes Historical Provider, MD  sitaGLIPtin (JANUVIA) 100 MG tablet Take 100 mg by mouth daily.   Yes Historical Provider, MD    Allergies as of 02/06/2016 - Review Complete 02/06/2016  Allergen Reaction Noted  . Codeine Nausea Only 08/18/2011  . Sulfa antibiotics Nausea Only 08/18/2011    Family History  Problem Relation Age of Onset  . Diabetes type II Sister   . Heart attack Cousin   . Colon cancer Neg Hx   . Stomach cancer Neg Hx     Social History   Social History  . Marital status: Single    Spouse name: N/A  . Number of children: N/A  . Years of education: N/A   Occupational History  . truck driver Product manager   Social History Main Topics  . Smoking status: Former Smoker    Quit date: 08/18/1995  . Smokeless tobacco: Never Used     Comment: Quit smoking x 20 years  . Alcohol use No  . Drug use: No  . Sexual activity: Not Currently   Other Topics Concern  . Not on file   Social History Narrative   Divorced, no children, works as a Production designer, theatre/television/film.    Review of Systems: See HPI, otherwise negative ROS  Physical Exam: BP (!) 141/81   Pulse 87   Temp 98.6 F (37 C) (Oral)   Ht 6\' 4"  (1.93 m)   Wt (!) 313 lb (142 kg)   BMI 38.10 kg/m  General:   Alert,  Well-developed, well-nourished, pleasant and cooperative in NAD Nose:  No deformity, discharge,  or lesions. Mouth:  No deformity or lesions. Neck:  Supple; no masses or thyromegaly. No significant cervical adenopathy. Lungs:  Clear throughout to auscultation.   No wheezes, crackles, or  rhonchi. No acute distress. Heart:  Regular rate and rhythm; no murmurs, clicks, rubs,  or gallops. Abdomen: Non-distended, normal bowel sounds.  Soft and nontender without appreciable mass or hepatosplenomegaly.  Pulses:  Normal pulses noted. Extremities:  Without clubbing or edema.  Impression:   Pleasant obese 50 year old gentleman diabetes and somewhat refractory GERD complains now of esophageal dysphagia to solids and sometimes liquids. Currently taking Protonix 40 mg once daily. Recent hemoglobin A1c's in the  double-digit range likely undermining efforts at treating GERD. He is significantly over weight and this may also a factor. Hx biopsy-proven [name] with grade 1 fibrosis.  He may have an underlying esophageal motility disorder. Also, gastroparesis could be an issue with the remissions and exacerbations from  time to time.  Recommendations:  I have offered the patient a diagnostic EGD with probable esophageal dilation as feasible/appropriate in the near future.  The risks, benefits, limitations, alternatives and imponderables have been reviewed with the patient. Potential for esophageal dilation, biopsy, etc. have also been reviewed.  Questions have been answered. All parties agreeable.  Continue protonix 40 daily for the time being  Increase aerobic exercise  Strive for a 25 pound weight between now and the end of the year  I - 2 cups of coffee daily will help your liver       Notice: This dictation was prepared with Dragon dictation along with smaller phrase technology. Any transcriptional errors that result from this process are unintentional and may not be corrected upon review.

## 2016-02-16 NOTE — Telephone Encounter (Signed)
Thanks

## 2016-02-16 NOTE — Telephone Encounter (Signed)
Per Dr. Gala Romney, Dexilant 60 mg #20 placed at front for pt to pick up and take one daily.

## 2016-02-16 NOTE — Telephone Encounter (Signed)
Short Stay called to say that RMR wanted Solid-phase gastric emptying study next week to evaluate for potential slow stomach emptying.  I had to transfer call to GF VM.

## 2016-02-16 NOTE — Transfer of Care (Signed)
Immediate Anesthesia Transfer of Care Note  Patient: Jerome Shepherd  Procedure(s) Performed: Procedure(s) with comments: ESOPHAGOGASTRODUODENOSCOPY (EGD) WITH PROPOFOL (N/A) - [address] (N/A)  Patient Location: PACU  Anesthesia Type:MAC  Level of Consciousness: awake and patient cooperative  Airway & Oxygen Therapy: Patient Spontanous Breathing and Patient connected to face mask oxygen  Post-op Assessment: Report given to RN, Post -op Vital signs reviewed and stable and Patient moving all extremities  Post vital signs: Reviewed and stable  Last Vitals:  Vitals:   02/16/16 0816  Pulse: 72  Resp: 18  Temp: 36.9 C    Last Pain:  Vitals:   02/16/16 0816  TempSrc: Oral      Patients Stated Pain Goal: 8 ([id number])  Complications: No apparent anesthesia complications

## 2016-02-16 NOTE — Anesthesia Postprocedure Evaluation (Signed)
Anesthesia Post Note  Patient: Jerome Shepherd  Procedure(s) Performed: Procedure(s) (LRB): ESOPHAGOGASTRODUODENOSCOPY (EGD) WITH PROPOFOL (N/A) MALONEY DILATION (N/A)  Patient location during evaluation: PACU Anesthesia Type: MAC Level of consciousness: awake, oriented and patient cooperative Pain management: pain level controlled Vital Signs Assessment: post-procedure vital signs reviewed and stable Respiratory status: spontaneous breathing, nonlabored ventilation and respiratory function stable Cardiovascular status: blood pressure returned to baseline Postop Assessment: no signs of nausea or vomiting Anesthetic complications: no    Last Vitals:  Vitals:   02/16/16 0816  Pulse: 72  Resp: 18  Temp: 36.9 C    Last Pain:  Vitals:   02/16/16 0816  TempSrc: Oral                 Seniyah Esker J

## 2016-02-16 NOTE — Discharge Instructions (Addendum)
Gastroesophageal Reflux Disease, Adult Normally, food travels down the esophagus and stays in the stomach to be digested. However, when a person has gastroesophageal reflux disease (GERD), food and stomach acid move back up into the esophagus. When this happens, the esophagus becomes sore and inflamed. Over time, GERD can create small holes (ulcers) in the lining of the esophagus.  CAUSES This condition is caused by a problem with the muscle between the esophagus and the stomach (lower esophageal sphincter, or LES). Normally, the LES muscle closes after food passes through the esophagus to the stomach. When the LES is weakened or abnormal, it does not close properly, and that allows food and stomach acid to go back up into the esophagus. The LES can be weakened by certain dietary substances, medicines, and medical conditions, including:  Tobacco use.  Pregnancy.  Having a hiatal hernia.  Heavy alcohol use.  Certain foods and beverages, such as coffee, chocolate, onions, and peppermint. RISK FACTORS This condition is more likely to develop in:  People who have an increased body weight.  People who have connective tissue disorders.  People who use NSAID medicines. SYMPTOMS Symptoms of this condition include:  Heartburn.  Difficult or painful swallowing.  The feeling of having a lump in the throat.  Abitter taste in the mouth.  Bad breath.  Having a large amount of saliva.  Having an upset or bloated stomach.  Belching.  Chest pain.  Shortness of breath or wheezing.  Ongoing (chronic) cough or a night-time cough.  Wearing away of tooth enamel.  Weight loss. Different conditions can cause chest pain. Make sure to see your health care provider if you experience chest pain. DIAGNOSIS Your health care provider will take a medical history and perform a physical exam. To determine if you have mild or severe GERD, your health care provider may also monitor how you  respond to treatment. You may also have other tests, including:  An endoscopy toexamine your stomach and esophagus with a small camera.  A test thatmeasures the acidity level in your esophagus.  A test thatmeasures how much pressure is on your esophagus.  A barium swallow or modified barium swallow to show the shape, size, and functioning of your esophagus. TREATMENT The goal of treatment is to help relieve your symptoms and to prevent complications. Treatment for this condition may vary depending on how severe your symptoms are. Your health care provider may recommend:  Changes to your diet.  Medicine.  Surgery. HOME CARE INSTRUCTIONS Diet  Follow a diet as recommended by your health care provider. This may involve avoiding foods and drinks such as:  Coffee and tea (with or without caffeine).  Drinks that containalcohol.  Energy drinks and sports drinks.  Carbonated drinks or sodas.  Chocolate and cocoa.  Peppermint and mint flavorings.  Garlic and onions.  Horseradish.  Spicy and acidic foods, including peppers, chili powder, curry powder, vinegar, hot sauces, and barbecue sauce.  Citrus fruit juices and citrus fruits, such as oranges, lemons, and limes.  Tomato-based foods, such as red sauce, chili, salsa, and pizza with red sauce.  Fried and fatty foods, such as donuts, french fries, potato chips, and high-fat dressings.  High-fat meats, such as hot dogs and fatty cuts of red and white meats, such as rib eye steak, sausage, ham, and bacon.  High-fat dairy items, such as whole milk, butter, and cream cheese.  Eat small, frequent meals instead of large meals.  Avoid drinking large amounts of liquid with  your meals.  Avoid eating meals during the 2-3 hours before bedtime.  Avoid lying down right after you eat.  Do not exercise right after you eat. General Instructions  Pay attention to any changes in your symptoms.  Take over-the-counter and  prescription medicines only as told by your health care provider. Do not take aspirin, ibuprofen, or other NSAIDs unless your health care provider told you to do so.  Do not use any tobacco products, including cigarettes, chewing tobacco, and e-cigarettes. If you need help quitting, ask your health care provider.  Wear loose-fitting clothing. Do not wear anything tight around your waist that causes pressure on your abdomen.  Raise (elevate) the head of your bed 6 inches (15cm).  Try to reduce your stress, such as with yoga or meditation. If you need help reducing stress, ask your health care provider.  If you are overweight, reduce your weight to an amount that is healthy for you. Ask your health care provider for guidance about a safe weight loss goal.  Keep all follow-up visits as told by your health care provider. This is important. SEEK MEDICAL CARE IF:  You have new symptoms.  You have unexplained weight loss.  You have difficulty swallowing, or it hurts to swallow.  You have wheezing or a persistent cough.  Your symptoms do not improve with treatment.  You have a hoarse voice. SEEK IMMEDIATE MEDICAL CARE IF:  You have pain in your arms, neck, jaw, teeth, or back.  You feel sweaty, dizzy, or light-headed.  You have chest pain or shortness of breath.  You vomit and your vomit looks like blood or coffee grounds.  You faint.  Your stool is bloody or black.  You cannot swallow, drink, or eat.   This information is not intended to replace advice given to you by your health care provider. Make sure you discuss any questions you have with your health care provider.   Document Released: 03/20/2005 Document Revised: 03/01/2015 Document Reviewed: 10/05/2014 Elsevier Interactive Patient Education 2016 [employer] EGD Discharge instructions Please read the instructions outlined below and refer to this sheet in the next few weeks. These discharge instructions provide you  with general information on caring for yourself after you leave the hospital. Your doctor may also give you specific instructions. While your treatment has been planned according to the most current medical practices available, unavoidable complications occasionally occur. If you have any problems or questions after discharge, please call your doctor. ACTIVITY  You may resume your regular activity but move at a slower pace for the next 24 hours.   Take frequent rest periods for the next 24 hours.   Walking will help expel (get rid of) the air and reduce the bloated feeling in your abdomen.   No driving for 24 hours (because of the anesthesia (medicine) used during the test).   You may shower.   Do not sign any important legal documents or operate any machinery for 24 hours (because of the anesthesia used during the test).  NUTRITION  Drink plenty of fluids.   You may resume your normal diet.   Begin with a light meal and progress to your normal diet.   Avoid alcoholic beverages for 24 hours or as instructed by your caregiver.  MEDICATIONS  You may resume your normal medications unless your caregiver tells you otherwise.  WHAT YOU CAN EXPECT TODAY  You may experience abdominal discomfort such as a feeling of fullness or gas pains.  FOLLOW-UP  Your doctor will discuss the results of your test with you.  SEEK IMMEDIATE MEDICAL ATTENTION IF ANY OF THE FOLLOWING OCCUR:  Excessive nausea (feeling sick to your stomach) and/or vomiting.   Severe abdominal pain and distention (swelling).   Trouble swallowing.   Temperature over 101 F (37.8 C).   Rectal bleeding or vomiting of blood.   GERD information provided  For now, stop Protonix; begin Dexilant 60 mg daily-go to the office for a 3 week supply of samples. Let me know how you're doing after you have been on it for 3 weeks  Solid-phase gastric emptying study next week to evaluate for potential slow stomach  emptying  Further recommendations to follow.

## 2016-02-16 NOTE — Interval H&P Note (Signed)
History and Physical Interval Note:  02/16/2016 8:46 AM  Jerome Shepherd  has presented today for surgery, with the diagnosis of DYSPHAGIA/GERD  The various methods of treatment have been discussed with the patient and family. After consideration of risks, benefits and other options for treatment, the patient has consented to  Procedure(s) with comments: ESOPHAGOGASTRODUODENOSCOPY (EGD) WITH PROPOFOL (N/A) - [address] [place] (N/A) as a surgical intervention .  The patient's history has been reviewed, patient examined, no change in status, stable for surgery.  I have reviewed the patient's chart and labs.  Questions were answered to the patient's satisfaction.     Jerome Shepherd  No change; EGD w ED today per plan.  The risks, benefits, limitations, alternatives and imponderables have been reviewed with the patient. Potential for esophageal dilation, biopsy, etc. have also been reviewed.  Questions have been answered. All parties agreeable.

## 2016-02-16 NOTE — Op Note (Signed)
[facility] Patient Name: Jerome Shepherd Procedure Date: 02/16/2016 9:00 AM MRN: [id number] Date of Birth: [birth year] Attending MD: Norvel Richards , MD CSN: [id number] Age: 50 Admit Type: Outpatient Procedure:                Upper GI endoscopy with Venia Minks dilation Indications:              Dysphagia Providers:                Norvel Richards, MD, Janeece Riggers, RN, Shelby Mattocks, Technician Referring MD:              Medicines:                Propofol per Anesthesia Complications:            No immediate complications. Estimated Blood Loss:     Estimated blood loss: none. Procedure:                Pre-Anesthesia Assessment:                           - Prior to the procedure, a History and Physical                            was performed, and patient medications and                            allergies were reviewed. The patient's tolerance of                            previous anesthesia was also reviewed. The risks                            and benefits of the procedure and the sedation                            options and risks were discussed with the patient.                            All questions were answered, and informed consent                            was obtained. Prior Anticoagulants: The patient has                            taken no previous anticoagulant or antiplatelet                            agents. ASA Grade Assessment: II - A patient with                            mild systemic disease. After reviewing the risks  and benefits, the patient was deemed in                            satisfactory condition to undergo the procedure.                           After obtaining informed consent, the endoscope was                            passed under direct vision. Throughout the                            procedure, the patient's blood pressure, pulse, and                            oxygen  saturations were monitored continuously. The                            [id number] scope was introduced through the                            mouth, and advanced to the second part of duodenum.                            The upper GI endoscopy was accomplished without                            difficulty. The patient tolerated the procedure                            well. Scope In: [id number] AM Scope Out: 9:12:13 AM Total Procedure Duration: 0 hours 5 minutes 55 seconds  Findings:      The examined esophagus was normal. The scope was withdrawn. Dilation was       performed with a Maloney dilator with no resistance at 56 Fr. The scope       was withdrawn. Dilation was performed with a Maloney dilator with no       resistance at 104 Fr. The dilation site was examined following endoscope       reinsertion and showed no change. Estimated blood loss: none.      A small hiatal hernia was present.      The exam was otherwise without abnormality.      The duodenal bulb and second portion of the duodenum were normal. Impression:               - Normal esophagus. Dilated.                           - Small hiatal hernia.                           - The examination was otherwise normal.                           - Normal duodenal bulb and second portion of the  duodenum.                           - No specimens collected. Moderate Sedation:      Moderate (conscious) sedation was personally administered by an       anesthesia professional. The following parameters were monitored: oxygen       saturation, heart rate, blood pressure, respiratory rate, EKG, adequacy       of pulmonary ventilation, and response to care. Total physician       intraservice time was 19 minutes. Recommendation:           - Patient has a contact number available for                            emergencies. The signs and symptoms of potential                            delayed complications  were discussed with the                            patient. Return to normal activities tomorrow.                            Written discharge instructions were provided to the                            patient.                           - Advance diet as tolerated.                           - Continue present medications.                           - No repeat upper endoscopy.                           - Return to GI office after studies are complete.                            Stop Protonix; Begin Dexilant 60 mg daily. GES next                            week Procedure Code(s):        --- Professional ---                           [phone], Esophagogastroduodenoscopy, flexible,                            transoral; diagnostic, including collection of                            specimen(s) by brushing or washing, when performed                            (  separate procedure)                           43450, Dilation of esophagus, by unguided sound or                            bougie, single or multiple passes Diagnosis Code(s):        --- Professional ---                           K44.9, Diaphragmatic hernia without obstruction or                            gangrene                           R13.10, Dysphagia, unspecified CPT copyright 2016 [employer]. All rights reserved. The codes documented in this report are preliminary and upon coder review may  be revised to meet current compliance requirements. Jerome Shepherd. Jerome Jagielski, MD Norvel Richards, MD 02/16/2016 9:33:13 AM This report has been signed electronically. Number of Addenda: 0

## 2016-02-19 ENCOUNTER — Other Ambulatory Visit: Payer: Self-pay

## 2016-02-19 ENCOUNTER — Telehealth: Payer: Self-pay

## 2016-02-19 DIAGNOSIS — R1310 Dysphagia, unspecified: Secondary | ICD-10-CM

## 2016-02-19 NOTE — Telephone Encounter (Signed)
Communication noted.  

## 2016-02-19 NOTE — Telephone Encounter (Signed)
Called pt to inform of Gastric Emptying being scheduled for 02/22/16 at 8:00am. Instructions given.

## 2016-02-19 NOTE — Telephone Encounter (Signed)
Called pt to inform of Gastric Emptying being sch for 02/22/16. Instructions given. Pt said Dexilant 60mg  po daily didn't help and made him nauseated, no vomiting. He restarted Protonix.

## 2016-02-21 ENCOUNTER — Other Ambulatory Visit (HOSPITAL_COMMUNITY): Payer: No Typology Code available for payment source

## 2016-02-21 ENCOUNTER — Encounter (HOSPITAL_COMMUNITY): Payer: Self-pay | Admitting: Internal Medicine

## 2016-02-22 ENCOUNTER — Encounter (HOSPITAL_COMMUNITY): Payer: Self-pay

## 2016-02-22 ENCOUNTER — Encounter (HOSPITAL_COMMUNITY)
Admission: RE | Admit: 2016-02-22 | Discharge: 2016-02-22 | Disposition: A | Discharge status: Home / Self Care | Payer: No Typology Code available for payment source | Source: Ambulatory Visit | Attending: Internal Medicine | Admitting: Internal Medicine

## 2016-02-22 DIAGNOSIS — R1310 Dysphagia, unspecified: Secondary | ICD-10-CM | POA: Diagnosis not present

## 2016-02-22 MED ORDER — TECHNETIUM TC 99M SULFUR COLLOID
2.0000 | Freq: Once | INTRAVENOUS | Status: AC | PRN
  Administered 2016-02-22: 2 via ORAL

## 2016-02-29 ENCOUNTER — Encounter: Payer: Self-pay | Admitting: Internal Medicine

## 2016-02-29 ENCOUNTER — Telehealth: Payer: Self-pay | Admitting: Internal Medicine

## 2016-02-29 NOTE — Telephone Encounter (Signed)
Tried to call pt- [facility] with results. See result note from Rayle.

## 2016-02-29 NOTE — Telephone Encounter (Signed)
PLEASE CALL PATIENT ABOUT HIS RESULTS FROM HIS TESTS LAST WEEK

## 2016-03-03 ENCOUNTER — Encounter (HOSPITAL_COMMUNITY): Payer: Self-pay | Admitting: Emergency Medicine

## 2016-03-03 ENCOUNTER — Emergency Department (HOSPITAL_COMMUNITY): Payer: No Typology Code available for payment source

## 2016-03-03 ENCOUNTER — Emergency Department (HOSPITAL_COMMUNITY)
Admission: EM | Admit: 2016-03-03 | Discharge: 2016-03-03 | Disposition: A | Discharge status: Home / Self Care | Payer: No Typology Code available for payment source | Attending: Emergency Medicine | Admitting: Emergency Medicine

## 2016-03-03 DIAGNOSIS — Z79899 Other long term (current) drug therapy: Secondary | ICD-10-CM | POA: Insufficient documentation

## 2016-03-03 DIAGNOSIS — R52 Pain, unspecified: Secondary | ICD-10-CM

## 2016-03-03 DIAGNOSIS — Z7982 Long term (current) use of aspirin: Secondary | ICD-10-CM | POA: Insufficient documentation

## 2016-03-03 DIAGNOSIS — Z87891 Personal history of nicotine dependence: Secondary | ICD-10-CM | POA: Insufficient documentation

## 2016-03-03 DIAGNOSIS — Z7984 Long term (current) use of oral hypoglycemic drugs: Secondary | ICD-10-CM | POA: Insufficient documentation

## 2016-03-03 DIAGNOSIS — I2510 Atherosclerotic heart disease of native coronary artery without angina pectoris: Secondary | ICD-10-CM | POA: Diagnosis not present

## 2016-03-03 DIAGNOSIS — I10 Essential (primary) hypertension: Secondary | ICD-10-CM | POA: Diagnosis not present

## 2016-03-03 DIAGNOSIS — Z791 Long term (current) use of non-steroidal anti-inflammatories (NSAID): Secondary | ICD-10-CM | POA: Insufficient documentation

## 2016-03-03 DIAGNOSIS — N390 Urinary tract infection, site not specified: Secondary | ICD-10-CM | POA: Diagnosis not present

## 2016-03-03 DIAGNOSIS — E119 Type 2 diabetes mellitus without complications: Secondary | ICD-10-CM | POA: Insufficient documentation

## 2016-03-03 DIAGNOSIS — R1030 Lower abdominal pain, unspecified: Secondary | ICD-10-CM | POA: Diagnosis present

## 2016-03-03 LAB — COMPREHENSIVE METABOLIC PANEL
ALBUMIN: 4.7 g/dL (ref 3.5–5.0)
ALK PHOS: 63 U/L (ref 38–126)
ALT: 21 U/L (ref 17–63)
ANION GAP: 4 — AB (ref 5–15)
AST: 33 U/L (ref 15–41)
BILIRUBIN TOTAL: 0.8 mg/dL (ref 0.3–1.2)
BUN: 13 mg/dL (ref 6–20)
CALCIUM: 9.3 mg/dL (ref 8.9–10.3)
CO2: 27 mmol/L (ref 22–32)
Chloride: 106 mmol/L (ref 101–111)
Creatinine, Ser: 1.18 mg/dL (ref 0.61–1.24)
GFR calc Af Amer: >60 mL/min (ref >60)
GLUCOSE: 121 mg/dL — AB (ref 65–99)
Potassium: 3.8 mmol/L (ref 3.5–5.1)
Sodium: 137 mmol/L (ref 135–145)
TOTAL PROTEIN: 7.8 g/dL (ref 6.5–8.1)

## 2016-03-03 LAB — CBC WITH DIFFERENTIAL/PLATELET
BASOS PCT: 0 %
Basophils Absolute: 0 10*3/uL (ref 0.0–0.1)
Eosinophils Absolute: 0.1 10*3/uL (ref 0.0–0.7)
Eosinophils Relative: 0 %
HEMATOCRIT: 44.3 % (ref 39.0–52.0)
HEMOGLOBIN: 16.1 g/dL (ref 13.0–17.0)
LYMPHS ABS: 1.8 10*3/uL (ref 0.7–4.0)
LYMPHS PCT: 12 %
MCH: 32.2 pg (ref 26.0–34.0)
MCHC: 36.3 g/dL — AB (ref 30.0–36.0)
MCV: 88.6 fL (ref 78.0–100.0)
MONO ABS: 1 10*3/uL (ref 0.1–1.0)
MONOS PCT: 7 %
NEUTROS ABS: 12.2 10*3/uL — AB (ref 1.7–7.7)
NEUTROS PCT: 81 %
Platelets: 230 10*3/uL (ref 150–400)
RBC: 5 MIL/uL (ref 4.22–5.81)
RDW: 12.1 % (ref 11.5–15.5)
WBC: 15 10*3/uL — ABNORMAL HIGH (ref 4.0–10.5)

## 2016-03-03 LAB — URINALYSIS, ROUTINE W REFLEX MICROSCOPIC
BILIRUBIN URINE: NEGATIVE
GLUCOSE, UA: 100 mg/dL — AB
KETONES UR: NEGATIVE mg/dL
Leukocytes, UA: NEGATIVE
Nitrite: NEGATIVE
PH: 7.5 (ref 5.0–8.0)
PROTEIN: NEGATIVE mg/dL
Specific Gravity, Urine: 1.02 (ref 1.005–1.030)

## 2016-03-03 LAB — URINE MICROSCOPIC-ADD ON

## 2016-03-03 MED ORDER — HYDROMORPHONE HCL 1 MG/ML IJ SOLN
1.0000 mg | Freq: Once | INTRAMUSCULAR | Status: AC
  Administered 2016-03-03: 1 mg via INTRAVENOUS
  Filled 2016-03-03: qty 1

## 2016-03-03 MED ORDER — CEPHALEXIN 500 MG PO CAPS: 500.0000 mg | ORAL_CAPSULE | Freq: Four times a day (QID) | ORAL | 0 refills | Status: DC

## 2016-03-03 MED ORDER — HYDROCODONE-ACETAMINOPHEN 5-325 MG PO TABS: 1.0000 | ORAL_TABLET | Freq: Four times a day (QID) | ORAL | 0 refills | Status: DC | PRN

## 2016-03-03 MED ORDER — DEXTROSE 5 % IV SOLN
1.0000 g | Freq: Once | INTRAVENOUS | Status: AC
  Administered 2016-03-03: 1 g via INTRAVENOUS
  Filled 2016-03-03: qty 10

## 2016-03-03 MED ORDER — SODIUM CHLORIDE 0.9 % IV BOLUS (SEPSIS)
1000.0000 mL | Freq: Once | INTRAVENOUS | Status: AC
  Administered 2016-03-03: 1000 mL via INTRAVENOUS

## 2016-03-03 MED ORDER — ONDANSETRON 4 MG PO TBDP: ORAL_TABLET | ORAL | 0 refills | Status: DC

## 2016-03-03 MED ORDER — ONDANSETRON HCL 4 MG/2ML IJ SOLN
4.0000 mg | Freq: Once | INTRAMUSCULAR | Status: AC
  Administered 2016-03-03: 4 mg via INTRAVENOUS
  Filled 2016-03-03: qty 2

## 2016-03-03 NOTE — Discharge Instructions (Signed)
Follow up with your md later this week for recheck °

## 2016-03-03 NOTE — ED Notes (Signed)
Pt states he feels like his bladder is full but only voiding small amounts each time. Bladder scanner only shows a scant amount of urine.

## 2016-03-03 NOTE — ED Notes (Signed)
Patient verbalizes understanding of discharge instructions, prescriptions, home care and follow up care. Patient out of department at this time. 

## 2016-03-03 NOTE — ED Notes (Signed)
Pt to CT at this time.

## 2016-03-07 LAB — URINE CULTURE: Culture: >=100000 — AB

## 2016-03-08 ENCOUNTER — Telehealth (HOSPITAL_BASED_OUTPATIENT_CLINIC_OR_DEPARTMENT_OTHER): Payer: Self-pay

## 2016-03-08 NOTE — ED Provider Notes (Signed)
[facility] DEPT Provider Note   CSN: [id number] Arrival date & time: 03/03/16  1551     History   Chief Complaint Chief Complaint  Patient presents with  . Urinary Retention    HPI Jerome Shepherd is a 50 y.o. male.  Patient Foley catheter and complains of suprapubic abdominal pain. He has had UTIs before    Abdominal Pain   This is a new problem. The current episode started 12 to 24 hours ago. The problem occurs constantly. The problem has not changed since onset.Associated with: Nothing. The pain is located in the suprapubic region. The pain is at a severity of 3/10. The pain is moderate. Associated symptoms include anorexia. Pertinent negatives include diarrhea, frequency, hematuria and headaches. Nothing aggravates the symptoms. Nothing relieves the symptoms. Past workup does not include GI consult. His past medical history does not include PUD.    Past Medical History:  Diagnosis Date  . 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 CAD (coronary artery disease), minimal, non obstructive 2013 01/01/2013  . Constipation   . 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 Diabetes mellitus   . GERD (gastroesophageal reflux disease)   . Hyperlipidemia   . Hypertension   . Myocardial infarction [facility])    2012    Patient Active Problem List   Diagnosis Date Noted  . 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 Esophageal dysphagia, with solids only, leading to chest pain 01/22/2013  . HTN (hypertension) 01/22/2013  . 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 Chest pain 01/01/2013  . CAD (coronary artery disease), minimal, non obstructive 2013 01/01/2013  . GERD (gastroesophageal reflux disease) 01/01/2013  . Abnormal EKG, ?st elevation reported at OSH 08/18/2011  . Diabetes mellitus ([place]) 08/18/2011  . Obesity 08/18/2011    Past Surgical History:  Procedure Laterality Date  . CARDIAC CATHETERIZATION  2013   non obstructive CAD  ~20%  . CHOLECYSTECTOMY  N/A 04/01/2014   Procedure: LAPAROSCOPIC CHOLECYSTECTOMY;  Surgeon: Jamesetta So, MD;  Location: [facility] ORS;  Service: General;  Laterality: N/A;  . ESOPHAGOGASTRODUODENOSCOPY N/A 01/10/2014   Dr. Gala Romney: antral erosions, path with chronic inactive gastritis. Empiric dilation with 56-F dilation  . ESOPHAGOGASTRODUODENOSCOPY (EGD) WITH PROPOFOL N/A 02/16/2016   Procedure: ESOPHAGOGASTRODUODENOSCOPY (EGD) WITH PROPOFOL;  Surgeon: Daneil Dolin, MD;  Location: [facility] ENDO SUITE;  Service: Endoscopy;  Laterality: N/A;  915  . KNEE ARTHROSCOPY Right 2002  . LEFT HEART CATH N/A 08/18/2011   Procedure: LEFT HEART CATH;  Surgeon: Lorretta Harp, MD;  Location: [facility] CATH LAB;  Service: Cardiovascular;  Laterality: N/A;  . LIVER BIOPSY N/A 04/01/2014   mild fibrosis  . MALONEY DILATION N/A 01/10/2014   Procedure: Venia Minks DILATION;  Surgeon: Daneil Dolin, MD;  Location: [facility] ENDO SUITE;  Service: Endoscopy;  Laterality: N/A;  Venia Minks DILATION N/A 02/16/2016   Procedure: Venia Minks DILATION;  Surgeon: Daneil Dolin, MD;  Location: [facility] ENDO SU[facility];  Service: Endoscopy;  Laterality: N/A;  . SAVORY DILATION N/A 01/10/2014   Procedure: SAVORY DILATION;  Surgeon: Daneil Dolin, MD;  Location: [facility] ENDO SU[facility];  Service: Endoscopy;  Laterality: N/A;       Home Medications    Prior to Admission medications   Medication Sig Start Date End Date Taking? Authorizing Provider  amLODipine (NORVASC) 10 MG tablet Take 1 tablet (10 mg total) by mouth daily. 07/18/15  Yes Herminio Commons, MD  aspirin EC 81 MG tablet  Take 1 tablet (81 mg total) by mouth daily. 11/23/15  Yes Herminio Commons, MD  Azilsartan Medoxomil (EDARBI) 40 MG TABS Take 1 tablet by mouth daily. 11/23/15  Yes Herminio Commons, MD  glimepiride (AMARYL) 4 MG tablet Take 1 tablet by mouth daily. 12/20/15  Yes Historical Provider, MD  glyBURIDE micronized (GLYNASE) 6 MG tablet Take 6 mg by mouth daily with breakfast. Reported on 12/25/2015   Yes Historical Provider,  MD  ibuprofen (ADVIL,MOTRIN) 200 MG tablet Take 200 mg by mouth 2 (two) times daily as needed for moderate pain.   Yes Historical Provider, MD  isosorbide mononitrate (IMDUR) 60 MG 24 hr tablet Take 1 tablet (60 mg total) by mouth daily. 07/18/15  Yes Herminio Commons, MD  loperamide (IMODIUM A-D) 2 MG tablet Take 2 mg by mouth 4 (four) times daily as needed for diarrhea or loose stools.   Yes Historical Provider, MD  Magnesium Gluconate 27.5 MG TABS Take 1 tablet by mouth daily as needed (for cramps). Reported on 12/25/2015   Yes Historical Provider, MD  metFORMIN (GLUCOPHAGE) 500 MG tablet Take 500 mg by mouth daily with breakfast.    Yes Historical Provider, MD  nitroGLYCERIN (NITROSTAT) 0.4 MG SL tablet Place 1 tablet (0.4 mg total) under the tongue every 5 (five) minutes as needed for chest pain. 07/18/15  Yes Herminio Commons, MD  pantoprazole (PROTONIX) 40 MG tablet TAKE 1 TABLET BY MOUTH EVERY DAY (CANCEL OMEPRAZOLE) 02/20/16  Yes Historical Provider, MD  ranitidine (ZANTAC) 150 MG tablet Take 300 mg by mouth 5 (five) times daily as needed for heartburn. Taking approximately 5 times per day   Yes Historical Provider, MD  simvastatin (ZOCOR) 40 MG tablet Take 40 mg by mouth every evening.   Yes Historical Provider, MD  sitaGLIPtin (JANUVIA) 100 MG tablet Take 100 mg by mouth daily.   Yes Historical Provider, MD  cephALEXin (KEFLEX) 500 MG capsule Take 1 capsule (500 mg total) by mouth 4 (four) times daily. 03/03/16   Milton Ferguson, MD  HYDROcodone-acetaminophen (NORCO/VICODIN) 5-325 MG tablet Take 1 tablet by mouth every 6 (six) hours as needed. 03/03/16   Milton Ferguson, MD  ondansetron (ZOFRAN ODT) 4 MG disintegrating tablet 4mg  ODT q4 hours prn nausea/vomit 03/03/16   Milton Ferguson, MD    Family History Family History  Problem Relation Age of Onset  . Diabetes type II Sister   . Heart attack Cousin   . Colon cancer Neg Hx   . Stomach cancer Neg Hx     Social History Social History    Substance Use Topics  . Smoking status: Former Smoker    Quit date: 08/18/1995  . Smokeless tobacco: Never Used     Comment: Quit smoking x 20 years  . Alcohol use No     Allergies   Codeine and Sulfa antibiotics   Review of Systems Review of Systems  Constitutional: Negative for appetite change and fatigue.  HENT: Negative for congestion, ear discharge and sinus pressure.   Eyes: Negative for discharge.  Respiratory: Negative for cough.   Cardiovascular: Negative for chest pain.  Gastrointestinal: Positive for abdominal pain and anorexia. Negative for diarrhea.  Genitourinary: Negative for frequency and hematuria.  Musculoskeletal: Negative for back pain.  Skin: Negative for rash.  Neurological: Negative for seizures and headaches.  Psychiatric/Behavioral: Negative for hallucinations.     Physical Exam Updated Vital Signs BP 132/56 (BP Location: Left Arm)   Pulse 94   Temp 99.1 F (37.3  C) (Oral)   Resp 18   Ht 6\' 4"  (1.93 m)   Wt (!) 320 lb (145.2 kg)   SpO2 98%   BMI 38.95 kg/m   Physical Exam  Constitutional: He is oriented to person, place, and time. He appears well-developed.  HENT:  Head: Normocephalic.  Eyes: Conjunctivae and EOM are normal. No scleral icterus.  Neck: Neck supple. No thyromegaly present.  Cardiovascular: Normal rate and regular rhythm.  Exam reveals no gallop and no friction rub.   No murmur heard. Pulmonary/Chest: No stridor. He has no wheezes. He has no rales. He exhibits no tenderness.  Abdominal: He exhibits no distension. There is no tenderness. There is no rebound.  Tender suprapubic  Musculoskeletal: Normal range of motion. He exhibits no edema.  Lymphadenopathy:    He has no cervical adenopathy.  Neurological: He is oriented to person, place, and time. He exhibits normal muscle tone. Coordination normal.  Skin: No rash noted. No erythema.  Psychiatric: He has a normal mood and affect. His behavior is normal.     ED  Treatments / Results  Labs (all labs ordered are listed, but only abnormal results are displayed) Labs Reviewed  URINE CULTURE - Abnormal; Notable for the following:       Result Value   Culture   (*)    Value: >=100,000 COLONIES/mL ESCHERICHIA COLI Confirmed Extended Spectrum Beta-Lactamase Producer (ESBL) Performed at [facility]    Organism ID, Bacteria ESCHERICHIA COLI (*)    All other components within normal limits  URINALYSIS, ROUTINE W REFLEX MICROSCOPIC (NOT AT [facility]) - Abnormal; Notable for the following:    Glucose, UA 100 (*)    Hgb urine dipstick SMALL (*)    All other components within normal limits  URINE MICROSCOPIC-ADD ON - Abnormal; Notable for the following:    Squamous Epithelial / LPF 0-5 (*)    Bacteria, UA FEW (*)    All other components within normal limits  CBC WITH DIFFERENTIAL/PLATELET - Abnormal; Notable for the following:    WBC 15.0 (*)    MCHC 36.3 (*)    Neutro Abs 12.2 (*)    All other components within normal limits  COMPREHENSIVE METABOLIC PANEL - Abnormal; Notable for the following:    Glucose, Bld 121 (*)    Anion gap 4 (*)    All other components within normal limits    EKG  EKG Interpretation None       Radiology No results found.  Procedures Procedures (including critical care time)  Medications Ordered in ED Medications  cefTRIAXone (ROCEPHIN) 1 g in dextrose 5 % 50 mL IVPB (0 g Intravenous Stopped 03/03/16 2120)  sodium chloride 0.9 % bolus 1,000 mL (0 mLs Intravenous Stopped 03/03/16 2132)  HYDROmorphone (DILAUDID) injection 1 mg (1 mg Intravenous Given 03/03/16 2118)  ondansetron (ZOFRAN) injection 4 mg (4 mg Intravenous Given 03/03/16 2117)     Initial Impression / Assessment and Plan / ED Course  I have reviewed the triage vital signs and the nursing notes.  Pertinent labs & imaging results that were available during my care of the patient were reviewed by me and considered in my medical decision making (see  chart for details).  Clinical Course   Patient with urinary tract infection will be sent home on Keflex   Final Clinical Impressions(s) / ED Diagnoses   Final diagnoses:  Pain  UTI (lower urinary tract infection)    New Prescriptions Discharge Medication List as of 03/03/2016  9:15  PM    START taking these medications   Details  cephALEXin (KEFLEX) 500 MG capsule Take 1 capsule (500 mg total) by mouth 4 (four) times daily., Starting Sun 03/03/2016, Print    HYDROcodone-acetaminophen (NORCO/VICODIN) 5-325 MG tablet Take 1 tablet by mouth every 6 (six) hours as needed., Starting Sun 03/03/2016, Print    ondansetron (ZOFRAN ODT) 4 MG disintegrating tablet 4mg  ODT q4 hours prn nausea/vomit, Print         Milton Ferguson, MD 03/08/16 1727

## 2016-03-10 ENCOUNTER — Emergency Department (HOSPITAL_COMMUNITY): Payer: No Typology Code available for payment source

## 2016-03-10 ENCOUNTER — Emergency Department (HOSPITAL_COMMUNITY)
Admission: EM | Admit: 2016-03-10 | Discharge: 2016-03-11 | Disposition: A | Discharge status: Home / Self Care | Payer: No Typology Code available for payment source | Source: Home / Self Care | Attending: Emergency Medicine | Admitting: Emergency Medicine

## 2016-03-10 ENCOUNTER — Encounter (HOSPITAL_COMMUNITY): Payer: Self-pay

## 2016-03-10 ENCOUNTER — Encounter (HOSPITAL_COMMUNITY): Payer: Self-pay | Admitting: *Deleted

## 2016-03-10 ENCOUNTER — Emergency Department (HOSPITAL_COMMUNITY)
Admission: EM | Admit: 2016-03-10 | Discharge: 2016-03-10 | Disposition: A | Discharge status: Home / Self Care | Payer: No Typology Code available for payment source | Source: Home / Self Care | Attending: Emergency Medicine | Admitting: Emergency Medicine

## 2016-03-10 DIAGNOSIS — Z79899 Other long term (current) drug therapy: Secondary | ICD-10-CM | POA: Insufficient documentation

## 2016-03-10 DIAGNOSIS — I10 Essential (primary) hypertension: Secondary | ICD-10-CM | POA: Insufficient documentation

## 2016-03-10 DIAGNOSIS — E119 Type 2 diabetes mellitus without complications: Secondary | ICD-10-CM | POA: Insufficient documentation

## 2016-03-10 DIAGNOSIS — Z7982 Long term (current) use of aspirin: Secondary | ICD-10-CM

## 2016-03-10 DIAGNOSIS — R109 Unspecified abdominal pain: Secondary | ICD-10-CM

## 2016-03-10 DIAGNOSIS — I2510 Atherosclerotic heart disease of native coronary artery without angina pectoris: Secondary | ICD-10-CM

## 2016-03-10 DIAGNOSIS — Z7984 Long term (current) use of oral hypoglycemic drugs: Secondary | ICD-10-CM | POA: Insufficient documentation

## 2016-03-10 DIAGNOSIS — N12 Tubulo-interstitial nephritis, not specified as acute or chronic: Secondary | ICD-10-CM | POA: Diagnosis not present

## 2016-03-10 DIAGNOSIS — Z87891 Personal history of nicotine dependence: Secondary | ICD-10-CM

## 2016-03-10 DIAGNOSIS — B029 Zoster without complications: Secondary | ICD-10-CM | POA: Diagnosis not present

## 2016-03-10 LAB — CBC WITH DIFFERENTIAL/PLATELET
BASOS PCT: 1 %
Basophils Absolute: 0.1 10*3/uL (ref 0.0–0.1)
EOS ABS: 0.2 10*3/uL (ref 0.0–0.7)
EOS PCT: 2 %
HCT: 40.1 % (ref 39.0–52.0)
Hemoglobin: 14.6 g/dL (ref 13.0–17.0)
LYMPHS ABS: 2.9 10*3/uL (ref 0.7–4.0)
Lymphocytes Relative: 37 %
MCH: 31.7 pg (ref 26.0–34.0)
MCHC: 36.4 g/dL — AB (ref 30.0–36.0)
MCV: 87.2 fL (ref 78.0–100.0)
Monocytes Absolute: 0.5 10*3/uL (ref 0.1–1.0)
Monocytes Relative: 6 %
Neutro Abs: 4.2 10*3/uL (ref 1.7–7.7)
Neutrophils Relative %: 54 %
Platelets: 243 10*3/uL (ref 150–400)
RBC: 4.6 MIL/uL (ref 4.22–5.81)
RDW: 12.2 % (ref 11.5–15.5)
WBC: 7.8 10*3/uL (ref 4.0–10.5)

## 2016-03-10 LAB — BASIC METABOLIC PANEL
Anion gap: 8 (ref 5–15)
BUN: 18 mg/dL (ref 6–20)
CALCIUM: 9 mg/dL (ref 8.9–10.3)
CO2: 24 mmol/L (ref 22–32)
CREATININE: 1.05 mg/dL (ref 0.61–1.24)
Chloride: 105 mmol/L (ref 101–111)
GFR calc Af Amer: >60 mL/min (ref >60)
GFR calc non Af Amer: >60 mL/min (ref >60)
Glucose, Bld: 124 mg/dL — ABNORMAL HIGH (ref 65–99)
Potassium: 3.4 mmol/L — ABNORMAL LOW (ref 3.5–5.1)
SODIUM: 137 mmol/L (ref 135–145)

## 2016-03-10 LAB — URINALYSIS, ROUTINE W REFLEX MICROSCOPIC
BILIRUBIN URINE: NEGATIVE
Bilirubin Urine: NEGATIVE
Glucose, UA: 500 mg/dL — AB
Glucose, UA: NEGATIVE mg/dL
HGB URINE DIPSTICK: NEGATIVE
Hgb urine dipstick: NEGATIVE
KETONES UR: NEGATIVE mg/dL
LEUKOCYTES UA: NEGATIVE
Leukocytes, UA: NEGATIVE
NITRITE: NEGATIVE
NITRITE: NEGATIVE
PH: 6 (ref 5.0–8.0)
PROTEIN: NEGATIVE mg/dL
Protein, ur: NEGATIVE mg/dL
Specific Gravity, Urine: 1.025 (ref 1.005–1.030)
Specific Gravity, Urine: 1.01 (ref 1.005–1.030)
pH: 6.5 (ref 5.0–8.0)

## 2016-03-10 MED ORDER — DIAZEPAM 5 MG/ML IJ SOLN
5.0000 mg | Freq: Once | INTRAMUSCULAR | Status: AC
  Administered 2016-03-10: 5 mg via INTRAMUSCULAR
  Filled 2016-03-10: qty 2

## 2016-03-10 MED ORDER — HYDROMORPHONE HCL 2 MG/ML IJ SOLN: 2.0000 mg | Freq: Once | INTRAMUSCULAR | Status: DC

## 2016-03-10 MED ORDER — KETOROLAC TROMETHAMINE 60 MG/2ML IM SOLN: 60.0000 mg | Freq: Once | INTRAMUSCULAR | Status: DC

## 2016-03-10 MED ORDER — KETOROLAC TROMETHAMINE 30 MG/ML IJ SOLN
30.0000 mg | Freq: Once | INTRAMUSCULAR | Status: AC
  Administered 2016-03-10: 30 mg via INTRAVENOUS
  Filled 2016-03-10: qty 1

## 2016-03-10 MED ORDER — NAPROXEN 500 MG PO TABS: ORAL_TABLET | ORAL | 0 refills | Status: DC

## 2016-03-10 MED ORDER — CYCLOBENZAPRINE HCL 5 MG PO TABS: 5.0000 mg | ORAL_TABLET | Freq: Three times a day (TID) | ORAL | 0 refills | Status: DC | PRN

## 2016-03-10 MED ORDER — KETOROLAC TROMETHAMINE 60 MG/2ML IM SOLN
60.0000 mg | Freq: Once | INTRAMUSCULAR | Status: AC
  Administered 2016-03-10: 60 mg via INTRAMUSCULAR
  Filled 2016-03-10: qty 2

## 2016-03-10 MED ORDER — HYDROMORPHONE HCL 2 MG/ML IJ SOLN
2.0000 mg | Freq: Once | INTRAMUSCULAR | Status: AC
  Administered 2016-03-10: 2 mg via INTRAVENOUS
  Filled 2016-03-10: qty 1

## 2016-03-10 MED ORDER — ONDANSETRON 8 MG PO TBDP: 8.0000 mg | ORAL_TABLET | Freq: Once | ORAL | Status: DC

## 2016-03-10 MED ORDER — ONDANSETRON HCL 4 MG/2ML IJ SOLN
4.0000 mg | Freq: Once | INTRAMUSCULAR | Status: AC
  Administered 2016-03-10: 4 mg via INTRAVENOUS
  Filled 2016-03-10: qty 2

## 2016-03-10 NOTE — ED Provider Notes (Signed)
[facility] DEPT Provider Note   CSN: [id number] Arrival date & time: 03/10/16  2055  By signing my name below, I, [name], attest that this documentation has been prepared under the direction and in the presence of Veryl Speak, MD. Electronically Signed: [name], ED Scribe. 03/10/16. 10:27 PM.   History   Chief Complaint Chief Complaint  Patient presents with  . Flank Pain   The history is provided by the patient. No language interpreter was used.  Flank Pain   HPI Comments: [name] is a 50 y.o. male with PMHx of DM, CAD, GERD, HTN, HLD and MI who presents to the Emergency Department complaining of gradual onset, constant left flank pain onset. He states the pain has radiated to his left groin area and now radiates to his left testicle. Pt states his testicle does not feel swollen or warm to the touch. Pt took Vicodin this morning with no relief to pain. Pt has been seen in the ED on 03/03/2016 for a bladder infection and 03/10/2016 for the same symptoms as tonight. On 9/10 he was prescribed antibiotics which provided relief to symptoms. Earlier today pt was seen for same symptoms after washing his truck and "plugging" his neighbor's yard. Afterwards while talking to his neighbor, he could feel a dull pain beginning in his left side. Denies abdominal pain, fever.  Past Medical History:  Diagnosis Date  . 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 CAD (coronary artery disease), minimal, non obstructive 2013 01/01/2013  . Constipation   . 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 Diabetes mellitus   . GERD (gastroesophageal reflux disease)   . Hyperlipidemia   . Hypertension   . Myocardial infarction [facility])    2012    Patient Active Problem List   Diagnosis Date Noted  . 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 Esophageal dysphagia, with solids only, leading to chest pain 01/22/2013  . HTN (hypertension) 01/22/2013  . Male sexual  dysfunction 01/22/2013  . Chest pain 01/01/2013  . CAD (coronary artery disease), minimal, non obstructive 2013 01/01/2013  . GERD (gastroesophageal reflux disease) 01/01/2013  . Abnormal EKG, ?st elevation reported at OSH 08/18/2011  . Diabetes mellitus ([place]) 08/18/2011  . Obesity 08/18/2011    Past Surgical History:  Procedure Laterality Date  . CARDIAC CATHETERIZATION  2013   non obstructive CAD  ~20%  . CHOLECYSTECTOMY N/A 04/01/2014   Procedure: LAPAROSCOPIC CHOLECYSTECTOMY;  Surgeon: Jamesetta So, MD;  Location: [facility] ORS;  Service: General;  Laterality: N/A;  . ESOPHAGOGASTRODUODENOSCOPY N/A 01/10/2014   Dr. Gala Romney: antral erosions, path with chronic inactive gastritis. Empiric dilation with 56-F dilation  . ESOPHAGOGASTRODUODENOSCOPY (EGD) WITH PROPOFOL N/A 02/16/2016   Procedure: ESOPHAGOGASTRODUODENOSCOPY (EGD) WITH PROPOFOL;  Surgeon: Daneil Dolin, MD;  Location[facility];  Service: Endoscopy;  Laterality: N/A;  915  . KNEE ARTHROSCOPY Right 2002  . LEFT HEART CATH N/A 08/18/2011   Procedure: LEFT HEART CATH;  Surgeon: Lorretta Harp, MD;  Location: [facility] CATH LAB;  Service: Cardiovascular;  Laterality: N/A;  . LIVER BIOPSY N/A 04/01/2014   mild fibrosis  . MALONEY DILATION N/A 01/10/2014   Procedure: Venia Minks DILATION;  Surgeon: Daneil Dolin, MD;  Location: [facility];  Service: Endoscopy;  Laterality: N/A;  Venia Minks DILATION N/A 02/16/2016   Procedure: Venia Minks DILATION;  Surgeon: Daneil Dolin, MD;  Location: [facility];  Service: Endoscopy;  Laterality: N/A;  .  SAVORY DILATION N/A 01/10/2014   Procedure: SAVORY DILATION;  Surgeon: Daneil Dolin, MD;  Location: [facility];  Service: Endoscopy;  Laterality: N/A;       Home Medications    Prior to Admission medications   Medication Sig Start Date End Date Taking? Authorizing Provider  amLODipine (NORVASC) 10 MG tablet Take 1 tablet (10 mg total) by mouth daily. 07/18/15   Herminio Commons, MD  aspirin EC  81 MG tablet Take 1 tablet (81 mg total) by mouth daily. 11/23/15   Herminio Commons, MD  Azilsartan Medoxomil (EDARBI) 40 MG TABS Take 1 tablet by mouth daily. 11/23/15   Herminio Commons, MD  cephALEXin (KEFLEX) 500 MG capsule Take 1 capsule (500 mg total) by mouth 4 (four) times daily. 03/03/16   Milton Ferguson, MD  cyclobenzaprine (FLEXERIL) 5 MG tablet Take 1 tablet (5 mg total) by mouth 3 (three) times daily as needed. 03/10/16   Rolland Porter, MD  glimepiride (AMARYL) 4 MG tablet Take 1 tablet by mouth daily. 12/20/15   Historical Provider, MD  glyBURIDE micronized (GLYNASE) 6 MG tablet Take 6 mg by mouth daily with breakfast. Reported on 12/25/2015    Historical Provider, MD  HYDROcodone-acetaminophen (NORCO/VICODIN) 5-325 MG tablet Take 1 tablet by mouth every 6 (six) hours as needed. 03/03/16   Milton Ferguson, MD  ibuprofen (ADVIL,MOTRIN) 200 MG tablet Take 200 mg by mouth 2 (two) times daily as needed for moderate pain.    Historical Provider, MD  isosorbide mononitrate (IMDUR) 60 MG 24 hr tablet Take 1 tablet (60 mg total) by mouth daily. 07/18/15   Herminio Commons, MD  loperamide (IMODIUM A-D) 2 MG tablet Take 2 mg by mouth 4 (four) times daily as needed for diarrhea or loose stools.    Historical Provider, MD  Magnesium Gluconate 27.5 MG TABS Take 1 tablet by mouth daily as needed (for cramps). Reported on 12/25/2015    Historical Provider, MD  metFORMIN (GLUCOPHAGE) 500 MG tablet Take 500 mg by mouth daily with breakfast.     Historical Provider, MD  naproxen (NAPROSYN) 500 MG tablet Take 1 po BID with food prn pain 03/10/16   Rolland Porter, MD  nitroGLYCERIN (NITROSTAT) 0.4 MG SL tablet Place 1 tablet (0.4 mg total) under the tongue every 5 (five) minutes as needed for chest pain. 07/18/15   Herminio Commons, MD  ondansetron (ZOFRAN ODT) 4 MG disintegrating tablet 61m ODT q4 hours prn nausea/vomit 03/03/16   JMilton Ferguson MD  pantoprazole (PROTONIX) 40 MG tablet TAKE 1 TABLET BY MOUTH EVERY DAY  (CANCEL OMEPRAZOLE) 02/20/16   Historical Provider, MD  ranitidine (ZANTAC) 150 MG tablet Take 300 mg by mouth 5 (five) times daily as needed for heartburn. Taking approximately 5 times per day    Historical Provider, MD  simvastatin (ZOCOR) 40 MG tablet Take 40 mg by mouth every evening.    Historical Provider, MD  sitaGLIPtin (JANUVIA) 100 MG tablet Take 100 mg by mouth daily.    Historical Provider, MD    Family History Family History  Problem Relation Age of Onset  . Diabetes type II Sister   . Heart attack Cousin   . Colon cancer Neg Hx   . Stomach cancer Neg Hx     Social History Social History  Substance Use Topics  . Smoking status: Former Smoker    Quit date: 08/18/1995  . Smokeless tobacco: Never Used     Comment: Quit smoking x 20 years  .  Alcohol use No     Allergies   Codeine and Sulfa antibiotics   Review of Systems Review of Systems  Genitourinary: Positive for flank pain.  A complete 10 system review of systems was obtained and all systems are negative except as noted in the HPI and PMH.    Physical Exam Updated Vital Signs BP 130/74 (BP Location: Left Arm)   Pulse 74   Temp 97.9 F (36.6 C) (Oral)   Resp 20   Ht 6' 4"  (1.93 m)   Wt (!) 320 lb (145.2 kg)   SpO2 98%   BMI 38.95 kg/m   Physical Exam  Constitutional: He is oriented to person, place, and time. He appears well-developed and well-nourished. No distress.  HENT:  Head: Normocephalic and atraumatic.  Mouth/Throat: Oropharynx is clear and moist.  Neck: Normal range of motion. Neck supple.  Cardiovascular: Normal rate and regular rhythm.  Exam reveals no friction rub.   No murmur heard. Pulmonary/Chest: Effort normal and breath sounds normal. No respiratory distress. He has no wheezes. He has no rales.  Abdominal: Soft. Bowel sounds are normal. He exhibits no distension. There is no tenderness.  Physical examination reveals mild left CVA tenderness. Abdomen is otherwise benign.    Genitourinary: Penis normal.  Genitourinary Comments: The testicles appear grossly normal. There is no scrotal swelling or hematoma. The testicles are nontender and freely mobile.  Musculoskeletal: Normal range of motion. He exhibits no edema.  Neurological: He is alert and oriented to person, place, and time. Coordination normal.  Skin: Skin is warm and dry. He is not diaphoretic.  Nursing note and vitals reviewed.    ED Treatments / Results  DIAGNOSTIC STUDIES: Oxygen Saturation is 98% on RA, normal by my interpretation.    COORDINATION OF CARE: 10:16 PM Discussed treatment plan with pt at bedside which includes Hydromorphone and CT Renal and pt agreed to plan.  Labs (all labs ordered are listed, but only abnormal results are displayed) Labs Reviewed - No data to display  EKG   Radiology No results found.  Procedures Procedures (including critical care time)  Medications Ordered in ED Medications - No data to display   Initial Impression / Assessment and Plan / ED Course  I have reviewed the triage vital signs and the nursing notes.  Pertinent labs & imaging results that were available during my care of the patient were reviewed by me and considered in my medical decision making (see chart for details).  Clinical Course  Patient presents here with complaints of severe pain in his left testicle and left flank. This is been ongoing for nearly one week. This is his third trip to the ER for a similar complaint. 5 days ago, he underwent a CT to rule out renal calculus. This was negative. He returned early this a.m. with a recurrence of his pain. This was treated and he was discharged. His pain returned again this evening and presents for a third time. His physical examination is unremarkable. I see no evidence for testicular torsion or other abnormality. His urinalysis is clear and laboratory studies are unremarkable. Due to the degree of his discomfort, the CT scan was repeated  and was unremarkable. He was given medicine for pain and nausea and is now resting comfortably. I will make arrangements for an ultrasound to be performed in the morning and prescribed a stronger pain pill for him. He is to follow-up with his primary Dr. in the next week.   I personally performed  the services described in this documentation, which was scribed in my presence. The recorded information has been reviewed and is accurate.    Final Clinical Impressions(s) / ED Diagnoses   Final diagnoses:  None    New Prescriptions New Prescriptions   No medications on file     Veryl Speak, MD 03/11/16 0009

## 2016-03-10 NOTE — Discharge Instructions (Signed)
Use ice and heat on the painful area. Take the medications as prescribed. Recheck if you get a fever, vomiting or you seem worse.

## 2016-03-10 NOTE — ED Triage Notes (Signed)
Patient states that he had a bad UTI and just finished antibiotics.  States that he was feeling better and went to help a neighbor with yard work, started hurting on left side.  States that he was hurting so bad last night that he took two vicodin without relief.  States that he was checked for kidney stones last week and none were noticed.

## 2016-03-10 NOTE — ED Provider Notes (Signed)
[facility] DEPT Provider Note   CSN: [id number] Arrival date & time: 03/10/16  [id number]  Time seen 06:11 AM   History   Chief Complaint Chief Complaint  Patient presents with  . Flank Pain    HPI Jerome Shepherd is a 50 y.o. male.  HPI patient states yesterday he did yard work for 3-4 hours and was plugging in seating is yard, he also washed his big semi-truck. He states about 3 PM after doing this activity he started getting a dull constant pain in his left flank area. He states nothing makes her more, nothing he does makes it feel better. He's had mild nausea without vomiting. He denies dysuria, frequency, or fever and chills although he did have them on the 10th when he was seen in the ED and was diagnosed with a urinary tract infection. He states those symptoms have improved and he has almost finished his antibiotics.  PCP  Dr Woody Seller  Past Medical History:  Diagnosis Date  . 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 CAD (coronary artery disease), minimal, non obstructive 2013 01/01/2013  . Constipation   . 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 Diabetes mellitus   . GERD (gastroesophageal reflux disease)   . Hyperlipidemia   . Hypertension   . Myocardial infarction [facility])    2012    Patient Active Problem List   Diagnosis Date Noted  . 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 Esophageal dysphagia, with solids only, leading to chest pain 01/22/2013  . HTN (hypertension) 01/22/2013  . 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 Chest pain 01/01/2013  . CAD (coronary artery disease), minimal, non obstructive 2013 01/01/2013  . GERD (gastroesophageal reflux disease) 01/01/2013  . Abnormal EKG, ?st elevation reported at OSH 08/18/2011  . Diabetes mellitus ([place]) 08/18/2011  . Obesity 08/18/2011    Past Surgical History:  Procedure Laterality Date  . CARDIAC CATHETERIZATION  2013   non obstructive CAD  ~20%  . CHOLECYSTECTOMY N/A 04/01/2014   Procedure: LAPAROSCOPIC CHOLECYSTECTOMY;  Surgeon: Jamesetta So, MD;  Location: [facility] ORS;  Service: General;  Laterality: N/A;  . ESOPHAGOGASTRODUODENOSCOPY N/A 01/10/2014   Dr. Gala Romney: antral erosions, path with chronic inactive gastritis. Empiric dilation with 56-F dilation  . ESOPHAGOGASTRODUODENOSCOPY (EGD) WITH PROPOFOL N/A 02/16/2016   Procedure: ESOPHAGOGASTRODUODENOSCOPY (EGD) WITH PROPOFOL;  Surgeon: Daneil Dolin, MD;  Location: [facility] ENDO SUITE;  Service: Endoscopy;  Laterality: N/A;  915  . KNEE ARTHROSCOPY Right 2002  . LEFT HEART CATH N/A 08/18/2011   Procedure: LEFT HEART CATH;  Surgeon: Lorretta Harp, MD;  Location: [facility] CATH LAB;  Service: Cardiovascular;  Laterality: N/A;  . LIVER BIOPSY N/A 04/01/2014   mild fibrosis  . MALONEY DILATION N/A 01/10/2014   Procedure: Venia Minks DILATION;  Surgeon: Daneil Dolin, MD;  Location: [facility] ENDO SUITE;  Service: Endoscopy;  Laterality: N/A;  Venia Minks DILATION N/A 02/16/2016   Procedure: Venia Minks DILATION;  Surgeon: Daneil Dolin, MD;  Location: [facility] ENDO SUITE;  Service: Endoscopy;  Laterality: N/A;  . SAVORY DILATION N/A 01/10/2014   Procedure: SAVORY DILATION;  Surgeon: Daneil Dolin, MD;  Location: [facility] ENDO SUITE;  Service: Endoscopy;  Laterality: N/A;       Home Medications    Prior to Admission medications   Medication Sig Start Date End Date Taking? Authorizing Provider  amLODipine (NORVASC) 10 MG tablet Take 1 tablet (10 mg total) by mouth daily. 07/18/15  Herminio Commons, MD  aspirin EC 81 MG tablet Take 1 tablet (81 mg total) by mouth daily. 11/23/15   Herminio Commons, MD  Azilsartan Medoxomil (EDARBI) 40 MG TABS Take 1 tablet by mouth daily. 11/23/15   Herminio Commons, MD  cephALEXin (KEFLEX) 500 MG capsule Take 1 capsule (500 mg total) by mouth 4 (four) times daily. 03/03/16   Milton Ferguson, MD  cyclobenzaprine (FLEXERIL) 5 MG tablet Take 1 tablet (5 mg total) by mouth 3 (three) times daily as needed. 03/10/16   Rolland Porter, MD    glimepiride (AMARYL) 4 MG tablet Take 1 tablet by mouth daily. 12/20/15   Historical Provider, MD  glyBURIDE micronized (GLYNASE) 6 MG tablet Take 6 mg by mouth daily with breakfast. Reported on 12/25/2015    Historical Provider, MD  HYDROcodone-acetaminophen (NORCO/VICODIN) 5-325 MG tablet Take 1 tablet by mouth every 6 (six) hours as needed. 03/03/16   Milton Ferguson, MD  ibuprofen (ADVIL,MOTRIN) 200 MG tablet Take 200 mg by mouth 2 (two) times daily as needed for moderate pain.    Historical Provider, MD  isosorbide mononitrate (IMDUR) 60 MG 24 hr tablet Take 1 tablet (60 mg total) by mouth daily. 07/18/15   Herminio Commons, MD  loperamide (IMODIUM A-D) 2 MG tablet Take 2 mg by mouth 4 (four) times daily as needed for diarrhea or loose stools.    Historical Provider, MD  Magnesium Gluconate 27.5 MG TABS Take 1 tablet by mouth daily as needed (for cramps). Reported on 12/25/2015    Historical Provider, MD  metFORMIN (GLUCOPHAGE) 500 MG tablet Take 500 mg by mouth daily with breakfast.     Historical Provider, MD  naproxen (NAPROSYN) 500 MG tablet Take 1 po BID with food prn pain 03/10/16   Rolland Porter, MD  nitroGLYCERIN (NITROSTAT) 0.4 MG SL tablet Place 1 tablet (0.4 mg total) under the tongue every 5 (five) minutes as needed for chest pain. 07/18/15   Herminio Commons, MD  ondansetron (ZOFRAN ODT) 4 MG disintegrating tablet 4mg  ODT q4 hours prn nausea/vomit 03/03/16   Milton Ferguson, MD  pantoprazole (PROTONIX) 40 MG tablet TAKE 1 TABLET BY MOUTH EVERY DAY (CANCEL OMEPRAZOLE) 02/20/16   Historical Provider, MD  ranitidine (ZANTAC) 150 MG tablet Take 300 mg by mouth 5 (five) times daily as needed for heartburn. Taking approximately 5 times per day    Historical Provider, MD  simvastatin (ZOCOR) 40 MG tablet Take 40 mg by mouth every evening.    Historical Provider, MD  sitaGLIPtin (JANUVIA) 100 MG tablet Take 100 mg by mouth daily.    Historical Provider, MD    Family History Family History  Problem  Relation Age of Onset  . Diabetes type II Sister   . Heart attack Cousin   . Colon cancer Neg Hx   . Stomach cancer Neg Hx     Social History Social History  Substance Use Topics  . Smoking status: Former Smoker    Quit date: 08/18/1995  . Smokeless tobacco: Never Used     Comment: Quit smoking x 20 years  . Alcohol use No  Employed   Allergies   Codeine and Sulfa antibiotics   Review of Systems Review of Systems  All other systems reviewed and are negative.    Physical Exam Updated Vital Signs BP 144/78 (BP Location: Right Arm)   Pulse 73   Temp 97.5 F (36.4 C) (Oral)   Resp 18   Ht 6\' 4"  (1.93 m)  Wt (!) 320 lb (145.2 kg)   SpO2 97%   BMI 38.95 kg/m   Vital signs normal    Physical Exam  Constitutional: He is oriented to person, place, and time. He appears well-developed and well-nourished.  Non-toxic appearance. He does not appear ill.  Appears uncomfortable   HENT:  Head: Normocephalic and atraumatic.  Right Ear: External ear normal.  Left Ear: External ear normal.  Nose: Nose normal. No mucosal edema or rhinorrhea.  Mouth/Throat: Oropharynx is clear and moist and mucous membranes are normal. No dental abscesses or uvula swelling.  Eyes: Conjunctivae and EOM are normal. Pupils are equal, round, and reactive to light.  Neck: Normal range of motion and full passive range of motion without pain. Neck supple.  Cardiovascular: Normal rate, regular rhythm and normal heart sounds.  Exam reveals no gallop and no friction rub.   No murmur heard. Pulmonary/Chest: Effort normal and breath sounds normal. No respiratory distress. He has no wheezes. He has no rhonchi. He has no rales. He exhibits no tenderness and no crepitus.  Abdominal: Soft. Normal appearance and bowel sounds are normal. He exhibits no distension. There is no tenderness. There is no rebound and no guarding.  Musculoskeletal: Normal range of motion. He exhibits no edema or tenderness.        Back:  Moves all extremities well. Patient is tender just at the edge of the paraspinous muscle mass in his left lower lumbar spine area. He states it does not hurt when he changes positions.  Neurological: He is alert and oriented to person, place, and time. He has normal strength. No cranial nerve deficit.  Skin: Skin is warm, dry and intact. No rash noted. No erythema. No pallor.  Psychiatric: He has a normal mood and affect. His speech is normal and behavior is normal. His mood appears not anxious.  Nursing note and vitals reviewed.    ED Treatments / Results  Labs (all labs ordered are listed, but only abnormal results are displayed) Results for orders placed or performed during the hospital encounter of 03/10/16  Urinalysis, Routine w reflex microscopic  Result Value Ref Range   Color, Urine YELLOW YELLOW   APPearance CLEAR CLEAR   Specific Gravity, Urine 1.025 1.005 - 1.030   pH 6.0 5.0 - 8.0   Glucose, UA 500 (A) NEGATIVE mg/dL   Hgb urine dipstick NEGATIVE NEGATIVE   Bilirubin Urine NEGATIVE NEGATIVE   Ketones, ur TRACE (A) NEGATIVE mg/dL   Protein, ur NEGATIVE NEGATIVE mg/dL   Nitrite NEGATIVE NEGATIVE   Leukocytes, UA NEGATIVE NEGATIVE   Laboratory interpretation all normal     EKG   Radiology No results found.  Study Result  03/03/2016 CLINICAL DATA:  Initial evaluation for lower back pain radiating to glands normal urinary frequency.  EXAM: CT ABDOMEN AND PELVIS WITHOUT CONTRAST  TECHNIQUE: Multidetector CT imaging of the abdomen and pelvis was performed following the standard protocol without IV contrast.  COMPARISON:  None available.  FINDINGS: Lower chest: Visualized lung bases are clear. Coronary artery calcifications noted.  Hepatobiliary: Diffuse hypoattenuation liver consistent with steatosis. Liver otherwise unremarkable. Gallbladder surgically absent. No biliary dilatation.  Pancreas: Pancreas within normal limits.  Spleen:  Spleen unremarkable.  Adrenals/Urinary Tract: Adrenal glands within normal limits. Kidneys equal in size without evidence nephrolithiasis or hydronephrosis. No radiopaque calculi seen along the course of either renal collecting system. There is no hydroureter. Bladder partially distended without acute abnormality. No layering stones within the bladder lumen.  Stomach/Bowel: Stomach  within normal limits. No evidence for bowel obstruction. Appendix within normal limits. No abnormal wall thickening or inflammatory fat stranding seen about the bowels.  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 No aneurysm. No adenopathy.  Reproductive: Prostate within normal limits.  Other: No free air or fluid.  Musculoskeletal: External soft tissues demonstrate no acute abnormality. No acute osseous abnormality. No worrisome lytic or blastic osseous lesions.  IMPRESSION: 1. No CT evidence for nephrolithiasis or obstructive uropathy. 2. No other acute intra-abdominal or pelvic process. 3. Status post cholecystectomy. 4. Hepatic steatosis. 5. 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   Electronically Signed   By: Jeannine Boga M.D.   On: 03/03/2016 21:09   February 16, 2016 Endoscopy Findings:      The examined esophagus was normal. The scope was withdrawn. Dilation was       performed with a Maloney dilator with no resistance at 56 Fr. The scope       was withdrawn. Dilation was performed with a Maloney dilator with no       resistance at 50 Fr. The dilation site was examined following endoscope       reinsertion and showed no change. Estimated blood loss: none.      A small hiatal hernia was present.      The exam was otherwise without abnormality.      The duodenal bulb and second portion of the duodenum were normal. Impression:               - Normal esophagus. Dilated.                           - Small  hiatal hernia.                           - The examination was otherwise normal.                           - Normal duodenal bulb and second portion of the                            duodenum.                           - No specimens collected.  Procedures Procedures (including critical care time)  Medications Ordered in ED Medications  ketorolac (TORADOL) injection 60 mg (60 mg Intramuscular Given 03/10/16 0634)  diazepam (VALIUM) injection 5 mg (5 mg Intramuscular Given 03/10/16 [id number])     Initial Impression / Assessment and Plan / ED Course  I have reviewed the triage vital signs and the nursing notes.  Pertinent labs & imaging results that were available during my care of the patient were reviewed by me and considered in my medical decision making (see chart for details).  Clinical Course   Patient drove himself to the ED but states he can have someone pick him up. He is given Toradol and Valium IM. Pt pain is felt to be musculoskeletal in nature.    Final Clinical Impressions(s) / ED Diagnoses   Final diagnoses:  Left flank pain    New Prescriptions New Prescriptions   CYCLOBENZAPRINE (FLEXERIL) 5 MG TABLET    Take 1 tablet (5 mg total) by mouth 3 (three) times daily as  needed.   NAPROXEN (NAPROSYN) 500 MG TABLET    Take 1 po BID with food prn pain   Plan discharge  Rolland Porter, MD, Barbette Or, MD 03/10/16 [phone]

## 2016-03-10 NOTE — ED Triage Notes (Signed)
Pt c/o left flank pain and left testicle pain. Pt was seen here earlier for the same.

## 2016-03-11 ENCOUNTER — Inpatient Hospital Stay (HOSPITAL_COMMUNITY)
Admission: EM | Admit: 2016-03-11 | Discharge: 2016-03-19 | DRG: 596 | Disposition: A | Discharge status: Home / Self Care | Payer: No Typology Code available for payment source | Attending: Internal Medicine | Admitting: Internal Medicine

## 2016-03-11 ENCOUNTER — Ambulatory Visit (HOSPITAL_COMMUNITY)
Admit: 2016-03-11 | Discharge: 2016-03-11 | Disposition: A | Discharge status: Home / Self Care | Payer: No Typology Code available for payment source | Attending: Emergency Medicine | Admitting: Emergency Medicine

## 2016-03-11 ENCOUNTER — Other Ambulatory Visit (HOSPITAL_COMMUNITY): Payer: Self-pay | Admitting: Emergency Medicine

## 2016-03-11 ENCOUNTER — Encounter (HOSPITAL_COMMUNITY): Payer: Self-pay | Admitting: Vascular Surgery

## 2016-03-11 DIAGNOSIS — IMO0002 Reserved for concepts with insufficient information to code with codable children: Secondary | ICD-10-CM

## 2016-03-11 DIAGNOSIS — N12 Tubulo-interstitial nephritis, not specified as acute or chronic: Secondary | ICD-10-CM | POA: Diagnosis present

## 2016-03-11 DIAGNOSIS — M79605 Pain in left leg: Secondary | ICD-10-CM

## 2016-03-11 DIAGNOSIS — E119 Type 2 diabetes mellitus without complications: Secondary | ICD-10-CM

## 2016-03-11 DIAGNOSIS — A499 Bacterial infection, unspecified: Secondary | ICD-10-CM

## 2016-03-11 DIAGNOSIS — B029 Zoster without complications: Secondary | ICD-10-CM

## 2016-03-11 DIAGNOSIS — I252 Old myocardial infarction: Secondary | ICD-10-CM

## 2016-03-11 DIAGNOSIS — N10 Acute tubulo-interstitial nephritis: Secondary | ICD-10-CM

## 2016-03-11 DIAGNOSIS — N50812 Left testicular pain: Secondary | ICD-10-CM

## 2016-03-11 DIAGNOSIS — Z1612 Extended spectrum beta lactamase (ESBL) resistance: Secondary | ICD-10-CM | POA: Diagnosis present

## 2016-03-11 DIAGNOSIS — M545 Low back pain: Secondary | ICD-10-CM

## 2016-03-11 DIAGNOSIS — I2510 Atherosclerotic heart disease of native coronary artery without angina pectoris: Secondary | ICD-10-CM | POA: Diagnosis present

## 2016-03-11 DIAGNOSIS — E1165 Type 2 diabetes mellitus with hyperglycemia: Secondary | ICD-10-CM | POA: Diagnosis present

## 2016-03-11 DIAGNOSIS — M5137 Other intervertebral disc degeneration, lumbosacral region: Secondary | ICD-10-CM | POA: Diagnosis present

## 2016-03-11 DIAGNOSIS — E118 Type 2 diabetes mellitus with unspecified complications: Secondary | ICD-10-CM

## 2016-03-11 DIAGNOSIS — I10 Essential (primary) hypertension: Secondary | ICD-10-CM | POA: Diagnosis present

## 2016-03-11 DIAGNOSIS — K219 Gastro-esophageal reflux disease without esophagitis: Secondary | ICD-10-CM | POA: Diagnosis present

## 2016-03-11 DIAGNOSIS — B9620 Unspecified Escherichia coli [E. coli] as the cause of diseases classified elsewhere: Secondary | ICD-10-CM | POA: Diagnosis present

## 2016-03-11 DIAGNOSIS — E785 Hyperlipidemia, unspecified: Secondary | ICD-10-CM | POA: Diagnosis present

## 2016-03-11 DIAGNOSIS — G894 Chronic pain syndrome: Secondary | ICD-10-CM

## 2016-03-11 DIAGNOSIS — Z7984 Long term (current) use of oral hypoglycemic drugs: Secondary | ICD-10-CM

## 2016-03-11 DIAGNOSIS — N179 Acute kidney failure, unspecified: Secondary | ICD-10-CM | POA: Diagnosis present

## 2016-03-11 DIAGNOSIS — Z79899 Other long term (current) drug therapy: Secondary | ICD-10-CM

## 2016-03-11 LAB — URINALYSIS, ROUTINE W REFLEX MICROSCOPIC
GLUCOSE, UA: 100 mg/dL — AB
HGB URINE DIPSTICK: NEGATIVE
Ketones, ur: 15 mg/dL — AB
Nitrite: NEGATIVE
PH: 5.5 (ref 5.0–8.0)
Protein, ur: NEGATIVE mg/dL
Specific Gravity, Urine: 1.028 (ref 1.005–1.030)

## 2016-03-11 LAB — COMPREHENSIVE METABOLIC PANEL
ALT: 26 U/L (ref 17–63)
AST: 45 U/L — AB (ref 15–41)
Albumin: 4 g/dL (ref 3.5–5.0)
Alkaline Phosphatase: 61 U/L (ref 38–126)
Anion gap: 9 (ref 5–15)
BUN: 16 mg/dL (ref 6–20)
CHLORIDE: 103 mmol/L (ref 101–111)
CO2: 24 mmol/L (ref 22–32)
Calcium: 9.1 mg/dL (ref 8.9–10.3)
Creatinine, Ser: 1.35 mg/dL — ABNORMAL HIGH (ref 0.61–1.24)
GFR, EST NON AFRICAN AMERICAN: 59 mL/min — AB (ref >60)
Glucose, Bld: 201 mg/dL — ABNORMAL HIGH (ref 65–99)
POTASSIUM: 3.9 mmol/L (ref 3.5–5.1)
SODIUM: 136 mmol/L (ref 135–145)
Total Bilirubin: 0.6 mg/dL (ref 0.3–1.2)
Total Protein: 6.6 g/dL (ref 6.5–8.1)

## 2016-03-11 LAB — CBC
HEMATOCRIT: 44 % (ref 39.0–52.0)
Hemoglobin: 15.3 g/dL (ref 13.0–17.0)
MCH: 30.9 pg (ref 26.0–34.0)
MCHC: 34.8 g/dL (ref 30.0–36.0)
MCV: 88.9 fL (ref 78.0–100.0)
Platelets: 284 10*3/uL (ref 150–400)
RBC: 4.95 MIL/uL (ref 4.22–5.81)
RDW: 12.2 % (ref 11.5–15.5)
WBC: 10.8 10*3/uL — AB (ref 4.0–10.5)

## 2016-03-11 LAB — URINE MICROSCOPIC-ADD ON
Bacteria, UA: NONE SEEN
RBC / HPF: NONE SEEN RBC/hpf (ref 0–5)
Squamous Epithelial / LPF: NONE SEEN

## 2016-03-11 LAB — LIPASE, BLOOD: LIPASE: 38 U/L (ref 11–51)

## 2016-03-11 MED ORDER — HYDROMORPHONE HCL 1 MG/ML IJ SOLN
1.0000 mg | Freq: Once | INTRAMUSCULAR | Status: AC
Start: 2016-03-11 — End: 2016-03-11
  Administered 2016-03-11: 1 mg via INTRAVENOUS
  Filled 2016-03-11: qty 1

## 2016-03-11 MED ORDER — OXYCODONE-ACETAMINOPHEN 5-325 MG PO TABS
1.0000 | ORAL_TABLET | ORAL | Status: DC | PRN
  Administered 2016-03-11: 1 via ORAL

## 2016-03-11 MED ORDER — ONDANSETRON 4 MG PO TBDP
ORAL_TABLET | ORAL | Status: AC
  Filled 2016-03-11: qty 1

## 2016-03-11 MED ORDER — ONDANSETRON HCL 4 MG/2ML IJ SOLN
4.0000 mg | Freq: Once | INTRAMUSCULAR | Status: AC
  Administered 2016-03-11: 4 mg via INTRAVENOUS
  Filled 2016-03-11: qty 2

## 2016-03-11 MED ORDER — OXYCODONE-ACETAMINOPHEN 5-325 MG PO TABS
ORAL_TABLET | ORAL | Status: AC
  Administered 2016-03-12: 2 via ORAL
  Filled 2016-03-11: qty 1

## 2016-03-11 MED ORDER — OXYCODONE-ACETAMINOPHEN 5-325 MG PO TABS: 1.0000 | ORAL_TABLET | Freq: Four times a day (QID) | ORAL | 0 refills | Status: DC | PRN

## 2016-03-11 MED ORDER — SODIUM CHLORIDE 0.9 % IV BOLUS (SEPSIS)
1000.0000 mL | Freq: Once | INTRAVENOUS | Status: AC
  Administered 2016-03-11: 1000 mL via INTRAVENOUS

## 2016-03-11 MED ORDER — IMIPENEM-CILASTATIN 500 MG IV SOLR
1000.0000 mg | Freq: Three times a day (TID) | INTRAVENOUS | Status: AC
Start: 2016-03-11 — End: 2016-03-16
  Administered 2016-03-11 – 2016-03-16 (×16): 1000 mg via INTRAVENOUS
  Filled 2016-03-11 (×20): qty 1000

## 2016-03-11 MED ORDER — ONDANSETRON 4 MG PO TBDP
4.0000 mg | ORAL_TABLET | Freq: Once | ORAL | Status: AC
  Administered 2016-03-11: 4 mg via ORAL

## 2016-03-11 MED ORDER — ONDANSETRON 4 MG PO TBDP: 8.0000 mg | ORAL_TABLET | Freq: Once | ORAL | Status: DC

## 2016-03-11 NOTE — Discharge Instructions (Signed)
Percocet as prescribed as needed for pain.  Return tomorrow at the given time for an ultrasound of your testicle to rule out additional pathology.  Follow-up with your primary Dr. in the next few days for a recheck if you are not improving.

## 2016-03-11 NOTE — Progress Notes (Signed)
Pharmacy Antibiotic Note  Jerome Shepherd is a 50 y.o. male admitted on 03/11/2016 with flank pain/ UTI.  Pharmacy has been consulted for imipenem/cilastatin dosing. Patient has recent urine culture (9/10) with ESBL E. Coli and sensitivity to imipenem/cilastatin.   Plan: Imipenem/cilastatin 1000 mg IV q8h Monitor renal function, culture results, and clinical picture  Follow-up length of therapy   Temp (24hrs), Avg:98.6 F (37 C), Min:97.7 F (36.5 C), Max:99.5 F (37.5 C)   Recent Labs Lab 03/10/16 2256 03/11/16 1650  WBC 7.8 10.8*  CREATININE 1.05 1.35*    Estimated Creatinine Clearance: 100.9 mL/min (by C-G formula based on SCr of 1.35 mg/dL (H)).    Allergies  Allergen Reactions  . Codeine Nausea Only  . Sulfa Antibiotics Nausea Only    Antimicrobials this admission: 9/18 imipenem/cilastatin >>   Dose adjustments this admission:N/A  Microbiology results: pending  9/10 UCx:  ESBL e.coli    Thank you for allowing pharmacy to be a part of this patient's care.  Argie Ramming, PharmD Pharmacy Resident  Pager [phone] 03/11/16 10:38 PM

## 2016-03-11 NOTE — ED Provider Notes (Signed)
[facility] DEPT Provider Note   CSN: [id number] Arrival date & time: 03/11/16  1606     History   Chief Complaint Chief Complaint  Patient presents with  . Groin Pain    HPI Jerome Shepherd is a 50 y.o. male.  Patient presents for 1 week of worsening flank pain. Patient reports present into [facility] one week ago for difficulty voiding. He states that he had urgency to void, but was only able to void a few drops of urine. He was diagnosed with a urinary tract infection at this time and discharged with Keflex which she has been taking compliantly. He states that he started to feel better for the first 48 hours after this evaluation, but noticed worsening left-sided flank pain 2 days ago. He went to [facility] yesterday and had a negative CT scan as well as a reassuring ultrasound of his scrotum. It was deemed that his pain was secondary to musculoskeletal etiology. He was discharged with Percocet which she has been taking with little relief. Patient states that pain continues to be constant and severe. He feels it radiating from his left flank to his left hemiscrotum. He denies hematuria or dysuria. He does report subjective fevers as well as nausea. No emesis.       Past Medical History:  Diagnosis Date  . 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 CAD (coronary artery disease), minimal, non obstructive 2013 01/01/2013  . Constipation   . 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 Diabetes mellitus   . GERD (gastroesophageal reflux disease)   . Hyperlipidemia   . Hypertension   . Myocardial infarction [facility])    2012    Patient Active Problem List   Diagnosis Date Noted  . 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 Esophageal dysphagia, with solids only, leading to chest pain 01/22/2013  . HTN (hypertension) 01/22/2013  . 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 Chest pain 01/01/2013  . CAD (coronary artery disease), minimal, non obstructive 2013  01/01/2013  . GERD (gastroesophageal reflux disease) 01/01/2013  . Abnormal EKG, ?st elevation reported at OSH 08/18/2011  . Diabetes mellitus ([place]) 08/18/2011  . Obesity 08/18/2011    Past Surgical History:  Procedure Laterality Date  . CARDIAC CATHETERIZATION  2013   non obstructive CAD  ~20%  . CHOLECYSTECTOMY N/A 04/01/2014   Procedure: LAPAROSCOPIC CHOLECYSTECTOMY;  Surgeon: Jamesetta So, MD;  Location: [facility] ORS;  Service: General;  Laterality: N/A;  . ESOPHAGOGASTRODUODENOSCOPY N/A 01/10/2014   Dr. Gala Romney: antral erosions, path with chronic inactive gastritis. Empiric dilation with 56-F dilation  . ESOPHAGOGASTRODUODENOSCOPY (EGD) WITH PROPOFOL N/A 02/16/2016   Procedure: ESOPHAGOGASTRODUODENOSCOPY (EGD) WITH PROPOFOL;  Surgeon: Daneil Dolin, MD;  Location: [facility] ENDO SUITE;  Service: Endoscopy;  Laterality: N/A;  915  . KNEE ARTHROSCOPY Right 2002  . LEFT HEART CATH N/A 08/18/2011   Procedure: LEFT HEART CATH;  Surgeon: Lorretta Harp, MD;  Location: [facility] CATH LAB;  Service: Cardiovascular;  Laterality: N/A;  . LIVER BIOPSY N/A 04/01/2014   mild fibrosis  . MALONEY DILATION N/A 01/10/2014   Procedure: Venia Minks DILATION;  Surgeon: Daneil Dolin, MD;  Location: [facility] ENDO SUITE;  Service: Endoscopy;  Laterality: N/A;  Venia Minks DILATION N/A 02/16/2016   Procedure: Venia Minks DILATION;  Surgeon: Daneil Dolin, MD;  Location: [facility] ENDO SUITE;  Service: Endoscopy;  Laterality: N/A;  . SAVORY DILATION N/A 01/10/2014   Procedure: SAVORY DILATION;  Surgeon: Daneil Dolin, MD;  Location: [facility] ENDO SUITE;  Service: Endoscopy;  Laterality: N/A;       Home Medications    Prior to Admission medications   Medication Sig Start Date End Date Taking? Authorizing Provider  amLODipine (NORVASC) 10 MG tablet Take 1 tablet (10 mg total) by mouth daily. 07/18/15   Herminio Commons, MD  aspirin EC 81 MG tablet Take 1 tablet (81 mg total) by mouth daily. 11/23/15   Herminio Commons, MD  Azilsartan Medoxomil  (EDARBI) 40 MG TABS Take 1 tablet by mouth daily. 11/23/15   Herminio Commons, MD  cephALEXin (KEFLEX) 500 MG capsule Take 1 capsule (500 mg total) by mouth 4 (four) times daily. 03/03/16   Milton Ferguson, MD  cyclobenzaprine (FLEXERIL) 5 MG tablet Take 1 tablet (5 mg total) by mouth 3 (three) times daily as needed. 03/10/16   Rolland Porter, MD  glimepiride (AMARYL) 4 MG tablet Take 1 tablet by mouth daily. 12/20/15   Historical Provider, MD  glyBURIDE micronized (GLYNASE) 6 MG tablet Take 6 mg by mouth daily with breakfast. Reported on 12/25/2015    Historical Provider, MD  HYDROcodone-acetaminophen (NORCO/VICODIN) 5-325 MG tablet Take 1 tablet by mouth every 6 (six) hours as needed. Patient taking differently: Take 1 tablet by mouth every 6 (six) hours as needed for moderate pain.  03/03/16   Milton Ferguson, MD  ibuprofen (ADVIL,MOTRIN) 200 MG tablet Take 200 mg by mouth 2 (two) times daily as needed for moderate pain.    Historical Provider, MD  isosorbide mononitrate (IMDUR) 60 MG 24 hr tablet Take 1 tablet (60 mg total) by mouth daily. 07/18/15   Herminio Commons, MD  loperamide (IMODIUM A-D) 2 MG tablet Take 2 mg by mouth 4 (four) times daily as needed for diarrhea or loose stools.    Historical Provider, MD  Magnesium Gluconate 27.5 MG TABS Take 1 tablet by mouth daily as needed (for cramps). Reported on 12/25/2015    Historical Provider, MD  metFORMIN (GLUCOPHAGE) 500 MG tablet Take 500 mg by mouth daily with breakfast.     Historical Provider, MD  naproxen (NAPROSYN) 500 MG tablet Take 1 po BID with food prn pain Patient taking differently: Take 500 mg by mouth.  03/10/16   Rolland Porter, MD  nitroGLYCERIN (NITROSTAT) 0.4 MG SL tablet Place 1 tablet (0.4 mg total) under the tongue every 5 (five) minutes as needed for chest pain. 07/18/15   Herminio Commons, MD  ondansetron (ZOFRAN ODT) 4 MG disintegrating tablet 4mg  ODT q4 hours prn nausea/vomit Patient taking differently: Take 4 mg by mouth every 4  (four) hours as needed for nausea or vomiting. 4mg  ODT q4 hours prn nausea/vomit 03/03/16   Milton Ferguson, MD  oxyCODONE-acetaminophen (PERCOCET) 5-325 MG tablet Take 1-2 tablets by mouth every 6 (six) hours as needed. 03/11/16   Veryl Speak, MD  pantoprazole (PROTONIX) 40 MG tablet TAKE 1 TABLET BY MOUTH EVERY DAY (CANCEL OMEPRAZOLE) 02/20/16   Historical Provider, MD  ranitidine (ZANTAC) 150 MG tablet Take 300 mg by mouth 5 (five) times daily as needed for heartburn. Taking approximately 5 times per day    Historical Provider, MD  simvastatin (ZOCOR) 40 MG tablet Take 40 mg by mouth every evening.    Historical Provider, MD  sitaGLIPtin (JANUVIA) 100 MG tablet Take 100 mg by mouth daily.    Historical Provider, MD    Family History Family History  Problem Relation Age of Onset  . Diabetes  type II Sister   . Heart attack Cousin   . Colon cancer Neg Hx   . Stomach cancer Neg Hx     Social History Social History  Substance Use Topics  . Smoking status: Former Smoker    Quit date: 08/18/1995  . Smokeless tobacco: Never Used     Comment: Quit smoking x 20 years  . Alcohol use No     Allergies   Codeine and Sulfa antibiotics   Review of Systems Review of Systems  Constitutional: Positive for fever (subjective).  Gastrointestinal: Positive for nausea.  Genitourinary: Positive for flank pain. Negative for dysuria.  Ten systems reviewed and are negative for acute change, except as noted in the HPI.   Physical Exam Updated Vital Signs BP 123/67   Pulse 89   Temp 99.5 F (37.5 C) (Oral)   Resp 18   SpO2 97%   Physical Exam  Constitutional: He is oriented to person, place, and time. He appears well-developed and well-nourished. No distress.  Patient uncomfortable, writhing around in exam room bed  HENT:  Head: Normocephalic and atraumatic.  Eyes: Conjunctivae and EOM are normal. No scleral icterus.  Neck: Normal range of motion.  Cardiovascular: Normal rate, regular rhythm  and intact distal pulses.   Pulmonary/Chest: Effort normal. No respiratory distress.  Respirations even and unlabored. Lungs CTAB.  Abdominal: Soft. He exhibits no distension. There is no tenderness. There is no guarding.  Soft, obese, nontender abdomen.  Musculoskeletal: Normal range of motion.       Back:  Neurological: He is alert and oriented to person, place, and time.  Skin: Skin is warm and dry. No rash noted. He is not diaphoretic. No erythema. No pallor.  Psychiatric: He has a normal mood and affect. His behavior is normal.  Nursing note and vitals reviewed.    ED Treatments / Results  Labs (all labs ordered are listed, but only abnormal results are displayed) Labs Reviewed  COMPREHENSIVE METABOLIC PANEL - Abnormal; Notable for the following:       Result Value   Glucose, Bld 201 (*)    Creatinine, Ser 1.35 (*)    AST 45 (*)    GFR calc non Af Amer 59 (*)    All other components within normal limits  CBC - Abnormal; Notable for the following:    WBC 10.8 (*)    All other components within normal limits  URINALYSIS, ROUTINE W REFLEX MICROSCOPIC (NOT AT [facility]) - Abnormal; Notable for the following:    Glucose, UA 100 (*)    Bilirubin Urine SMALL (*)    Ketones, ur 15 (*)    Leukocytes, UA TRACE (*)    All other components within normal limits  URINE CULTURE  LIPASE, BLOOD  URINE MICROSCOPIC-ADD ON  URINE RAPID DRUG SCREEN, HOSP PERFORMED    EKG  EKG Interpretation None       Radiology US Scrotum  Result Date: 03/11/2016 CLINICAL DATA:  Scrotal pain on the left for 1 week EXAM: SCROTAL ULTRASOUND DOPPLER ULTRASOUND OF THE TESTICLES TECHNIQUE: Complete ultrasound examination of the testicles, epididymis, and other scrotal structures was performed. Color and spectral Doppler ultrasound were also utilized to evaluate blood flow to the testicles. COMPARISON:  None. FINDINGS: Right testicle Measurements: 4.5 x 2.1 x 3.0 cm. No mass or microlithiasis visualized. Left  testicle Measurements: 4.4 x 2.2 x 2.6 cm. No mass or microlithiasis visualized. Right epididymis: There is a cyst in the head of the epididymis measuring 4 x  5 x 4 mm. There is no inflammatory focus. Left epididymis:  Normal in size and appearance. Hydrocele:  There is a small hydrocele on each side. Varicocele: None visualized on the right. There is a small varicocele on the left. Pulsed Doppler interrogation of both testes demonstrates normal low resistance arterial and venous waveforms bilaterally. Peak systolic velocity in each testis is approximately 5 cm/sec. A small calcification is noted along the left scrotal sac. There is no scrotal wall thickening or scrotal abscess. IMPRESSION: Small cyst in the head of the right epididymis. There is no intratesticular mass or torsion. No inflammatory foci identified on either side. There are small varicoceles bilaterally. There is a small varicocele on the left. A small focus of calcification on the left in the scrotal sac region likely represents residua of prior trauma. No acute appearing traumatic lesion evident. Electronically Signed   By: Lowella Grip III M.D.   On: 03/11/2016 14:11   [place] Art/ven Flow Abd Pelv Doppler  Result Date: 03/11/2016 CLINICAL DATA:  Scrotal pain on the left for 1 week EXAM: SCROTAL ULTRASOUND DOPPLER ULTRASOUND OF THE TESTICLES TECHNIQUE: Complete ultrasound examination of the testicles, epididymis, and other scrotal structures was performed. Color and spectral Doppler ultrasound were also utilized to evaluate blood flow to the testicles. COMPARISON:  None. FINDINGS: Right testicle Measurements: 4.5 x 2.1 x 3.0 cm. No mass or microlithiasis visualized. Left testicle Measurements: 4.4 x 2.2 x 2.6 cm. No mass or microlithiasis visualized. Right epididymis: There is a cyst in the head of the epididymis measuring 4 x 5 x 4 mm. There is no inflammatory focus. Left epididymis:  Normal in size and appearance. Hydrocele:  There is a small  hydrocele on each side. Varicocele: None visualized on the right. There is a small varicocele on the left. Pulsed Doppler interrogation of both testes demonstrates normal low resistance arterial and venous waveforms bilaterally. Peak systolic velocity in each testis is approximately 5 cm/sec. A small calcification is noted along the left scrotal sac. There is no scrotal wall thickening or scrotal abscess. IMPRESSION: Small cyst in the head of the right epididymis. There is no intratesticular mass or torsion. No inflammatory foci identified on either side. There are small varicoceles bilaterally. There is a small varicocele on the left. A small focus of calcification on the left in the scrotal sac region likely represents residua of prior trauma. No acute appearing traumatic lesion evident. Electronically Signed   By: Lowella Grip III M.D.   On: 03/11/2016 14:11   Ct Renal Stone Study  Result Date: 03/11/2016 CLINICAL DATA:  Gradual onset left flank pain. Pain radiates to the left groin and left testis. EXAM: CT ABDOMEN AND PELVIS WITHOUT CONTRAST TECHNIQUE: Multidetector CT imaging of the abdomen and pelvis was performed following the standard protocol without IV contrast. COMPARISON:  03/03/2016 FINDINGS: Lower chest: No acute abnormality.  Coronary artery calcifications. Hepatobiliary: Diffuse fatty infiltration of the liver. Surgical absence of the gallbladder. No bile duct dilatation. Pancreas: Unenhanced appearance of the pancreas is unremarkable. Spleen: Unenhanced appearance of the spleen is unremarkable. Adrenals/Urinary Tract: No adrenal gland nodules. Kidneys appear symmetrical in size and shape. No hydronephrosis or hydroureter. No renal, ureteral, or bladder stones. No bladder wall thickening. Stomach/Bowel: Unenhanced appearance is unremarkable. Stool-filled colon without distention. Appendix is normal. Vascular/Lymphatic: Aortic atherosclerosis. No enlarged abdominal or pelvic lymph nodes.  Reproductive: Prostate gland is not enlarged. Other: Small left periumbilical hernia containing fat. No inguinal hernias in Musculoskeletal: Mild degenerative changes  in the spine. No destructive bone lesions. IMPRESSION: No renal or ureteral stone or obstruction. Diffuse fatty infiltration of the liver. Small periumbilical hernia containing fat. Electronically Signed   By: Lucienne Capers M.D.   On: 03/11/2016 00:59    Procedures Procedures (including critical care time)  Medications Ordered in ED Medications  oxyCODONE-acetaminophen (PERCOCET/ROXICET) 5-325 MG per tablet 1 tablet (1 tablet Oral Given 03/11/16 1955)  oxyCODONE-acetaminophen (PERCOCET/ROXICET) 5-325 MG per tablet (not administered)  ondansetron (ZOFRAN-ODT) 4 MG disintegrating tablet (not administered)  ondansetron (ZOFRAN-ODT) disintegrating tablet 4 mg (4 mg Oral Given 03/11/16 1958)     Initial Impression / Assessment and Plan / ED Course  I have reviewed the triage vital signs and the nursing notes.  Pertinent labs & imaging results that were available during my care of the patient were reviewed by me and considered in my medical decision making (see chart for details).  Clinical Course    50 year old male presents to the emergency department for worsening left flank pain. He was seen one week ago and diagnosed with a urinary tract infection. Upon review of the patient's urine culture, it appears as though his urinary tract infection is secondary to Escherichia coli and resistant to all antibiotics except Imipenem and PIP/Tazo.   I believe the patient's initial improvement in symptoms while on Keflex was due to partial treatment of a UTI. Worsening flank pain is likely due to incomplete treatment of infection and developing left pyelonephritis. Patient afebrile today. He does have a mild leukocytosis. Mild AKI. Plan to admit under observation for IV antibiotics and fluids. Case discussed with Dr. Hal Hope of [facility] who  will admit.   Final Clinical Impressions(s) / ED Diagnoses   Final diagnoses:  Pyelonephritis  AKI (acute kidney injury) [facility])    New Prescriptions New Prescriptions   No medications on file     Antonietta Breach, PA-C 03/11/16 2256    Drenda Freeze, MD 03/12/16 1145

## 2016-03-11 NOTE — ED Triage Notes (Signed)
Pt reports to the ED for eval of left flank pain that radiates into his left groin. Was seen at [facility] x 2 yesterday. He has had this pain x 1 week. Has been prescribed percocet 5s but is only getting minimal relief. Pt has had a CT scan and a U/S done yesterday. Pt denies any hematuria.

## 2016-03-12 DIAGNOSIS — Z79899 Other long term (current) drug therapy: Secondary | ICD-10-CM | POA: Diagnosis not present

## 2016-03-12 DIAGNOSIS — Z7984 Long term (current) use of oral hypoglycemic drugs: Secondary | ICD-10-CM | POA: Diagnosis not present

## 2016-03-12 DIAGNOSIS — I2510 Atherosclerotic heart disease of native coronary artery without angina pectoris: Secondary | ICD-10-CM | POA: Diagnosis present

## 2016-03-12 DIAGNOSIS — N12 Tubulo-interstitial nephritis, not specified as acute or chronic: Secondary | ICD-10-CM | POA: Diagnosis not present

## 2016-03-12 DIAGNOSIS — N50812 Left testicular pain: Secondary | ICD-10-CM | POA: Diagnosis present

## 2016-03-12 DIAGNOSIS — G894 Chronic pain syndrome: Secondary | ICD-10-CM | POA: Diagnosis present

## 2016-03-12 DIAGNOSIS — M5137 Other intervertebral disc degeneration, lumbosacral region: Secondary | ICD-10-CM | POA: Diagnosis present

## 2016-03-12 DIAGNOSIS — E1165 Type 2 diabetes mellitus with hyperglycemia: Secondary | ICD-10-CM | POA: Diagnosis present

## 2016-03-12 DIAGNOSIS — B9620 Unspecified Escherichia coli [E. coli] as the cause of diseases classified elsewhere: Secondary | ICD-10-CM | POA: Diagnosis present

## 2016-03-12 DIAGNOSIS — I252 Old myocardial infarction: Secondary | ICD-10-CM | POA: Diagnosis not present

## 2016-03-12 DIAGNOSIS — E785 Hyperlipidemia, unspecified: Secondary | ICD-10-CM | POA: Diagnosis present

## 2016-03-12 DIAGNOSIS — K219 Gastro-esophageal reflux disease without esophagitis: Secondary | ICD-10-CM | POA: Diagnosis present

## 2016-03-12 DIAGNOSIS — I10 Essential (primary) hypertension: Secondary | ICD-10-CM | POA: Diagnosis present

## 2016-03-12 DIAGNOSIS — N10 Acute tubulo-interstitial nephritis: Secondary | ICD-10-CM | POA: Diagnosis present

## 2016-03-12 DIAGNOSIS — N179 Acute kidney failure, unspecified: Secondary | ICD-10-CM

## 2016-03-12 DIAGNOSIS — B029 Zoster without complications: Secondary | ICD-10-CM | POA: Diagnosis present

## 2016-03-12 DIAGNOSIS — Z1612 Extended spectrum beta lactamase (ESBL) resistance: Secondary | ICD-10-CM | POA: Diagnosis present

## 2016-03-12 DIAGNOSIS — A499 Bacterial infection, unspecified: Secondary | ICD-10-CM | POA: Diagnosis not present

## 2016-03-12 LAB — COMPREHENSIVE METABOLIC PANEL
ALT: 19 U/L (ref 17–63)
ANION GAP: 8 (ref 5–15)
AST: 30 U/L (ref 15–41)
Albumin: 3.5 g/dL (ref 3.5–5.0)
Alkaline Phosphatase: 49 U/L (ref 38–126)
BUN: 12 mg/dL (ref 6–20)
CHLORIDE: 106 mmol/L (ref 101–111)
CO2: 23 mmol/L (ref 22–32)
CREATININE: 1.05 mg/dL (ref 0.61–1.24)
Calcium: 8.5 mg/dL — ABNORMAL LOW (ref 8.9–10.3)
Glucose, Bld: 137 mg/dL — ABNORMAL HIGH (ref 65–99)
POTASSIUM: 3.2 mmol/L — AB (ref 3.5–5.1)
SODIUM: 137 mmol/L (ref 135–145)
Total Bilirubin: 0.8 mg/dL (ref 0.3–1.2)
Total Protein: 6.1 g/dL — ABNORMAL LOW (ref 6.5–8.1)

## 2016-03-12 LAB — CBC WITH DIFFERENTIAL/PLATELET
Basophils Absolute: 0 10*3/uL (ref 0.0–0.1)
Basophils Relative: 0 %
EOS ABS: 0 10*3/uL (ref 0.0–0.7)
Eosinophils Relative: 0 %
HEMATOCRIT: 39.8 % (ref 39.0–52.0)
HEMOGLOBIN: 13.8 g/dL (ref 13.0–17.0)
LYMPHS ABS: 1.1 10*3/uL (ref 0.7–4.0)
LYMPHS PCT: 15 %
MCH: 30.8 pg (ref 26.0–34.0)
MCHC: 34.7 g/dL (ref 30.0–36.0)
MCV: 88.8 fL (ref 78.0–100.0)
MONOS PCT: 8 %
Monocytes Absolute: 0.6 10*3/uL (ref 0.1–1.0)
NEUTROS ABS: 5.8 10*3/uL (ref 1.7–7.7)
NEUTROS PCT: 77 %
PLATELETS: 228 10*3/uL (ref 150–400)
RBC: 4.48 MIL/uL (ref 4.22–5.81)
RDW: 12.2 % (ref 11.5–15.5)
WBC: 7.6 10*3/uL (ref 4.0–10.5)

## 2016-03-12 LAB — RAPID URINE DRUG SCREEN, HOSP PERFORMED
Amphetamines: NOT DETECTED
BARBITURATES: NOT DETECTED
BENZODIAZEPINES: NOT DETECTED
COCAINE: NOT DETECTED
OPIATES: POSITIVE — AB
Tetrahydrocannabinol: NOT DETECTED

## 2016-03-12 LAB — GLUCOSE, CAPILLARY
GLUCOSE-CAPILLARY: 139 mg/dL — AB (ref 65–99)
GLUCOSE-CAPILLARY: 180 mg/dL — AB (ref 65–99)
Glucose-Capillary: 122 mg/dL — ABNORMAL HIGH (ref 65–99)
Glucose-Capillary: 199 mg/dL — ABNORMAL HIGH (ref 65–99)
Glucose-Capillary: 227 mg/dL — ABNORMAL HIGH (ref 65–99)

## 2016-03-12 MED ORDER — LINAGLIPTIN 5 MG PO TABS
5.0000 mg | ORAL_TABLET | Freq: Every day | ORAL | Status: DC
  Administered 2016-03-12 – 2016-03-19 (×8): 5 mg via ORAL
  Filled 2016-03-12 (×8): qty 1

## 2016-03-12 MED ORDER — ASPIRIN EC 81 MG PO TBEC
81.0000 mg | DELAYED_RELEASE_TABLET | Freq: Every day | ORAL | Status: DC
  Administered 2016-03-12 – 2016-03-19 (×8): 81 mg via ORAL
  Filled 2016-03-12 (×8): qty 1

## 2016-03-12 MED ORDER — HYDRALAZINE HCL 20 MG/ML IJ SOLN: 10.0000 mg | INTRAMUSCULAR | Status: DC | PRN

## 2016-03-12 MED ORDER — HYDROMORPHONE HCL 1 MG/ML IJ SOLN
1.0000 mg | INTRAMUSCULAR | Status: DC | PRN
Start: 2016-03-12 — End: 2016-03-12

## 2016-03-12 MED ORDER — GLIMEPIRIDE 4 MG PO TABS
4.0000 mg | ORAL_TABLET | Freq: Every day | ORAL | Status: DC
  Filled 2016-03-12: qty 1

## 2016-03-12 MED ORDER — CYCLOBENZAPRINE HCL 5 MG PO TABS
5.0000 mg | ORAL_TABLET | Freq: Three times a day (TID) | ORAL | Status: DC | PRN
  Administered 2016-03-12 – 2016-03-13 (×3): 5 mg via ORAL
  Filled 2016-03-12 (×3): qty 1

## 2016-03-12 MED ORDER — ISOSORBIDE MONONITRATE ER 60 MG PO TB24
60.0000 mg | ORAL_TABLET | Freq: Every day | ORAL | Status: DC
  Administered 2016-03-12 – 2016-03-18 (×8): 60 mg via ORAL
  Filled 2016-03-12 (×9): qty 1

## 2016-03-12 MED ORDER — PANTOPRAZOLE SODIUM 40 MG PO TBEC
40.0000 mg | DELAYED_RELEASE_TABLET | Freq: Every day | ORAL | Status: DC
  Administered 2016-03-12 – 2016-03-19 (×8): 40 mg via ORAL
  Filled 2016-03-12 (×8): qty 1

## 2016-03-12 MED ORDER — ZOLPIDEM TARTRATE 5 MG PO TABS
5.0000 mg | ORAL_TABLET | Freq: Every evening | ORAL | Status: DC | PRN
  Administered 2016-03-12 – 2016-03-18 (×4): 5 mg via ORAL
  Filled 2016-03-12 (×5): qty 1

## 2016-03-12 MED ORDER — ACETAMINOPHEN 325 MG PO TABS
650.0000 mg | ORAL_TABLET | Freq: Four times a day (QID) | ORAL | Status: DC | PRN
  Administered 2016-03-18: 650 mg via ORAL
  Filled 2016-03-12: qty 2

## 2016-03-12 MED ORDER — ATORVASTATIN CALCIUM 20 MG PO TABS
20.0000 mg | ORAL_TABLET | Freq: Every day | ORAL | Status: DC
  Administered 2016-03-12 – 2016-03-18 (×8): 20 mg via ORAL
  Filled 2016-03-12 (×2): qty 1
  Filled 2016-03-12: qty 2
  Filled 2016-03-12 (×5): qty 1

## 2016-03-12 MED ORDER — OXYCODONE-ACETAMINOPHEN 5-325 MG PO TABS
1.0000 | ORAL_TABLET | Freq: Four times a day (QID) | ORAL | Status: DC | PRN
  Administered 2016-03-12: 1 via ORAL
  Administered 2016-03-12 – 2016-03-16 (×8): 2 via ORAL
  Filled 2016-03-12 (×4): qty 2
  Filled 2016-03-12: qty 1
  Filled 2016-03-12 (×4): qty 2

## 2016-03-12 MED ORDER — MAGNESIUM GLUCONATE 27.5 MG PO TABS: 27.5000 mg | ORAL_TABLET | Freq: Every day | ORAL | Status: DC | PRN

## 2016-03-12 MED ORDER — ONDANSETRON HCL 4 MG PO TABS
4.0000 mg | ORAL_TABLET | Freq: Four times a day (QID) | ORAL | Status: DC | PRN
  Administered 2016-03-16 (×3): 4 mg via ORAL
  Filled 2016-03-12 (×3): qty 1

## 2016-03-12 MED ORDER — HYDROMORPHONE HCL 1 MG/ML IJ SOLN
1.0000 mg | INTRAMUSCULAR | Status: AC | PRN
  Administered 2016-03-12 (×2): 1 mg via INTRAVENOUS
  Filled 2016-03-12 (×2): qty 1

## 2016-03-12 MED ORDER — NITROGLYCERIN 0.4 MG SL SUBL: 0.4000 mg | SUBLINGUAL_TABLET | SUBLINGUAL | Status: DC | PRN

## 2016-03-12 MED ORDER — AMLODIPINE BESYLATE 10 MG PO TABS
10.0000 mg | ORAL_TABLET | Freq: Every day | ORAL | Status: DC
  Administered 2016-03-12 – 2016-03-18 (×8): 10 mg via ORAL
  Filled 2016-03-12 (×8): qty 1

## 2016-03-12 MED ORDER — ACETAMINOPHEN 650 MG RE SUPP: 650.0000 mg | Freq: Four times a day (QID) | RECTAL | Status: DC | PRN

## 2016-03-12 MED ORDER — INSULIN ASPART 100 UNIT/ML ~~LOC~~ SOLN
0.0000 [IU] | Freq: Three times a day (TID) | SUBCUTANEOUS | Status: DC
  Administered 2016-03-12: 1 [IU] via SUBCUTANEOUS
  Administered 2016-03-12 – 2016-03-13 (×5): 2 [IU] via SUBCUTANEOUS
  Administered 2016-03-14: 3 [IU] via SUBCUTANEOUS
  Administered 2016-03-14: 5 [IU] via SUBCUTANEOUS
  Administered 2016-03-15: 1 [IU] via SUBCUTANEOUS
  Administered 2016-03-15 – 2016-03-16 (×4): 2 [IU] via SUBCUTANEOUS
  Administered 2016-03-16: 3 [IU] via SUBCUTANEOUS
  Administered 2016-03-17 (×2): 2 [IU] via SUBCUTANEOUS
  Administered 2016-03-17: 1 [IU] via SUBCUTANEOUS
  Administered 2016-03-18 (×3): 2 [IU] via SUBCUTANEOUS
  Administered 2016-03-19: 5 [IU] via SUBCUTANEOUS

## 2016-03-12 MED ORDER — ONDANSETRON HCL 4 MG/2ML IJ SOLN
4.0000 mg | Freq: Four times a day (QID) | INTRAMUSCULAR | Status: DC | PRN
  Administered 2016-03-13 – 2016-03-15 (×6): 4 mg via INTRAVENOUS
  Filled 2016-03-12 (×7): qty 2

## 2016-03-12 MED ORDER — ENOXAPARIN SODIUM 80 MG/0.8ML ~~LOC~~ SOLN
70.0000 mg | SUBCUTANEOUS | Status: DC
  Administered 2016-03-12 – 2016-03-19 (×8): 70 mg via SUBCUTANEOUS
  Filled 2016-03-12 (×8): qty 0.8

## 2016-03-12 MED ORDER — HYDROMORPHONE HCL 1 MG/ML IJ SOLN
1.0000 mg | INTRAMUSCULAR | Status: DC | PRN
  Administered 2016-03-12 – 2016-03-13 (×7): 1 mg via INTRAVENOUS
  Filled 2016-03-12 (×8): qty 1

## 2016-03-12 MED ORDER — SIMVASTATIN 40 MG PO TABS: 40.0000 mg | ORAL_TABLET | Freq: Every day | ORAL | Status: DC

## 2016-03-12 MED ORDER — SODIUM CHLORIDE 0.9 % IV SOLN
INTRAVENOUS | Status: AC
  Administered 2016-03-12 (×3): via INTRAVENOUS

## 2016-03-12 MED ORDER — ENOXAPARIN SODIUM 40 MG/0.4ML ~~LOC~~ SOLN: 40.0000 mg | SUBCUTANEOUS | Status: DC

## 2016-03-12 NOTE — Care Management Note (Addendum)
Case Management Note  Patient Details  Name: Jerome Shepherd MRN: [id number] Date of Birth: 1965/09/27  Subjective/Objective:             Patient admitted from home, independent, for flank pain after failed outpatient management. Pyelo, ESBL E.coli sensitive to zosyn and imipenem.   Anticipate coverage on IV Abx for 2 days and DC on PO. 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 He states that he has prescription coverage with his [employer] coverage,      Pharmacy [facility]  PCP Dr Woody Seller.   Action/Plan:  Continue to follow for DC planning.  03/19/16 DC to home, self care. No CM needs at DC.   Expected Discharge Date:  03/13/16               Expected Discharge Plan:  Home/Self Care  In-House Referral:  NA  Discharge planning Services  CM Consult  Post Acute Care Choice:  NA Choice offered to:  NA  DME Arranged:    DME Agency:     HH Arranged:    HH Agency:     Status of Service:  In process, will continue to follow  If discussed at Long Length of Stay Meetings, dates discussed:    Additional Comments:  Carles Collet, RN 03/12/2016, 11:11 AM

## 2016-03-12 NOTE — Progress Notes (Signed)
Pt admitted to room 511 alert and oriented x 4 moe x4 c/o pain to left flank an "8", on the  1-10 pain scale.no skin breakdown noted. I.V patent.Pt verbalises and demonstrated proper use of call bell.

## 2016-03-12 NOTE — Progress Notes (Signed)
Patient seen and examined,, admitted earlier this morning by Dr.Kakrakandy this is a 50 year old male with history of diabetes, hypertension, was seen in [facility] the emergency room on 9/10 with dysuria and urinary retention, diagnosed with urinary tract infection and discharged home on oral quinolone. After this he was seen in the emergency room and [facility] twice with low back pain, he had an unremarkable CT abdomen pelvis was discharged home with muscle relaxers. Came back to our ER yesterday with left flank pain, his urine culture from 9/10 is growing ESBL Escherichia coli only sensitive to imipenem. Now with pyelonephritis, continue IV Primaxin for a few days, once clinically improved will give him a dose of fosfomycin and discharged home. I have asked him to follow-up with urology for what seems like chronic symptoms of BPH with nocturia, intermittent urinary retention etc.  Jerome Polite, MD

## 2016-03-12 NOTE — ED Notes (Signed)
Attempted report x1. 

## 2016-03-12 NOTE — H&P (Signed)
History and Physical    Jerome Shepherd WGY:[id number] DOB: 20-Feb-1966 DOA: 03/11/2016  PCP: Glenda Chroman, MD  Patient coming from: Home.  Chief Complaint: Left flank pain.  HPI: Jerome Shepherd is a 50 y.o. male with diabetes mellitus type 2, hypertension, hyperlipidemia presents to the ER because of worsening left flank pain. Patient had originally come to the ER on September 10 at [facility] and was diagnosed with UTI and was discharged home on oral antibiotics. Patient subsequently returned back again and had CT renal studies which was negative. Patient over the last 1 week has had 2 CT studies which were not showing anything acute. Patient states when he took oral antibiotics pain did improve but over the last 3 days pain started recurring again when he was trying to mow the yard. Pain is left flank radiating to the left groin. Sonogram of the scrotum was unremarkable. Urine cultures done recently shows Escherichia coli only sensitive to Primaxin and Zosyn. Patient is admitted for further management for UTI with IV antibiotics and left flank pain.  ED Course: See history of presenting illness.  Review of Systems: As per HPI, rest all negative.   Past Medical History:  Diagnosis Date  . 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 CAD (coronary artery disease), minimal, non obstructive 2013 01/01/2013  . Constipation   . Coronary artery spasm ([place]) 2013  . Diabetes mellitus   . GERD (gastroesophageal reflux disease)   . Hyperlipidemia   . Hypertension   . Myocardial infarction [facility])    2012    Past Surgical History:  Procedure Laterality Date  . CARDIAC CATHETERIZATION  2013   non obstructive CAD  ~20%  . CHOLECYSTECTOMY N/A 04/01/2014   Procedure: LAPAROSCOPIC CHOLECYSTECTOMY;  Surgeon: Jamesetta So, MD;  Location: [facility] ORS;  Service: General;  Laterality: N/A;  . ESOPHAGOGASTRODUODENOSCOPY N/A 01/10/2014   Dr. Gala Romney: antral  erosions, path with chronic inactive gastritis. Empiric dilation with 56-F dilation  . ESOPHAGOGASTRODUODENOSCOPY (EGD) WITH PROPOFOL N/A 02/16/2016   Procedure: ESOPHAGOGASTRODUODENOSCOPY (EGD) WITH PROPOFOL;  Surgeon: Daneil Dolin, MD;  Location: [facility] ENDO SUITE;  Service: Endoscopy;  Laterality: N/A;  915  . KNEE ARTHROSCOPY Right 2002  . LEFT HEART CATH N/A 08/18/2011   Procedure: LEFT HEART CATH;  Surgeon: Lorretta Harp, MD;  Location[facility] C[facility] LAB;  Service: Cardiovascular;  Laterality: N/A;  . LIVER BIOPSY N/A 04/01/2014   mild fibrosis  . MALONEY DILATION N/A 01/10/2014   Procedure: Venia Minks DILATION;  Surgeon: Daneil Dolin, MD;  Location: [facility] ENDO SUITE;  Service: Endoscopy;  Laterality: N/A;  Venia Minks DILATION N/A 02/16/2016   Procedure: Venia Minks DILATION;  Surgeon: Daneil Dolin, MD;  Location: [facility] ENDO SUITE;  Service: Endoscopy;  Laterality: N/A;  . SAVORY DILATION N/A 01/10/2014   Procedure: SAVORY DILATION;  Surgeon: Daneil Dolin, MD;  Location: [facility] ENDO SUITE;  Service: Endoscopy;  Laterality: N/A;     reports that he quit smoking about 20 years ago. He has never used smokeless tobacco. He reports that he does not drink alcohol or use drugs.  Allergies  Allergen Reactions  . Codeine Nausea Only  . Sulfa Antibiotics Nausea Only    Family History  Problem Relation Age of Onset  . Diabetes type II Sister   . Heart attack Cousin   . Colon cancer Neg Hx   . Stomach cancer Neg Hx  Prior to Admission medications   Medication Sig Start Date End Date Taking? Authorizing Provider  amLODipine (NORVASC) 10 MG tablet Take 1 tablet (10 mg total) by mouth daily. Patient taking differently: Take 10 mg by mouth at bedtime.  07/18/15  Yes Herminio Commons, MD  aspirin EC 81 MG tablet Take 1 tablet (81 mg total) by mouth daily. 11/23/15  Yes Herminio Commons, MD  Azilsartan Medoxomil (EDARBI) 40 MG TABS Take 1 tablet by mouth daily. Patient taking differently: Take 40 mg by  mouth daily.  11/23/15  Yes Herminio Commons, MD  cephALEXin (KEFLEX) 500 MG capsule Take 1 capsule (500 mg total) by mouth 4 (four) times daily. Patient taking differently: Take 500 mg by mouth 4 (four) times daily. 7 day course filled 03/03/16 03/03/16  Yes Milton Ferguson, MD  cyclobenzaprine (FLEXERIL) 5 MG tablet Take 1 tablet (5 mg total) by mouth 3 (three) times daily as needed. Patient taking differently: Take 5 mg by mouth 3 (three) times daily as needed for muscle spasms.  03/10/16  Yes Rolland Porter, MD  DiphenhydrAMINE HCl, Sleep, (SIMPLY SLEEP PO) Take 1-2 tablets by mouth at bedtime as needed (sleep).   Yes Historical Provider, MD  glimepiride (AMARYL) 4 MG tablet Take 4 mg by mouth daily.  12/20/15  Yes Historical Provider, MD  glyBURIDE micronized (GLYNASE) 6 MG tablet Take 6 mg by mouth daily with breakfast. Reported on 12/25/2015   Yes Historical Provider, MD  HYDROcodone-acetaminophen (NORCO/VICODIN) 5-325 MG tablet Take 1 tablet by mouth every 6 (six) hours as needed. Patient taking differently: Take 1 tablet by mouth every 6 (six) hours as needed for moderate pain.  03/03/16  Yes Milton Ferguson, MD  ibuprofen (ADVIL,MOTRIN) 200 MG tablet Take 400 mg by mouth 2 (two) times daily as needed for moderate pain.    Yes Historical Provider, MD  isosorbide mononitrate (IMDUR) 60 MG 24 hr tablet Take 1 tablet (60 mg total) by mouth daily. Patient taking differently: Take 60 mg by mouth at bedtime.  07/18/15  Yes Herminio Commons, MD  loperamide (IMODIUM A-D) 2 MG tablet Take 2 mg by mouth 4 (four) times daily as needed for diarrhea or loose stools.   Yes Historical Provider, MD  Magnesium Gluconate 27.5 MG TABS Take 27.5 mg by mouth daily as needed (for cramps). Reported on 12/25/2015   Yes Historical Provider, MD  metFORMIN (GLUCOPHAGE) 500 MG tablet Take 500 mg by mouth daily with breakfast.    Yes Historical Provider, MD  naproxen (NAPROSYN) 500 MG tablet Take 1 po BID with food prn pain Patient  taking differently: Take 500 mg by mouth daily.  03/10/16  Yes Rolland Porter, MD  nitroGLYCERIN (NITROSTAT) 0.4 MG SL tablet Place 1 tablet (0.4 mg total) under the tongue every 5 (five) minutes as needed for chest pain. 07/18/15  Yes Herminio Commons, MD  ondansetron (ZOFRAN ODT) 4 MG disintegrating tablet 5m ODT q4 hours prn nausea/vomit Patient taking differently: Take 4 mg by mouth every 4 (four) hours as needed for nausea or vomiting. 423mODT q4 hours prn nausea/vomit 03/03/16  Yes JoMilton FergusonMD  oxyCODONE-acetaminophen (PERCOCET) 5-325 MG tablet Take 1-2 tablets by mouth every 6 (six) hours as needed. Patient taking differently: Take 1-2 tablets by mouth every 6 (six) hours as needed (pain).  03/11/16  Yes DoVeryl SpeakMD  pantoprazole (PROTONIX) 40 MG tablet Take 40 mg by mouth daily.   Yes Historical Provider, MD  ranitidine (ZANTAC) 150 MG  tablet Take 300 mg by mouth 5 (five) times daily as needed for heartburn.    Yes Historical Provider, MD  simvastatin (ZOCOR) 40 MG tablet Take 40 mg by mouth at bedtime.    Yes Historical Provider, MD  sitaGLIPtin (JANUVIA) 100 MG tablet Take 100 mg by mouth daily.   Yes Historical Provider, MD    Physical Exam: Vitals:   03/11/16 2200 03/11/16 2201 03/11/16 2245 03/11/16 2315  BP: 123/67 123/67 158/83 132/64  Pulse: 84 89 86 83  Resp:  18    Temp:      TempSrc:      SpO2: 97% 97% 97% 94%      Constitutional: Not in distress. Vitals:   03/11/16 2200 03/11/16 2201 03/11/16 2245 03/11/16 2315  BP: 123/67 123/67 158/83 132/64  Pulse: 84 89 86 83  Resp:  18    Temp:      TempSrc:      SpO2: 97% 97% 97% 94%   Eyes: Anicteric. No pallor. ENMT: No discharge from the ears eyes nose or mouth. Neck: No mass felt. Respiratory: No rhonchi or crepitations. Cardiovascular: S1 and S2 heard. Abdomen: Left flank pain. Musculoskeletal: No edema. Skin: No rash. Neurologic: Alert awake oriented to time place and person. Moves all  extremities. Psychiatric: Appears normal.   Labs on Admission: I have personally reviewed following labs and imaging studies  CBC:  Recent Labs Lab 03/10/16 2256 03/11/16 1650  WBC 7.8 10.8*  NEUTROABS 4.2  --   HGB 14.6 15.3  HCT 40.1 44.0  MCV 87.2 88.9  PLT 243 751   Basic Metabolic Panel:  Recent Labs Lab 03/10/16 2256 03/11/16 1650  NA 137 136  K 3.4* 3.9  CL 105 103  CO2 24 24  GLUCOSE 124* 201*  BUN 18 16  CREATININE 1.05 1.35*  CALCIUM 9.0 9.1   GFR: Estimated Creatinine Clearance: 100.9 mL/min (by C-G formula based on SCr of 1.35 mg/dL (H)). Liver Function Tests:  Recent Labs Lab 03/11/16 1650  AST 45*  ALT 26  ALKPHOS 61  BILITOT 0.6  PROT 6.6  ALBUMIN 4.0    Recent Labs Lab 03/11/16 1650  LIPASE 38   No results for input(s): AMMONIA in the last 168 hours. Coagulation Profile: No results for input(s): INR, PROTIME in the last 168 hours. Cardiac Enzymes: No results for input(s): CKTOTAL, CKMB, CKMBINDEX, TROPONINI in the last 168 hours. BNP (last 3 results) No results for input(s): PROBNP in the last 8760 hours. HbA1C: No results for input(s): HGBA1C in the last 72 hours. CBG: No results for input(s): GLUCAP in the last 168 hours. Lipid Profile: No results for input(s): CHOL, HDL, LDLCALC, TRIG, CHOLHDL, LDLDIRECT in the last 72 hours. Thyroid Function Tests: No results for input(s): TSH, T4TOTAL, FREET4, T3FREE, THYROIDAB in the last 72 hours. Anemia Panel: No results for input(s): VITAMINB12, FOLATE, FERRITIN, TIBC, IRON, RETICCTPCT in the last 72 hours. Urine analysis:    Component Value Date/Time   COLORURINE YELLOW 03/11/2016 [place] 03/11/2016 1749   LABSPEC 1.028 03/11/2016 1749   PHURINE 5.5 03/11/2016 1749   GLUCOSEU 100 (A) 03/11/2016 1749   HGBUR NEGATIVE 03/11/2016 1749   BILIRUBINUR SMALL (A) 03/11/2016 1749   KETONESUR 15 (A) 03/11/2016 1749   PROTEINUR NEGATIVE 03/11/2016 1749   NITRITE  NEGATIVE 03/11/2016 1749   LEUKOCYTESUR TRACE (A) 03/11/2016 1749   Sepsis Labs: @LABRCNTIP (procalcitonin:4,lacticidven:4) ) Recent Results (from the past 240 hour(s))  Urine culture  Status: Abnormal   Collection Time: 03/03/16  4:10 PM  Result Value Ref Range Status   Specimen Description URINE, CLEAN CATCH  Final   Special Requests NONE  Final   Culture (A)  Final    >=100,000 COLONIES/mL ESCHERICHIA COLI Confirmed Extended Spectrum Beta-Lactamase Producer (ESBL) Performed at [facility]    Report Status 03/07/2016 FINAL  Final   Organism ID, Bacteria ESCHERICHIA COLI (A)  Final      Susceptibility   Escherichia coli - MIC*    AMPICILLIN >=32 RESISTANT Resistant     CEFAZOLIN >=64 RESISTANT Resistant     CEFTRIAXONE >=64 RESISTANT Resistant     CIPROFLOXACIN >=4 RESISTANT Resistant     GENTAMICIN >=16 RESISTANT Resistant     IMIPENEM <=0.25 SENSITIVE Sensitive     NITROFURANTOIN 128 RESISTANT Resistant     TRIMETH/SULFA >=320 RESISTANT Resistant     AMPICILLIN/SULBACTAM >=32 RESISTANT Resistant     PIP/TAZO 8 SENSITIVE Sensitive     Extended ESBL POSITIVE Resistant     * >=100,000 COLONIES/mL ESCHERICHIA COLI     Radiological Exams on Admission: US Scrotum  Result Date: 03/11/2016 CLINICAL DATA:  Scrotal pain on the left for 1 week EXAM: SCROTAL ULTRASOUND DOPPLER ULTRASOUND OF THE TESTICLES TECHNIQUE: Complete ultrasound examination of the testicles, epididymis, and other scrotal structures was performed. Color and spectral Doppler ultrasound were also utilized to evaluate blood flow to the testicles. COMPARISON:  None. FINDINGS: Right testicle Measurements: 4.5 x 2.1 x 3.0 cm. No mass or microlithiasis visualized. Left testicle Measurements: 4.4 x 2.2 x 2.6 cm. No mass or microlithiasis visualized. Right epididymis: There is a cyst in the head of the epididymis measuring 4 x 5 x 4 mm. There is no inflammatory focus. Left epididymis:  Normal in size and  appearance. Hydrocele:  There is a small hydrocele on each side. Varicocele: None visualized on the right. There is a small varicocele on the left. Pulsed Doppler interrogation of both testes demonstrates normal low resistance arterial and venous waveforms bilaterally. Peak systolic velocity in each testis is approximately 5 cm/sec. A small calcification is noted along the left scrotal sac. There is no scrotal wall thickening or scrotal abscess. IMPRESSION: Small cyst in the head of the right epididymis. There is no intratesticular mass or torsion. No inflammatory foci identified on either side. There are small varicoceles bilaterally. There is a small varicocele on the left. A small focus of calcification on the left in the scrotal sac region likely represents residua of prior trauma. No acute appearing traumatic lesion evident. Electronically Signed   By: Lowella Grip III M.D.   On: 03/11/2016 14:11   [place] Art/ven Flow Abd Pelv Doppler  Result Date: 03/11/2016 CLINICAL DATA:  Scrotal pain on the left for 1 week EXAM: SCROTAL ULTRASOUND DOPPLER ULTRASOUND OF THE TESTICLES TECHNIQUE: Complete ultrasound examination of the testicles, epididymis, and other scrotal structures was performed. Color and spectral Doppler ultrasound were also utilized to evaluate blood flow to the testicles. COMPARISON:  None. FINDINGS: Right testicle Measurements: 4.5 x 2.1 x 3.0 cm. No mass or microlithiasis visualized. Left testicle Measurements: 4.4 x 2.2 x 2.6 cm. No mass or microlithiasis visualized. Right epididymis: There is a cyst in the head of the epididymis measuring 4 x 5 x 4 mm. There is no inflammatory focus. Left epididymis:  Normal in size and appearance. Hydrocele:  There is a small hydrocele on each side. Varicocele: None visualized on the right. There is a small varicocele  on the left. Pulsed Doppler interrogation of both testes demonstrates normal low resistance arterial and venous waveforms bilaterally. Peak  systolic velocity in each testis is approximately 5 cm/sec. A small calcification is noted along the left scrotal sac. There is no scrotal wall thickening or scrotal abscess. IMPRESSION: Small cyst in the head of the right epididymis. There is no intratesticular mass or torsion. No inflammatory foci identified on either side. There are small varicoceles bilaterally. There is a small varicocele on the left. A small focus of calcification on the left in the scrotal sac region likely represents residua of prior trauma. No acute appearing traumatic lesion evident. Electronically Signed   By: Lowella Grip III M.D.   On: 03/11/2016 14:11   Ct Renal Stone Study  Result Date: 03/11/2016 CLINICAL DATA:  Gradual onset left flank pain. Pain radiates to the left groin and left testis. EXAM: CT ABDOMEN AND PELVIS WITHOUT CONTRAST TECHNIQUE: Multidetector CT imaging of the abdomen and pelvis was performed following the standard protocol without IV contrast. COMPARISON:  03/03/2016 FINDINGS: Lower chest: No acute abnormality.  Coronary artery calcifications. Hepatobiliary: Diffuse fatty infiltration of the liver. Surgical absence of the gallbladder. No bile duct dilatation. Pancreas: Unenhanced appearance of the pancreas is unremarkable. Spleen: Unenhanced appearance of the spleen is unremarkable. Adrenals/Urinary Tract: No adrenal gland nodules. Kidneys appear symmetrical in size and shape. No hydronephrosis or hydroureter. No renal, ureteral, or bladder stones. No bladder wall thickening. Stomach/Bowel: Unenhanced appearance is unremarkable. Stool-filled colon without distention. Appendix is normal. Vascular/Lymphatic: Aortic atherosclerosis. No enlarged abdominal or pelvic lymph nodes. Reproductive: Prostate gland is not enlarged. Other: Small left periumbilical hernia containing fat. No inguinal hernias in Musculoskeletal: Mild degenerative changes in the spine. No destructive bone lesions. IMPRESSION: No renal or  ureteral stone or obstruction. Diffuse fatty infiltration of the liver. Small periumbilical hernia containing fat. Electronically Signed   By: Lucienne Capers M.D.   On: 03/11/2016 00:59     Assessment/Plan Principal Problem:   Pyelonephritis Active Problems:   Diabetes mellitus type 2, controlled ([place])   HTN (hypertension)   ARF (acute renal failure) ([place])    1. Left flank pain most likely secondary to pyelonephritis - recent urine culture shows Escherichia coli sensitive to IV imipenem and Zosyn only. For now patient is placed on imipenem. Gently hydrate. Other differentials include musculoskeletal pain. 2. Diabetes mellitus type 2 - on metformin while inpatient and continue Amaryl and Januvia and patient is on sliding scale coverage. 3. Acute renal failure - probably from poor oral intake last 2-3 days. Gently hydrate. Hold ARB for now. 4. Hypertension - continue amlodipine and Imdur. Holding ARB due to #3. When necessary IV hydralazine. 5. Hyperlipidemia on statins.   DVT prophylaxis: Lovenox. Code Status: Full code.  Family Communication: Discussed with patient.  Disposition Plan: Home.  Consults called: None.  Admission status: Observation.    Rise Patience MD [facility] Pager [phone].  If 7PM-7AM, please contact night-coverage [email or web address] Password [email or web address]  03/12/2016, 12:03 AM

## 2016-03-13 LAB — URINE CULTURE: Culture: NO GROWTH

## 2016-03-13 LAB — BASIC METABOLIC PANEL
ANION GAP: 6 (ref 5–15)
BUN: 9 mg/dL (ref 6–20)
CALCIUM: 8.7 mg/dL — AB (ref 8.9–10.3)
CHLORIDE: 103 mmol/L (ref 101–111)
CO2: 26 mmol/L (ref 22–32)
Creatinine, Ser: 1.09 mg/dL (ref 0.61–1.24)
GFR calc non Af Amer: >60 mL/min (ref >60)
GLUCOSE: 184 mg/dL — AB (ref 65–99)
POTASSIUM: 4.1 mmol/L (ref 3.5–5.1)
Sodium: 135 mmol/L (ref 135–145)

## 2016-03-13 LAB — GLUCOSE, CAPILLARY
GLUCOSE-CAPILLARY: 174 mg/dL — AB (ref 65–99)
GLUCOSE-CAPILLARY: 178 mg/dL — AB (ref 65–99)
Glucose-Capillary: 155 mg/dL — ABNORMAL HIGH (ref 65–99)
Glucose-Capillary: 207 mg/dL — ABNORMAL HIGH (ref 65–99)

## 2016-03-13 LAB — CBC
HEMATOCRIT: 38.5 % — AB (ref 39.0–52.0)
HEMOGLOBIN: 13.2 g/dL (ref 13.0–17.0)
MCH: 30.5 pg (ref 26.0–34.0)
MCHC: 34.3 g/dL (ref 30.0–36.0)
MCV: 88.9 fL (ref 78.0–100.0)
Platelets: 243 10*3/uL (ref 150–400)
RBC: 4.33 MIL/uL (ref 4.22–5.81)
RDW: 12.2 % (ref 11.5–15.5)
WBC: 6.8 10*3/uL (ref 4.0–10.5)

## 2016-03-13 MED ORDER — HYDROMORPHONE HCL 1 MG/ML IJ SOLN
2.0000 mg | INTRAMUSCULAR | Status: DC | PRN
  Administered 2016-03-13 – 2016-03-14 (×10): 2 mg via INTRAVENOUS
  Filled 2016-03-13 (×10): qty 2

## 2016-03-13 MED ORDER — SODIUM CHLORIDE 0.9 % IV SOLN
INTRAVENOUS | Status: DC
  Administered 2016-03-14: 1000 mL via INTRAVENOUS
  Administered 2016-03-15 – 2016-03-17 (×4): via INTRAVENOUS

## 2016-03-13 MED ORDER — OXYCODONE-ACETAMINOPHEN 5-325 MG PO TABS
2.0000 | ORAL_TABLET | Freq: Once | ORAL | Status: AC
  Administered 2016-03-13: 2 via ORAL
  Filled 2016-03-13: qty 2

## 2016-03-13 NOTE — Progress Notes (Signed)
PROGRESS NOTE    Jerome Shepherd  GLO:[id number] DOB: [birth year] DOA: 03/11/2016 PCP: Glenda Chroman, MD  Brief Narrative: Jerome Shepherd is a 50 y.o. male with diabetes mellitus type 2, hypertension, hyperlipidemia presents to the ER because of worsening left flank pain. Patient had originally come to the ER on September 10 at [facility] and was diagnosed with UTI and was discharged home on oral antibiotics. Patient subsequently returned back again and had CT renal studies which was negative. Patient over the last 1 week has had 2 CT studies which were not showing anything acute. Patient reported taking antibiotics pain did improve but over the last 3 days pain started recurring again when he was trying to mow the yard. In ER, admitted due to persistent L flank pain and growing ESBL Ecoli from urine Cx  Assessment & Plan:  1. L flank pain most likely due to ESBL Ecoli pyelonephritis -L flank pain for last week, had been on PO Abx and grew ESBL Ecoli -CT scan on 9/10 and 9/17 reviewed, no evidence of calculi -continue IV primaxin for few more days and could give a dose of fosfomycin if medically ready for discharge  -if low back pain not improved by tomorrow will consider MRI LS Spine -supportive care  2. Diabetes mellitus type 2 - -hold metformin and amaryl  -continue SSI  3. Acute renal failure  - probably from poor oral intake last 2-3 days and ARB -resolved with hydration  4. Hypertension  - continue amlodipine and Imdur  5. Hyperlipidemia on statins.  DVT prophylaxis: Lovenox.  Code Status: Full code.  Family Communication: Discussed with patient.  Disposition Plan: Home when improved  Antimicrobials:Primaxin 9/19   Subjective: Still having R flank pain  Objective: Vitals:   03/12/16 0547 03/12/16 2138 03/13/16 0503 03/13/16 0506  BP: 122/75 135/70 (!) 110/45 109/63  Pulse: 73 72 63 61  Resp: 18 18 18    Temp: 97.5 F (36.4 C) 98.4 F (36.9 C) 98.1 F (36.7  C)   TempSrc: Oral Oral    SpO2: 95% 97% 93%   Weight: (!) 140.9 kg (310 lb 11.2 oz)  (!) 140.4 kg (309 lb 8 oz)   Height: 6' 4"  (1.93 m)       Intake/Output Summary (Last 24 hours) at 03/13/16 1458 Last data filed at 03/13/16 0947  Gross per 24 hour  Intake             1300 ml  Output                0 ml  Net             1300 ml   Filed Weights   03/12/16 0547 03/13/16 0503  Weight: (!) 140.9 kg (310 lb 11.2 oz) (!) 140.4 kg (309 lb 8 oz)    Examination:  General exam: Appears calm and comfortable, AAOx3, no distress Respiratory system: Clear to auscultation. Respiratory effort normal. Cardiovascular system: S1 & S2 heard, RRR. No JVD, murmurs, rubs, gallops or clicks. No pedal edema. Gastrointestinal system: Abdomen is nondistended, soft and nontender. R flank tenderness Central nervous system: Alert and oriented. No focal neurological deficits. Extremities: Symmetric 5 x 5 power. Skin: No rashes, lesions or ulcers Psychiatry: Judgement and insight appear normal. Mood & affect appropriate.   Data Reviewed: I have personally reviewed following labs and imaging studies  CBC:  Recent Labs Lab 03/10/16 2256 03/11/16 1650 03/12/16 0403 03/13/16 0559  WBC 7.8 10.8*  7.6 6.8  NEUTROABS 4.2  --  5.8  --   HGB 14.6 15.3 13.8 13.2  HCT 40.1 44.0 39.8 38.5*  MCV 87.2 88.9 88.8 88.9  PLT 243 284 228 078   Basic Metabolic Panel:  Recent Labs Lab 03/10/16 2256 03/11/16 1650 03/12/16 0403 03/13/16 0559  NA 137 136 137 135  K 3.4* 3.9 3.2* 4.1  CL 105 103 106 103  CO2 24 24 23 26   GLUCOSE 124* 201* 137* 184*  BUN 18 16 12 9   CREATININE 1.05 1.35* 1.05 1.09  CALCIUM 9.0 9.1 8.5* 8.7*   GFR: Estimated Creatinine Clearance: 122.7 mL/min (by C-G formula based on SCr of 1.09 mg/dL). Liver Function Tests:  Recent Labs Lab 03/11/16 1650 03/12/16 0403  AST 45* 30  ALT 26 19  ALKPHOS 61 49  BILITOT 0.6 0.8  PROT 6.6 6.1*  ALBUMIN 4.0 3.5    Recent Labs Lab  03/11/16 1650  LIPASE 38   No results for input(s): AMMONIA in the last 168 hours. Coagulation Profile: No results for input(s): INR, PROTIME in the last 168 hours. Cardiac Enzymes: No results for input(s): CKTOTAL, CKMB, CKMBINDEX, TROPONINI in the last 168 hours. BNP (last 3 results) No results for input(s): PROBNP in the last 8760 hours. HbA1C: No results for input(s): HGBA1C in the last 72 hours. CBG:  Recent Labs Lab 03/12/16 1222 03/12/16 1709 03/12/16 2105 03/13/16 0822 03/13/16 1205  GLUCAP 180* 199* 227* 155* 174*   Lipid Profile: No results for input(s): CHOL, HDL, LDLCALC, TRIG, CHOLHDL, LDLDIRECT in the last 72 hours. Thyroid Function Tests: No results for input(s): TSH, T4TOTAL, FREET4, T3FREE, THYROIDAB in the last 72 hours. Anemia Panel: No results for input(s): VITAMINB12, FOLATE, FERRITIN, TIBC, IRON, RETICCTPCT in the last 72 hours. Urine analysis:    Component Value Date/Time   COLORURINE YELLOW 03/11/2016 [place] 03/11/2016 1749   LABSPEC 1.028 03/11/2016 1749   PHURINE 5.5 03/11/2016 1749   GLUCOSEU 100 (A) 03/11/2016 1749   HGBUR NEGATIVE 03/11/2016 1749   BILIRUBINUR SMALL (A) 03/11/2016 1749   KETONESUR 15 (A) 03/11/2016 1749   PROTEINUR NEGATIVE 03/11/2016 1749   NITRITE NEGATIVE 03/11/2016 1749   LEUKOCYTESUR TRACE (A) 03/11/2016 1749   Sepsis Labs: @LABRCNTIP (procalcitonin:4,lacticidven:4)  ) Recent Results (from the past 240 hour(s))  Urine culture     Status: Abnormal   Collection Time: 03/03/16  4:10 PM  Result Value Ref Range Status   Specimen Description URINE, CLEAN CATCH  Final   Special Requests NONE  Final   Culture (A)  Final    >=100,000 COLONIES/mL ESCHERICHIA COLI Confirmed Extended Spectrum Beta-Lactamase Producer (ESBL) Performed at [facility]    Report Status 03/07/2016 FINAL  Final   Organism ID, Bacteria ESCHERICHIA COLI (A)  Final      Susceptibility   Escherichia coli - MIC*     AMPICILLIN >=32 RESISTANT Resistant     CEFAZOLIN >=64 RESISTANT Resistant     CEFTRIAXONE >=64 RESISTANT Resistant     CIPROFLOXACIN >=4 RESISTANT Resistant     GENTAMICIN >=16 RESISTANT Resistant     IMIPENEM <=0.25 SENSITIVE Sensitive     NITROFURANTOIN 128 RESISTANT Resistant     TRIMETH/SULFA >=320 RESISTANT Resistant     AMPICILLIN/SULBACTAM >=32 RESISTANT Resistant     PIP/TAZO 8 SENSITIVE Sensitive     Extended ESBL POSITIVE Resistant     * >=100,000 COLONIES/mL ESCHERICHIA COLI  Urine culture     Status: None   Collection  Time: 03/11/16  5:49 PM  Result Value Ref Range Status   Specimen Description URINE, RANDOM  Final   Special Requests NONE  Final   Culture NO GROWTH  Final   Report Status 03/13/2016 FINAL  Final         Radiology Studies: No results found.      Scheduled Meds: . amLODipine  10 mg Oral QHS  . aspirin EC  81 mg Oral Daily  . atorvastatin  20 mg Oral q1800  . enoxaparin (LOVENOX) injection  70 mg Subcutaneous Q24H  . imipenem-cilastatin  1,000 mg Intravenous Q8H  . insulin aspart  0-9 Units Subcutaneous TID WC  . isosorbide mononitrate  60 mg Oral QHS  . linagliptin  5 mg Oral Daily  . pantoprazole  40 mg Oral Daily   Continuous Infusions: . sodium chloride 50 mL/hr at 03/13/16 1045     LOS: 1 day    Time spent: 52mn    PDomenic Polite MD [facility] Pager [phone] If 7PM-7AM, please contact night-coverage [email or web address] Password [email or web address] 03/13/2016, 2:58 PM

## 2016-03-13 NOTE — Progress Notes (Signed)
Notified for Primaxen. High priority.

## 2016-03-14 ENCOUNTER — Inpatient Hospital Stay (HOSPITAL_COMMUNITY): Payer: No Typology Code available for payment source

## 2016-03-14 DIAGNOSIS — IMO0002 Reserved for concepts with insufficient information to code with codable children: Secondary | ICD-10-CM

## 2016-03-14 DIAGNOSIS — A499 Bacterial infection, unspecified: Secondary | ICD-10-CM

## 2016-03-14 DIAGNOSIS — E1165 Type 2 diabetes mellitus with hyperglycemia: Secondary | ICD-10-CM

## 2016-03-14 DIAGNOSIS — N10 Acute tubulo-interstitial nephritis: Secondary | ICD-10-CM

## 2016-03-14 DIAGNOSIS — G894 Chronic pain syndrome: Secondary | ICD-10-CM

## 2016-03-14 DIAGNOSIS — E118 Type 2 diabetes mellitus with unspecified complications: Secondary | ICD-10-CM

## 2016-03-14 DIAGNOSIS — N179 Acute kidney failure, unspecified: Secondary | ICD-10-CM

## 2016-03-14 DIAGNOSIS — Z1612 Extended spectrum beta lactamase (ESBL) resistance: Secondary | ICD-10-CM

## 2016-03-14 LAB — GLUCOSE, CAPILLARY
GLUCOSE-CAPILLARY: 237 mg/dL — AB (ref 65–99)
GLUCOSE-CAPILLARY: 214 mg/dL — AB (ref 65–99)
GLUCOSE-CAPILLARY: 258 mg/dL — AB (ref 65–99)
GLUCOSE-CAPILLARY: 172 mg/dL — AB (ref 65–99)
Glucose-Capillary: 219 mg/dL — ABNORMAL HIGH (ref 65–99)

## 2016-03-14 LAB — CBC
HCT: 39.8 % (ref 39.0–52.0)
Hemoglobin: 13.7 g/dL (ref 13.0–17.0)
MCH: 30.4 pg (ref 26.0–34.0)
MCHC: 34.4 g/dL (ref 30.0–36.0)
MCV: 88.4 fL (ref 78.0–100.0)
PLATELETS: 246 10*3/uL (ref 150–400)
RBC: 4.5 MIL/uL (ref 4.22–5.81)
RDW: 12.3 % (ref 11.5–15.5)
WBC: 7.5 10*3/uL (ref 4.0–10.5)

## 2016-03-14 LAB — BASIC METABOLIC PANEL
Anion gap: 7 (ref 5–15)
BUN: 10 mg/dL (ref 6–20)
CALCIUM: 8.7 mg/dL — AB (ref 8.9–10.3)
CHLORIDE: 102 mmol/L (ref 101–111)
CO2: 26 mmol/L (ref 22–32)
CREATININE: 0.99 mg/dL (ref 0.61–1.24)
GFR calc non Af Amer: >60 mL/min (ref >60)
Glucose, Bld: 242 mg/dL — ABNORMAL HIGH (ref 65–99)
Potassium: 4.3 mmol/L (ref 3.5–5.1)
SODIUM: 135 mmol/L (ref 135–145)

## 2016-03-14 LAB — LIPID PANEL
CHOL/HDL RATIO: 4 ratio
Cholesterol: 104 mg/dL (ref 0–200)
HDL: 26 mg/dL — ABNORMAL LOW (ref >40)
LDL Cholesterol: 20 mg/dL (ref 0–99)
Triglycerides: 291 mg/dL — ABNORMAL HIGH (ref <150)
VLDL: 58 mg/dL — AB (ref 0–40)

## 2016-03-14 MED ORDER — METFORMIN HCL 500 MG PO TABS
500.0000 mg | ORAL_TABLET | Freq: Two times a day (BID) | ORAL | Status: DC
  Administered 2016-03-14 – 2016-03-15 (×2): 500 mg via ORAL
  Filled 2016-03-14 (×2): qty 1

## 2016-03-14 MED ORDER — GLIMEPIRIDE 4 MG PO TABS
4.0000 mg | ORAL_TABLET | Freq: Every day | ORAL | Status: DC
  Administered 2016-03-14 – 2016-03-19 (×6): 4 mg via ORAL
  Filled 2016-03-14 (×7): qty 1

## 2016-03-14 MED ORDER — HYDROMORPHONE HCL 1 MG/ML IJ SOLN
2.0000 mg | INTRAMUSCULAR | Status: AC | PRN
  Administered 2016-03-15 (×4): 2 mg via INTRAVENOUS
  Filled 2016-03-14 (×4): qty 2

## 2016-03-14 MED ORDER — CYCLOBENZAPRINE HCL 5 MG PO TABS
7.5000 mg | ORAL_TABLET | Freq: Three times a day (TID) | ORAL | Status: DC
  Administered 2016-03-14 – 2016-03-19 (×14): 7.5 mg via ORAL
  Filled 2016-03-14 (×14): qty 2

## 2016-03-14 NOTE — Progress Notes (Signed)
PT Cancellation Note  Patient Details Name: Jerome Shepherd MRN: [id number] DOB: 1965-10-30   Cancelled Treatment:    Reason Eval/Treat Not Completed: PT screened, no needs identified, will sign off.  Pt reports even with his worst pain he is independently mobilizing around the room without physical or external support or assist.  The pain he describes seems directly related to his UTI and not from lumbar pathology.  If his UTI clears and the MRI reveals lumbar issues, then he may benefit from OP PT f/u, but at this time I am encouraging him to ambulate with supervision while he is here in the acute hospital setting.  PT to sign off.   Thanks,    Barbarann Ehlers. [place], [place], DPT [phone]   03/14/2016, 5:46 PM

## 2016-03-14 NOTE — Progress Notes (Signed)
Inpatient Diabetes Program Recommendations  AACE/ADA: New Consensus Statement on Inpatient Glycemic Control (2015)  Target Ranges:  Prepandial:   less than 140 mg/dL      Peak postprandial:   less than 180 mg/dL (1-2 hours)      Critically ill patients:  140 - 180 mg/dL   Results for Jerome Shepherd, Jerome Shepherd (MRN [id number]) as of 03/14/2016 11:19  Ref. Range 03/13/2016 08:22 03/13/2016 12:05 03/13/2016 17:24 03/13/2016 22:16 03/14/2016 08:09  Glucose-Capillary Latest Ref Range: 65 - 99 mg/dL 155 (H) 174 (H) 178 (H) 207 (H) 237 (H)   Review of Glycemic Control  Diabetes history: DM2 Outpatient Diabetes medications: Januvia 100 mg daily, Metformin 500 mg AM, Amaryl 4 mg QAM Current orders for Inpatient glycemic control: Novolog 0-9 units TID with meals, Tradjenta 5 mg daily  Inpatient Diabetes Program Recommendations: Correction (SSI): Please consider increasing Novolog correction to Moderate and adding Novolog bedtime correction scale.  Thanks, Barnie Alderman, RN, MSN, CDE Diabetes Coordinator Inpatient Diabetes Program [phone] (Team Pager from [place] to [place]) [phone] ([place] office) [phone] [facility] office) [phone] [facility] office)

## 2016-03-14 NOTE — Progress Notes (Addendum)
Pharmacy Antibiotic Note  Jerome Shepherd is a 50 y.o. male admitted on 03/11/2016 with flank pain/ UTI.  Pharmacy has been consulted for imipenem/cilastatin dosing. Patient has recent urine culture (9/10) with ESBL E. Coli and sensitivity to imipenem/cilastatin.   Day #4 of abx for pyelonephritis (hx of ESBL e.coli) Re-admitted with left flank pain. MD states plan for few days of IV and then give a dose of fosfomycin and send home. May not be good option since fosfomycin does not concentrate well in upper urinary tract so most likely would not help with pyelo. If pain does not improve may get MRI of spine to rule out another reason for pain. Afebrile, WBC wnl  Plan: Continue imipenem/cilastatin 1g IV Q8 Monitor clinical picture, renal function F/U LOT  Temp (24hrs), Avg:98.1 F (36.7 C), Min:98 F (36.7 C), Max:98.2 F (36.8 C)   Recent Labs Lab 03/10/16 2256 03/11/16 1650 03/12/16 0403 03/13/16 0559 03/14/16 0509  WBC 7.8 10.8* 7.6 6.8 7.5  CREATININE 1.05 1.35* 1.05 1.09 0.99    Estimated Creatinine Clearance: 134.6 mL/min (by C-G formula based on SCr of 0.99 mg/dL).    Allergies  Allergen Reactions  . Codeine Nausea Only  . Sulfa Antibiotics Nausea Only    Antimicrobials this admission: 9/18 imipenem/cilastatin >>   Dose adjustments this admission: N/A  Microbiology results:  9/18 UCx: ngF 9/10 UCx:  ESBL e.coli    Thank you for allowing pharmacy to be a part of this patient's care.  Elenor Quinones, PharmD, BCPS Clinical Pharmacist Pager [phone] 03/14/2016 9:10 AM
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
## 2016-03-15 LAB — GLUCOSE, CAPILLARY
GLUCOSE-CAPILLARY: 174 mg/dL — AB (ref 65–99)
GLUCOSE-CAPILLARY: 124 mg/dL — AB (ref 65–99)
Glucose-Capillary: 180 mg/dL — ABNORMAL HIGH (ref 65–99)
Glucose-Capillary: 136 mg/dL — ABNORMAL HIGH (ref 65–99)

## 2016-03-15 LAB — HEMOGLOBIN A1C
Hgb A1c MFr Bld: 7.7 % — ABNORMAL HIGH (ref 4.8–5.6)
Mean Plasma Glucose: 174 mg/dL

## 2016-03-15 MED ORDER — FAMOTIDINE 20 MG PO TABS
20.0000 mg | ORAL_TABLET | Freq: Two times a day (BID) | ORAL | Status: DC | PRN
  Administered 2016-03-15 (×2): 20 mg via ORAL
  Filled 2016-03-15 (×2): qty 1

## 2016-03-15 MED ORDER — METOCLOPRAMIDE HCL 5 MG/ML IJ SOLN
5.0000 mg | Freq: Four times a day (QID) | INTRAMUSCULAR | Status: DC | PRN
  Administered 2016-03-15: 5 mg via INTRAVENOUS
  Filled 2016-03-15: qty 2

## 2016-03-15 MED ORDER — OXYCODONE HCL 5 MG PO TABS
5.0000 mg | ORAL_TABLET | ORAL | Status: DC | PRN
  Administered 2016-03-15 – 2016-03-16 (×2): 5 mg via ORAL
  Filled 2016-03-15 (×2): qty 1

## 2016-03-15 MED ORDER — KETOROLAC TROMETHAMINE 30 MG/ML IJ SOLN
30.0000 mg | Freq: Four times a day (QID) | INTRAMUSCULAR | Status: DC | PRN
  Administered 2016-03-15 – 2016-03-18 (×9): 30 mg via INTRAVENOUS
  Filled 2016-03-15 (×10): qty 1

## 2016-03-15 MED ORDER — HYDROMORPHONE HCL 1 MG/ML IJ SOLN
2.0000 mg | INTRAMUSCULAR | Status: AC | PRN
  Administered 2016-03-15 – 2016-03-16 (×4): 2 mg via INTRAVENOUS
  Filled 2016-03-15 (×4): qty 2

## 2016-03-15 MED ORDER — VALACYCLOVIR HCL 500 MG PO TABS
1000.0000 mg | ORAL_TABLET | Freq: Three times a day (TID) | ORAL | Status: DC
  Administered 2016-03-15 – 2016-03-19 (×11): 1000 mg via ORAL
  Filled 2016-03-15 (×11): qty 2

## 2016-03-15 MED ORDER — PROCHLORPERAZINE EDISYLATE 5 MG/ML IJ SOLN
5.0000 mg | Freq: Once | INTRAMUSCULAR | Status: AC
  Administered 2016-03-15: 5 mg via INTRAVENOUS
  Filled 2016-03-15: qty 2

## 2016-03-15 MED ORDER — GABAPENTIN 100 MG PO CAPS
100.0000 mg | ORAL_CAPSULE | Freq: Three times a day (TID) | ORAL | Status: DC
  Administered 2016-03-15 – 2016-03-16 (×2): 100 mg via ORAL
  Filled 2016-03-15 (×2): qty 1

## 2016-03-15 MED ORDER — SENNA 8.6 MG PO TABS
1.0000 | ORAL_TABLET | Freq: Every day | ORAL | Status: DC | PRN
  Administered 2016-03-15 – 2016-03-19 (×4): 8.6 mg via ORAL
  Filled 2016-03-15 (×5): qty 1

## 2016-03-15 MED ORDER — FAMOTIDINE 20 MG PO TABS
20.0000 mg | ORAL_TABLET | Freq: Every day | ORAL | Status: DC
  Administered 2016-03-16 – 2016-03-19 (×4): 20 mg via ORAL
  Filled 2016-03-15 (×4): qty 1
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
## 2016-03-15 NOTE — Progress Notes (Signed)
Event note:  Patient and nursing note new onset rash across left hip extending into left groin. Rash painful.  On examination: vesicular appearing rash in dermatomal pattern across left hip. Started on Valtrex 1000 mg tid and added Gabapentin for neuropathic pain.

## 2016-03-16 LAB — GLUCOSE, CAPILLARY
GLUCOSE-CAPILLARY: 218 mg/dL — AB (ref 65–99)
Glucose-Capillary: 168 mg/dL — ABNORMAL HIGH (ref 65–99)
Glucose-Capillary: 190 mg/dL — ABNORMAL HIGH (ref 65–99)
Glucose-Capillary: 207 mg/dL — ABNORMAL HIGH (ref 65–99)

## 2016-03-16 MED ORDER — POLYETHYLENE GLYCOL 3350 17 G PO PACK
17.0000 g | PACK | Freq: Every day | ORAL | Status: DC | PRN
  Administered 2016-03-16 – 2016-03-19 (×4): 17 g via ORAL
  Filled 2016-03-16 (×4): qty 1

## 2016-03-16 MED ORDER — GABAPENTIN 300 MG PO CAPS
300.0000 mg | ORAL_CAPSULE | Freq: Three times a day (TID) | ORAL | Status: DC
  Administered 2016-03-16 – 2016-03-17 (×3): 300 mg via ORAL
  Filled 2016-03-16 (×3): qty 1
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
## 2016-03-17 DIAGNOSIS — B029 Zoster without complications: Secondary | ICD-10-CM

## 2016-03-17 LAB — GLUCOSE, CAPILLARY
GLUCOSE-CAPILLARY: 137 mg/dL — AB (ref 65–99)
GLUCOSE-CAPILLARY: 205 mg/dL — AB (ref 65–99)
Glucose-Capillary: 175 mg/dL — ABNORMAL HIGH (ref 65–99)
Glucose-Capillary: 196 mg/dL — ABNORMAL HIGH (ref 65–99)

## 2016-03-17 MED ORDER — HYDROMORPHONE HCL 1 MG/ML IJ SOLN
1.0000 mg | Freq: Once | INTRAMUSCULAR | Status: AC
  Administered 2016-03-17: 1 mg via INTRAVENOUS
  Filled 2016-03-17: qty 1

## 2016-03-17 MED ORDER — HYDROMORPHONE HCL 1 MG/ML IJ SOLN
1.0000 mg | Freq: Four times a day (QID) | INTRAMUSCULAR | Status: DC | PRN
  Administered 2016-03-17 – 2016-03-19 (×6): 1 mg via INTRAVENOUS
  Filled 2016-03-17 (×7): qty 1

## 2016-03-17 MED ORDER — GABAPENTIN 300 MG PO CAPS
300.0000 mg | ORAL_CAPSULE | Freq: Four times a day (QID) | ORAL | Status: DC
  Administered 2016-03-17 (×3): 300 mg via ORAL
  Filled 2016-03-17 (×3): qty 1

## 2016-03-17 MED ORDER — OXYCODONE-ACETAMINOPHEN 5-325 MG PO TABS
1.0000 | ORAL_TABLET | ORAL | Status: DC | PRN
  Administered 2016-03-17 – 2016-03-18 (×5): 2 via ORAL
  Filled 2016-03-17 (×5): qty 2
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
## 2016-03-18 LAB — GLUCOSE, CAPILLARY
GLUCOSE-CAPILLARY: 167 mg/dL — AB (ref 65–99)
Glucose-Capillary: 190 mg/dL — ABNORMAL HIGH (ref 65–99)
Glucose-Capillary: 170 mg/dL — ABNORMAL HIGH (ref 65–99)
Glucose-Capillary: 196 mg/dL — ABNORMAL HIGH (ref 65–99)

## 2016-03-18 LAB — CBC
HEMATOCRIT: 39.8 % (ref 39.0–52.0)
Hemoglobin: 13.8 g/dL (ref 13.0–17.0)
MCH: 30.8 pg (ref 26.0–34.0)
MCHC: 34.7 g/dL (ref 30.0–36.0)
MCV: 88.8 fL (ref 78.0–100.0)
PLATELETS: 225 10*3/uL (ref 150–400)
RBC: 4.48 MIL/uL (ref 4.22–5.81)
RDW: 12.4 % (ref 11.5–15.5)
WBC: 6.2 10*3/uL (ref 4.0–10.5)

## 2016-03-18 LAB — BASIC METABOLIC PANEL
Anion gap: 8 (ref 5–15)
BUN: 13 mg/dL (ref 6–20)
CALCIUM: 8.8 mg/dL — AB (ref 8.9–10.3)
CO2: 25 mmol/L (ref 22–32)
CREATININE: 0.97 mg/dL (ref 0.61–1.24)
Chloride: 104 mmol/L (ref 101–111)
GFR calc non Af Amer: >60 mL/min (ref >60)
Glucose, Bld: 206 mg/dL — ABNORMAL HIGH (ref 65–99)
Potassium: 4.1 mmol/L (ref 3.5–5.1)
SODIUM: 137 mmol/L (ref 135–145)

## 2016-03-18 MED ORDER — GABAPENTIN 300 MG PO CAPS
600.0000 mg | ORAL_CAPSULE | Freq: Four times a day (QID) | ORAL | Status: DC
  Administered 2016-03-18 – 2016-03-19 (×5): 600 mg via ORAL
  Filled 2016-03-18 (×5): qty 2

## 2016-03-18 NOTE — Progress Notes (Signed)
PROGRESS NOTE    Jerome Shepherd  UUV:[id number] DOB: 1966-06-09 DOA: 03/11/2016 PCP: Glenda Chroman, MD  Brief Narrative: Jerome Shepherd is a 50 y.o. male with diabetes mellitus type 2, hypertension, hyperlipidemia presents to the ER because of worsening left flank pain. Patient had originally come to the ER on September 10 at [facility] and was diagnosed with UTI and was discharged home on oral antibiotics. Patient subsequently returned back again and had CT renal studies which was negative. Patient over the last 1 week has had 2 CT studies which were not showing anything acute. Patient reported taking antibiotics pain did improve but over the last 3 days pain started recurring again when he was trying to mow the yard. In ER, admitted due to persistent L flank pain and growing ESBL Ecoli from urine Cx  Assessment & Plan:  1. L Hip/flank pain due to SHingles  -still having severe pain -will incraese gabapentin to 635m QID, cotninue valtrex -MRI LS spine with some degenerative disc disease -ambulate, OOB  2. ESBL Ecoli UTI vs pyelonephritis -L flank pain for last week, had been on PO Abx and grew ESBL Ecoli -CT scan on 9/10 and 9/17 reviewed, no evidence of calculi -completed 5days of Primaxin now off Abx  2. Diabetes mellitus type 2 - -hold metformin and amaryl  -continue SSI  3. Acute renal failure  - probably from poor oral intake last 2-3 days and ARB -resolved with hydration  4. Hypertension  - continue amlodipine and Imdur  5. Hyperlipidemia on statins.  DVT prophylaxis: Lovenox.  Code Status: Full code.  Family Communication: Discussed with patient.  Disposition Plan: Home ? Tomorrow if symptomatically better  Antimicrobials:Primaxin 9/19   Subjective: Still having R flank pain, a little better  Objective: Vitals:   03/17/16 1427 03/17/16 2037 03/18/16 0450 03/18/16 0455  BP: 135/63 (!) 152/65  (!) 144/77  Pulse: 62 74  63  Resp: 18 18  18   Temp: 98.1  F (36.7 C) 98.4 F (36.9 C)  98 F (36.7 C)  TempSrc: Oral Oral  Oral  SpO2: 96% 96%  96%  Weight:   (!) 142 kg (313 lb 0.9 oz)   Height:        Intake/Output Summary (Last 24 hours) at 03/18/16 1055 Last data filed at 03/18/16 0530  Gross per 24 hour  Intake             1445 ml  Output                0 ml  Net             1445 ml   Filed Weights   03/14/16 0311 03/16/16 0600 03/18/16 0450  Weight: (!) 139.3 kg (307 lb 1.6 oz) (!) 140.3 kg (309 lb 4.9 oz) (!) 142 kg (313 lb 0.9 oz)    Examination:  General exam: Appears calm and comfortable, AAOx3, no distress Respiratory system: Clear to auscultation. Respiratory effort normal. Cardiovascular system: S1 & S2 heard, RRR. No JVD, murmurs, rubs, gallops or clicks. No pedal edema. Gastrointestinal system: Abdomen is nondistended, soft and nontender. R flank tenderness Central nervous system: Alert and oriented. No focal neurological deficits. Extremities: Symmetric 5 x 5 power. Skin: L hip/Buttock: extensive shingles rash with vesicles noted Psychiatry: Judgement and insight appear normal. Mood & affect appropriate.   Data Reviewed: I have personally reviewed following labs and imaging studies  CBC:  Recent Labs Lab 03/11/16 1650 03/12/16 0403  03/13/16 0559 03/14/16 0509 03/18/16 0803  WBC 10.8* 7.6 6.8 7.5 6.2  NEUTROABS  --  5.8  --   --   --   HGB 15.3 13.8 13.2 13.7 13.8  HCT 44.0 39.8 38.5* 39.8 39.8  MCV 88.9 88.8 88.9 88.4 88.8  PLT 284 228 243 246 245   Basic Metabolic Panel:  Recent Labs Lab 03/11/16 1650 03/12/16 0403 03/13/16 0559 03/14/16 0509 03/18/16 0803  NA 136 137 135 135 137  K 3.9 3.2* 4.1 4.3 4.1  CL 103 106 103 102 104  CO2 24 23 26 26 25   GLUCOSE 201* 137* 184* 242* 206*  BUN 16 12 9 10 13   CREATININE 1.35* 1.05 1.09 0.99 0.97  CALCIUM 9.1 8.5* 8.7* 8.7* 8.8*   GFR: Estimated Creatinine Clearance: 138.8 mL/min (by C-G formula based on SCr of 0.97 mg/dL). Liver Function  Tests:  Recent Labs Lab 03/11/16 1650 03/12/16 0403  AST 45* 30  ALT 26 19  ALKPHOS 61 49  BILITOT 0.6 0.8  PROT 6.6 6.1*  ALBUMIN 4.0 3.5    Recent Labs Lab 03/11/16 1650  LIPASE 38   No results for input(s): AMMONIA in the last 168 hours. Coagulation Profile: No results for input(s): INR, PROTIME in the last 168 hours. Cardiac Enzymes: No results for input(s): CKTOTAL, CKMB, CKMBINDEX, TROPONINI in the last 168 hours. BNP (last 3 results) No results for input(s): PROBNP in the last 8760 hours. HbA1C: No results for input(s): HGBA1C in the last 72 hours. CBG:  Recent Labs Lab 03/17/16 0807 03/17/16 1211 03/17/16 1639 03/17/16 2103 03/18/16 0816  GLUCAP 175* 137* 196* 205* 190*   Lipid Profile: No results for input(s): CHOL, HDL, LDLCALC, TRIG, CHOLHDL, LDLDIRECT in the last 72 hours. Thyroid Function Tests: No results for input(s): TSH, T4TOTAL, FREET4, T3FREE, THYROIDAB in the last 72 hours. Anemia Panel: No results for input(s): VITAMINB12, FOLATE, FERRITIN, TIBC, IRON, RETICCTPCT in the last 72 hours. Urine analysis:    Component Value Date/Time   COLORURINE YELLOW 03/11/2016 [place] 03/11/2016 1749   LABSPEC 1.028 03/11/2016 1749   PHURINE 5.5 03/11/2016 1749   GLUCOSEU 100 (A) 03/11/2016 1749   HGBUR NEGATIVE 03/11/2016 1749   BILIRUBINUR SMALL (A) 03/11/2016 1749   KETONESUR 15 (A) 03/11/2016 1749   PROTEINUR NEGATIVE 03/11/2016 1749   NITRITE NEGATIVE 03/11/2016 1749   LEUKOCYTESUR TRACE (A) 03/11/2016 1749   Sepsis Labs: @LABRCNTIP (procalcitonin:4,lacticidven:4)  ) Recent Results (from the past 240 hour(s))  Urine culture     Status: None   Collection Time: 03/11/16  5:49 PM  Result Value Ref Range Status   Specimen Description URINE, RANDOM  Final   Special Requests NONE  Final   Culture NO GROWTH  Final   Report Status 03/13/2016 FINAL  Final         Radiology Studies: No results found.      Scheduled  Meds: . amLODipine  10 mg Oral QHS  . aspirin EC  81 mg Oral Daily  . atorvastatin  20 mg Oral q1800  . cyclobenzaprine  7.5 mg Oral TID  . enoxaparin (LOVENOX) injection  70 mg Subcutaneous Q24H  . famotidine  20 mg Oral Daily  . gabapentin  600 mg Oral QID  . glimepiride  4 mg Oral Q breakfast  . insulin aspart  0-9 Units Subcutaneous TID WC  . isosorbide mononitrate  60 mg Oral QHS  . linagliptin  5 mg Oral Daily  . pantoprazole  40  mg Oral Daily  . valACYclovir  1,000 mg Oral TID   Continuous Infusions:     LOS: 6 days    Time spent: 53mn    PDomenic Polite MD [facility] Pager [phone] If 7PM-7AM, please contact night-coverage [email or web address] Password [email or web address]. [email or web address]9/25/2017, 10:55 AM

## 2016-03-19 LAB — CREATININE, SERUM
Creatinine, Ser: 1.03 mg/dL (ref 0.61–1.24)
GFR calc Af Amer: >60 mL/min (ref >60)

## 2016-03-19 LAB — GLUCOSE, CAPILLARY: Glucose-Capillary: 237 mg/dL — ABNORMAL HIGH (ref 65–99)

## 2016-03-19 MED ORDER — GABAPENTIN 300 MG PO CAPS: 600.0000 mg | ORAL_CAPSULE | Freq: Three times a day (TID) | ORAL | 0 refills | Status: DC

## 2016-03-19 MED ORDER — OXYCODONE-ACETAMINOPHEN 5-325 MG PO TABS: 1.0000 | ORAL_TABLET | Freq: Four times a day (QID) | ORAL | 0 refills | Status: DC | PRN

## 2016-03-19 MED ORDER — NAPROXEN 500 MG PO TABS
ORAL_TABLET | ORAL | 0 refills | Status: DC
Start: 2016-03-19 — End: 2016-07-25

## 2016-03-19 MED ORDER — VALACYCLOVIR HCL 1 G PO TABS: 1000.0000 mg | ORAL_TABLET | Freq: Three times a day (TID) | ORAL | 0 refills | Status: DC

## 2016-03-19 MED ORDER — POLYETHYLENE GLYCOL 3350 17 G PO PACK: 17.0000 g | PACK | Freq: Two times a day (BID) | ORAL | 0 refills | Status: DC

## 2016-03-19 MED ORDER — POLYETHYLENE GLYCOL 3350 17 G PO PACK: 17.0000 g | PACK | Freq: Two times a day (BID) | ORAL | Status: DC

## 2016-03-19 MED ORDER — MILK AND MOLASSES ENEMA
1.0000 | Freq: Once | RECTAL | Status: AC
  Administered 2016-03-19: 250 mL via RECTAL
  Filled 2016-03-19: qty 250

## 2016-03-19 MED ORDER — SENNA 8.6 MG PO TABS: 1.0000 | ORAL_TABLET | Freq: Every day | ORAL | 0 refills | Status: DC | PRN

## 2016-03-19 MED ORDER — BISACODYL 10 MG RE SUPP
10.0000 mg | Freq: Once | RECTAL | Status: AC
  Administered 2016-03-19: 10 mg via RECTAL
  Filled 2016-03-19: qty 1

## 2016-03-19 NOTE — Progress Notes (Signed)
Patient was discharged home by MD order; discharged instructions review and give to patient with care notes and prescriptions; IV DIC; patient will be escorted to the car by a volunteer via wheelchair.  

## 2016-03-27 NOTE — Discharge Summary (Signed)
Physician Discharge Summary  Jerome Shepherd [id number]:[id number] DOB: 12/20/1965 DOA: 03/11/2016  PCP: Glenda Chroman, MD  Admit date: 03/11/2016 Discharge date: 03/19/2016  Time spent: 45 minutes  Recommendations for Outpatient Follow-up:  1. PCP Dr.Vyas in 1week, please wean off Gabapentin and Percocet 2. Needs Urology FU for possible BPH  Discharge Diagnoses:  Principal Problem:   Herpes zoster Active Problems:   Diabetes mellitus type 2, controlled ([place])   HTN (hypertension)   Pyelonephritis   ARF (acute renal failure) ([facility])   ESBL (extended spectrum beta-lactamase) producing bacteria infection   Acute pyelonephritis   Chronic pain syndrome   Uncontrolled type 2 diabetes mellitus with complication ([place])   Acute renal failure ([place])   Discharge Condition: improved  Diet recommendation: diabetic  Filed Weights   03/16/16 0600 03/18/16 0450 03/19/16 0500  Weight: (!) 140.3 kg (309 lb 4.9 oz) (!) 142 kg (313 lb 0.9 oz) (!) 142.1 kg (313 lb 4.8 oz)    History of present illness:  Jerome Shepherd a 50 y.o.malewith diabetes mellitus type 2, hypertension, hyperlipidemia presents to the ER because of worsening left flank pain. Patient had originally come to the ER on September 10 at [facility] and was diagnosed with UTI and was discharged home on oral antibiotics. Patient subsequently returned back again and had CT renal studies which was negative. Patient over the last 1 week has had 2 CT studies which were not showing anything acute. Patient reported taking antibiotics pain did improve but over the last 3 days pain started recurring again when he was trying to [facility] yard. In ER, admitted due to persistent L flank pain   Hospital Course:  1. L Hip/flank pain due to SHingles  -initially felt to have Pyelonephritis and after day 3 of admission developed shingles rash, then started valtrex and gabapentin -due to persistent pain incraesed gabapentin to 6101m QID, cotninued  valtrex -MRI LS spine with some degenerative disc disease -ambulating in halls and with PT, also requiring narcotics which will need to be weaned off -discharged on high dose gabapentin this will need to be slowly tapered off  2. ESBL Ecoli UTI vs pyelonephritis -L flank pain for last week, had been on PO Abx prior to admission and urine culture grew ESBL Ecoli -CT scan on 9/10 and 9/17 reviewed, no evidence of calculi -completed 5days of Primaxin now off Abx -needs Urology FU for possible BPH  2. Diabetes mellitus type 2- -resumed metformin and amaryl   3. Acute renal failure - probably from poor oral intake last 2-3 days and ARB -resolved with hydration  4. Hypertension - continue amlodipine and Imdur  5. Hyperlipidemiaon statins.  Discharge Exam: Vitals:   03/18/16 2212 03/19/16 0636  BP: (!) 156/72 (!) 121/48  Pulse: 68 (!) 54  Resp: 17 16  Temp: 98.4 F (36.9 C) 98.6 F (37 C)    General: AAOx3 Cardiovascular: S1S2/RRR Respiratory: CTAB  Discharge Instructions   Discharge Instructions    Diet - low sodium heart healthy    Complete by:  As directed    Diet Carb Modified    Complete by:  As directed    Increase activity slowly    Complete by:  As directed      Discharge Medication List as of 03/19/2016 11:43 AM    START taking these medications   Details  gabapentin (NEURONTIN) 300 MG capsule Take 2 capsules (600 mg total) by mouth 3 (three) times daily. For 5days then cut down  to 38m 3times a day for 1 week, then cut down to 3022mat bedtime daily for 4days then STOP, Starting Tue 03/19/2016, Print    polyethylene glycol (MIRALAX / GLYCOLAX) packet Take 17 g by mouth 2 (two) times daily. For constipation, Starting Tue 03/19/2016, Print    senna (SENOKOT) 8.6 MG TABS tablet Take 1 tablet (8.6 mg total) by mouth daily as needed for mild constipation., Starting Tue 03/19/2016, Print    valACYclovir (VALTREX) 1000 MG tablet Take 1 tablet (1,000 mg  total) by mouth 3 (three) times daily. For 6days, Starting Tue 03/19/2016, Print      CONTINUE these medications which have CHANGED   Details  naproxen (NAPROSYN) 500 MG tablet Take 1 po BID with food prn pain for 5days, Print    oxyCODONE-acetaminophen (PERCOCET) 5-325 MG tablet Take 1-2 tablets by mouth every 6 (six) hours as needed (pain)., Starting Tue 03/19/2016, Print      CONTINUE these medications which have NOT CHANGED   Details  amLODipine (NORVASC) 10 MG tablet Take 1 tablet (10 mg total) by mouth daily., Starting 07/18/2015, Until Discontinued, Normal    aspirin EC 81 MG tablet Take 1 tablet (81 mg total) by mouth daily., Starting 11/23/2015, Until Discontinued, OTC    DiphenhydrAMINE HCl, Sleep, (SIMPLY SLEEP PO) Take 1-2 tablets by mouth at bedtime as needed (sleep)., Historical Med    glimepiride (AMARYL) 4 MG tablet Take 4 mg by mouth daily. , Starting Wed 12/20/2015, Historical Med    glyBURIDE micronized (GLYNASE) 6 MG tablet Take 6 mg by mouth daily with breakfast. Reported on 12/25/2015, Until Discontinued, Historical Med    isosorbide mononitrate (IMDUR) 60 MG 24 hr tablet Take 1 tablet (60 mg total) by mouth daily., Starting 07/18/2015, Until Discontinued, Normal    metFORMIN (GLUCOPHAGE) 500 MG tablet Take 500 mg by mouth daily with breakfast. , Until Discontinued, Historical Med    nitroGLYCERIN (NITROSTAT) 0.4 MG SL tablet Place 1 tablet (0.4 mg total) under the tongue every 5 (five) minutes as needed for chest pain., Starting 07/18/2015, Until Discontinued, Normal    ondansetron (ZOFRAN ODT) 4 MG disintegrating tablet [id number] q4 hours prn nausea/vomit, Print    pantoprazole (PROTONIX) 40 MG tablet Take 40 mg by mouth daily., Historical Med    ranitidine (ZANTAC) 150 MG tablet Take 300 mg by mouth 5 (five) times daily as needed for heartburn. , Historical Med    simvastatin (ZOCOR) 40 MG tablet Take 40 mg by mouth at bedtime. , Historical Med    sitaGLIPtin  (JANUVIA) 100 MG tablet Take 100 mg by mouth daily., Until Discontinued, Historical Med      STOP taking these medications     Azilsartan Medoxomil (EDARBI) 40 MG TABS      cephALEXin (KEFLEX) 500 MG capsule      cyclobenzaprine (FLEXERIL) 5 MG tablet      HYDROcodone-acetaminophen (NORCO/VICODIN) 5-325 MG tablet      ibuprofen (ADVIL,MOTRIN) 200 MG tablet      loperamide (IMODIUM A-D) 2 MG tablet      Magnesium Gluconate 27.5 MG TABS        Allergies  Allergen Reactions  . Codeine Nausea Only  . Sulfa Antibiotics Nausea Only   Follow-up Information    DhrGlenda ChromanD. Schedule an appointment as soon as possible for a visit on 03/26/2016.   Specialty:  Internal Medicine Why:  Appointment is on 03/26/16 at 12:30pm Contact information: [id number] [phone]  The results of significant diagnostics from this hospitalization (including imaging, microbiology, ancillary and laboratory) are listed below for reference.    Significant Diagnostic Studies: Mr Lumbar Spine Wo Contrast  Result Date: 03/14/2016 CLINICAL DATA:  Initial valuation for persistent left flank pain for several days. EXAM: MRI LUMBAR SPINE WITHOUT CONTRAST TECHNIQUE: Multiplanar, multisequence MR imaging of the lumbar spine was performed. No intravenous contrast was administered. COMPARISON:  None. FINDINGS: Segmentation: Normal segmentation. Lowest well-formed disc is labeled the L5-S1 level. Alignment: Trace 2 mm anterolisthesis of L4 on L5. Vertebral bodies otherwise normally aligned with preservation of the normal lumbar lordosis. Vertebrae: Vertebral body heights preserved. No evidence for acute or chronic fracture. Signal intensity within the vertebral body bone marrow is normal. No marrow edema. Conus medullaris: Extends to the L1 level and appears normal. Paraspinal and other soft tissues: Paraspinous soft tissues are normal. Visualized visceral structures unremarkable.  Disc levels: L1-2:  Negative. L2-3:  Negative. L3-4: No disc bulge or disc protrusion. Mild bilateral facet arthrosis. Mild bilateral foraminal narrowing. Central canal widely patent. L4-5: 2 mm anterolisthesis of L4 on L5. Associated diffuse disc bulge with disc desiccation. No focal disc protrusion. Moderate bilateral facet arthrosis with ligamentum flavum hypertrophy. Resultant moderate canal stenosis. Moderate bilateral foraminal narrowing, left greater than right. L5-S1: No disc bulge or disc protrusion. Mild bilateral facet arthrosis. No canal or foraminal stenosis. IMPRESSION: 1. 2 mm anterolisthesis of L4 on L5 with associated degenerative disc bulge and moderate bilateral facet arthrosis. Resultant moderate canal and bilateral foraminal narrowing, left greater than right. 2. Mild bilateral facet arthrosis at L3-4 and L5-S1. 3. No other significant degenerative changes or stenosis identified within the lumbar spine. Electronically Signed   By: Jeannine Boga M.D.   On: 03/14/2016 23:54   US Scrotum  Result Date: 03/11/2016 CLINICAL DATA:  Scrotal pain on the left for 1 week EXAM: SCROTAL ULTRASOUND DOPPLER ULTRASOUND OF THE TESTICLES TECHNIQUE: Complete ultrasound examination of the testicles, epididymis, and other scrotal structures was performed. Color and spectral Doppler ultrasound were also utilized to evaluate blood flow to the testicles. COMPARISON:  None. FINDINGS: Right testicle Measurements: 4.5 x 2.1 x 3.0 cm. No mass or microlithiasis visualized. Left testicle Measurements: 4.4 x 2.2 x 2.6 cm. No mass or microlithiasis visualized. Right epididymis: There is a cyst in the head of the epididymis measuring 4 x 5 x 4 mm. There is no inflammatory focus. Left epididymis:  Normal in size and appearance. Hydrocele:  There is a small hydrocele on each side. Varicocele: None visualized on the right. There is a small varicocele on the left. Pulsed Doppler interrogation of both testes demonstrates  normal low resistance arterial and venous waveforms bilaterally. Peak systolic velocity in each testis is approximately 5 cm/sec. A small calcification is noted along the left scrotal sac. There is no scrotal wall thickening or scrotal abscess. IMPRESSION: Small cyst in the head of the right epididymis. There is no intratesticular mass or torsion. No inflammatory foci identified on either side. There are small varicoceles bilaterally. There is a small varicocele on the left. A small focus of calcification on the left in the scrotal sac region likely represents residua of prior trauma. No acute appearing traumatic lesion evident. Electronically Signed   By: Lowella Grip III M.D.   On: 03/11/2016 14:11   [place] Art/ven Flow Abd Pelv Doppler  Result Date: 03/11/2016 CLINICAL DATA:  Scrotal pain on the left for 1 week EXAM: SCROTAL ULTRASOUND DOPPLER ULTRASOUND OF THE TESTICLES  TECHNIQUE: Complete ultrasound examination of the testicles, epididymis, and other scrotal structures was performed. Color and spectral Doppler ultrasound were also utilized to evaluate blood flow to the testicles. COMPARISON:  None. FINDINGS: Right testicle Measurements: 4.5 x 2.1 x 3.0 cm. No mass or microlithiasis visualized. Left testicle Measurements: 4.4 x 2.2 x 2.6 cm. No mass or microlithiasis visualized. Right epididymis: There is a cyst in the head of the epididymis measuring 4 x 5 x 4 mm. There is no inflammatory focus. Left epididymis:  Normal in size and appearance. Hydrocele:  There is a small hydrocele on each side. Varicocele: None visualized on the right. There is a small varicocele on the left. Pulsed Doppler interrogation of both testes demonstrates normal low resistance arterial and venous waveforms bilaterally. Peak systolic velocity in each testis is approximately 5 cm/sec. A small calcification is noted along the left scrotal sac. There is no scrotal wall thickening or scrotal abscess. IMPRESSION: Small cyst in the  head of the right epididymis. There is no intratesticular mass or torsion. No inflammatory foci identified on either side. There are small varicoceles bilaterally. There is a small varicocele on the left. A small focus of calcification on the left in the scrotal sac region likely represents residua of prior trauma. No acute appearing traumatic lesion evident. Electronically Signed   By: Lowella Grip III M.D.   On: 03/11/2016 14:11   Ct Renal Stone Study  Result Date: 03/11/2016 CLINICAL DATA:  Gradual onset left flank pain. Pain radiates to the left groin and left testis. EXAM: CT ABDOMEN AND PELVIS WITHOUT CONTRAST TECHNIQUE: Multidetector CT imaging of the abdomen and pelvis was performed following the standard protocol without IV contrast. COMPARISON:  03/03/2016 FINDINGS: Lower chest: No acute abnormality.  Coronary artery calcifications. Hepatobiliary: Diffuse fatty infiltration of the liver. Surgical absence of the gallbladder. No bile duct dilatation. Pancreas: Unenhanced appearance of the pancreas is unremarkable. Spleen: Unenhanced appearance of the spleen is unremarkable. Adrenals/Urinary Tract: No adrenal gland nodules. Kidneys appear symmetrical in size and shape. No hydronephrosis or hydroureter. No renal, ureteral, or bladder stones. No bladder wall thickening. Stomach/Bowel: Unenhanced appearance is unremarkable. Stool-filled colon without distention. Appendix is normal. Vascular/Lymphatic: Aortic atherosclerosis. No enlarged abdominal or pelvic lymph nodes. Reproductive: Prostate gland is not enlarged. Other: Small left periumbilical hernia containing fat. No inguinal hernias in Musculoskeletal: Mild degenerative changes in the spine. No destructive bone lesions. IMPRESSION: No renal or ureteral stone or obstruction. Diffuse fatty infiltration of the liver. Small periumbilical hernia containing fat. Electronically Signed   By: Lucienne Capers M.D.   On: 03/11/2016 00:59   Ct Renal Stone  Study  Result Date: 03/03/2016 CLINICAL DATA:  Initial evaluation for lower back pain radiating to glands normal urinary frequency. EXAM: CT ABDOMEN AND PELVIS WITHOUT CONTRAST TECHNIQUE: Multidetector CT imaging of the abdomen and pelvis was performed following the standard protocol without IV contrast. COMPARISON:  None available. FINDINGS: Lower chest: Visualized lung bases are clear. Coronary artery calcifications noted. Hepatobiliary: Diffuse hypoattenuation liver consistent with steatosis. Liver otherwise unremarkable. Gallbladder surgically absent. No biliary dilatation. Pancreas: Pancreas within normal limits. Spleen: Spleen unremarkable. Adrenals/Urinary Tract: Adrenal glands within normal limits. Kidneys equal in size without evidence nephrolithiasis or hydronephrosis. No radiopaque calculi seen along the course of either renal collecting system. There is no hydroureter. Bladder partially distended without acute abnormality. No layering stones within the bladder lumen. Stomach/Bowel: Stomach within normal limits. No evidence for bowel obstruction. Appendix within normal limits. No abnormal wall thickening  or inflammatory fat stranding seen about the bowels. 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 No aneurysm. No adenopathy. Reproductive: Prostate within normal limits. Other: No free air or fluid. Musculoskeletal: External soft tissues demonstrate no acute abnormality. No acute osseous abnormality. No worrisome lytic or blastic osseous lesions. IMPRESSION: 1. No CT evidence for nephrolithiasis or obstructive uropathy. 2. No other acute intra-abdominal or pelvic process. 3. Status post cholecystectomy. 4. Hepatic steatosis. 5. 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 Electronically Signed   By: Jeannine Boga M.D.   On: 03/03/2016 21:09    Microbiology: No results found for this or any previous visit (from the past 240  hour(s)).   Labs: Basic Metabolic Panel: No results for input(s): NA, K, CL, CO2, GLUCOSE, BUN, CREATININE, CALCIUM, MG, PHOS in the last 168 hours. Liver Function Tests: No results for input(s): AST, ALT, ALKPHOS, BILITOT, PROT, ALBUMIN in the last 168 hours. No results for input(s): LIPASE, AMYLASE in the last 168 hours. No results for input(s): AMMONIA in the last 168 hours. CBC: No results for input(s): WBC, NEUTROABS, HGB, HCT, MCV, PLT in the last 168 hours. Cardiac Enzymes: No results for input(s): CKTOTAL, CKMB, CKMBINDEX, TROPONINI in the last 168 hours. BNP: BNP (last 3 results) No results for input(s): BNP in the last 8760 hours.  ProBNP (last 3 results) No results for input(s): PROBNP in the last 8760 hours.  CBG: No results for input(s): GLUCAP in the last 168 hours.     SignedDomenic Polite MD.  [facility] 03/27/2016, 3:53 PM

## 2016-03-29 ENCOUNTER — Ambulatory Visit: Payer: No Typology Code available for payment source | Admitting: Gastroenterology

## 2016-04-05 ENCOUNTER — Other Ambulatory Visit: Payer: Self-pay | Admitting: Cardiovascular Disease

## 2016-04-14 ENCOUNTER — Other Ambulatory Visit: Payer: Self-pay | Admitting: Cardiovascular Disease

## 2016-04-24 ENCOUNTER — Encounter: Payer: Self-pay | Admitting: Gastroenterology

## 2016-04-24 ENCOUNTER — Ambulatory Visit (INDEPENDENT_AMBULATORY_CARE_PROVIDER_SITE_OTHER): Payer: No Typology Code available for payment source | Admitting: Gastroenterology

## 2016-04-24 VITALS — BP 160/86 | HR 87 | Temp 98.2°F | Ht 74.0 in | Wt 311.9 lb

## 2016-04-24 DIAGNOSIS — Z1211 Encounter for screening for malignant neoplasm of colon: Secondary | ICD-10-CM | POA: Insufficient documentation

## 2016-04-24 DIAGNOSIS — K219 Gastro-esophageal reflux disease without esophagitis: Secondary | ICD-10-CM

## 2016-04-24 DIAGNOSIS — K7581 Nonalcoholic steatohepatitis (NASH): Secondary | ICD-10-CM

## 2016-04-24 NOTE — Patient Instructions (Signed)
1. Continue pantoprazole once daily before breakfast. Call if recurrent issues with acid reflux or swallowing concerns worsen. 2. We will bring you back in three months for follow up and to schedule you for first ever colonoscopy at that time.  Instructions for fatty liver: Recommend 1-2# weight loss per week until ideal body weight through exercise & diet. Low fat/cholesterol diet.   Avoid sweets, sodas, fruit juices, sweetened beverages like tea, etc. Gradually increase exercise from 15 min daily up to 1 hr per day 5 days/week. Limit alcohol use.

## 2016-04-24 NOTE — Assessment & Plan Note (Signed)
No family history of colon cancer. Patient has never had a colonoscopy. No lower GI symptoms. Consider colonoscopy in the near future once he is fully recovered from shingles. Patient will consider pursuing colonoscopy after his next office visit in 3 months.

## 2016-04-24 NOTE — Assessment & Plan Note (Signed)
Doing much better at this time. On pantoprazole 40 mg daily. Discussed antireflux measures both dietary and behavioral modifications. If he has refractory symptoms, would increase pantoprazole to twice a day rather than add high-dose Zantac. Return to the office in 3 months or call sooner if needed.  As mentioned above, patient has some vague swallowing concerns related to liquids only. Overall swallowing improved. Could consider barium pill esophagram if ongoing or worsening symptoms to rule out other etiologies such as Zenker's diverticulum.

## 2016-04-24 NOTE — Progress Notes (Signed)
Primary Care Physician: Glenda Chroman, MD  Primary Gastroenterologist:  Garfield Cornea, MD   Chief Complaint  Patient presents with  . Follow-up    HPI: Jerome Shepherd is a 50 y.o. male here For follow-up. He was seen by Dr. Gala Romney back in August for worsening GERD, dysphagia. He also has a history of Jerome Shepherd with grade 1 fibrosis on liver biopsy in 2015. EGD August 2017 with normal esophagus status post. Dilation. Small hiatal hernia. Otherwise unremarkable. Patient reports swallowing somewhat better. Continues to feel as liquids stop in the upper esophagus at times. Almost like a air pocket blocks a liquids from passing. He denies using carbonated beverages. He drinks out of a straw at restaurants but not at home. No odynophagia. Denies any abdominal pain. Bowel function regular. No blood in stool or melena. Bad breath within 10-15 minutes after brushing his teeth. Discussed possibility of BPE to further evaluate for possible Zenker's diverticulum but patient declines at this time. He'll let us know if his symptoms worsen. He continues to take Protonix daily. Tried Dexilant samples but did not stay on chronically, unclear why, patient doesn't recall if it was insurance reason. At any rate his reflux is well-controlled at this time. Prior to several weeks ago he was taking 6-8 Zantac daily in addition to 1 protonix daily but he doesn't have to do that anymore.  Patient recently hospitalized for 9 days. He presented with worsening left flank pain. Had been seen in the [place] couple weeks prior with UTI. Symptoms recurred therefore he came back to the ED. Was treated for possible pyelonephritis but after 3 days of admission he developed a rash consistent with shingles. Started on high-dose gabapentin and Valtrex. Hemoglobin A1c 7.29 February 2016.   Current Outpatient Prescriptions  Medication Sig Dispense Refill  . amLODipine (NORVASC) 10 MG tablet TAKE 1 TABLET BY MOUTH EVERY DAY 30  tablet 6  . aspirin EC 81 MG tablet Take 1 tablet (81 mg total) by mouth daily.    . DiphenhydrAMINE HCl, Sleep, (SIMPLY SLEEP PO) Take 1-2 tablets by mouth at bedtime as needed (sleep).    . EDARBI 40 MG TABS TAKE ONE TABLET BY MOUTH DAILY. 30 tablet 9  . gabapentin (NEURONTIN) 300 MG capsule Take 2 capsules (600 mg total) by mouth 3 (three) times daily. For 5days then cut down to 300mg  3times a day for 1 week, then cut down to 300mg  at bedtime daily for 4days then STOP 60 capsule 0  . glimepiride (AMARYL) 4 MG tablet Take 4 mg by mouth daily.   3  . glyBURIDE micronized (GLYNASE) 6 MG tablet Take 6 mg by mouth daily with breakfast. Reported on 12/25/2015    . isosorbide mononitrate (IMDUR) 60 MG 24 hr tablet TAKE 1 TABLET BY MOUTH EVERY DAY 30 tablet 6  . metFORMIN (GLUCOPHAGE) 500 MG tablet Take 500 mg by mouth daily with breakfast.     . naproxen (NAPROSYN) 500 MG tablet Take 1 po BID with food prn pain for 5days 10 tablet 0  . nitroGLYCERIN (NITROSTAT) 0.4 MG SL tablet Place 1 tablet (0.4 mg total) under the tongue every 5 (five) minutes as needed for chest pain. 25 tablet 3  . ondansetron (ZOFRAN ODT) 4 MG disintegrating tablet 4mg  ODT q4 hours prn nausea/vomit (Patient taking differently: Take 4 mg by mouth every 4 (four) hours as needed for nausea or vomiting. 4mg  ODT q4 hours prn nausea/vomit) 12 tablet 0  .  pantoprazole (PROTONIX) 40 MG tablet Take 40 mg by mouth daily.    . polyethylene glycol (MIRALAX / GLYCOLAX) packet Take 17 g by mouth 2 (two) times daily. For constipation 20 each 0  . ranitidine (ZANTAC) 150 MG tablet Take 300 mg by mouth 5 (five) times daily as needed for heartburn.     . senna (SENOKOT) 8.6 MG TABS tablet Take 1 tablet (8.6 mg total) by mouth daily as needed for mild constipation. 30 each 0  . simvastatin (ZOCOR) 40 MG tablet Take 40 mg by mouth at bedtime.     . sitaGLIPtin (JANUVIA) 100 MG tablet Take 100 mg by mouth daily.    . valACYclovir (VALTREX) 1000 MG  tablet Take 1 tablet (1,000 mg total) by mouth 3 (three) times daily. For 6days 18 tablet 0  . oxyCODONE-acetaminophen (PERCOCET) 5-325 MG tablet Take 1-2 tablets by mouth every 6 (six) hours as needed (pain). (Patient not taking: Reported on 04/24/2016) 30 tablet 0   No current facility-administered medications for this visit.     Allergies as of 04/24/2016 - Review Complete 04/24/2016  Allergen Reaction Noted  . Codeine Nausea Only 08/18/2011  . Sulfa antibiotics Nausea Only 08/18/2011    ROS:  General: Negative for anorexia, weight loss, fever, chills, fatigue, weakness. ENT: Negative for hoarseness, difficulty swallowing , nasal congestion.See history of present illness CV: Negative for chest pain, angina, palpitations, dyspnea on exertion, peripheral edema.  Respiratory: Negative for dyspnea at rest, dyspnea on exertion, cough, sputum, wheezing.  GI: See history of present illness. GU:  Negative for dysuria, hematuria, urinary incontinence, urinary frequency, nocturnal urination.  Endo: Negative for unusual weight change.    Physical Examination:   BP (!) 160/86   Pulse 87   Temp 98.2 F (36.8 C) (Oral)   Ht 6\' 2"  (1.88 m)   Wt (!) 311 lb 14.4 oz (141.5 kg)   BMI 40.05 kg/m   General: Well-nourished, well-developed in no acute distress.  Eyes: No icterus. Mouth: Oropharyngeal mucosa moist and pink , no lesions erythema or exudate. Lungs: Clear to auscultation bilaterally.  Heart: Regular rate and rhythm, no murmurs rubs or gallops.  Abdomen: Bowel sounds are normal, nontender, nondistended, no hepatosplenomegaly or masses, no abdominal bruits or hernia , no rebound or guarding.  Well healing rash along the left lower flank extending into the left lower quadrant. Extremities: No lower extremity edema. No clubbing or deformities. Neuro: Alert and oriented x 4   Skin: Warm and dry, no jaundice.   Psych: Alert and cooperative, normal mood and affect.  Labs:  Lab Results    Component Value Date   CREATININE 1.03 03/19/2016   BUN 13 03/18/2016   NA 137 03/18/2016   K 4.1 03/18/2016   CL 104 03/18/2016   CO2 25 03/18/2016   Lab Results  Component Value Date   ALT 19 03/12/2016   AST 30 03/12/2016   ALKPHOS 49 03/12/2016   BILITOT 0.8 03/12/2016   Lab Results  Component Value Date   WBC 6.2 03/18/2016   HGB 13.8 03/18/2016   HCT 39.8 03/18/2016   MCV 88.8 03/18/2016   PLT 225 03/18/2016    Imaging Studies: No results found.

## 2016-04-24 NOTE — Assessment & Plan Note (Signed)
Liver biopsy in 2015 showed Nash with grade 1 fibrosis. lfts normal.   Instructions for fatty liver: Recommend 1-2# weight loss per week until ideal body weight through exercise & diet. Low fat/cholesterol diet.   Avoid sweets, sodas, fruit juices, sweetened beverages like tea, etc. Gradually increase exercise from 15 min daily up to 1 hr per day 5 days/week. Limit alcohol use.  Return to the office in 3 months. Would recommend checking LFTs twice yearly. Consider updating abdominal ultrasound within the next year to look for any progression of the disease.

## 2016-04-25 NOTE — Progress Notes (Signed)
cc'ed to pcp °

## 2016-05-02 ENCOUNTER — Other Ambulatory Visit: Payer: Self-pay | Admitting: Cardiovascular Disease

## 2016-07-25 ENCOUNTER — Encounter: Payer: Self-pay | Admitting: Gastroenterology

## 2016-07-25 ENCOUNTER — Other Ambulatory Visit: Payer: Self-pay

## 2016-07-25 ENCOUNTER — Ambulatory Visit (INDEPENDENT_AMBULATORY_CARE_PROVIDER_SITE_OTHER): Payer: No Typology Code available for payment source | Admitting: Gastroenterology

## 2016-07-25 VITALS — BP 145/84 | HR 79 | Temp 98.5°F | Ht 76.0 in | Wt 320.0 lb

## 2016-07-25 DIAGNOSIS — K7581 Nonalcoholic steatohepatitis (NASH): Secondary | ICD-10-CM | POA: Diagnosis not present

## 2016-07-25 DIAGNOSIS — K219 Gastro-esophageal reflux disease without esophagitis: Secondary | ICD-10-CM

## 2016-07-25 DIAGNOSIS — Z1211 Encounter for screening for malignant neoplasm of colon: Secondary | ICD-10-CM

## 2016-07-25 MED ORDER — PEG-KCL-NACL-NASULF-NA ASC-C 100 G PO SOLR: 1.0000 | ORAL | 0 refills | Status: DC

## 2016-07-25 MED ORDER — DEXLANSOPRAZOLE 60 MG PO CPDR: 60.0000 mg | DELAYED_RELEASE_CAPSULE | Freq: Every day | ORAL | 5 refills | Status: DC

## 2016-07-25 NOTE — Assessment & Plan Note (Signed)
Complain of refractory heartburn with discomfort radiating throughout his chest. He questions whether he should have his hiatal hernia repair. His hiatal hernia was small on recent EGD therefore would not advise for surgical intervention at this point. Discussed need for anti-reflex measures including dietary and behavioral. Tends to eat before he lays down. Importance of losing weight. We will switch him from pantoprazole to Dexilant. Advised him to cut back on Zantac and likely need for drug holiday due to tachycardia for lax as that can develop on Zantac. He will call with progress report in a few weeks. Return to the office in 4 months to see Dr. Gala Romney.

## 2016-07-25 NOTE — Assessment & Plan Note (Signed)
Plan for first ever screening colonoscopy in the near future. Deep sedation in the OR as before.  I have discussed the risks, alternatives, benefits with regards to but not limited to the risk of reaction to medication, bleeding, infection, perforation and the patient is agreeable to proceed. Written consent to be obtained.

## 2016-07-25 NOTE — Assessment & Plan Note (Signed)
Liver biopsy in 2015 at time of cholecystectomy showed Jerome Shepherd with grade 1 fibrosis. LFTs normal. Continue to reiterate need for weight loss, increase physical activity as he is at risk of progression to cirrhosis. Liver imaging via noncontrast CT September 2017 with fatty liver. No evidence of cirrhosis. Continue to follow. Return to the office in 4 months to see Dr. Gala Romney.

## 2016-07-25 NOTE — Progress Notes (Signed)
Primary Care Physician: Glenda Chroman, MD  Primary Gastroenterologist:  Garfield Cornea, MD   Chief Complaint  Patient presents with  . Gastroesophageal Reflux  . Abdominal Pain    epigastric. Wants to discuss hiatal hernia    HPI: Jerome Shepherd is a 51 y.o. male here for follow-up. He has a history of [name] with grade 1 fibrosis on liver biopsy 2015. EGD August 2017 with normal esophagus status post empiric dilation, small hiatal hernia. When I last saw him in November his swallowing was somewhat improved. Was having difficulty with liquids "stopping" in the upper esophagus like if a air pocket blocked passage. Complained of bad breath. Offered BPE to evaluate for possible Zenker's but he declined. Patient hospitalized last fall with? Pyelonephritis but 3 days after admission he developed shingles as an etiology for his left flank pain.CT abdomen pelvis without contrast at that time showed diffuse fatty liver, small left periumbilical hernia containing fat.  Patient presents today to discuss having first ever screening colonoscopy. Also the most discuss ongoing upper GI symptoms. Doesn't happen daily but some days he gets epigastric pain that radiates up into the center of his chest and into both shoulders. He did see his cardiologist when this first started back last summer. Symptoms in the setting of stress related to death of his mother and of his sister age 56 of sudden MI. No suspected cardiac issues based on previous workup, suspected acute grief reaction.  Patient states that he gets typical heartburn as well, takes pantoprazole 40 mg daily but may take additional 6-8 over-the-counter Zantac every day. Sometimes he is able to eat something to relieve his heartburn and he goes way for the rest the day. Sometimes he has to take multiple Zantac. When his heartburn is flaring, he has loose explosive stools as well. When he does not have any upper GI symptoms/heartburn, his stools are  solid. Usually occurs 1-2 times per week with loose stools. No melena rectal bleeding. Rarely constipated. He wonders if he should have his hiatal hernia repair.  He neurologist studies gained 10 pounds since we last saw him. He also notes that his diabetes is poorly controlled and that he is not eating right or exercising. 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   Current Outpatient Prescriptions  Medication Sig Dispense Refill  . amLODipine (NORVASC) 10 MG tablet TAKE 1 TABLET BY MOUTH EVERY DAY 30 tablet 6  . aspirin EC 81 MG tablet Take 1 tablet (81 mg total) by mouth daily.    . DiphenhydrAMINE HCl, Sleep, (SIMPLY SLEEP PO) Take 1-2 tablets by mouth at bedtime as needed (sleep).    . EDARBI 40 MG TABS TAKE ONE TABLET BY MOUTH DAILY. 30 tablet 9  . glimepiride (AMARYL) 4 MG tablet Take 4 mg by mouth daily.   3  . glyBURIDE micronized (GLYNASE) 6 MG tablet Take 6 mg by mouth daily with breakfast. Reported on 12/25/2015    . isosorbide mononitrate (IMDUR) 60 MG 24 hr tablet TAKE 1 TABLET BY MOUTH EVERY DAY 30 tablet 6  . metFORMIN (GLUCOPHAGE) 500 MG tablet Take 500 mg by mouth daily with breakfast.     . nitroGLYCERIN (NITROSTAT) 0.4 MG SL tablet Place 1 tablet (0.4 mg total) under the tongue every 5 (five) minutes as needed for chest pain. 25 tablet 3  . pantoprazole (PROTONIX) 40 MG tablet Take 40 mg by mouth daily.    Marland Kitchen  ranitidine (ZANTAC) 150 MG tablet Take 300 mg by mouth 5 (five) times daily as needed for heartburn.     . simvastatin (ZOCOR) 40 MG tablet Take 40 mg by mouth at bedtime.     . simvastatin (ZOCOR) 40 MG tablet TAKE 1 TABLET BY MOUTH EVERY EVENING 30 tablet 6  . sitaGLIPtin (JANUVIA) 100 MG tablet Take 100 mg by mouth daily.     No current facility-administered medications for this visit.     Allergies as of 07/25/2016 - Review Complete 07/25/2016  Allergen Reaction Noted  . Codeine Nausea Only 08/18/2011  . Sulfa antibiotics Nausea Only  08/18/2011   Past Medical History:  Diagnosis Date  . 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 CAD (coronary artery disease), minimal, non obstructive 2013 01/01/2013  . Constipation   . Coronary artery spasm ([place]) 2013  . Diabetes mellitus   . GERD (gastroesophageal reflux disease)   . Hyperlipidemia   . Hypertension   . Myocardial infarction    2012   Past Surgical History:  Procedure Laterality Date  . CARDIAC CATHETERIZATION  2013   non obstructive CAD  ~20%  . CHOLECYSTECTOMY N/A 04/01/2014   Procedure: LAPAROSCOPIC CHOLECYSTECTOMY;  Surgeon: Jamesetta So, MD;  Location: [facility];  Service: General;  Laterality: N/A;  . ESOPHAGOGASTRODUODENOSCOPY N/A 01/10/2014   Dr. Gala Romney: antral erosions, path with chronic inactive gastritis. Empiric dilation with 56-F dilation  . ESOPHAGOGASTRODUODENOSCOPY (EGD) WITH PROPOFOL N/A 02/16/2016   Dr. Gala Romney: small hiatal hernia, normal esopagus s/p empiric dilation  . KNEE ARTHROSCOPY Right 2002  . LEFT HEART CATH N/A 08/18/2011   Procedure: LEFT HEART CATH;  Surgeon: Lorretta Harp, MD;  Location: [facility] CATH LAB;  Service: Cardiovascular;  Laterality: N/A;  . LIVER BIOPSY N/A 04/01/2014   mild fibrosis  . MALONEY DILATION N/A 01/10/2014   Procedure: Venia Minks DILATION;  Surgeon: Daneil Dolin, MD;  Location: [facility] SUITE;  Service: Endoscopy;  Laterality: N/A;  Venia Minks DILATION N/A 02/16/2016   Procedure: Venia Minks DILATION;  Surgeon: Daneil Dolin, MD;  Location: [facility];  Service: Endoscopy;  Laterality: N/A;  . SAVORY DILATION N/A 01/10/2014   Procedure: SAVORY DILATION;  Surgeon: Daneil Dolin, MD;  Location: [facility] SU[facility];  Service: Endoscopy;  Laterality: N/A;   Family History  Problem Relation Age of Onset  . Diabetes type II Sister   . Heart attack Cousin   . Colon cancer Neg Hx   . Stomach cancer Neg Hx    Social History   Social History  . Marital status: Single    Spouse name:  N/A  . Number of children: N/A  . Years of education: N/A   Occupational History  . truck driver Product manager   Social History Main Topics  . Smoking status: Former Smoker    Quit date: 08/18/1995  . Smokeless tobacco: Never Used     Comment: Quit smoking x 20 years  . Alcohol use No  . Drug use: No  . Sexual activity: Not Currently   Other Topics Concern  . None   Social History Narrative   Divorced, no children, works as a Production designer, theatre/television/film.    ROS:  General: Negative for anorexia, weight loss, fever, chills, fatigue, weakness. ENT: Negative for hoarseness, difficulty swallowing , nasal congestion. CV: Negative for chest pain, angina, palpitations, dyspnea on exertion, peripheral edema.  Respiratory: Negative for dyspnea at rest,  dyspnea on exertion, cough, sputum, wheezing.  GI: See history of present illness. GU:  Negative for dysuria, hematuria, urinary incontinence, urinary frequency, nocturnal urination.  Endo: Negative for unusual weight change.    Physical Examination:   BP (!) 145/84   Pulse 79   Temp 98.5 F (36.9 C) (Oral)   Ht 6\' 4"  (1.93 m)   Wt (!) 320 lb (145.2 kg)   BMI 38.95 kg/m   General: Well-nourished, well-developed in no acute distress.   Eyes: No icterus. Mouth: Oropharyngeal mucosa moist and pink , no lesions erythema or exudate. Lungs: Clear to auscultation bilaterally.  Heart: Regular rate and rhythm, no murmurs rubs or gallops.  Abdomen: Bowel sounds are normal,  obese, nontender, no hepatosplenomegaly or masses, no abdominal bruits. I did not appreciate periumbilical hernia.    Extremities: No lower extremity edema. No clubbing or deformities. Neuro: Alert and oriented x 4   Skin: Warm and dry, no jaundice.   Psych: Alert and cooperative, normal mood and affect.  Labs:  Lab Results  Component Value Date   ALT 19 03/12/2016   AST 30 03/12/2016   ALKPHOS 49 03/12/2016   BILITOT 0.8 03/12/2016   Lab Results  Component  Value Date   CREATININE 1.03 03/19/2016   BUN 13 03/18/2016   NA 137 03/18/2016   K 4.1 03/18/2016   CL 104 03/18/2016   CO2 25 03/18/2016   Lab Results  Component Value Date   WBC 6.2 03/18/2016   HGB 13.8 03/18/2016   HCT 39.8 03/18/2016   MCV 88.8 03/18/2016   PLT 225 03/18/2016     Imaging Studies: No results found.

## 2016-07-25 NOTE — Patient Instructions (Addendum)
1. Stop pantoprazole.  2. Start Dexilant once daily on empty stomach. Samples and RX provided.  3. Call in 2 weeks and let me know how your reflux/chest discomfort is.  4. Colonoscopy as scheduled. See separate instructions.  5. Return to the office to see Dr. Gala Romney in four months.

## 2016-07-26 ENCOUNTER — Telehealth: Payer: Self-pay

## 2016-07-26 MED ORDER — ESOMEPRAZOLE MAGNESIUM 40 MG PO CPDR: 40.0000 mg | DELAYED_RELEASE_CAPSULE | Freq: Two times a day (BID) | ORAL | 3 refills | Status: DC

## 2016-07-26 NOTE — Telephone Encounter (Signed)
Pt called to inform us that he has tried the [place] before and he could not take it. Please advise

## 2016-07-26 NOTE — Addendum Note (Signed)
Addended by: Mahala Menghini on: 07/26/2016 12:06 PM   Modules accepted: Orders

## 2016-07-26 NOTE — Telephone Encounter (Signed)
rx for nexium 40mg  bid sent.

## 2016-07-29 NOTE — Progress Notes (Signed)
CC'ED TO PCP 

## 2016-07-30 NOTE — Telephone Encounter (Signed)
Tried to call with no answer  

## 2016-07-30 NOTE — Telephone Encounter (Signed)
Pt is aware.  

## 2016-08-16 NOTE — Patient Instructions (Addendum)
Jerome Shepherd  08/16/2016     @PREFPERIOPPHARMACY @   Your procedure is scheduled on 08/22/2016.  Report to [facility] at 6:15 A.M.  Call this number if you have problems the morning of surgery:  [phone]   Remember:  Do not eat food or drink liquids after midnight.  Take these medicines the morning of surgery with A SIP OF WATER : Norvasc, Nexium, Imdur, Zantac and Edarbi   Do not wear jewelry, make-up or nail polish.  Do not wear lotions, powders, or perfumes, or deoderant.  Do not shave 48 hours prior to surgery.  Men may shave face and neck.  Do not bring valuables to the hospital.  [facility] is not responsible for any belongings or valuables.  Contacts, dentures or bridgework may not be worn into surgery.  Leave your suitcase in the car.  After surgery it may be brought to your room.  For patients admitted to the hospital, discharge time will be determined by your treatment team.  Patients discharged the day of surgery will not be allowed to drive home.   Name and phone number of your driver:   family Special instructions:  Please read over the following fact sheets that you were given. Care and Recovery After Surgery     Colonoscopy, Adult A colonoscopy is an exam to look at the entire large intestine. During the exam, a lubricated, bendable tube is inserted into the anus and then passed into the rectum, colon, and other parts of the large intestine. A colonoscopy is often done as a part of normal colorectal screening or in response to certain symptoms, such as anemia, persistent diarrhea, abdominal pain, and blood in the stool. The exam can help screen for and diagnose medical problems, including:  Tumors.  Polyps.  Inflammation.  Areas of bleeding. Tell a health care provider about:  Any allergies you have.  All medicines you are taking, including vitamins, herbs, eye drops, creams, and over-the-counter medicines.  Any problems you or family members  have had with anesthetic medicines.  Any blood disorders you have.  Any surgeries you have had.  Any medical conditions you have.  Any problems you have had passing stool. What are the risks? Generally, this is a safe procedure. However, problems may occur, including:  Bleeding.  A tear in the intestine.  A reaction to medicines given during the exam.  Infection (rare). What happens before the procedure? Eating and drinking restrictions  Follow instructions from your health care provider about eating and drinking, which may include:  A few days before the procedure - follow a low-fiber diet. Avoid nuts, seeds, dried fruit, raw fruits, and vegetables.  1-3 days before the procedure - follow a clear liquid diet. Drink only clear liquids, such as clear broth or bouillon, black coffee or tea, clear juice, clear soft drinks or sports drinks, gelatin desert, and popsicles. Avoid any liquids that contain red or purple dye.  On the day of the procedure - do not eat or drink anything during the 2 hours before the procedure, or within the time period that your health care provider recommends. Bowel prep  If you were prescribed an oral bowel prep to clean out your colon:  Take it as told by your health care provider. Starting the day before your procedure, you will need to drink a large amount of medicated liquid. The liquid will cause you to have multiple loose stools until your stool is almost clear or light green.  If your skin or anus gets irritated from diarrhea, you may use these to relieve the irritation:  Medicated wipes, such as adult wet wipes with aloe and vitamin E.  A skin soothing-product like petroleum jelly.  If you vomit while drinking the bowel prep, take a break for up to 60 minutes and then begin the bowel prep again. If vomiting continues and you cannot take the bowel prep without vomiting, call your health care provider. General instructions  Ask your health care  provider about changing or stopping your regular medicines. This is especially important if you are taking diabetes medicines or blood thinners.  Plan to have someone take you home from the hospital or clinic. What happens during the procedure?  An IV tube may be inserted into one of your veins.  You will be given medicine to help you relax (sedative).  To reduce your risk of infection:  Your health care team will wash or sanitize their hands.  Your anal area will be washed with soap.  You will be asked to lie on your side with your knees bent.  Your health care provider will lubricate a long, thin, flexible tube. The tube will have a camera and a light on the end.  The tube will be inserted into your anus.  The tube will be gently eased through your rectum and colon.  Air will be delivered into your colon to keep it open. You may feel some pressure or cramping.  The camera will be used to take images during the procedure.  A small tissue sample may be removed from your body to be examined under a microscope (biopsy). If any potential problems are found, the tissue will be sent to a lab for testing.  If small polyps are found, your health care provider may remove them and have them checked for cancer cells.  The tube that was inserted into your anus will be slowly removed. The procedure may vary among health care providers and hospitals. What happens after the procedure?  Your blood pressure, heart rate, breathing rate, and blood oxygen level will be monitored until the medicines you were given have worn off.  Do not drive for 24 hours after the exam.  You may have a small amount of blood in your stool.  You may pass gas and have mild abdominal cramping or bloating due to the air that was used to inflate your colon during the exam.  It is up to you to get the results of your procedure. Ask your health care provider, or the department performing the procedure, when your  results will be ready. This information is not intended to replace advice given to you by your health care provider. Make sure you discuss any questions you have with your health care provider. Document Released: 06/07/2000 Document Revised: 12/29/2015 Document Reviewed: 08/22/2015 Elsevier Interactive Patient Education  2017 [employer].

## 2016-08-19 ENCOUNTER — Encounter (HOSPITAL_COMMUNITY): Payer: Self-pay

## 2016-08-19 ENCOUNTER — Encounter (HOSPITAL_COMMUNITY)
Admission: RE | Admit: 2016-08-19 | Discharge: 2016-08-19 | Disposition: A | Discharge status: Home / Self Care | Payer: No Typology Code available for payment source | Source: Ambulatory Visit | Attending: Internal Medicine | Admitting: Internal Medicine

## 2016-08-19 DIAGNOSIS — Z01812 Encounter for preprocedural laboratory examination: Secondary | ICD-10-CM

## 2016-08-19 DIAGNOSIS — Z1211 Encounter for screening for malignant neoplasm of colon: Secondary | ICD-10-CM | POA: Diagnosis present

## 2016-08-19 DIAGNOSIS — K621 Rectal polyp: Secondary | ICD-10-CM | POA: Diagnosis not present

## 2016-08-19 DIAGNOSIS — E785 Hyperlipidemia, unspecified: Secondary | ICD-10-CM | POA: Diagnosis not present

## 2016-08-19 DIAGNOSIS — K219 Gastro-esophageal reflux disease without esophagitis: Secondary | ICD-10-CM | POA: Diagnosis not present

## 2016-08-19 DIAGNOSIS — K449 Diaphragmatic hernia without obstruction or gangrene: Secondary | ICD-10-CM

## 2016-08-19 DIAGNOSIS — I2510 Atherosclerotic heart disease of native coronary artery without angina pectoris: Secondary | ICD-10-CM | POA: Diagnosis not present

## 2016-08-19 DIAGNOSIS — I10 Essential (primary) hypertension: Secondary | ICD-10-CM | POA: Diagnosis not present

## 2016-08-19 DIAGNOSIS — Z7984 Long term (current) use of oral hypoglycemic drugs: Secondary | ICD-10-CM | POA: Diagnosis not present

## 2016-08-19 DIAGNOSIS — Z7982 Long term (current) use of aspirin: Secondary | ICD-10-CM | POA: Diagnosis not present

## 2016-08-19 DIAGNOSIS — I252 Old myocardial infarction: Secondary | ICD-10-CM | POA: Diagnosis not present

## 2016-08-19 DIAGNOSIS — E119 Type 2 diabetes mellitus without complications: Secondary | ICD-10-CM | POA: Diagnosis not present

## 2016-08-19 DIAGNOSIS — Z79899 Other long term (current) drug therapy: Secondary | ICD-10-CM | POA: Diagnosis not present

## 2016-08-19 DIAGNOSIS — Z87891 Personal history of nicotine dependence: Secondary | ICD-10-CM | POA: Diagnosis not present

## 2016-08-19 HISTORY — DX: Other specified postprocedural states: Z98.890

## 2016-08-19 HISTORY — DX: Adverse effect of unspecified anesthetic, initial encounter: T41.45XA

## 2016-08-19 HISTORY — DX: Nausea with vomiting, unspecified: R11.2

## 2016-08-19 LAB — BASIC METABOLIC PANEL
ANION GAP: 10 (ref 5–15)
BUN: 16 mg/dL (ref 6–20)
CALCIUM: 9.6 mg/dL (ref 8.9–10.3)
CO2: 25 mmol/L (ref 22–32)
Chloride: 103 mmol/L (ref 101–111)
Creatinine, Ser: 1.04 mg/dL (ref 0.61–1.24)
GFR calc non Af Amer: >60 mL/min (ref >60)
Glucose, Bld: 122 mg/dL — ABNORMAL HIGH (ref 65–99)
Potassium: 3.9 mmol/L (ref 3.5–5.1)
Sodium: 138 mmol/L (ref 135–145)

## 2016-08-19 LAB — CBC
HCT: 44.5 % (ref 39.0–52.0)
Hemoglobin: 16 g/dL (ref 13.0–17.0)
MCH: 32.8 pg (ref 26.0–34.0)
MCHC: 36 g/dL (ref 30.0–36.0)
MCV: 91.2 fL (ref 78.0–100.0)
PLATELETS: 236 10*3/uL (ref 150–400)
RBC: 4.88 MIL/uL (ref 4.22–5.81)
RDW: 11.7 % (ref 11.5–15.5)
WBC: 10 10*3/uL (ref 4.0–10.5)

## 2016-08-22 ENCOUNTER — Ambulatory Visit (HOSPITAL_COMMUNITY): Payer: No Typology Code available for payment source | Admitting: Anesthesiology

## 2016-08-22 ENCOUNTER — Ambulatory Visit (HOSPITAL_COMMUNITY)
Admission: RE | Admit: 2016-08-22 | Discharge: 2016-08-22 | Disposition: A | Discharge status: Home / Self Care | Payer: No Typology Code available for payment source | Source: Ambulatory Visit | Attending: Internal Medicine | Admitting: Internal Medicine

## 2016-08-22 ENCOUNTER — Encounter (HOSPITAL_COMMUNITY)
Admission: RE | Disposition: A | Discharge status: Home / Self Care | Payer: Self-pay | Source: Ambulatory Visit | Attending: Internal Medicine

## 2016-08-22 ENCOUNTER — Encounter (HOSPITAL_COMMUNITY): Payer: Self-pay | Admitting: *Deleted

## 2016-08-22 DIAGNOSIS — I2510 Atherosclerotic heart disease of native coronary artery without angina pectoris: Secondary | ICD-10-CM | POA: Insufficient documentation

## 2016-08-22 DIAGNOSIS — K621 Rectal polyp: Secondary | ICD-10-CM | POA: Insufficient documentation

## 2016-08-22 DIAGNOSIS — Z7984 Long term (current) use of oral hypoglycemic drugs: Secondary | ICD-10-CM | POA: Insufficient documentation

## 2016-08-22 DIAGNOSIS — Z7982 Long term (current) use of aspirin: Secondary | ICD-10-CM | POA: Insufficient documentation

## 2016-08-22 DIAGNOSIS — Z1211 Encounter for screening for malignant neoplasm of colon: Secondary | ICD-10-CM | POA: Diagnosis not present

## 2016-08-22 DIAGNOSIS — E119 Type 2 diabetes mellitus without complications: Secondary | ICD-10-CM | POA: Insufficient documentation

## 2016-08-22 DIAGNOSIS — Z1212 Encounter for screening for malignant neoplasm of rectum: Secondary | ICD-10-CM

## 2016-08-22 DIAGNOSIS — I252 Old myocardial infarction: Secondary | ICD-10-CM | POA: Insufficient documentation

## 2016-08-22 DIAGNOSIS — Z79899 Other long term (current) drug therapy: Secondary | ICD-10-CM | POA: Insufficient documentation

## 2016-08-22 DIAGNOSIS — Z87891 Personal history of nicotine dependence: Secondary | ICD-10-CM | POA: Insufficient documentation

## 2016-08-22 DIAGNOSIS — I10 Essential (primary) hypertension: Secondary | ICD-10-CM | POA: Insufficient documentation

## 2016-08-22 DIAGNOSIS — E785 Hyperlipidemia, unspecified: Secondary | ICD-10-CM | POA: Insufficient documentation

## 2016-08-22 DIAGNOSIS — K219 Gastro-esophageal reflux disease without esophagitis: Secondary | ICD-10-CM | POA: Insufficient documentation

## 2016-08-22 HISTORY — PX: COLONOSCOPY WITH PROPOFOL: SHX5780

## 2016-08-22 HISTORY — PX: POLYPECTOMY: SHX5525

## 2016-08-22 LAB — GLUCOSE, CAPILLARY
GLUCOSE-CAPILLARY: 175 mg/dL — AB (ref 65–99)
Glucose-Capillary: 172 mg/dL — ABNORMAL HIGH (ref 65–99)

## 2016-08-22 SURGERY — COLONOSCOPY WITH PROPOFOL
Anesthesia: Monitor Anesthesia Care

## 2016-08-22 MED ORDER — LACTATED RINGERS IV SOLN
INTRAVENOUS | Status: DC
  Administered 2016-08-22: 07:00:00 via INTRAVENOUS

## 2016-08-22 MED ORDER — FENTANYL CITRATE (PF) 100 MCG/2ML IJ SOLN
INTRAMUSCULAR | Status: AC
  Filled 2016-08-22: qty 2

## 2016-08-22 MED ORDER — ONDANSETRON HCL 4 MG/2ML IJ SOLN
INTRAMUSCULAR | Status: AC
  Filled 2016-08-22: qty 2

## 2016-08-22 MED ORDER — MIDAZOLAM HCL 2 MG/2ML IJ SOLN
1.0000 mg | INTRAMUSCULAR | Status: AC
  Administered 2016-08-22: 2 mg via INTRAVENOUS
  Filled 2016-08-22: qty 2

## 2016-08-22 MED ORDER — PROPOFOL 500 MG/50ML IV EMUL
INTRAVENOUS | Status: DC | PRN
  Administered 2016-08-22: 125 ug/kg/min via INTRAVENOUS
  Administered 2016-08-22: 08:00:00 via INTRAVENOUS

## 2016-08-22 MED ORDER — FENTANYL CITRATE (PF) 100 MCG/2ML IJ SOLN
25.0000 ug | INTRAMUSCULAR | Status: AC
  Administered 2016-08-22 (×2): 25 ug via INTRAVENOUS

## 2016-08-22 MED ORDER — MIDAZOLAM HCL 2 MG/2ML IJ SOLN
INTRAMUSCULAR | Status: AC
  Filled 2016-08-22: qty 2

## 2016-08-22 MED ORDER — PROPOFOL 10 MG/ML IV BOLUS
INTRAVENOUS | Status: AC
  Filled 2016-08-22: qty 40

## 2016-08-22 MED ORDER — ONDANSETRON HCL 4 MG/2ML IJ SOLN
4.0000 mg | Freq: Once | INTRAMUSCULAR | Status: AC
  Administered 2016-08-22: 4 mg via INTRAVENOUS

## 2016-08-22 MED ORDER — MIDAZOLAM HCL 5 MG/5ML IJ SOLN
INTRAMUSCULAR | Status: DC | PRN
  Administered 2016-08-22: 2 mg via INTRAVENOUS

## 2016-08-22 NOTE — Discharge Instructions (Addendum)
PATIENT INSTRUCTIONS POST-ANESTHESIA  IMMEDIATELY FOLLOWING SURGERY:  Do not drive or operate machinery for the first twenty four hours after surgery.  Do not make any important decisions for twenty four hours after surgery or while taking narcotic pain medications or sedatives.  If you develop intractable nausea and vomiting or a severe headache please notify your doctor immediately.  FOLLOW-UP:  Please make an appointment with your surgeon as instructed. You do not need to follow up with anesthesia unless specifically instructed to do so.  WOUND CARE INSTRUCTIONS (if applicable):  Keep a dry clean dressing on the anesthesia/puncture wound site if there is drainage.  Once the wound has quit draining you may leave it open to air.  Generally you should leave the bandage intact for twenty four hours unless there is drainage.  If the epidural site drains for more than 36-48 hours please call the anesthesia department.  QUESTIONS?:  Please feel free to call your physician or the hospital operator if you have any questions, and they will be happy to assist you.       Colonoscopy Discharge Instructions  Read the instructions outlined below and refer to this sheet in the next few weeks. These discharge instructions provide you with general information on caring for yourself after you leave the hospital. Your doctor may also give you specific instructions. While your treatment has been planned according to the most current medical practices available, unavoidable complications occasionally occur. If you have any problems or questions after discharge, call Dr. Gala Romney at [phone]. ACTIVITY  You may resume your regular activity, but move at a slower pace for the next 24 hours.   Take frequent rest periods for the next 24 hours.   Walking will help get rid of the air and reduce the bloated feeling in your belly (abdomen).   No driving for 24 hours (because of the medicine (anesthesia) used during the  test).    Do not sign any important legal documents or operate any machinery for 24 hours (because of the anesthesia used during the test).  NUTRITION  Drink plenty of fluids.   You may resume your normal diet as instructed by your doctor.   Begin with a light meal and progress to your normal diet. Heavy or fried foods are harder to digest and may make you feel sick to your stomach (nauseated).   Avoid alcoholic beverages for 24 hours or as instructed.  MEDICATIONS  You may resume your normal medications unless your doctor tells you otherwise.  WHAT YOU CAN EXPECT TODAY  Some feelings of bloating in the abdomen.   Passage of more gas than usual.   Spotting of blood in your stool or on the toilet paper.  IF YOU HAD POLYPS REMOVED DURING THE COLONOSCOPY:  No aspirin products for 7 days or as instructed.   No alcohol for 7 days or as instructed.   Eat a soft diet for the next 24 hours.  FINDING OUT THE RESULTS OF YOUR TEST Not all test results are available during your visit. If your test results are not back during the visit, make an appointment with your caregiver to find out the results. Do not assume everything is normal if you have not heard from your caregiver or the medical facility. It is important for you to follow up on all of your test results.  SEEK IMMEDIATE MEDICAL ATTENTION IF:  You have more than a spotting of blood in your stool.   Your belly is swollen (  abdominal distention).   You are nauseated or vomiting.   You have a temperature over 101.   You have abdominal pain or discomfort that is severe or gets worse throughout the day.   Further recommendations to follow pending review of pathology report  Office visit in 6 months

## 2016-08-22 NOTE — Anesthesia Preprocedure Evaluation (Signed)
Anesthesia Evaluation  Patient identified by MRN, date of birth, ID band Patient awake    Reviewed: Allergy & Precautions, NPO status , Patient's Chart, lab work & pertinent test results  History of Anesthesia Complications (+) PONV and history of anesthetic complications  Airway Mallampati: II  TM Distance: >3 FB Neck ROM: Full    Dental  (+) Teeth Intact, Caps,    Pulmonary former smoker,    breath sounds clear to auscultation       Cardiovascular hypertension, Pt. on medications (-) angina+ CAD and + Past MI   Rhythm:Regular Rate:Normal     Neuro/Psych    GI/Hepatic PUD, GERD  Poorly Controlled,(+) Hepatitis - ([facility])  Endo/Other  diabetes, Type 2, Oral Hypoglycemic Agents  Renal/GU      Musculoskeletal   Abdominal   Peds  Hematology   Anesthesia Other Findings   Reproductive/Obstetrics                             Anesthesia Physical Anesthesia Plan  ASA: III  Anesthesia Plan: MAC   Post-op Pain Management:    Induction: Intravenous  Airway Management Planned: Simple Face Mask  Additional Equipment:   Intra-op Plan:   Post-operative Plan:   Informed Consent: I have reviewed the patients History and Physical, chart, labs and discussed the procedure including the risks, benefits and alternatives for the proposed anesthesia with the patient or authorized representative who has indicated his/her understanding and acceptance.     Plan Discussed with:   Anesthesia Plan Comments:         Anesthesia Quick Evaluation

## 2016-08-22 NOTE — Anesthesia Postprocedure Evaluation (Signed)
Anesthesia Post Note  Patient: Jerome Shepherd  Procedure(s) Performed: Procedure(s) (LRB): COLONOSCOPY WITH PROPOFOL (N/A) POLYPECTOMY  Patient location during evaluation: PACU Anesthesia Type: MAC Level of consciousness: awake and patient cooperative Pain management: pain level controlled Vital Signs Assessment: post-procedure vital signs reviewed and stable Respiratory status: spontaneous breathing, nonlabored ventilation and respiratory function stable Cardiovascular status: blood pressure returned to baseline Postop Assessment: adequate PO intake Anesthetic complications: no     Last Vitals:  Vitals:   08/22/16 0719 08/22/16 0727  BP: 112/61 125/63  Resp: 11 16  Temp:      Last Pain:  Vitals:   08/22/16 0653  TempSrc: Oral                 Kharter Sestak J

## 2016-08-22 NOTE — H&P (View-Only) (Signed)
Primary Care Physician: Glenda Chroman, MD  Primary Gastroenterologist:  Garfield Cornea, MD   Chief Complaint  Patient presents with  . Gastroesophageal Reflux  . Abdominal Pain    epigastric. Wants to discuss hiatal hernia    HPI: Jerome Shepherd is a 51 y.o. male here for follow-up. He has a history of [name] with grade 1 fibrosis on liver biopsy 2015. EGD August 2017 with normal esophagus status post empiric dilation, small hiatal hernia. When I last saw him in November his swallowing was somewhat improved. Was having difficulty with liquids "stopping" in the upper esophagus like if a air pocket blocked passage. Complained of bad breath. Offered BPE to evaluate for possible Zenker's but he declined. Patient hospitalized last fall with? Pyelonephritis but 3 days after admission he developed shingles as an etiology for his left flank pain.CT abdomen pelvis without contrast at that time showed diffuse fatty liver, small left periumbilical hernia containing fat.  Patient presents today to discuss having first ever screening colonoscopy. Also the most discuss ongoing upper GI symptoms. Doesn't happen daily but some days he gets epigastric pain that radiates up into the center of his chest and into both shoulders. He did see his cardiologist when this first started back last summer. Symptoms in the setting of stress related to death of his mother and of his sister age 65 of sudden MI. No suspected cardiac issues based on previous workup, suspected acute grief reaction.  Patient states that he gets typical heartburn as well, takes pantoprazole 40 mg daily but may take additional 6-8 over-the-counter Zantac every day. Sometimes he is able to eat something to relieve his heartburn and he goes way for the rest the day. Sometimes he has to take multiple Zantac. When his heartburn is flaring, he has loose explosive stools as well. When he does not have any upper GI symptoms/heartburn, his stools are  solid. Usually occurs 1-2 times per week with loose stools. No melena rectal bleeding. Rarely constipated. He wonders if he should have his hiatal hernia repair.  He neurologist studies gained 10 pounds since we last saw him. He also notes that his diabetes is poorly controlled and that he is not eating right or exercising. 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   Current Outpatient Prescriptions  Medication Sig Dispense Refill  . amLODipine (NORVASC) 10 MG tablet TAKE 1 TABLET BY MOUTH EVERY DAY 30 tablet 6  . aspirin EC 81 MG tablet Take 1 tablet (81 mg total) by mouth daily.    . DiphenhydrAMINE HCl, Sleep, (SIMPLY SLEEP PO) Take 1-2 tablets by mouth at bedtime as needed (sleep).    . EDARBI 40 MG TABS TAKE ONE TABLET BY MOUTH DAILY. 30 tablet 9  . glimepiride (AMARYL) 4 MG tablet Take 4 mg by mouth daily.   3  . glyBURIDE micronized (GLYNASE) 6 MG tablet Take 6 mg by mouth daily with breakfast. Reported on 12/25/2015    . isosorbide mononitrate (IMDUR) 60 MG 24 hr tablet TAKE 1 TABLET BY MOUTH EVERY DAY 30 tablet 6  . metFORMIN (GLUCOPHAGE) 500 MG tablet Take 500 mg by mouth daily with breakfast.     . nitroGLYCERIN (NITROSTAT) 0.4 MG SL tablet Place 1 tablet (0.4 mg total) under the tongue every 5 (five) minutes as needed for chest pain. 25 tablet 3  . pantoprazole (PROTONIX) 40 MG tablet Take 40 mg by mouth daily.    Marland Kitchen  ranitidine (ZANTAC) 150 MG tablet Take 300 mg by mouth 5 (five) times daily as needed for heartburn.     . simvastatin (ZOCOR) 40 MG tablet Take 40 mg by mouth at bedtime.     . simvastatin (ZOCOR) 40 MG tablet TAKE 1 TABLET BY MOUTH EVERY EVENING 30 tablet 6  . sitaGLIPtin (JANUVIA) 100 MG tablet Take 100 mg by mouth daily.     No current facility-administered medications for this visit.     Allergies as of 07/25/2016 - Review Complete 07/25/2016  Allergen Reaction Noted  . Codeine Nausea Only 08/18/2011  . Sulfa antibiotics Nausea Only  08/18/2011   Past Medical History:  Diagnosis Date  . 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 CAD (coronary artery disease), minimal, non obstructive 2013 01/01/2013  . Constipation   . Coronary artery spasm ([place]) 2013  . Diabetes mellitus   . GERD (gastroesophageal reflux disease)   . Hyperlipidemia   . Hypertension   . Myocardial infarction    2012   Past Surgical History:  Procedure Laterality Date  . CARDIAC CATHETERIZATION  2013   non obstructive CAD  ~20%  . CHOLECYSTECTOMY N/A 04/01/2014   Procedure: LAPAROSCOPIC CHOLECYSTECTOMY;  Surgeon: Jamesetta So, MD;  Location: [facility] ORS;  Service: General;  Laterality: N/A;  . ESOPHAGOGASTRODUODENOSCOPY N/A 01/10/2014   Dr. Gala Romney: antral erosions, path with chronic inactive gastritis. Empiric dilation with 56-F dilation  . ESOPHAGOGASTRODUODENOSCOPY (EGD) WITH PROPOFOL N/A 02/16/2016   Dr. Gala Romney: small hiatal hernia, normal esopagus s/p empiric dilation  . KNEE ARTHROSCOPY Right 2002  . LEFT HEART CATH N/A 08/18/2011   Procedure: LEFT HEART CATH;  Surgeon: Lorretta Harp, MD;  Location: [facility] CATH LAB;  Service: Cardiovascular;  Laterality: N/A;  . LIVER BIOPSY N/A 04/01/2014   mild fibrosis  . MALONEY DILATION N/A 01/10/2014   Procedure: Venia Minks DILATION;  Surgeon: Daneil Dolin, MD;  Location: [facility] SUITE;  Service: Endoscopy;  Laterality: N/A;  Venia Minks DILATION N/A 02/16/2016   Procedure: Venia Minks DILATION;  Surgeon: Daneil Dolin, MD;  Location: [facility];  Service: Endoscopy;  Laterality: N/A;  . SAVORY DILATION N/A 01/10/2014   Procedure: SAVORY DILATION;  Surgeon: Daneil Dolin, MD;  Location: [facility] SU[facility];  Service: Endoscopy;  Laterality: N/A;   Family History  Problem Relation Age of Onset  . Diabetes type II Sister   . Heart attack Cousin   . Colon cancer Neg Hx   . Stomach cancer Neg Hx    Social History   Social History  . Marital status: Single    Spouse name:  N/A  . Number of children: N/A  . Years of education: N/A   Occupational History  . truck driver Product manager   Social History Main Topics  . Smoking status: Former Smoker    Quit date: 08/18/1995  . Smokeless tobacco: Never Used     Comment: Quit smoking x 20 years  . Alcohol use No  . Drug use: No  . Sexual activity: Not Currently   Other Topics Concern  . None   Social History Narrative   Divorced, no children, works as a Production designer, theatre/television/film.    ROS:  General: Negative for anorexia, weight loss, fever, chills, fatigue, weakness. ENT: Negative for hoarseness, difficulty swallowing , nasal congestion. CV: Negative for chest pain, angina, palpitations, dyspnea on exertion, peripheral edema.  Respiratory: Negative for dyspnea at rest,  dyspnea on exertion, cough, sputum, wheezing.  GI: See history of present illness. GU:  Negative for dysuria, hematuria, urinary incontinence, urinary frequency, nocturnal urination.  Endo: Negative for unusual weight change.    Physical Examination:   BP (!) 145/84   Pulse 79   Temp 98.5 F (36.9 C) (Oral)   Ht 6\' 4"  (1.93 m)   Wt (!) 320 lb (145.2 kg)   BMI 38.95 kg/m   General: Well-nourished, well-developed in no acute distress.   Eyes: No icterus. Mouth: Oropharyngeal mucosa moist and pink , no lesions erythema or exudate. Lungs: Clear to auscultation bilaterally.  Heart: Regular rate and rhythm, no murmurs rubs or gallops.  Abdomen: Bowel sounds are normal,  obese, nontender, no hepatosplenomegaly or masses, no abdominal bruits. I did not appreciate periumbilical hernia.    Extremities: No lower extremity edema. No clubbing or deformities. Neuro: Alert and oriented x 4   Skin: Warm and dry, no jaundice.   Psych: Alert and cooperative, normal mood and affect.  Labs:  Lab Results  Component Value Date   ALT 19 03/12/2016   AST 30 03/12/2016   ALKPHOS 49 03/12/2016   BILITOT 0.8 03/12/2016   Lab Results  Component  Value Date   CREATININE 1.03 03/19/2016   BUN 13 03/18/2016   NA 137 03/18/2016   K 4.1 03/18/2016   CL 104 03/18/2016   CO2 25 03/18/2016   Lab Results  Component Value Date   WBC 6.2 03/18/2016   HGB 13.8 03/18/2016   HCT 39.8 03/18/2016   MCV 88.8 03/18/2016   PLT 225 03/18/2016     Imaging Studies: No results found.

## 2016-08-22 NOTE — Interval H&P Note (Signed)
History and Physical Interval Note:  08/22/2016 7:27 AM  Jerome Shepherd  has presented today for surgery, with the diagnosis of SCREENING  The various methods of treatment have been discussed with the patient and family. After consideration of risks, benefits and other options for treatment, the patient has consented to  Procedure(s) with comments: COLONOSCOPY WITH PROPOFOL (N/A) - 730  as a surgical intervention .  The patient's history has been reviewed, patient examined, no change in status, stable for surgery.  I have reviewed the patient's chart and labs.  Questions were answered to the patient's satisfaction.     No change; first ever screening tcs; The risks, benefits, limitations, alternatives and imponderables have been reviewed with the patient. Questions have been answered. All parties are agreeable.    [name]

## 2016-08-22 NOTE — Op Note (Signed)
[facility] Patient Name: Jerome Shepherd Procedure Date: 08/22/2016 7:35 AM MRN: [id number] Date of Birth: 08/14/1965 Attending MD: Norvel Richards , MD CSN: [id number] Age: 51 Admit Type: Outpatient Procedure:                Colonoscopy with biopsy Indications:              Screening for colorectal malignant neoplasm Providers:                Norvel Richards, MD, Lurline Del, RN, Purcell Nails.                            [place], Merchant navy officer Referring MD:              Medicines:                Propofol per Anesthesia Complications:            No immediate complications. Estimated Blood Loss:     Estimated blood loss was minimal. Estimated blood                            loss: none. Estimated blood loss: none. Procedure:                Pre-Anesthesia Assessment:                           - Prior to the procedure, a History and Physical                            was performed, and patient medications and                            allergies were reviewed. The patient's tolerance of                            previous anesthesia was also reviewed. The risks                            and benefits of the procedure and the sedation                            options and risks were discussed with the patient.                            All questions were answered, and informed consent                            was obtained. Prior Anticoagulants: The patient has                            taken no previous anticoagulant or antiplatelet                            agents. ASA Grade Assessment: II - A patient with  mild systemic disease. After reviewing the risks                            and benefits, the patient was deemed in                            satisfactory condition to undergo the procedure.                           After obtaining informed consent, the colonoscope                            was passed under direct vision. Throughout the                  procedure, the patient's blood pressure, pulse, and                            oxygen saturations were monitored continuously. The                            EC-[id number]) scope was introduced through                            the anus and advanced to the the cecum, identified                            by appendiceal orifice and ileocecal valve. The                            ileocecal valve, appendiceal orifice, and rectum                            were photographed. The ileocecal valve, appendiceal                            orifice, and rectum were photographed. The entire                            colon was well visualized. The quality of the bowel                            preparation was adequate. Scope In: 7:42:16 AM Scope Out: [id number] AM Scope Withdrawal Time: 0 hours 8 minutes 25 seconds  Total Procedure Duration: 0 hours 14 minutes 41 seconds  Findings:      The perianal and digital rectal examinations were normal.      A 3 mm polyp was found in the rectum. The polyp was sessile. The polyp       was removed with a cold biopsy forceps. Resection and retrieval were       complete. Estimated blood loss was minimal.      The entire examined colon appeared normal on direct and retroflexion       views. Impression:               - One 3 mm polyp in  the rectum, removed with a cold                            biopsy forceps. Resected and retrieved.                           - The entire examined colon is normal on direct and                            retroflexion views. Moderate Sedation:      Moderate (conscious) sedation was personally administered by an       anesthesia professional. The following parameters were monitored: oxygen       saturation, heart rate, blood pressure, respiratory rate, EKG, adequacy       of pulmonary ventilation, and response to care. Total physician       intraservice time was 20 minutes. Recommendation:           - Patient has  a contact number available for                            emergencies. The signs and symptoms of potential                            delayed complications were discussed with the                            patient. Return to normal activities tomorrow.                            Written discharge instructions were provided to the                            patient.                           - Resume previous diet.                           - Continue present medications.                           - Repeat colonoscopy date to be determined after                            pending pathology results are reviewed for                            surveillance based on pathology results.                           - Return to GI office in 6 months. Procedure Code(s):        --- Professional ---                           [phone], Colonoscopy, flexible; with biopsy, single  or multiple Diagnosis Code(s):        --- Professional ---                           Z12.11, Encounter for screening for malignant                            neoplasm of colon                           K62.1, Rectal polyp CPT copyright 2016 American Medical Association. All rights reserved. The codes documented in this report are preliminary and upon coder review may  be revised to meet current compliance requirements. Cristopher Estimable. Dhruv Christina, MD Norvel Richards, MD 08/22/2016 3:29:44 PM This report has been signed electronically. Number of Addenda: 0

## 2016-08-22 NOTE — Transfer of Care (Signed)
Immediate Anesthesia Transfer of Care Note  Patient: Jerome Shepherd  Procedure(s) Performed: Procedure(s) with comments: COLONOSCOPY WITH PROPOFOL (N/A) - 730  POLYPECTOMY  Patient Location: PACU  Anesthesia Type:MAC  Level of Consciousness: awake and patient cooperative  Airway & Oxygen Therapy: Patient Spontanous Breathing and Patient connected to face mask oxygen  Post-op Assessment: Report given to RN, Post -op Vital signs reviewed and stable and Patient moving all extremities  Post vital signs: Reviewed and stable  Last Vitals:  Vitals:   08/22/16 0719 08/22/16 0727  BP: 112/61 125/63  Resp: 11 16  Temp:      Last Pain:  Vitals:   08/22/16 0653  TempSrc: Oral      Patients Stated Pain Goal: 8 (21/97/58 8325)  Complications: No apparent anesthesia complications

## 2016-08-25 ENCOUNTER — Encounter: Payer: Self-pay | Admitting: Internal Medicine

## 2016-08-26 ENCOUNTER — Encounter (HOSPITAL_COMMUNITY): Payer: Self-pay | Admitting: Internal Medicine

## 2016-10-29 ENCOUNTER — Encounter: Payer: Self-pay | Admitting: Internal Medicine

## 2016-11-02 ENCOUNTER — Other Ambulatory Visit: Payer: Self-pay | Admitting: Cardiovascular Disease

## 2016-12-07 ENCOUNTER — Other Ambulatory Visit: Payer: Self-pay | Admitting: Cardiovascular Disease

## 2016-12-20 ENCOUNTER — Other Ambulatory Visit: Payer: Self-pay

## 2016-12-20 MED ORDER — ESOMEPRAZOLE MAGNESIUM 40 MG PO CPDR: 40.0000 mg | DELAYED_RELEASE_CAPSULE | Freq: Two times a day (BID) | ORAL | 5 refills | Status: DC

## 2016-12-22 ENCOUNTER — Other Ambulatory Visit: Payer: Self-pay | Admitting: Cardiovascular Disease

## 2017-01-15 ENCOUNTER — Other Ambulatory Visit: Payer: Self-pay | Admitting: Cardiovascular Disease

## 2017-02-10 IMAGING — CT CT RENAL STONE PROTOCOL
2 of 4 series · 16 of 46 positions shown, 18 images · non-contrast
Comparison: 03/03/2016

CLINICAL DATA: Gradual onset left flank pain. Pain radiates to the
left groin and left testis.

EXAM:
CT ABDOMEN AND PELVIS WITHOUT CONTRAST
TECHNIQUE: Multidetector CT imaging of the abdomen and pelvis was performed
following the standard protocol without IV contrast.

[Series 2: routine abd pel with · axial · 0.98mm/px · z∈[-733,-118]mm · 13 of 135 slices shown, 15 images]
[im 6/135  soft-tissue]
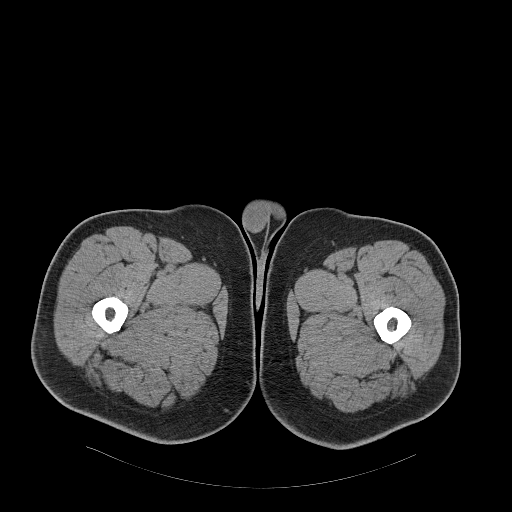
[im 6/135  bone]
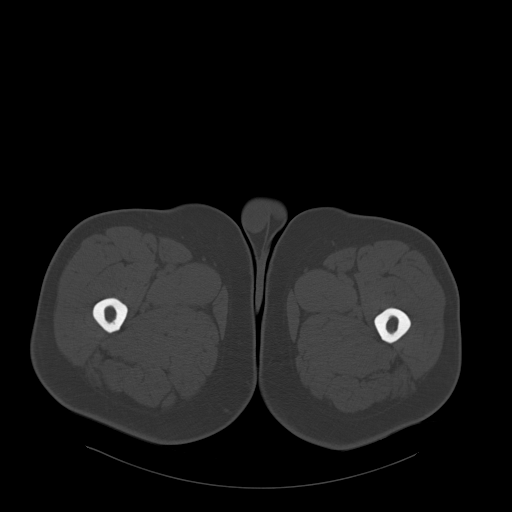
[im 17/135  soft-tissue]
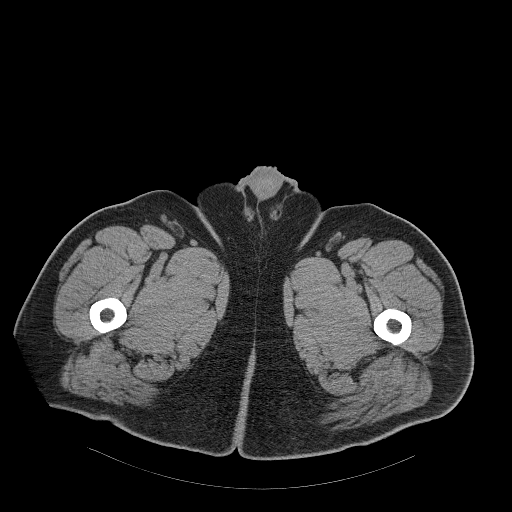
[im 27/135  soft-tissue]
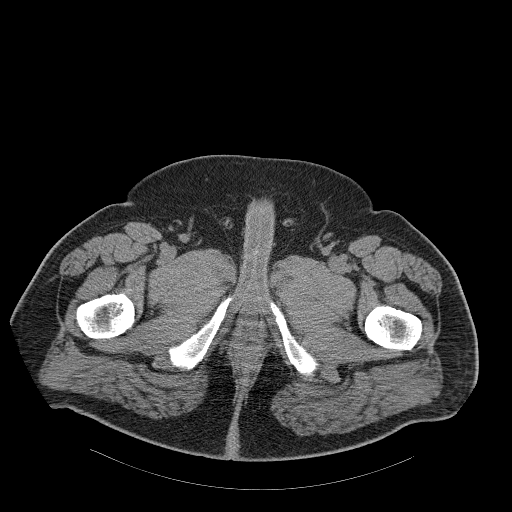
[im 38/135  soft-tissue]
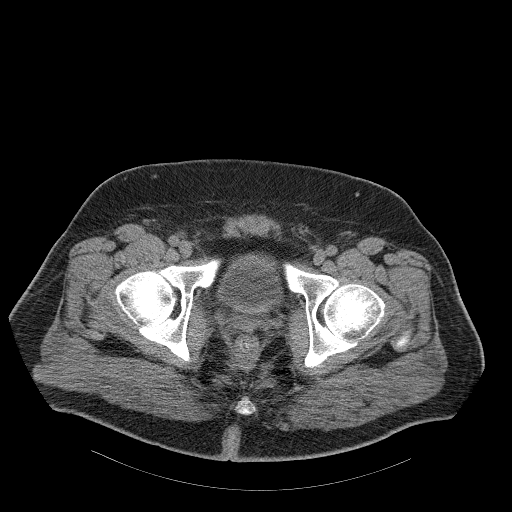
[im 49/135  soft-tissue]
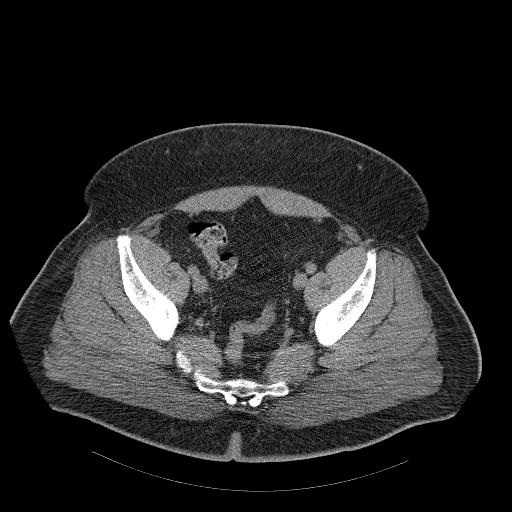
[im 59/135  soft-tissue]
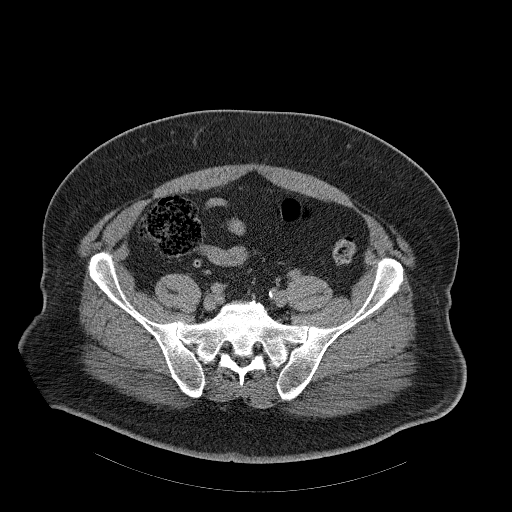
[im 70/135  soft-tissue]
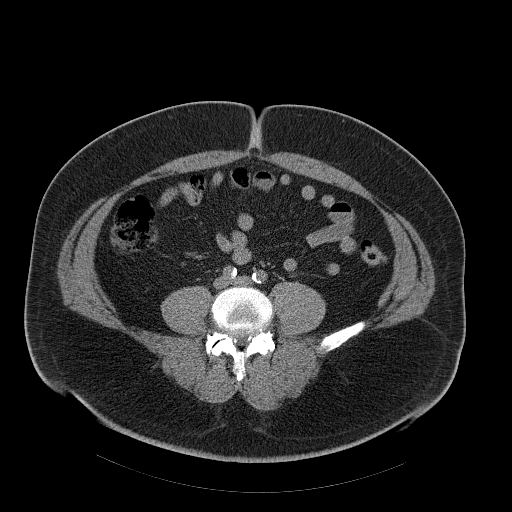
[im 76/135  soft-tissue]
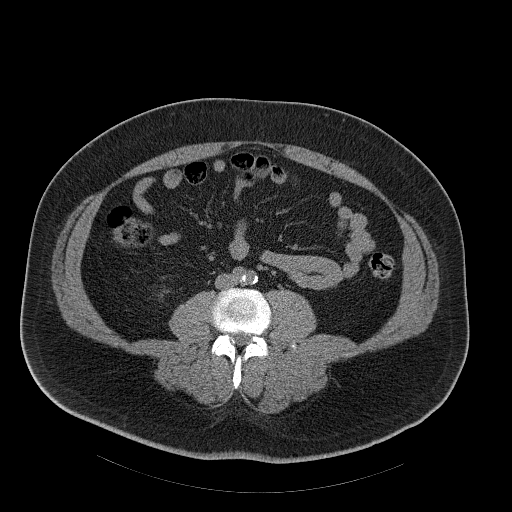
[im 86/135  soft-tissue]
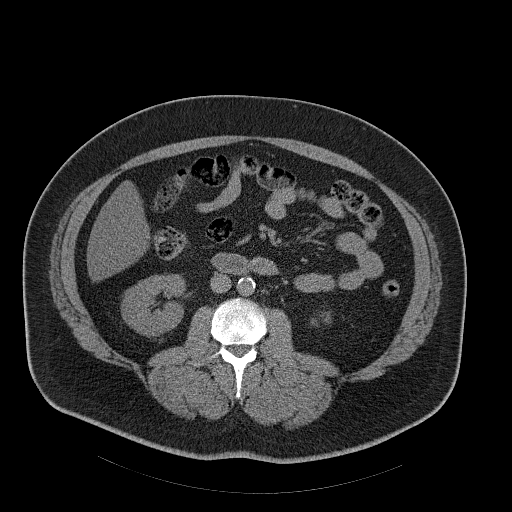
[im 86/135  bone]
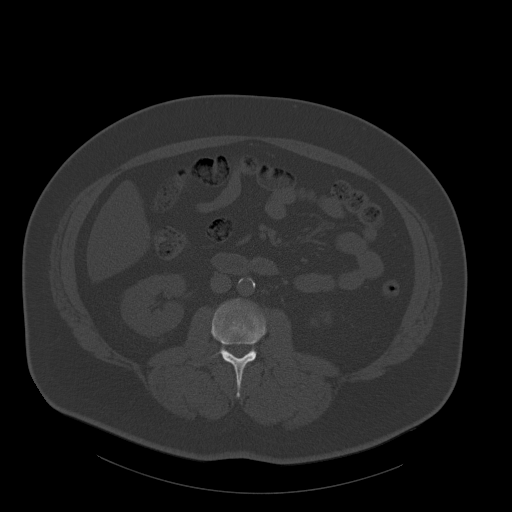
[im 97/135  soft-tissue]
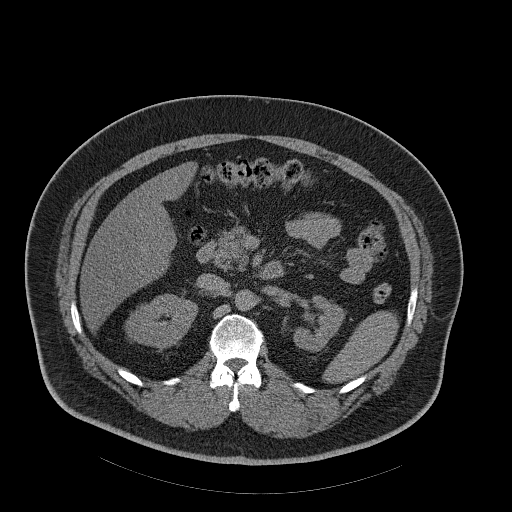
[im 108/135  soft-tissue]
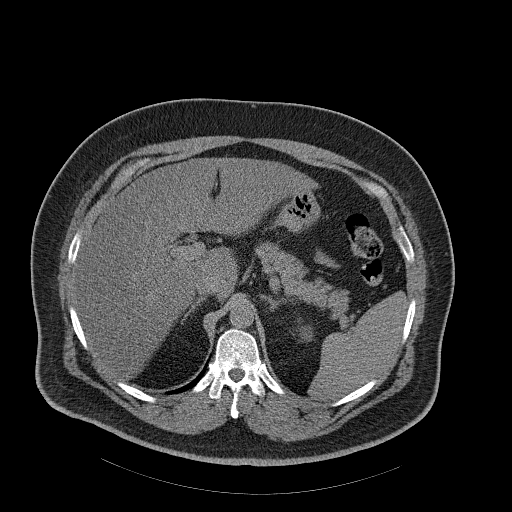
[im 118/135  soft-tissue]
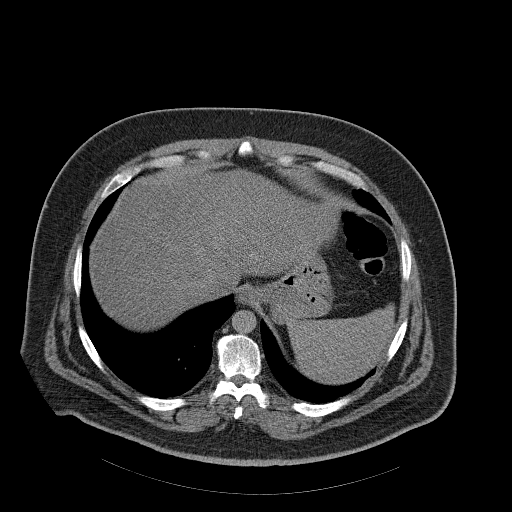
[im 129/135  soft-tissue]
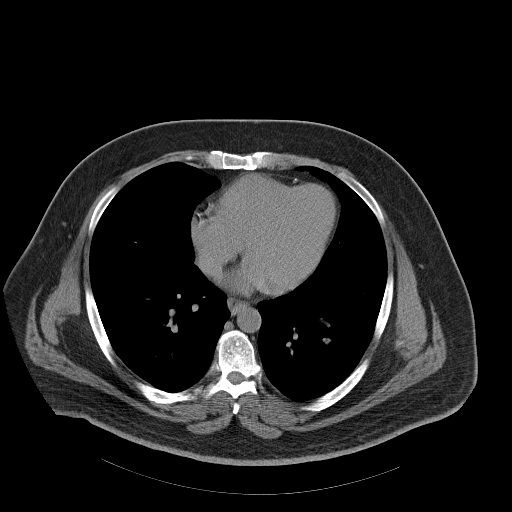

[Series 3: coronal · coronal · 0.94mm/px · 3 of 165 slices shown]
[im 55/165  soft-tissue]
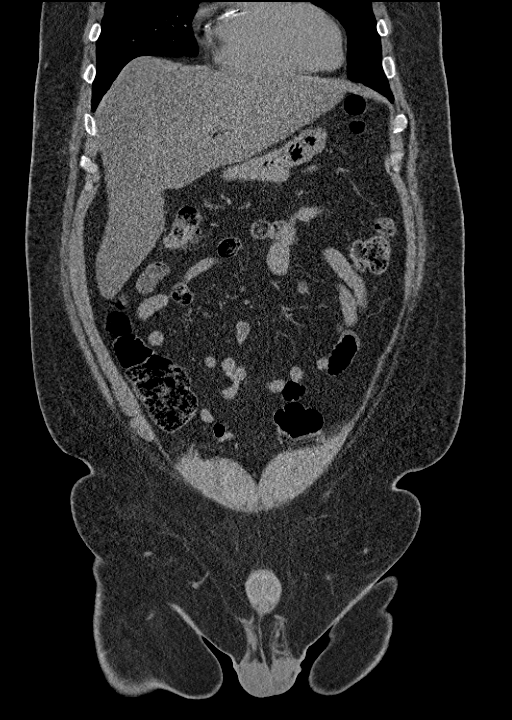
[im 73/165  soft-tissue]
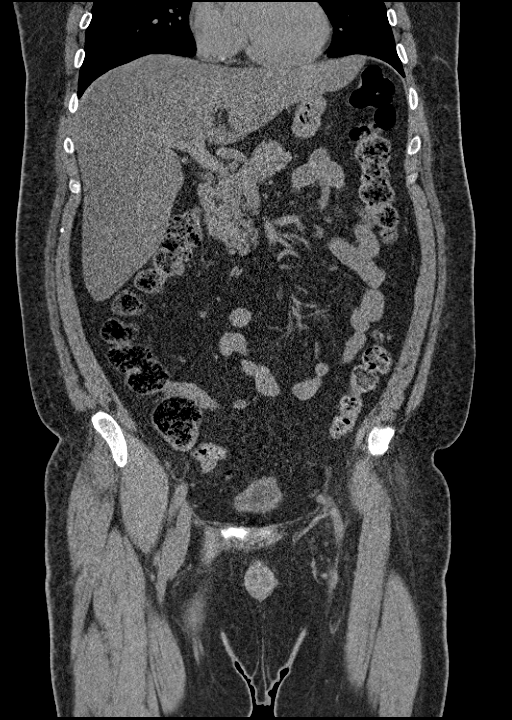
[im 92/165  soft-tissue]
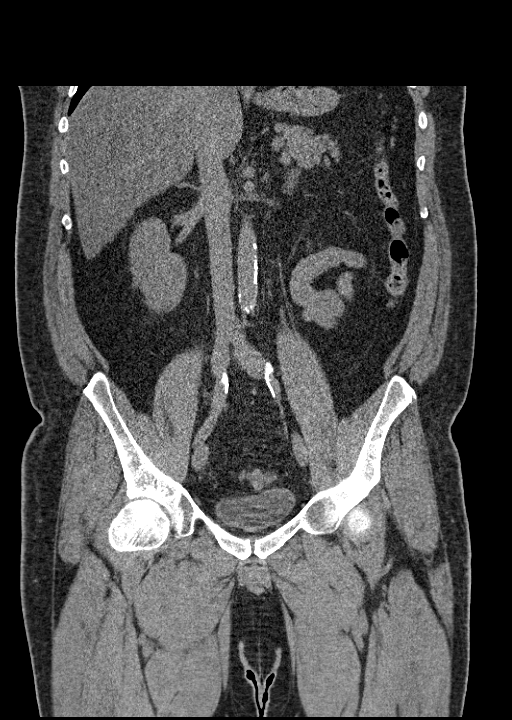

[16 of 46 positions shown; findings below may reference images not displayed]

FINDINGS: Lower chest: No acute abnormality.  Coronary artery calcifications.

Hepatobiliary: Diffuse fatty infiltration of the liver. Surgical
absence of the gallbladder. No bile duct dilatation.

Pancreas: Unenhanced appearance of the pancreas is unremarkable.

Spleen: Unenhanced appearance of the spleen is unremarkable.

Adrenals/Urinary Tract: No adrenal gland nodules. Kidneys appear
symmetrical in size and shape. No hydronephrosis or hydroureter. No
renal, ureteral, or bladder stones. No bladder wall thickening.

Stomach/Bowel: Unenhanced appearance is unremarkable. Stool-filled
colon without distention. Appendix is normal.

Vascular/Lymphatic: Aortic atherosclerosis. No enlarged abdominal or
pelvic lymph nodes.

Reproductive: Prostate gland is not enlarged.

Other: Small left periumbilical hernia containing fat. No inguinal
hernias in

Musculoskeletal: Mild degenerative changes in the spine. No
destructive bone lesions.
IMPRESSION: No renal or ureteral stone or obstruction. Diffuse fatty
infiltration of the liver. Small periumbilical hernia containing
fat.

## 2017-02-12 ENCOUNTER — Telehealth: Payer: Self-pay | Admitting: Cardiovascular Disease

## 2017-02-12 NOTE — Telephone Encounter (Signed)
Returned call to patient.  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  Needs to have done by 03/28/2017.  Explained to patient that he would need OV as he was last seen 11/23/2015.  OV moved up to 02/26/2017 at 1:30 with Estella Husk, PA in [place] office.

## 2017-02-12 NOTE — Telephone Encounter (Signed)
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

## 2017-02-26 ENCOUNTER — Ambulatory Visit (INDEPENDENT_AMBULATORY_CARE_PROVIDER_SITE_OTHER): Payer: No Typology Code available for payment source | Admitting: Physician Assistant

## 2017-02-26 ENCOUNTER — Encounter: Payer: Self-pay | Admitting: Physician Assistant

## 2017-02-26 ENCOUNTER — Encounter: Payer: Self-pay | Admitting: *Deleted

## 2017-02-26 VITALS — BP 136/78 | HR 84 | Ht 76.0 in | Wt 317.0 lb

## 2017-02-26 DIAGNOSIS — Z6838 Body mass index (BMI) 38.0-38.9, adult: Secondary | ICD-10-CM | POA: Diagnosis not present

## 2017-02-26 DIAGNOSIS — I2510 Atherosclerotic heart disease of native coronary artery without angina pectoris: Secondary | ICD-10-CM

## 2017-02-26 DIAGNOSIS — R072 Precordial pain: Secondary | ICD-10-CM

## 2017-02-26 DIAGNOSIS — I10 Essential (primary) hypertension: Secondary | ICD-10-CM

## 2017-02-26 NOTE — Progress Notes (Signed)
Cardiology Office Note    Date:  02/26/2017   ID:  Jerome Shepherd, DOB 1965/12/12, MRN [id number]  PCP:  Glenda Chroman, MD  Cardiologist: Dr. Bronson Ing  No chief complaint on file.   History of Present Illness:  Jerome Shepherd is a 51 y.o. male history of STEMI nonobstructive CAD and documented coronary vasospasm cardiac catheterization 2013. Low risk Lexi scan stress test 01/2015. Maintained on Imdur and amlodipine. Also has hypertension, diabetes mellitus and family history of CAD sleep apnea. Last seen in our office 11/2015 at which time he was suffering from acute grief from losing his mother and sister of an acute MI.  Complains of pain in back of left arm up into shoulder blades. Awakens him from sleep, hurts off/on but worse at night. Thinks he has rotator cuff problems. Mowed lawn and weed eating without symptoms. 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 Lasts 5-10 min and then eases spontaneously. Has esophageal spasms so it's hard for him to tell the difference. Has dyspnea on exertion when going up hill but no chest pain.Denies chest tightness, pressure,dyspnea at rest, orthopnea, dizziness or presyncope. 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 Sweats profusely and wants lexiscan done again.    Past Medical History:  Diagnosis Date  . 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 CAD (coronary artery disease), minimal, non obstructive 2013 01/01/2013  . Complication of anesthesia   . Constipation   . 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 Diabetes mellitus   . GERD (gastroesophageal reflux disease)   . Hyperlipidemia   . Hypertension   . Myocardial infarction ([place])    2012  . PONV (postoperative nausea and vomiting)     Past Surgical History:  Procedure Laterality Date  . CARDIAC CATHETERIZATION  2013   non obstructive CAD  ~20%  . CHOLECYSTECTOMY N/A  04/01/2014   Procedure: LAPAROSCOPIC CHOLECYSTECTOMY;  Surgeon: Jamesetta So, MD;  Location: [facility] ORS;  Service: General;  Laterality: N/A;  . COLONOSCOPY WITH PROPOFOL N/A 08/22/2016   Procedure: COLONOSCOPY WITH PROPOFOL;  Surgeon: Daneil Dolin, MD;  Location: [facility] ENDO SUITE;  Service: Endoscopy;  Laterality: N/A;  730   . ESOPHAGOGASTRODUODENOSCOPY N/A 01/10/2014   Dr. Gala Romney: antral erosions, path with chronic inactive gastritis. Empiric dilation with 56-F dilation  . ESOPHAGOGASTRODUODENOSCOPY (EGD) WITH PROPOFOL N/A 02/16/2016   Dr. Gala Romney: small hiatal hernia, normal esopagus s/p empiric dilation  . KNEE ARTHROSCOPY Right 2002  . LEFT HEART CATH N/A 08/18/2011   Procedure: LEFT HEART CATH;  Surgeon: Lorretta Harp, MD;  Location: [facility] CATH LAB;  Service: Cardiovascular;  Laterality: N/A;  . LIVER BIOPSY N/A 04/01/2014   mild fibrosis  . MALONEY DILATION N/A 01/10/2014   Procedure: Venia Minks DILATION;  Surgeon: Daneil Dolin, MD;  Location: [facility] SU[facility];  Service: Endoscopy;  Laterality: N/A;  Venia Minks DILATION N/A 02/16/2016   Procedure: Venia Minks DILATION;  Surgeon: Daneil Dolin, MD;  Location: [facility];  Service: Endoscopy;  Laterality: N/A;  . POLYPECTOMY  08/22/2016   Procedure: POLYPECTOMY;  Surgeon: Daneil Dolin, MD;  Location: [facility];  Service: Endoscopy;;  . Azzie Almas DILATION N/A 01/10/2014   Procedure: Azzie Almas DILATION;  Surgeon: Daneil Dolin, MD;  Location: [facility];  Service: Endoscopy;  Laterality: N/A;    Current Medications: Current Meds  Medication Sig  . amLODipine (NORVASC) 10 MG tablet TAKE ONE TABLET BY MOUTH EVERY DAY PATIENT NEEDS OFFICE VISIT  . Ascorbic Acid (VITAMIN C) 1000 MG tablet Take 1,000 mg by mouth daily.  Marland Kitchen aspirin EC 81 MG tablet Take 1 tablet (81 mg total) by mouth daily.  . DiphenhydrAMINE HCl, Sleep, (SIMPLY SLEEP PO) Take 1-2 tablets by mouth at bedtime as needed (sleep).  . EDARBI 40 MG TABS TAKE ONE TABLET BY MOUTH DAILY.  Marland Kitchen  esomeprazole (NEXIUM) 40 MG capsule Take 1 capsule (40 mg total) by mouth 2 (two) times daily before a meal.  . glimepiride (AMARYL) 4 MG tablet Take 4 mg by mouth daily.   . isosorbide mononitrate (IMDUR) 60 MG 24 hr tablet TAKE ONE TABLET BY MOUTH EVERY DAY PATIENT NEEDS OFFICE VISIT  . Magnesium 500 MG TABS Take 1 tablet by mouth daily as needed (leg cramps).  . metFORMIN (GLUCOPHAGE) 500 MG tablet Take 500 mg by mouth daily with breakfast.   . nitroGLYCERIN (NITROSTAT) 0.4 MG SL tablet Place 1 tablet (0.4 mg total) under the tongue every 5 (five) minutes as needed for chest pain.  . ranitidine (ZANTAC) 150 MG tablet Take 300 mg by mouth 3 (three) times daily as needed for heartburn.   . simvastatin (ZOCOR) 40 MG tablet TAKE ONE TABLET BY MOUTH EVERY EVENING  . sitaGLIPtin (JANUVIA) 100 MG tablet Take 100 mg by mouth daily.     Allergies:   Codeine and Sulfa antibiotics   Social History   Social History  . Marital status: Single    Spouse name: N/A  . Number of children: N/A  . Years of education: N/A   Occupational History  . truck driver Product manager   Social History Main Topics  . Smoking status: Former Smoker    Quit date: 08/18/1995  . Smokeless tobacco: Never Used     Comment: Quit smoking x 20 years  . Alcohol use No  . Drug use: No  . Sexual activity: Not Currently   Other Topics Concern  . None   Social History Narrative   Divorced, no children, works as a Production designer, theatre/television/film.     Family History:  The patient's family history includes Diabetes type II in his sister; Heart attack in his cousin.   ROS:   Please see the history of present illness.    Review of Systems  Constitution: Negative.  HENT: Negative.   Cardiovascular: Positive for chest pain and dyspnea on exertion.  Respiratory: Negative.   Endocrine: Negative.   Hematologic/Lymphatic: Negative.   Musculoskeletal: Positive for joint pain.  Gastrointestinal: Positive for heartburn.    Genitourinary: Negative.   Neurological: Negative.    All other systems reviewed and are negative.   PHYSICAL EXAM:   VS:  BP 136/78   Pulse 84   Ht 6\' 4"  (1.93 m)   Wt (!) 317 lb (143.8 kg)   SpO2 96%   BMI 38.59 kg/m   Physical Exam  GEN: Obese, in no acute distress  Neck: no JVD, carotid bruits, or masses Cardiac:RRR; no murmurs, rubs, or gallops  Respiratory:  clear to auscultation bilaterally, normal work of breathing GI: soft, nontender, nondistended, + BS Ext: without cyanosis, clubbing, or edema, Good distal pulses bilaterally Neuro:  Alert and Oriented x 3 Psych: euthymic mood, full affect  Wt Readings from Last 3 Encounters:  02/26/17 (!) 317 lb (143.8 kg)  08/19/16 (!) 321 lb (145.6 kg)  07/25/16 (!) 320 lb (145.2  kg)      Studies/Labs Reviewed:   EKG:  EKG is ordered today.  The ekg ordered today demonstrates NSR nonspecific changes, no acute change  Recent Labs: 03/12/2016: ALT 19 08/19/2016: BUN 16; Creatinine, Ser 1.04; Hemoglobin 16.0; Platelets 236; Potassium 3.9; Sodium 138   Lipid Panel    Component Value Date/Time   CHOL 104 03/14/2016 1549   TRIG 291 (H) 03/14/2016 1549   HDL 26 (L) 03/14/2016 1549   CHOLHDL 4.0 03/14/2016 1549   VLDL 58 (H) 03/14/2016 1549   LDLCALC 20 03/14/2016 1549    Additional studies/ records that were reviewed today include:  Cardiac catheterization 08/18/11 IMPRESSION: 1. Probable acute coronary syndrome, leading to transient coronary     vasospasm, relieved with nitroglycerin. 2. Mild, currently nonobstructive coronary artery disease, with 20-30%     proximal left anterior descending stenoses, 40% mid left anterior     descending narrowing, 40% circumflex narrowing, and scattered 30%     proximal to mid right coronary artery narrowing.  The left system     narrowing were improved following IC nitroglycerin. 3. Normal left ventricular function with suggestion of left     ventricular hypertrophy without definitive  wall motion abnormality.   DISCUSSION:  Mr. Hudock is 51 year old gentleman, who has at least 90- year history of diabetes mellitus as well as a longstanding history of obesity.  Late last evening, he did have [employer] [place].  He has had difficulty with sleep.  At approximately 50 a.m. this morning, he awakened from a deep sleep with severe pressure in his chest, suggesting most likely sleep apnea induced during REM sleep with associated possible vasospasm in this setting of endothelial dysfunction contributed by his fatty meal intake prior to going to bed.  His chest pain completely resolved with nitroglycerin.  Initial ECG did show 2 mm ST-segment elevation in leads V1 and V2, with complete resolution following nitroglycerin.  Catheterizations reveals nonobstructive coronary artery disease, but with evidence for luminal irregularities as noted above.  He will be aggressively treated with medical therapy, including nitrates, lipid-lowering therapy, as well as started on aspirin.  The patient will require a sleep study to evaluate for high likelihood of obstructive sleep apnea.     Nuclear stress test 8/26/16There was no ST segment deviation noted during stress.  There is a small defect of mild severity present in the apical lateral location. The defect is partially reversible. Very small apical lateral defect that is most consistent with variable soft tissue attenuation rather than ischemia.  This is a low risk study.  Nuclear stress EF: 55%.      ASSESSMENT:    1. Coronary artery disease involving native coronary artery of native heart without angina pectoris   2. Precordial pain   3. Essential hypertension   4. Class 2 severe obesity due to excess calories with serious comorbidity and body mass index (BMI) of 38.0 to 38.9 in adult [facility])      PLAN:  In order of problems listed above:  CAD with history of MI and coronary spasm. Now having some chest pain that is  somewhat atypical and nonexertional. He is scheduled for a DOT physical and needs a stress test anyway so we'll order a Lexi scan Myoview. He had one 2 years ago that was normal. He sweats excessively and is requesting a Lexi scan rather than exercise Myoview. Refill nitroglycerin. No medicines patient changes today. Continue simvastatin, aspirin, amlodipine, edarbi  Essential hypertension controlled  Obesity  weight loss program recommended  Medication Adjustments/Labs and Tests Ordered: Current medicines are reviewed at length with the patient today.  Concerns regarding medicines are outlined above.  Medication changes, Labs and Tests ordered today are listed in the Patient Instructions below. There are no Patient Instructions on file for this visit.   Signed, Ermalinda Barrios, PA-C  02/26/2017 2:13 PM    [facility] [place], [place], [place]  [address] Phone: [phone]; Fax: [phone]

## 2017-02-26 NOTE — Patient Instructions (Signed)
Medication Instructions:  Your physician recommends that you continue on your current medications as directed. Please refer to the Current Medication list given to you today.   Labwork: NONE   Testing/Procedures: Your physician has requested that you have a lexiscan myoview. For further information please visit [email or web address]. Please follow instruction sheet, as given.    Follow-Up: Your physician wants you to follow-up in: 6 Months with Dr. Bronson Ing. You will receive a reminder letter in the mail two months in advance. If you don't receive a letter, please call our office to schedule the follow-up appointment.   Any Other Special Instructions Will Be Listed Below (If Applicable).     If you need a refill on your cardiac medications before your next appointment, please call your pharmacy. Thank you for choosing [facility]!

## 2017-03-05 ENCOUNTER — Encounter (HOSPITAL_BASED_OUTPATIENT_CLINIC_OR_DEPARTMENT_OTHER)
Admission: RE | Admit: 2017-03-05 | Discharge: 2017-03-05 | Disposition: A | Discharge status: Home / Self Care | Payer: No Typology Code available for payment source | Source: Ambulatory Visit | Attending: Physician Assistant | Admitting: Physician Assistant

## 2017-03-05 ENCOUNTER — Encounter (HOSPITAL_COMMUNITY)
Admission: RE | Admit: 2017-03-05 | Discharge: 2017-03-05 | Disposition: A | Discharge status: Home / Self Care | Payer: No Typology Code available for payment source | Source: Ambulatory Visit | Attending: Physician Assistant | Admitting: Physician Assistant

## 2017-03-05 ENCOUNTER — Encounter (HOSPITAL_COMMUNITY): Payer: Self-pay

## 2017-03-05 ENCOUNTER — Telehealth: Payer: Self-pay | Admitting: *Deleted

## 2017-03-05 DIAGNOSIS — I10 Essential (primary) hypertension: Secondary | ICD-10-CM | POA: Diagnosis not present

## 2017-03-05 DIAGNOSIS — I2510 Atherosclerotic heart disease of native coronary artery without angina pectoris: Secondary | ICD-10-CM | POA: Diagnosis present

## 2017-03-05 LAB — NM MYOCAR MULTI W/SPECT W/WALL MOTION / EF
CHL CUP NUCLEAR SRS: 0
CHL CUP NUCLEAR SSS: 2
CSEPPHR: 100 {beats}/min
LV dias vol: 107 mL (ref 62–150)
LV sys vol: 47 mL
RATE: 0.33
Rest HR: 69 {beats}/min
SDS: 2
TID: 1.37

## 2017-03-05 MED ORDER — SODIUM CHLORIDE 0.9% FLUSH
INTRAVENOUS | Status: AC
  Administered 2017-03-05: 10 mL via INTRAVENOUS
  Filled 2017-03-05: qty 10

## 2017-03-05 MED ORDER — REGADENOSON 0.4 MG/5ML IV SOLN
INTRAVENOUS | Status: AC
  Administered 2017-03-05: 0.4 mg via INTRAVENOUS
  Filled 2017-03-05: qty 5

## 2017-03-05 MED ORDER — TECHNETIUM TC 99M TETROFOSMIN IV KIT
10.0000 | PACK | Freq: Once | INTRAVENOUS | Status: AC | PRN
Start: 2017-03-05 — End: 2017-03-05
  Administered 2017-03-05: 10 via INTRAVENOUS

## 2017-03-05 MED ORDER — TECHNETIUM TC 99M TETROFOSMIN IV KIT
30.0000 | PACK | Freq: Once | INTRAVENOUS | Status: AC | PRN
  Administered 2017-03-05: 30 via INTRAVENOUS

## 2017-03-05 NOTE — Telephone Encounter (Signed)
Called patient with test results. No answer. Left message to call back.  

## 2017-03-05 NOTE — Telephone Encounter (Signed)
-----   Message from Imogene Burn, PA-C sent at 03/05/2017  2:59 PM EDT ----- Low risk stress test ejection fraction 56% no ischemia. 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

## 2017-03-05 NOTE — Telephone Encounter (Signed)
LM on private VM with this info

## 2017-03-05 NOTE — Telephone Encounter (Signed)
Pt walked into office for Edarbi coupons then says he has pain in neck and L arm - denies dizziness/SOB/swelling/chest pain - says he didn't mention this at stress test done today at [facility] [address] - says the neck pain started a week ago and now has spread to the L arm - pt says he was told that stress test "looked good" but didn't know who told him that - BP today 110/72 HR 78 - pt hasn't seen pcp regarding muscle pain but does have upcoming appt with Dr Woody Seller 10/31 - pt has taken ibuprofen that seem to relieve pain - explained to pt to try tylenol since hx of liver problems - pt agreeable - will forward to Dr Bronson Ing regarding stress test and pain

## 2017-03-05 NOTE — Telephone Encounter (Signed)
Will await results of stress test. However, neck pain with radiation into left arm is suggestive of a cervical radiculopathy (cervical nerve root impingement leading to arm pain/tingling/numbness). Would have him see PCP. He may need CT of the cervical spine or plain radiographs.

## 2017-03-07 ENCOUNTER — Emergency Department (HOSPITAL_COMMUNITY)
Admission: EM | Admit: 2017-03-07 | Discharge: 2017-03-07 | Disposition: A | Discharge status: Home / Self Care | Payer: No Typology Code available for payment source | Attending: Emergency Medicine | Admitting: Emergency Medicine

## 2017-03-07 ENCOUNTER — Encounter (HOSPITAL_COMMUNITY): Payer: Self-pay | Admitting: Emergency Medicine

## 2017-03-07 ENCOUNTER — Emergency Department (HOSPITAL_COMMUNITY): Payer: No Typology Code available for payment source

## 2017-03-07 DIAGNOSIS — G8929 Other chronic pain: Secondary | ICD-10-CM | POA: Diagnosis not present

## 2017-03-07 DIAGNOSIS — Z7982 Long term (current) use of aspirin: Secondary | ICD-10-CM | POA: Insufficient documentation

## 2017-03-07 DIAGNOSIS — M25512 Pain in left shoulder: Secondary | ICD-10-CM | POA: Diagnosis present

## 2017-03-07 DIAGNOSIS — I10 Essential (primary) hypertension: Secondary | ICD-10-CM | POA: Diagnosis not present

## 2017-03-07 DIAGNOSIS — I2510 Atherosclerotic heart disease of native coronary artery without angina pectoris: Secondary | ICD-10-CM | POA: Insufficient documentation

## 2017-03-07 DIAGNOSIS — Z87891 Personal history of nicotine dependence: Secondary | ICD-10-CM | POA: Diagnosis not present

## 2017-03-07 DIAGNOSIS — Z7984 Long term (current) use of oral hypoglycemic drugs: Secondary | ICD-10-CM | POA: Diagnosis not present

## 2017-03-07 DIAGNOSIS — Z79899 Other long term (current) drug therapy: Secondary | ICD-10-CM | POA: Diagnosis not present

## 2017-03-07 DIAGNOSIS — E119 Type 2 diabetes mellitus without complications: Secondary | ICD-10-CM | POA: Insufficient documentation

## 2017-03-07 DIAGNOSIS — M5412 Radiculopathy, cervical region: Secondary | ICD-10-CM | POA: Insufficient documentation

## 2017-03-07 MED ORDER — KETOROLAC TROMETHAMINE 60 MG/2ML IM SOLN
60.0000 mg | Freq: Once | INTRAMUSCULAR | Status: AC
  Administered 2017-03-07: 60 mg via INTRAMUSCULAR
  Filled 2017-03-07: qty 2

## 2017-03-07 MED ORDER — METHOCARBAMOL 500 MG PO TABS
1000.0000 mg | ORAL_TABLET | Freq: Once | ORAL | Status: AC
  Administered 2017-03-07: 1000 mg via ORAL
  Filled 2017-03-07: qty 2

## 2017-03-07 MED ORDER — HYDROCODONE-ACETAMINOPHEN 5-325 MG PO TABS: 1.0000 | ORAL_TABLET | ORAL | 0 refills | Status: DC | PRN

## 2017-03-07 MED ORDER — METHOCARBAMOL 500 MG PO TABS: 1000.0000 mg | ORAL_TABLET | Freq: Three times a day (TID) | ORAL | 0 refills | Status: DC | PRN

## 2017-03-07 NOTE — ED Triage Notes (Signed)
Pt c/o left side neck/shoulder and arm pain x 1 month. Pt states he has had cardiology work up and stress test last week.

## 2017-03-07 NOTE — ED Provider Notes (Signed)
[facility] DEPT Provider Note   CSN: [id number] Arrival date & time: 03/07/17  5852     History   Chief Complaint Chief Complaint  Patient presents with  . Shoulder Pain    HPI Jerome Shepherd is a 51 y.o. male.  HPI Patient presents with roughly one month of posterior neck pain and left shoulder pain and now numbness to the left hand. Denies any focal weakness. Denies any known head or neck trauma. No new swelling or redness. Denies any fever or chills. No chest pain or shortness of breath. Denies headache, visual changes or speech changes. Past Medical History:  Diagnosis Date  . 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 CAD (coronary artery disease), minimal, non obstructive 2013 01/01/2013  . Complication of anesthesia   . Constipation   . 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 Diabetes mellitus   . GERD (gastroesophageal reflux disease)   . Hyperlipidemia   . Hypertension   . Myocardial infarction ([place])    2012  . PONV (postoperative nausea and vomiting)     Patient Active Problem List   Diagnosis Date Noted  . NASH (nonalcoholic steatohepatitis) 04/24/2016  . Encounter for screening colonoscopy 04/24/2016  . Herpes zoster 03/17/2016  . ESBL (extended spectrum beta-lactamase) producing bacteria infection   . Acute pyelonephritis   . Chronic pain syndrome   . Uncontrolled type 2 diabetes mellitus with complication ([place])   . Acute renal failure ([place])   . AKI (acute kidney injury) ([place])   . Pyelonephritis 03/11/2016  . ARF (acute renal failure) ([place]) 03/11/2016  . 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 Esophageal dysphagia, with solids only, leading to chest pain 01/22/2013  . HTN (hypertension) 01/22/2013  . 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 Chest pain 01/01/2013  . CAD (coronary artery disease), minimal, non obstructive 2013 01/01/2013  . GERD (gastroesophageal reflux disease) 01/01/2013  . Abnormal EKG, ?st elevation  reported at OSH 08/18/2011  . Diabetes mellitus type 2, controlled ([place]) 08/18/2011  . Obesity 08/18/2011    Past Surgical History:  Procedure Laterality Date  . CARDIAC CATHETERIZATION  2013   non obstructive CAD  ~20%  . CHOLECYSTECTOMY N/A 04/01/2014   Procedure: LAPAROSCOPIC CHOLECYSTECTOMY;  Surgeon: Jamesetta So, MD;  Location: [facility] ORS;  Service: General;  Laterality: N/A;  . COLONOSCOPY WITH PROPOFOL N/A 08/22/2016   Procedure: COLONOSCOPY WITH PROPOFOL;  Surgeon: Daneil Dolin, MD;  Location: [facility] ENDO SUITE;  Service: Endoscopy;  Laterality: N/A;  730   . ESOPHAGOGASTRODUODENOSCOPY N/A 01/10/2014   Dr. Gala Romney: antral erosions, path with chronic inactive gastritis. Empiric dilation with 56-F dilation  . ESOPHAGOGASTRODUODENOSCOPY (EGD) WITH PROPOFOL N/A 02/16/2016   Dr. Gala Romney: small hiatal hernia, normal esopagus s/p empiric dilation  . KNEE ARTHROSCOPY Right 2002  . LEFT HEART CATH N/A 08/18/2011   Procedure: LEFT HEART CATH;  Surgeon: Lorretta Harp, MD;  Location: [facility] CATH LAB;  Service: Cardiovascular;  Laterality: N/A;  . LIVER BIOPSY N/A 04/01/2014   mild fibrosis  . MALONEY DILATION N/A 01/10/2014   Procedure: Venia Minks DILATION;  Surgeon: Daneil Dolin, MD;  Location: [facility] END[facility] SU[facility];  Service: Endoscopy;  Laterality: N/A;  Venia Minks DILATION N/A 02/16/2016   Procedure: Venia Minks DILATION;  Surgeon: Daneil Dolin, MD;  Location: [facility];  Service: Endoscopy;  Laterality: N/A;  . POLYPECTOMY  08/22/2016   Procedure: POLYPECTOMY;  Surgeon: Daneil Dolin, MD;  Location: [facility]  END[facility] SU[facility];  Service: Endoscopy;;  . SAVORY DILATION N/A 01/10/2014   Procedure: SAVORY DILATION;  Surgeon: Daneil Dolin, MD;  Location: [facility] END[facility];  Service: Endoscopy;  Laterality: N/A;       Home Medications    Prior to Admission medications   Medication Sig Start Date End Date Taking? Authorizing Provider  amLODipine (NORVASC) 10 MG tablet TAKE ONE TABLET BY MOUTH EVERY DAY PATIENT NEEDS OFFICE  VISIT 01/15/17   Herminio Commons, MD  Ascorbic Acid (VITAMIN C) 1000 MG tablet Take 1,000 mg by mouth daily.    [provider]  aspirin EC 81 MG tablet Take 1 tablet (81 mg total) by mouth daily. 11/23/15   Herminio Commons, MD  DiphenhydrAMINE HCl, Sleep, (SIMPLY SLEEP PO) Take 1-2 tablets by mouth at bedtime as needed (sleep).    [provider]  EDARBI 40 MG TABS TAKE ONE TABLET BY MOUTH DAILY. 04/08/16   Herminio Commons, MD  esomeprazole (NEXIUM) 40 MG capsule Take 1 capsule (40 mg total) by mouth 2 (two) times daily before a meal. 12/20/16   Annitta Needs, NP  glimepiride (AMARYL) 4 MG tablet Take 4 mg by mouth daily.  12/20/15   [provider]  HYDROcodone-acetaminophen (NORCO) 5-325 MG tablet Take 1-2 tablets by mouth every 4 (four) hours as needed for severe pain. 03/07/17   Julianne Rice, MD  isosorbide mononitrate (IMDUR) 60 MG 24 hr tablet TAKE ONE TABLET BY MOUTH EVERY DAY PATIENT NEEDS OFFICE VISIT 01/15/17   Herminio Commons, MD  Magnesium 500 MG TABS Take 1 tablet by mouth daily as needed (leg cramps).    [provider]  metFORMIN (GLUCOPHAGE) 500 MG tablet Take 500 mg by mouth daily with breakfast.     [provider]  methocarbamol (ROBAXIN) 500 MG tablet Take 2 tablets (1,000 mg total) by mouth every 8 (eight) hours as needed for muscle spasms. 03/07/17   Julianne Rice, MD  nitroGLYCERIN (NITROSTAT) 0.4 MG SL tablet Place 1 tablet (0.4 mg total) under the tongue every 5 (five) minutes as needed for chest pain. 07/18/15   Herminio Commons, MD  ranitidine (ZANTAC) 150 MG tablet Take 300 mg by mouth 3 (three) times daily as needed for heartburn.     [provider]  simvastatin (ZOCOR) 40 MG tablet TAKE ONE TABLET BY MOUTH EVERY EVENING 01/15/17   Herminio Commons, MD  sitaGLIPtin (JANUVIA) 100 MG tablet Take 100 mg by mouth daily.    [provider]    Family History Family History  Problem  Relation Age of Onset  . Diabetes type II Sister   . Heart attack Cousin   . Colon cancer Neg Hx   . Stomach cancer Neg Hx     Social History Social History  Substance Use Topics  . Smoking status: Former Smoker    Quit date: 08/18/1995  . Smokeless tobacco: Never Used     Comment: Quit smoking x 20 years  . Alcohol use No     Allergies   Codeine and Sulfa antibiotics   Review of Systems Review of Systems  Constitutional: Negative for chills and fever.  Eyes: Negative for visual disturbance.  Respiratory: Negative for cough and shortness of breath.   Cardiovascular: Negative for chest pain, palpitations and leg swelling.  Gastrointestinal: Negative for abdominal pain, nausea and vomiting.  Musculoskeletal: Positive for myalgias and neck pain. Negative for arthralgias.  Skin: Negative for rash and wound.  Neurological:  Positive for numbness. Negative for dizziness, syncope, weakness, light-headedness and headaches.  All other systems reviewed and are negative.    Physical Exam Updated Vital Signs BP 131/84 (BP Location: Right Arm)   Pulse 86   Temp 98.7 F (37.1 C) (Oral)   Resp 18   Ht 6\' 4"  (1.93 m)   Wt (!) 142.9 kg (315 lb)   SpO2 96%   BMI 38.34 kg/m   Physical Exam  Constitutional: He is oriented to person, place, and time. He appears well-developed and well-nourished. No distress.  HENT:  Head: Normocephalic and atraumatic.  Mouth/Throat: Oropharynx is clear and moist. No oropharyngeal exudate.  Eyes: Pupils are equal, round, and reactive to light. EOM are normal.  Neck: Normal range of motion. Neck supple.  No posterior midline cervical tenderness to palpation.  Cardiovascular: Normal rate and regular rhythm.  Exam reveals no gallop and no friction rub.   No murmur heard. Pulmonary/Chest: Effort normal and breath sounds normal.  Abdominal: Soft. Bowel sounds are normal. There is no tenderness. There is no rebound and no guarding.  Musculoskeletal:  Normal range of motion. He exhibits no edema or tenderness.  Distal pulses are 2+. No effusions, deformity, warmth to bilateral wrists, elbows, shoulders.  Lymphadenopathy:    He has no cervical adenopathy.  Neurological: He is alert and oriented to person, place, and time.  Skin: Skin is warm and dry. No rash noted. No erythema.  Psychiatric: He has a normal mood and affect. His behavior is normal.  Nursing note and vitals reviewed.    ED Treatments / Results  Labs (all labs ordered are listed, but only abnormal results are displayed) Labs Reviewed - No data to display  EKG  EKG Interpretation None       Radiology Mr Cervical Spine Wo Contrast  Result Date: 03/07/2017 CLINICAL DATA:  Left-sided neck, shoulder, and arm pain. EXAM: MRI CERVICAL SPINE WITHOUT CONTRAST TECHNIQUE: Multiplanar, multisequence MR imaging of the cervical spine was performed. No intravenous contrast was administered. COMPARISON:  None. FINDINGS: Alignment: Normal Vertebrae: No fracture, evidence of discitis, or bone lesion. Cord: No cord signal abnormality. Mild degenerative flattening of the left cord. Posterior Fossa, vertebral arteries, paraspinal tissues: Negative Disc levels: C2-3: Unremarkable. C3-4: Small left facet spur.  No impingement C4-5: No herniation or impingement. C5-6: Shallow central disc protrusion.  No impingement C6-7: Mild disc narrowing with bulging and uncovertebral spurring. There is a left paracentral to foraminal disc protrusion with left ventral cord flattening and left C7 foraminal impingement. Mild to moderate right foraminal narrowing from disc height loss and spur. C7-T1:Unremarkable. IMPRESSION: 1. Symptomatic finding is presumably C6-7 left paracentral to foraminal disc protrusion with left C7 impingement and mild left cord flattening. Mild to moderate right foraminal narrowing at the same level due to uncovertebral spurring. 2. Mild degenerative changes elsewhere without  impingement. Electronically Signed   By: Monte Fantasia M.D.   On: 03/07/2017 11:06    Procedures Procedures (including critical care time)  Medications Ordered in ED Medications  ketorolac (TORADOL) injection 60 mg (60 mg Intramuscular Given 03/07/17 1148)  methocarbamol (ROBAXIN) tablet 1,000 mg (1,000 mg Oral Given 03/07/17 1149)     Initial Impression / Assessment and Plan / ED Course  I have reviewed the triage vital signs and the nursing notes.  Pertinent labs & imaging results that were available during my care of the patient were reviewed by me and considered in my medical decision making (see chart for details).  MRI with evidence of nerve impingement of C6/7 consistent with the patient's symptoms. Will treat symptomatically and have follow-up with spinal surgery. Return precautions given.  Final Clinical Impressions(s) / ED Diagnoses   Final diagnoses:  Cervical radiculopathy    New Prescriptions Discharge Medication List as of 03/07/2017 11:21 AM    START taking these medications   Details  HYDROcodone-acetaminophen (NORCO) 5-325 MG tablet Take 1-2 tablets by mouth every 4 (four) hours as needed for severe pain., Starting Fri 03/07/2017, Print    methocarbamol (ROBAXIN) 500 MG tablet Take 2 tablets (1,000 mg total) by mouth every 8 (eight) hours as needed for muscle spasms., Starting Fri 03/07/2017, Print         Julianne Rice, MD 03/07/17 [phone]

## 2017-03-07 NOTE — ED Notes (Signed)
Pt returned from MRI °

## 2017-03-14 ENCOUNTER — Ambulatory Visit: Payer: No Typology Code available for payment source | Admitting: Cardiovascular Disease

## 2017-03-17 ENCOUNTER — Other Ambulatory Visit: Payer: Self-pay | Admitting: Cardiovascular Disease

## 2017-07-18 ENCOUNTER — Other Ambulatory Visit: Payer: Self-pay | Admitting: Cardiovascular Disease

## 2017-07-26 ENCOUNTER — Other Ambulatory Visit: Payer: Self-pay | Admitting: Gastroenterology

## 2017-08-29 ENCOUNTER — Ambulatory Visit (INDEPENDENT_AMBULATORY_CARE_PROVIDER_SITE_OTHER): Payer: Commercial Managed Care - PPO | Admitting: Cardiovascular Disease

## 2017-08-29 ENCOUNTER — Encounter: Payer: Self-pay | Admitting: Cardiovascular Disease

## 2017-08-29 VITALS — BP 130/62 | HR 77 | Ht 76.0 in | Wt 317.0 lb

## 2017-08-29 DIAGNOSIS — G4730 Sleep apnea, unspecified: Secondary | ICD-10-CM | POA: Diagnosis not present

## 2017-08-29 DIAGNOSIS — E7800 Pure hypercholesterolemia, unspecified: Secondary | ICD-10-CM | POA: Diagnosis not present

## 2017-08-29 DIAGNOSIS — I201 Angina pectoris with documented spasm: Secondary | ICD-10-CM

## 2017-08-29 DIAGNOSIS — I2583 Coronary atherosclerosis due to lipid rich plaque: Secondary | ICD-10-CM

## 2017-08-29 DIAGNOSIS — I10 Essential (primary) hypertension: Secondary | ICD-10-CM

## 2017-08-29 DIAGNOSIS — I2510 Atherosclerotic heart disease of native coronary artery without angina pectoris: Secondary | ICD-10-CM | POA: Diagnosis not present

## 2017-08-29 NOTE — Progress Notes (Signed)
SUBJECTIVE: The patient presents for routine follow-up.  He has a history of nonobstructive coronary artery disease with coronary vasospasm.  He underwent a normal nuclear stress test on 03/05/17, LVEF 56%.  He underwent MR of the cervical spine on 03/07/17 which showed C6-7 left paracentral to foraminal disc protrusion with left C7 impingement and mild left cord flattening.  There was mild to moderate right foraminal narrowing at the same level due to spurring.  He saw a chiropractor for his neck and said symptoms are tolerable.  He very seldom has chest tightness.  He said he wants to lose weight and is seeing a nutritionist.  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  He also had several questions about erectile function.    Review of Systems: As per "subjective", otherwise negative.  Allergies  Allergen Reactions  . Codeine Nausea Only  . Sulfa Antibiotics Nausea Only    Current Outpatient Medications  Medication Sig Dispense Refill  . amLODipine (NORVASC) 10 MG tablet TAKE ONE TABLET BY MOUTH EVERY DAY PATIENT NEEDS OFFICE VISIT 7 tablet 0  . Ascorbic Acid (VITAMIN C) 1000 MG tablet Take 1,000 mg by mouth daily.    Marland Kitchen aspirin EC 81 MG tablet Take 1 tablet (81 mg total) by mouth daily.    . DiphenhydrAMINE HCl, Sleep, (SIMPLY SLEEP PO) Take 1-2 tablets by mouth at bedtime as needed (sleep).    . EDARBI 40 MG TABS TAKE ONE TABLET BY MOUTH DAILY. 30 tablet 3  . esomeprazole (NEXIUM) 40 MG capsule TAKE ONE CAPSULE BY MOUTH TWICE DAILY BEFORE a meal 60 capsule 5  . glimepiride (AMARYL) 4 MG tablet Take 4 mg by mouth daily.   3  . HYDROcodone-acetaminophen (NORCO) 5-325 MG tablet Take 1-2 tablets by mouth every 4 (four) hours as needed for severe pain. 15 tablet 0  . isosorbide mononitrate (IMDUR) 60 MG 24 hr tablet TAKE ONE TABLET BY MOUTH EVERY DAY PATIENT NEEDS OFFICE VISIT 7 tablet 0  . Magnesium 500 MG TABS Take 1 tablet by mouth  daily as needed (leg cramps).    . metFORMIN (GLUCOPHAGE) 500 MG tablet Take 500 mg by mouth daily with breakfast.     . nitroGLYCERIN (NITROSTAT) 0.4 MG SL tablet Place 1 tablet (0.4 mg total) under the tongue every 5 (five) minutes as needed for chest pain. 25 tablet 3  . simvastatin (ZOCOR) 40 MG tablet TAKE ONE TABLET BY MOUTH EVERY EVENING 7 tablet 0   No current facility-administered medications for this visit.     Past Medical History:  Diagnosis Date  . 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 CAD (coronary artery disease), minimal, non obstructive 2013 01/01/2013  . Complication of anesthesia   . Constipation   . 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 Diabetes mellitus   . GERD (gastroesophageal reflux disease)   . Hyperlipidemia   . Hypertension   . Myocardial infarction ([place])    2012  . PONV (postoperative nausea and vomiting)     Past Surgical History:  Procedure Laterality Date  . CARDIAC CATHETERIZATION  2013   non obstructive CAD  ~20%  . CHOLECYSTECTOMY N/A 04/01/2014   Procedure: LAPAROSCOPIC CHOLECYSTECTOMY;  Surgeon: Jamesetta So, MD;  Location: [facility] ORS;  Service: General;  Laterality: N/A;  . COLONOSCOPY WITH PROPOFOL N/A 08/22/2016   Procedure:  COLONOSCOPY WITH PROPOFOL;  Surgeon: Daneil Dolin, MD;  Location: [facility];  Service: Endoscopy;  Laterality: N/A;  730   . ESOPHAGOGASTRODUODENOSCOPY N/A 01/10/2014   Dr. Gala Romney: antral erosions, path with chronic inactive gastritis. Empiric dilation with 56-F dilation  . ESOPHAGOGASTRODUODENOSCOPY (EGD) WITH PROPOFOL N/A 02/16/2016   Dr. Gala Romney: small hiatal hernia, normal esopagus s/p empiric dilation  . KNEE ARTHROSCOPY Right 2002  . LEFT HEART CATH N/A 08/18/2011   Procedure: LEFT HEART CATH;  Surgeon: Lorretta Harp, MD;  Location: [facility] CATH LAB;  Service: Cardiovascular;  Laterality: N/A;  . LIVER BIOPSY N/A 04/01/2014   mild fibrosis  . MALONEY DILATION N/A 01/10/2014     Procedure: Venia Minks DILATION;  Surgeon: Daneil Dolin, MD;  Location: [facility];  Service: Endoscopy;  Laterality: N/A;  Venia Minks DILATION N/A 02/16/2016   Procedure: Venia Minks DILATION;  Surgeon: Daneil Dolin, MD;  Location: [facility];  Service: Endoscopy;  Laterality: N/A;  . POLYPECTOMY  08/22/2016   Procedure: POLYPECTOMY;  Surgeon: Daneil Dolin, MD;  Location: [facility];  Service: Endoscopy;;  . Azzie Almas DILATION N/A 01/10/2014   Procedure: Azzie Almas DILATION;  Surgeon: Daneil Dolin, MD;  [facility]: [facility];  Service: Endoscopy;  Laterality: N/A;    Social History   Socioeconomic History  . Marital status: Single    Spouse name: Not on file  . Number of children: Not on file  . Years of education: Not on file  . Highest education level: Not on file  Social Needs  . Financial resource strain: Not on file  . Food insecurity - worry: Not on file  . Food insecurity - inability: Not on file  . Transportation needs - medical: Not on file  . Transportation needs - non-medical: Not on file  Occupational History  . Occupation: truck Education administrator: [employer]  Tobacco Use  . Smoking status: Former Smoker    Last attempt to quit: 08/18/1995    Years since quitting: 22.0  . Smokeless tobacco: Never Used  . Tobacco comment: Quit smoking x 20 years  Substance and Sexual Activity  . Alcohol use: No    Alcohol/week: 0.0 oz  . Drug use: No  . Sexual activity: Not Currently  Other Topics Concern  . Not on file  Social History Narrative   Divorced, no children, works as a Production designer, theatre/television/film.     Vitals:   08/29/17 1557  BP: 130/62  Pulse: 77  SpO2: 98%  Weight: (!) 317 lb (143.8 kg)  Height: 6' 4"  (1.93 m)    Wt Readings from Last 3 Encounters:  08/29/17 (!) 317 lb (143.8 kg)  03/07/17 (!) 315 lb (142.9 kg)  02/26/17 (!) 317 lb (143.8 kg)     PHYSICAL EXAM General: NAD HEENT: Normal. Neck: No JVD, no thyromegaly. Lungs: Clear to  auscultation bilaterally with normal respiratory effort. CV: Regular rate and rhythm, normal S1/S2, no S3/S4, no murmur. No pretibial or periankle edema.  No carotid bruit.   Abdomen: Soft, nontender, no distention.  Neurologic: Alert and oriented.  Psych: Normal affect. Skin: Normal. Musculoskeletal: No gross deformities.    ECG: Most recent ECG reviewed.   Labs: Lab Results  Component Value Date/Time   K 3.9 08/19/2016 01:38 PM   BUN 16 08/19/2016 01:38 PM   CREATININE 1.04 08/19/2016 01:38 PM   ALT 19 03/12/2016 04:03 AM   TSH 3.910 01/16/2014 09:44 PM   HGB 16.0  08/19/2016 01:38 PM     Lipids: Lab Results  Component Value Date/Time   LDLCALC 20 03/14/2016 03:49 PM   CHOL 104 03/14/2016 03:49 PM   TRIG 291 (H) 03/14/2016 03:49 PM   HDL 26 (L) 03/14/2016 03:49 PM       ASSESSMENT AND PLAN: 1.  Nonobstructive coronary disease: Symptomatically stable.  Normal nuclear stress test in September 2018 as noted above.  Continue aspirin, amlodipine, simvastatin, and long-acting nitrates.  2.  Hypertension: Controlled on present therapy.  No changes.    3.  Hyperlipidemia: Continue statin therapy.  4.  Sleep apnea: Using mouth spaces.  5.  Erectile function: He asked me questions about Viagra.  I told him to avoid taking long-acting nitrates on days he uses this.    Disposition: Follow up 1 year   Kate Sable, M.D., F.A.C.C.

## 2017-08-29 NOTE — Patient Instructions (Signed)

## 2017-09-22 ENCOUNTER — Telehealth: Payer: Self-pay | Admitting: *Deleted

## 2017-09-22 NOTE — Telephone Encounter (Signed)
Received fax from [facility] approved through 09/23/2018.

## 2017-09-23 ENCOUNTER — Telehealth: Payer: Self-pay | Admitting: Cardiovascular Disease

## 2017-09-23 NOTE — Telephone Encounter (Signed)
Patient walk in  [employer] does not want to pay for   EDARBI 40 MG TABS  Patient has not taken medication in 4 days due to not being able to get.

## 2017-09-23 NOTE — Telephone Encounter (Signed)
Per 4/1 phone note - PA approved - pt made aware and will check again with pharmacy

## 2017-10-25 ENCOUNTER — Other Ambulatory Visit: Payer: Self-pay | Admitting: Cardiovascular Disease

## 2018-02-02 ENCOUNTER — Telehealth: Payer: Self-pay | Admitting: Internal Medicine

## 2018-02-02 NOTE — Telephone Encounter (Signed)
Lmom, waiting on a return call.  

## 2018-02-02 NOTE — Telephone Encounter (Signed)
Pt returned call and wants to discuss hernia removal and reflux. Pt was scheduled with EG on 02/06/18 @ 10:30.

## 2018-02-02 NOTE — Telephone Encounter (Signed)
Pt is aware of his OV in NOV and is aware he is on a cancellation list. Pt wants to speak with nurse about his reflux. Please call [phone]

## 2018-02-06 ENCOUNTER — Ambulatory Visit (INDEPENDENT_AMBULATORY_CARE_PROVIDER_SITE_OTHER): Payer: Commercial Managed Care - PPO | Admitting: Nurse Practitioner

## 2018-02-06 ENCOUNTER — Other Ambulatory Visit: Payer: Self-pay

## 2018-02-06 ENCOUNTER — Encounter: Payer: Self-pay | Admitting: Nurse Practitioner

## 2018-02-06 VITALS — BP 135/78 | HR 86 | Temp 97.3°F | Ht 75.0 in | Wt 311.6 lb

## 2018-02-06 DIAGNOSIS — R1319 Other dysphagia: Secondary | ICD-10-CM

## 2018-02-06 DIAGNOSIS — R1310 Dysphagia, unspecified: Secondary | ICD-10-CM

## 2018-02-06 DIAGNOSIS — R110 Nausea: Secondary | ICD-10-CM

## 2018-02-06 DIAGNOSIS — K219 Gastro-esophageal reflux disease without esophagitis: Secondary | ICD-10-CM | POA: Diagnosis not present

## 2018-02-06 NOTE — Progress Notes (Signed)
Referring Provider: Glenda Chroman, MD Primary Care Physician:  Glenda Chroman, MD Primary GI:  Dr. Gala Romney  Chief Complaint  Patient presents with  . Gastroesophageal Reflux  . Hiatal Hernia  . Nausea    HPI:   Jerome Shepherd is a 52 y.o. male who presents for evaluation of reflux and hernia.  Patient was last seen in our office 07/25/2016 for [facility], screening colonoscopy, GERD.  Noted history of [name] with grade 1 fibrosis on liver biopsy 2015.  EGD August 2017 with normal esophagus status post dilation, small hiatal hernia.  He did have complaints with difficulty with liquids "stopping" in the upper esophagus like an air pocket blocking the passage and he was offered a BPE to evaluate for possible Zenker's diverticulum but he declined.  At his last visit he noted some epigastric pain radiating into the center of his chest and both shoulders.  Cardiology has been seen.  Noted recent stress related to the death of his mother and sister age 5 of sudden MI.  No suspected cardiac issues based on work-up.  Patient also complained of typical heartburn and takes pantoprazole 40 mg daily but may take an additional 6-8 over-the-counter Zantac daily.  He will have loose explosive stools when he is having a flare of his heartburn.  Normal/solid stools when no upper GI symptoms present.  He is wondering if he should have his hiatal hernia repaired.  After his last visit recommended switch from pantoprazole to Dexilant and cut back on Zantac as well as a drug holiday due to possible tachycardia from Zantac.  First ever colonoscopy on deep sedation.  Recommended 65-monthfollow-up with Dr. RGala Romney  Requested progress report in 2 weeks related to upper GI symptoms.  He called the next day advising that he tried Dexilant previously and could not take it.  A prescription for Nexium 40 mg twice daily was sent.  Colonoscopy completed 08/22/2016 which found a single 3 mm polyp in the rectum, otherwise normal.  Surgical  pathology found the polyp to be hyperplastic.  Recommended repeat colonoscopy in 10 years.  Also recommended six-month follow-up in office.  The patient did not provide further progress reports.  He did not follow-up as recommended.  Today he states he's having epigastric nausea. Knows he has a hiatal hernia. Has hurting in his jaws, dizziness. Vision has gotten blurry since his epigastric pain started. Intermittent epigastric pain. Epigastric pain isn't really "pain" but more discomfort, occurs 3-4 times a day, lasts up to all day. Only known triggers are steak (if he doesn't chew well) and will get hung and not go down. If he eats too fast and doesn't chew very well will have solid food dysphagia, no pill dysphagia. Food dysphagia 2-3 times a month, no regurgitation, has to drink a lot to get it to go down. Denies other abdominal pain, hematochezia, melena, fever, chills, unintentional weight loss. Has acid regurgitation (reflux) which happens somewhat regularly, dependent on oral intake although he cannot name specific triggers. Nexium has been working well until his new onset of symptoms. Denies chest pain, dyspnea, dizziness, lightheadedness, syncope, near syncope. Denies any other upper or lower GI symptoms.  [facility], [facility]  It appears they discharged him on Reglan and Carafate.  Reviewed labs including CBC, CMP which were essentially normal.  Past Medical History:  Diagnosis Date  . Antral erosion 01/17/2014   Per  EGD 01/10/14- not likely the cause of his symtoms   . CAD (coronary artery disease), minimal, non obstructive 2013 01/01/2013  . Complication of anesthesia   . Constipation   . 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 Diabetes mellitus   . GERD (gastroesophageal reflux disease)   . Hyperlipidemia   . Hypertension   . Myocardial infarction ([place])    2012  . PONV (postoperative  nausea and vomiting)     Past Surgical History:  Procedure Laterality Date  . CARDIAC CATHETERIZATION  2013   non obstructive CAD  ~20%  . CHOLECYSTECTOMY N/A 04/01/2014   Procedure: LAPAROSCOPIC CHOLECYSTECTOMY;  Surgeon: Jamesetta So, MD;  Location: [facility];  Service: General;  Laterality: N/A;  . COLONOSCOPY WITH PROPOFOL N/A 08/22/2016   Procedure: COLONOSCOPY WITH PROPOFOL;  Surgeon: Daneil Dolin, MD;  Location: [facility];  Service: Endoscopy;  Laterality: N/A;  730   . ESOPHAGOGASTRODUODENOSCOPY N/A 01/10/2014   Dr. Gala Romney: antral erosions, path with chronic inactive gastritis. Empiric dilation with 56-F dilation  . ESOPHAGOGASTRODUODENOSCOPY (EGD) WITH PROPOFOL N/A 02/16/2016   Dr. Gala Romney: small hiatal hernia, normal esopagus s/p empiric dilation  . KNEE ARTHROSCOPY Right 2002  . LEFT HEART CATH N/A 08/18/2011   Procedure: LEFT HEART CATH;  Surgeon: Lorretta Harp, MD;  Location: [facility] CATH LAB;  Service: Cardiovascular;  Laterality: N/A;  . LIVER BIOPSY N/A 04/01/2014   mild fibrosis  . MALONEY DILATION N/A 01/10/2014   Procedure: Venia Minks DILATION;  Surgeon: Daneil Dolin, MD;  Location: [facility];  Service: Endoscopy;  Laterality: N/A;  Venia Minks DILATION N/A 02/16/2016   Procedure: Venia Minks DILATION;  Surgeon: Daneil Dolin, MD;  Location: [facility];  Service: Endoscopy;  Laterality: N/A;  . POLYPECTOMY  08/22/2016   Procedure: POLYPECTOMY;  Surgeon: Daneil Dolin, MD;  Location: [facility];  Service: Endoscopy;;  . Azzie Almas DILATION N/A 01/10/2014   Procedure: Azzie Almas DILATION;  Surgeon: Daneil Dolin, MD;  Location: [facility];  Service: Endoscopy;  Laterality: N/A;    Current Outpatient Medications  Medication Sig Dispense Refill  . amLODipine (NORVASC) 10 MG tablet TAKE ONE TABLET BY MOUTH EVERY DAY PATIENT NEEDS OFFICE VISIT 7 tablet 0  . Ascorbic Acid (VITAMIN C) 1000 MG tablet Take 1,000 mg by mouth as needed. Takes during the winter    . aspirin EC 81 MG  tablet Take 1 tablet (81 mg total) by mouth daily.    Marland Kitchen EDARBI 40 MG TABS TAKE ONE TABLET BY MOUTH DAILY. 30 tablet 6  . esomeprazole (NEXIUM) 40 MG capsule TAKE ONE CAPSULE BY MOUTH TWICE DAILY BEFORE a meal 60 capsule 5  . glimepiride (AMARYL) 4 MG tablet Take 4 mg by mouth daily.   3  . isosorbide mononitrate (IMDUR) 60 MG 24 hr tablet TAKE ONE TABLET BY MOUTH EVERY DAY PATIENT NEEDS OFFICE VISIT 7 tablet 0  . Magnesium 500 MG TABS Take 1 tablet by mouth daily as needed (leg cramps).    . meclizine (ANTIVERT) 25 MG tablet Take 1 tablet by mouth as needed.  0  . metFORMIN (GLUCOPHAGE) 500 MG tablet Take 500 mg by mouth daily with breakfast.     . metoCLOPramide (REGLAN) 10 MG tablet Take 1 tablet by mouth as directed.  0  . nitroGLYCERIN (NITROSTAT) 0.4 MG SL tablet Place 1 tablet (0.4 mg total) under the tongue every 5 (five) minutes as needed for chest pain. 25 tablet 3  . simvastatin (  ZOCOR) 40 MG tablet TAKE ONE TABLET BY MOUTH EVERY EVENING 7 tablet 0  . DiphenhydrAMINE HCl, Sleep, (SIMPLY SLEEP PO) Take 1-2 tablets by mouth at bedtime as needed (sleep).    Marland Kitchen HYDROcodone-acetaminophen (NORCO) 5-325 MG tablet Take 1-2 tablets by mouth every 4 (four) hours as needed for severe pain. (Patient not taking: Reported on 02/06/2018) 15 tablet 0   No current facility-administered medications for this visit.     Allergies as of 02/06/2018 - Review Complete 02/06/2018  Allergen Reaction Noted  . Codeine Nausea Only 08/18/2011  . Sulfa antibiotics Nausea Only 08/18/2011    Family History  Problem Relation Age of Onset  . Diabetes type II Sister   . Heart attack Cousin   . Colon cancer Neg Hx   . Stomach cancer Neg Hx     Social History   Socioeconomic History  . Marital status: Single    Spouse name: Not on file  . Number of children: Not on file  . Years of education: Not on file  . Highest education level: Not on file  Occupational History  . Occupation: truck Education administrator:  [employer]  . Financial resource strain: Not on file  . Food insecurity:    Worry: Not on file    Inability: Not on file  . Transportation needs:    Medical: Not on file    Non-medical: Not on file  Tobacco Use  . Smoking status: Former Smoker    Last attempt to quit: 08/18/1995    Years since quitting: 22.4  . Smokeless tobacco: Never Used  . Tobacco comment: Quit smoking x 20 years  Substance and Sexual Activity  . Alcohol use: No    Alcohol/week: 0.0 standard drinks  . Drug use: No  . Sexual activity: Not Currently  Lifestyle  . Physical activity:    Days per week: Not on file    Minutes per session: Not on file  . Stress: Not on file  Relationships  . Social connections:    Talks on phone: Not on file    Gets together: Not on file    Attends religious service: Not on file    Active member of club or organization: Not on file    Attends meetings of clubs or organizations: Not on file    Relationship status: Not on file  Other Topics Concern  . Not on file  Social History Narrative   Divorced, no children, works as a Production designer, theatre/television/film.    Review of Systems: General: Negative for anorexia, weight loss, fever, chills, fatigue, weakness. ENT: Negative for hoarseness. CV: Negative for chest pain, angina, palpitations, peripheral edema.  Respiratory: Negative for dyspnea at rest, cough, sputum, wheezing.  GI: See history of present illness. Endo: Negative for unusual weight change.  Heme: Negative for bruising or bleeding. Allergy: Negative for rash or hives.   Physical Exam: BP 135/78   Pulse 86   Temp (!) 97.3 F (36.3 C) (Oral)   Ht 6' 3"  (1.905 m)   Wt (!) 311 lb 9.6 oz (141.3 kg)   BMI 38.95 kg/m  General:   Alert and oriented. Pleasant and cooperative. Well-nourished and well-developed.  Eyes:  Without icterus, sclera clear and conjunctiva pink.  Ears:  Normal auditory acuity. Cardiovascular:  S1, S2 present without murmurs  appreciated. Extremities without clubbing or edema. Respiratory:  Clear to auscultation bilaterally. No wheezes, rales, or rhonchi. No distress.  Gastrointestinal:  +BS, soft,  non-tender and non-distended. No HSM noted. No guarding or rebound. No masses appreciated.  Rectal:  Deferred  Musculoskalatal:  Symmetrical without gross deformities. Neurologic:  Alert and oriented x4;  grossly normal neurologically. Psych:  Alert and cooperative. Normal mood and affect. Heme/Lymph/Immune: No excessive bruising noted.    02/06/2018 10:45 AM   Disclaimer: This note was dictated with voice recognition software. Similar sounding words can inadvertently be transcribed and may not be corrected upon review.

## 2018-02-06 NOTE — Progress Notes (Deleted)
Today he states he's having epigastric nausea. Knows he has a hiatal hernia. Has hurting in his jaws, dizziness. Vision has gotten blurry since his epigastric pain started. Intermittent epigastric pain. Epigastric pain isn't really "pain" but more discomfort, occurs 3-4 times a day, lasts up to all day. Only known triggers are steak (if he doesn't chew well) and will get hung and not go down. If he eats too fast and doesn't chew very well will have solid food dysphagia, no pill dysphagia. Food dysphagia 2-3 times a month, no regurgitation, has to drink a lot to get it to go down. Denies other abdominal pain, hematochezia, melena, fever, chills, unintentional weight loss. Has acid regurgitation (reflux) which happens somewhat regularly, dependent on oral intake although he cannot name specific triggers. Nexium has been working well until his new onset of symptoms. Denies chest pain, dyspnea, dizziness, lightheadedness, syncope, near syncope. Denies any other upper or lower GI symptoms.

## 2018-02-06 NOTE — Assessment & Plan Note (Signed)
Noted chronic history of GERD previously tried on Protonix and Dexilant.  He was started on Nexium at his last visit.  He did not follow-up as recommended and is not provided progress reports.  It is been over a year since we have seen him.  He presented wanting to again discuss his hiatal hernia.  He thinks his hiatal hernia, which is small on last endoscopy, is the cause of the majority of his GERD symptoms as well as jaw pain, vision changes, and dizziness.  Some noted food dysphagia about 2-3 times a month without regurgitation.  He was seen by [facility] and had what appears to be a normal evaluation.  I discharged him in Carafate and Reglan.  At this point I have recommended he continue Nexium, he has Carafate at home which he can take.  We will set him up for upper endoscopy to further evaluate his hiatal hernia for any changes as well as evaluate for etiologies of dysphasia including but not limited to chronic GERD, stricture, web, ring.  Return for follow-up in 3 months.

## 2018-02-06 NOTE — Assessment & Plan Note (Signed)
The patient has nausea without vomiting.  This could be due to poorly controlled GERD, esophagitis, gastritis.  His GERD symptoms seem to be worse than his baseline.  He is on Nexium and recommended he continue this.  He does have Carafate at home as needed.  We will plan for an upper endoscopy with possible dilation related to dysphasia which will also allow assessment of possible upper GI related causes of nausea.  Return for follow-up in 3 months.

## 2018-02-06 NOTE — Patient Instructions (Signed)
1. Continue taking Nexium. 2. We will schedule your upper endoscopy with possible dilation ("stretching") with Dr. Gala Romney. 3. Further recommendations will follow after your procedure. 4. Return for follow-up in 3 months. 5. Call us if you have any questions or concerns.  At [facility] we value your feedback. You may receive a survey about your visit today. Please share your experience as we strive to create trusting relationships with our patients to provide genuine, compassionate, quality care.  It was great to meet you today!  I hope you have a great summer!!

## 2018-02-06 NOTE — H&P (View-Only) (Signed)
  Referring Provider: Vyas, Dhruv B, MD Primary Care Physician:  Vyas, Dhruv B, MD Primary GI:  Dr. Rourk  Chief Complaint  Patient presents with  . Gastroesophageal Reflux  . Hiatal Hernia  . Nausea    HPI:   Jerome Shepherd is a 51 y.o. male who presents for evaluation of reflux and hernia.  Patient was last seen in our office 07/25/2016 for Nash, screening colonoscopy, GERD.  Noted history of Nash with grade 1 fibrosis on liver biopsy 2015.  EGD August 2017 with normal esophagus status post dilation, small hiatal hernia.  He did have complaints with difficulty with liquids "stopping" in the upper esophagus like an air pocket blocking the passage and he was offered a BPE to evaluate for possible Zenker's diverticulum but he declined.  At his last visit he noted some epigastric pain radiating into the center of his chest and both shoulders.  Cardiology has been seen.  Noted recent stress related to the death of his mother and sister age 57 of sudden MI.  No suspected cardiac issues based on work-up.  Patient also complained of typical heartburn and takes pantoprazole 40 mg daily but may take an additional 6-8 over-the-counter Zantac daily.  He will have loose explosive stools when he is having a flare of his heartburn.  Normal/solid stools when no upper GI symptoms present.  He is wondering if he should have his hiatal hernia repaired.  After his last visit recommended switch from pantoprazole to Dexilant and cut back on Zantac as well as a drug holiday due to possible tachycardia from Zantac.  First ever colonoscopy on deep sedation.  Recommended 4-month follow-up with Dr. Rourk.  Requested progress report in 2 weeks related to upper GI symptoms.  He called the next day advising that he tried Dexilant previously and could not take it.  A prescription for Nexium 40 mg twice daily was sent.  Colonoscopy completed 08/22/2016 which found a single 3 mm polyp in the rectum, otherwise normal.  Surgical  pathology found the polyp to be hyperplastic.  Recommended repeat colonoscopy in 10 years.  Also recommended six-month follow-up in office.  The patient did not provide further progress reports.  He did not follow-up as recommended.  Today he states he's having epigastric nausea. Knows he has a hiatal hernia. Has hurting in his jaws, dizziness. Vision has gotten blurry since his epigastric pain started. Intermittent epigastric pain. Epigastric pain isn't really "pain" but more discomfort, occurs 3-4 times a day, lasts up to all day. Only known triggers are steak (if he doesn't chew well) and will get hung and not go down. If he eats too fast and doesn't chew very well will have solid food dysphagia, no pill dysphagia. Food dysphagia 2-3 times a month, no regurgitation, has to drink a lot to get it to go down. Denies other abdominal pain, hematochezia, melena, fever, chills, unintentional weight loss. Has acid regurgitation (reflux) which happens somewhat regularly, dependent on oral intake although he cannot name specific triggers. Nexium has been working well until his new onset of symptoms. Denies chest pain, dyspnea, dizziness, lightheadedness, syncope, near syncope. Denies any other upper or lower GI symptoms.  Reviewed records provided by the patient from [facility] when he presented for abdominal pain, nausea, hyperglycemia.  It appears they discharged him on Reglan and Carafate.  Reviewed labs including CBC, CMP which were essentially normal.  Past Medical History:  Diagnosis Date  . Antral erosion 01/17/2014   Per   EGD 01/10/14- not likely the cause of his symtoms   . CAD (coronary artery disease), minimal, non obstructive 2013 01/01/2013  . Complication of anesthesia   . Constipation   . Coronary artery spasm (HCC) 2013  . Diabetes mellitus   . GERD (gastroesophageal reflux disease)   . Hyperlipidemia   . Hypertension   . Myocardial infarction (HCC)    2012  . PONV (postoperative  nausea and vomiting)     Past Surgical History:  Procedure Laterality Date  . CARDIAC CATHETERIZATION  2013   non obstructive CAD  ~20%  . CHOLECYSTECTOMY N/A 04/01/2014   Procedure: LAPAROSCOPIC CHOLECYSTECTOMY;  Surgeon: Mark A Jenkins, MD;  Location: [facility];  Service: General;  Laterality: N/A;  . COLONOSCOPY WITH PROPOFOL N/A 08/22/2016   Procedure: COLONOSCOPY WITH PROPOFOL;  Surgeon: Robert M Rourk, MD;  Location: [facility];  Service: Endoscopy;  Laterality: N/A;  730   . ESOPHAGOGASTRODUODENOSCOPY N/A 01/10/2014   Dr. Rourk: antral erosions, path with chronic inactive gastritis. Empiric dilation with 56-F dilation  . ESOPHAGOGASTRODUODENOSCOPY (EGD) WITH PROPOFOL N/A 02/16/2016   Dr. Rourk: small hiatal hernia, normal esopagus s/p empiric dilation  . KNEE ARTHROSCOPY Right 2002  . LEFT HEART CATH N/A 08/18/2011   Procedure: LEFT HEART CATH;  Surgeon: Jonathan J Berry, MD;  Location: [facility];  Service: Cardiovascular;  Laterality: N/A;  . LIVER BIOPSY N/A 04/01/2014   mild fibrosis  . MALONEY DILATION N/A 01/10/2014   Procedure: MALONEY DILATION;  Surgeon: Robert M Rourk, MD;  Location: [facility];  Service: Endoscopy;  Laterality: N/A;  . MALONEY DILATION N/A 02/16/2016   Procedure: MALONEY DILATION;  Surgeon: Robert M Rourk, MD;  Location: [facility];  Service: Endoscopy;  Laterality: N/A;  . POLYPECTOMY  08/22/2016   Procedure: POLYPECTOMY;  Surgeon: Robert M Rourk, MD;  Location: [facility];  Service: Endoscopy;;  . SAVORY DILATION N/A 01/10/2014   Procedure: SAVORY DILATION;  Surgeon: Robert M Rourk, MD;  Location: [facility];  Service: Endoscopy;  Laterality: N/A;    Current Outpatient Medications  Medication Sig Dispense Refill  . amLODipine (NORVASC) 10 MG tablet TAKE ONE TABLET BY MOUTH EVERY DAY PATIENT NEEDS OFFICE VISIT 7 tablet 0  . Ascorbic Acid (VITAMIN C) 1000 MG tablet Take 1,000 mg by mouth as needed. Takes during the winter    . aspirin EC 81 MG  tablet Take 1 tablet (81 mg total) by mouth daily.    . EDARBI 40 MG TABS TAKE ONE TABLET BY MOUTH DAILY. 30 tablet 6  . esomeprazole (NEXIUM) 40 MG capsule TAKE ONE CAPSULE BY MOUTH TWICE DAILY BEFORE a meal 60 capsule 5  . glimepiride (AMARYL) 4 MG tablet Take 4 mg by mouth daily.   3  . isosorbide mononitrate (IMDUR) 60 MG 24 hr tablet TAKE ONE TABLET BY MOUTH EVERY DAY PATIENT NEEDS OFFICE VISIT 7 tablet 0  . Magnesium 500 MG TABS Take 1 tablet by mouth daily as needed (leg cramps).    . meclizine (ANTIVERT) 25 MG tablet Take 1 tablet by mouth as needed.  0  . metFORMIN (GLUCOPHAGE) 500 MG tablet Take 500 mg by mouth daily with breakfast.     . metoCLOPramide (REGLAN) 10 MG tablet Take 1 tablet by mouth as directed.  0  . nitroGLYCERIN (NITROSTAT) 0.4 MG SL tablet Place 1 tablet (0.4 mg total) under the tongue every 5 (five) minutes as needed for chest pain. 25 tablet 3  . simvastatin (  ZOCOR) 40 MG tablet TAKE ONE TABLET BY MOUTH EVERY EVENING 7 tablet 0  . DiphenhydrAMINE HCl, Sleep, (SIMPLY SLEEP PO) Take 1-2 tablets by mouth at bedtime as needed (sleep).    . HYDROcodone-acetaminophen (NORCO) 5-325 MG tablet Take 1-2 tablets by mouth every 4 (four) hours as needed for severe pain. (Patient not taking: Reported on 02/06/2018) 15 tablet 0   No current facility-administered medications for this visit.     Allergies as of 02/06/2018 - Review Complete 02/06/2018  Allergen Reaction Noted  . Codeine Nausea Only 08/18/2011  . Sulfa antibiotics Nausea Only 08/18/2011    Family History  Problem Relation Age of Onset  . Diabetes type II Sister   . Heart attack Cousin   . Colon cancer Neg Hx   . Stomach cancer Neg Hx     Social History   Socioeconomic History  . Marital status: Single    Spouse name: Not on file  . Number of children: Not on file  . Years of education: Not on file  . Highest education level: Not on file  Occupational History  . Occupation: truck driver    Employer:  [employer]  Social Needs  . Financial resource strain: Not on file  . Food insecurity:    Worry: Not on file    Inability: Not on file  . Transportation needs:    Medical: Not on file    Non-medical: Not on file  Tobacco Use  . Smoking status: Former Smoker    Last attempt to quit: 08/18/1995    Years since quitting: 22.4  . Smokeless tobacco: Never Used  . Tobacco comment: Quit smoking x 20 years  Substance and Sexual Activity  . Alcohol use: No    Alcohol/week: 0.0 standard drinks  . Drug use: No  . Sexual activity: Not Currently  Lifestyle  . Physical activity:    Days per week: Not on file    Minutes per session: Not on file  . Stress: Not on file  Relationships  . Social connections:    Talks on phone: Not on file    Gets together: Not on file    Attends religious service: Not on file    Active member of club or organization: Not on file    Attends meetings of clubs or organizations: Not on file    Relationship status: Not on file  Other Topics Concern  . Not on file  Social History Narrative   Divorced, no children, works as a commercial truck driver.    Review of Systems: General: Negative for anorexia, weight loss, fever, chills, fatigue, weakness. ENT: Negative for hoarseness. CV: Negative for chest pain, angina, palpitations, peripheral edema.  Respiratory: Negative for dyspnea at rest, cough, sputum, wheezing.  GI: See history of present illness. Endo: Negative for unusual weight change.  Heme: Negative for bruising or bleeding. Allergy: Negative for rash or hives.   Physical Exam: BP 135/78   Pulse 86   Temp (!) 97.3 F (36.3 C) (Oral)   Ht 6' 3" (1.905 m)   Wt (!) 311 lb 9.6 oz (141.3 kg)   BMI 38.95 kg/m  General:   Alert and oriented. Pleasant and cooperative. Well-nourished and well-developed.  Eyes:  Without icterus, sclera clear and conjunctiva pink.  Ears:  Normal auditory acuity. Cardiovascular:  S1, S2 present without murmurs  appreciated. Extremities without clubbing or edema. Respiratory:  Clear to auscultation bilaterally. No wheezes, rales, or rhonchi. No distress.  Gastrointestinal:  +BS, soft,   non-tender and non-distended. No HSM noted. No guarding or rebound. No masses appreciated.  Rectal:  Deferred  Musculoskalatal:  Symmetrical without gross deformities. Neurologic:  Alert and oriented x4;  grossly normal neurologically. Psych:  Alert and cooperative. Normal mood and affect. Heme/Lymph/Immune: No excessive bruising noted.    02/06/2018 10:45 AM   Disclaimer: This note was dictated with voice recognition software. Similar sounding words can inadvertently be transcribed and may not be corrected upon review.  

## 2018-02-06 NOTE — Assessment & Plan Note (Signed)
Noted dysphasia with solid foods about 2-3 times a month.  Ongoing GERD symptoms which he feels is worse than his chronic.  Continues on Nexium and has Carafate at home.  We will plan for an upper endoscopy for possible dilation.  See further information above.  Proceed with EGD +/- dilation fall/MAC with Dr. Gala Romney in near future: the risks, benefits, and alternatives have been discussed with the patient in detail. The patient states understanding and desires to proceed.  Patiently is currently on Norco.  No other anticoagulants, anxiolytics, chronic pain medications, or antidepressants.  He seems quite anxious.  His last procedure was on propofol/MAC.  We will plan for the procedure on propofol/MAC to promote adequate sedation.

## 2018-02-09 ENCOUNTER — Telehealth: Payer: Self-pay

## 2018-02-09 NOTE — Progress Notes (Signed)
CC'D TO PCP °

## 2018-02-09 NOTE — Telephone Encounter (Signed)
Called and informed pt of pre-op appt 02/13/18 at 8:00am. Letter mailed.

## 2018-02-09 NOTE — Telephone Encounter (Signed)
PA info for EGD/-/+DIL submitted via [facility] website.

## 2018-02-11 NOTE — Telephone Encounter (Signed)
Called [employer], requested fax with passcode to f/u on PA.

## 2018-02-11 NOTE — Telephone Encounter (Signed)
Called [employer]. Case was approved. PA# [id number]. Valid 02/19/18-03/21/18.

## 2018-02-12 NOTE — Patient Instructions (Signed)
Jerome Shepherd  02/12/2018     @PREFPERIOPPHARMACY @   Your procedure is scheduled on  02/19/2018  Report to [facility] at  845  A.M.  Call this number if you have problems the morning of surgery:  [phone]   Remember:  Do not eat or drink after midnight.  You may drink clear liquids until (follow the instructions given to you).  Clear liquids allowed are:                    Water, Juice (non-citric and without pulp), Carbonated beverages, Clear Tea, Black Coffee only, Plain Jell-O only, Gatorade and Plain Popsicles only    Take these medicines the morning of surgery with A SIP OF WATER  Amlodipine, adarbi, nexium, imdur, antivert, reglan (if needed).    Do not wear jewelry, make-up or nail polish.  Do not wear lotions, powders, or perfumes, or deodorant.  Do not shave 48 hours prior to surgery.  Men may shave face and neck.  Do not bring valuables to the hospital.  [facility] is not responsible for any belongings or valuables.  Contacts, dentures or bridgework may not be worn into surgery.  Leave your suitcase in the car.  After surgery it may be brought to your room.  For patients admitted to the hospital, discharge time will be determined by your treatment team.  Patients discharged the day of surgery will not be allowed to drive home.   Name and phone number of your driver:   family Special instructions:  Follow the diet and prep instructions given to you by Dr Roseanne Kaufman office.  Please read over the following fact sheets that you were given. Anesthesia Post-op Instructions and Care and Recovery After Surgery       Esophagogastroduodenoscopy Esophagogastroduodenoscopy (EGD) is a procedure to examine the lining of the esophagus, stomach, and first part of the small intestine (duodenum). This procedure is done to check for problems such as inflammation, bleeding, ulcers, or growths. During this procedure, a long, flexible, lighted tube with a camera  attached (endoscope) is inserted down the throat. Tell a health care provider about:  Any allergies you have.  All medicines you are taking, including vitamins, herbs, eye drops, creams, and over-the-counter medicines.  Any problems you or family members have had with anesthetic medicines.  Any blood disorders you have.  Any surgeries you have had.  Any medical conditions you have.  Whether you are pregnant or may be pregnant. What are the risks? Generally, this is a safe procedure. However, problems may occur, including:  Infection.  Bleeding.  A tear (perforation) in the esophagus, stomach, or duodenum.  Trouble breathing.  Excessive sweating.  Spasms of the larynx.  A slowed heartbeat.  Low blood pressure.  What happens before the procedure?  Follow instructions from your health care provider about eating or drinking restrictions.  Ask your health care provider about: ? Changing or stopping your regular medicines. This is especially important if you are taking diabetes medicines or blood thinners. ? Taking medicines such as aspirin and ibuprofen. These medicines can thin your blood. Do not take these medicines before your procedure if your health care provider instructs you not to.  Plan to have someone take you home after the procedure.  If you wear dentures, be ready to remove them before the procedure. What happens during the procedure?  To reduce your risk of infection, your health  care team will wash or sanitize their hands.  An IV tube will be put in a vein in your hand or arm. You will get medicines and fluids through this tube.  You will be given one or more of the following: ? A medicine to help you relax (sedative). ? A medicine to numb the area (local anesthetic). This medicine may be sprayed into your throat. It will make you feel more comfortable and keep you from gagging or coughing during the procedure. ? A medicine for pain.  A mouth guard  may be placed in your mouth to protect your teeth and to keep you from biting on the endoscope.  You will be asked to lie on your left side.  The endoscope will be lowered down your throat into your esophagus, stomach, and duodenum.  Air will be put into the endoscope. This will help your health care provider see better.  The lining of your esophagus, stomach, and duodenum will be examined.  Your health care provider may: ? Take a tissue sample so it can be looked at in a lab (biopsy). ? Remove growths. ? Remove objects (foreign bodies) that are stuck. ? Treat any bleeding with medicines or other devices that stop tissue from bleeding. ? Widen (dilate) or stretch narrowed areas of your esophagus and stomach.  The endoscope will be taken out. The procedure may vary among health care providers and hospitals. What happens after the procedure?  Your blood pressure, heart rate, breathing rate, and blood oxygen level will be monitored often until the medicines you were given have worn off.  Do not eat or drink anything until the numbing medicine has worn off and your gag reflex has returned. This information is not intended to replace advice given to you by your health care provider. Make sure you discuss any questions you have with your health care provider. Document Released: 10/11/2004 Document Revised: 11/16/2015 Document Reviewed: 05/04/2015 Elsevier Interactive Patient Education  2018 [employer]. Esophagogastroduodenoscopy, Care After Refer to this sheet in the next few weeks. These instructions provide you with information about caring for yourself after your procedure. Your health care provider may also give you more specific instructions. Your treatment has been planned according to current medical practices, but problems sometimes occur. Call your health care provider if you have any problems or questions after your procedure. What can I expect after the procedure? After the  procedure, it is common to have:  A sore throat.  Nausea.  Bloating.  Dizziness.  Fatigue.  Follow these instructions at home:  Do not eat or drink anything until the numbing medicine (local anesthetic) has worn off and your gag reflex has returned. You will know that the local anesthetic has worn off when you can swallow comfortably.  Do not drive for 24 hours if you received a medicine to help you relax (sedative).  If your health care provider took a tissue sample for testing during the procedure, make sure to get your test results. This is your responsibility. Ask your health care provider or the department performing the test when your results will be ready.  Keep all follow-up visits as told by your health care provider. This is important. Contact a health care provider if:  You cannot stop coughing.  You are not urinating.  You are urinating less than usual. Get help right away if:  You have trouble swallowing.  You cannot eat or drink.  You have throat or chest pain that gets worse.  You are dizzy or light-headed.  You faint.  You have nausea or vomiting.  You have chills.  You have a fever.  You have severe abdominal pain.  You have black, tarry, or bloody stools. This information is not intended to replace advice given to you by your health care provider. Make sure you discuss any questions you have with your health care provider. Document Released: 05/27/2012 Document Revised: 11/16/2015 Document Reviewed: 05/04/2015 Elsevier Interactive Patient Education  2018 [employer].  Esophageal Dilatation Esophageal dilatation is a procedure to open a blocked or narrowed part of the esophagus. The esophagus is the long tube in your throat that carries food and liquid from your mouth to your stomach. The procedure is also called esophageal dilation. You may need this procedure if you have a buildup of scar tissue in your esophagus that makes it difficult,  painful, or even impossible to swallow. This can be caused by gastroesophageal reflux disease (GERD). In rare cases, people need this procedure because they have cancer of the esophagus or a problem with the way food moves through the esophagus. Sometimes you may need to have another dilatation to enlarge the opening of the esophagus gradually. Tell a health care provider about:  Any allergies you have.  All medicines you are taking, including vitamins, herbs, eye drops, creams, and over-the-counter medicines.  Any problems you or family members have had with anesthetic medicines.  Any blood disorders you have.  Any surgeries you have had.  Any medical conditions you have.  Any antibiotic medicines you are required to take before dental procedures. What are the risks? Generally, this is a safe procedure. However, problems can occur and include:  Bleeding from a tear in the lining of the esophagus.  A hole (perforation) in the esophagus.  What happens before the procedure?  Do not eat or drink anything after midnight on the night before the procedure or as directed by your health care provider.  Ask your health care provider about changing or stopping your regular medicines. This is especially important if you are taking diabetes medicines or blood thinners.  Plan to have someone take you home after the procedure. What happens during the procedure?  You will be given a medicine that makes you relaxed and sleepy (sedative).  A medicine may be sprayed or gargled to numb the back of the throat.  Your health care provider can use various instruments to do an esophageal dilatation. During the procedure, the instrument used will be placed in your mouth and passed down into your esophagus. Options include: ? Simple dilators. This instrument is carefully placed in the esophagus to stretch it. ? Guided wire bougies. In this method, a flexible tube (endoscope) is used to insert a wire into  the esophagus. The dilator is passed over this wire to enlarge the esophagus. Then the wire is removed. ? Balloon dilators. An endoscope with a small balloon at the end is passed down into the esophagus. Inflating the balloon gently stretches the esophagus and opens it up. What happens after the procedure?  Your blood pressure, heart rate, breathing rate, and blood oxygen level will be monitored often until the medicines you were given have worn off.  Your throat may feel slightly sore and will probably still feel numb. This will improve slowly over time.  You will not be allowed to eat or drink until the throat numbness has resolved.  If this is a same-day procedure, you may be allowed to go home  once you have been able to drink, urinate, and sit on the edge of the bed without nausea or dizziness.  If this is a same-day procedure, you should have a friend or family member with you for the next 24 hours after the procedure. This information is not intended to replace advice given to you by your health care provider. Make sure you discuss any questions you have with your health care provider. Document Released: 08/01/2005 Document Revised: 11/16/2015 Document Reviewed: 10/20/2013 Elsevier Interactive Patient Education  2018 [place] Anesthesia is a term that refers to techniques, procedures, and medicines that help a person stay safe and comfortable during a medical procedure. Monitored anesthesia care, or sedation, is one type of anesthesia. Your anesthesia specialist may recommend sedation if you will be having a procedure that does not require you to be unconscious, such as:  Cataract surgery.  A dental procedure.  A biopsy.  A colonoscopy.  During the procedure, you may receive a medicine to help you relax (sedative). There are three levels of sedation:  Mild sedation. At this level, you may feel awake and relaxed. You will be able to follow  directions.  Moderate sedation. At this level, you will be sleepy. You may not remember the procedure.  Deep sedation. At this level, you will be asleep. You will not remember the procedure.  The more medicine you are given, the deeper your level of sedation will be. Depending on how you respond to the procedure, the anesthesia specialist may change your level of sedation or the type of anesthesia to fit your needs. An anesthesia specialist will monitor you closely during the procedure. Let your health care provider know about:  Any allergies you have.  All medicines you are taking, including vitamins, herbs, eye drops, creams, and over-the-counter medicines.  Any use of steroids (by mouth or as a cream).  Any problems you or family members have had with sedatives and anesthetic medicines.  Any blood disorders you have.  Any surgeries you have had.  Any medical conditions you have, such as sleep apnea.  Whether you are pregnant or may be pregnant.  Any use of cigarettes, alcohol, or street drugs. What are the risks? Generally, this is a safe procedure. However, problems may occur, including:  Getting too much medicine (oversedation).  Nausea.  Allergic reaction to medicines.  Trouble breathing. If this happens, a breathing tube may be used to help with breathing. It will be removed when you are awake and breathing on your own.  Heart trouble.  Lung trouble.  Before the procedure Staying hydrated Follow instructions from your health care provider about hydration, which may include:  Up to 2 hours before the procedure - you may continue to drink clear liquids, such as water, clear fruit juice, black coffee, and plain tea.  Eating and drinking restrictions Follow instructions from your health care provider about eating and drinking, which may include:  8 hours before the procedure - stop eating heavy meals or foods such as meat, fried foods, or fatty foods.  6 hours  before the procedure - stop eating light meals or foods, such as toast or cereal.  6 hours before the procedure - stop drinking milk or drinks that contain milk.  2 hours before the procedure - stop drinking clear liquids.  Medicines Ask your health care provider about:  Changing or stopping your regular medicines. This is especially important if you are taking diabetes medicines or blood thinners.  Taking medicines such as aspirin and ibuprofen. These medicines can thin your blood. Do not take these medicines before your procedure if your health care provider instructs you not to.  Tests and exams  You will have a physical exam.  You may have blood tests done to show: ? How well your kidneys and liver are working. ? How well your blood can clot.  General instructions  Plan to have someone take you home from the hospital or clinic.  If you will be going home right after the procedure, plan to have someone with you for 24 hours.  What happens during the procedure?  Your blood pressure, heart rate, breathing, level of pain and overall condition will be monitored.  An IV tube will be inserted into one of your veins.  Your anesthesia specialist will give you medicines as needed to keep you comfortable during the procedure. This may mean changing the level of sedation.  The procedure will be performed. After the procedure  Your blood pressure, heart rate, breathing rate, and blood oxygen level will be monitored until the medicines you were given have worn off.  Do not drive for 24 hours if you received a sedative.  You may: ? Feel sleepy, clumsy, or nauseous. ? Feel forgetful about what happened after the procedure. ? Have a sore throat if you had a breathing tube during the procedure. ? Vomit. This information is not intended to replace advice given to you by your health care provider. Make sure you discuss any questions you have with your health care provider. Document  Released: 03/06/2005 Document Revised: 11/17/2015 Document Reviewed: 10/01/2015 Elsevier Interactive Patient Education  2018 [place], Care After These instructions provide you with information about caring for yourself after your procedure. Your health care provider may also give you more specific instructions. Your treatment has been planned according to current medical practices, but problems sometimes occur. Call your health care provider if you have any problems or questions after your procedure. What can I expect after the procedure? After your procedure, it is common to:  Feel sleepy for several hours.  Feel clumsy and have poor balance for several hours.  Feel forgetful about what happened after the procedure.  Have poor judgment for several hours.  Feel nauseous or vomit.  Have a sore throat if you had a breathing tube during the procedure.  Follow these instructions at home: For at least 24 hours after the procedure:   Do not: ? Participate in activities in which you could fall or become injured. ? Drive. ? Use heavy machinery. ? Drink alcohol. ? Take sleeping pills or medicines that cause drowsiness. ? Make important decisions or sign legal documents. ? Take care of children on your own.  Rest. Eating and drinking  Follow the diet that is recommended by your health care provider.  If you vomit, drink water, juice, or soup when you can drink without vomiting.  Make sure you have little or no nausea before eating solid foods. General instructions  Have a responsible adult stay with you until you are awake and alert.  Take over-the-counter and prescription medicines only as told by your health care provider.  If you smoke, do not smoke without supervision.  Keep all follow-up visits as told by your health care provider. This is important. Contact a health care provider if:  You keep feeling nauseous or you keep  vomiting.  You feel light-headed.  You develop a rash.  You  have a fever. Get help right away if:  You have trouble breathing. This information is not intended to replace advice given to you by your health care provider. Make sure you discuss any questions you have with your health care provider. Document Released: 10/01/2015 Document Revised: 01/31/2016 Document Reviewed: 10/01/2015 Elsevier Interactive Patient Education  [employer].

## 2018-02-13 ENCOUNTER — Encounter (HOSPITAL_COMMUNITY)
Admission: RE | Admit: 2018-02-13 | Discharge: 2018-02-13 | Disposition: A | Discharge status: Home / Self Care | Payer: Commercial Managed Care - PPO | Source: Ambulatory Visit | Attending: Internal Medicine | Admitting: Internal Medicine

## 2018-02-13 ENCOUNTER — Encounter (HOSPITAL_COMMUNITY): Payer: Self-pay

## 2018-02-13 ENCOUNTER — Other Ambulatory Visit: Payer: Self-pay

## 2018-02-13 HISTORY — DX: Unspecified osteoarthritis, unspecified site: M19.90

## 2018-02-19 ENCOUNTER — Ambulatory Visit (HOSPITAL_COMMUNITY)
Admission: RE | Admit: 2018-02-19 | Discharge: 2018-02-19 | Disposition: A | Discharge status: Home / Self Care | Payer: Commercial Managed Care - PPO | Source: Ambulatory Visit | Attending: Internal Medicine | Admitting: Internal Medicine

## 2018-02-19 ENCOUNTER — Other Ambulatory Visit: Payer: Self-pay

## 2018-02-19 ENCOUNTER — Encounter (HOSPITAL_COMMUNITY): Payer: Self-pay | Admitting: *Deleted

## 2018-02-19 ENCOUNTER — Encounter (HOSPITAL_COMMUNITY)
Admission: RE | Disposition: A | Discharge status: Home / Self Care | Payer: Self-pay | Source: Ambulatory Visit | Attending: Internal Medicine

## 2018-02-19 ENCOUNTER — Ambulatory Visit (HOSPITAL_COMMUNITY): Payer: Commercial Managed Care - PPO | Admitting: Anesthesiology

## 2018-02-19 DIAGNOSIS — R1319 Other dysphagia: Secondary | ICD-10-CM

## 2018-02-19 DIAGNOSIS — R196 Halitosis: Secondary | ICD-10-CM | POA: Insufficient documentation

## 2018-02-19 DIAGNOSIS — E785 Hyperlipidemia, unspecified: Secondary | ICD-10-CM | POA: Insufficient documentation

## 2018-02-19 DIAGNOSIS — K219 Gastro-esophageal reflux disease without esophagitis: Secondary | ICD-10-CM | POA: Diagnosis not present

## 2018-02-19 DIAGNOSIS — I252 Old myocardial infarction: Secondary | ICD-10-CM | POA: Diagnosis not present

## 2018-02-19 DIAGNOSIS — I10 Essential (primary) hypertension: Secondary | ICD-10-CM | POA: Insufficient documentation

## 2018-02-19 DIAGNOSIS — R1310 Dysphagia, unspecified: Secondary | ICD-10-CM | POA: Diagnosis not present

## 2018-02-19 DIAGNOSIS — Z7982 Long term (current) use of aspirin: Secondary | ICD-10-CM | POA: Diagnosis not present

## 2018-02-19 DIAGNOSIS — K449 Diaphragmatic hernia without obstruction or gangrene: Secondary | ICD-10-CM

## 2018-02-19 DIAGNOSIS — Z87891 Personal history of nicotine dependence: Secondary | ICD-10-CM | POA: Insufficient documentation

## 2018-02-19 DIAGNOSIS — Z79899 Other long term (current) drug therapy: Secondary | ICD-10-CM | POA: Diagnosis not present

## 2018-02-19 DIAGNOSIS — I2510 Atherosclerotic heart disease of native coronary artery without angina pectoris: Secondary | ICD-10-CM | POA: Insufficient documentation

## 2018-02-19 DIAGNOSIS — K7581 Nonalcoholic steatohepatitis (NASH): Secondary | ICD-10-CM | POA: Insufficient documentation

## 2018-02-19 DIAGNOSIS — Z885 Allergy status to narcotic agent status: Secondary | ICD-10-CM | POA: Diagnosis not present

## 2018-02-19 DIAGNOSIS — E1165 Type 2 diabetes mellitus with hyperglycemia: Secondary | ICD-10-CM | POA: Insufficient documentation

## 2018-02-19 DIAGNOSIS — Z7984 Long term (current) use of oral hypoglycemic drugs: Secondary | ICD-10-CM | POA: Diagnosis not present

## 2018-02-19 DIAGNOSIS — Z882 Allergy status to sulfonamides status: Secondary | ICD-10-CM | POA: Diagnosis not present

## 2018-02-19 DIAGNOSIS — R110 Nausea: Secondary | ICD-10-CM

## 2018-02-19 HISTORY — PX: BIOPSY: SHX5522

## 2018-02-19 HISTORY — PX: MALONEY DILATION: SHX5535

## 2018-02-19 HISTORY — PX: ESOPHAGOGASTRODUODENOSCOPY (EGD) WITH PROPOFOL: SHX5813

## 2018-02-19 LAB — GLUCOSE, CAPILLARY
GLUCOSE-CAPILLARY: 185 mg/dL — AB (ref 70–99)
GLUCOSE-CAPILLARY: 153 mg/dL — AB (ref 70–99)

## 2018-02-19 SURGERY — ESOPHAGOGASTRODUODENOSCOPY (EGD) WITH PROPOFOL
Anesthesia: General

## 2018-02-19 MED ORDER — SIMETHICONE 40 MG/0.6ML PO SUSP
ORAL | Status: AC
  Filled 2018-02-19: qty 1.2

## 2018-02-19 MED ORDER — SUCCINYLCHOLINE CHLORIDE 20 MG/ML IJ SOLN
INTRAMUSCULAR | Status: AC
  Filled 2018-02-19: qty 1

## 2018-02-19 MED ORDER — ONDANSETRON HCL 4 MG/2ML IJ SOLN
INTRAMUSCULAR | Status: AC
  Filled 2018-02-19: qty 2

## 2018-02-19 MED ORDER — LACTATED RINGERS IV SOLN
INTRAVENOUS | Status: DC
  Administered 2018-02-19: 10:00:00 via INTRAVENOUS

## 2018-02-19 MED ORDER — PROPOFOL 10 MG/ML IV BOLUS
INTRAVENOUS | Status: AC
  Filled 2018-02-19: qty 60

## 2018-02-19 MED ORDER — FENTANYL CITRATE (PF) 100 MCG/2ML IJ SOLN: 25.0000 ug | INTRAMUSCULAR | Status: DC | PRN

## 2018-02-19 MED ORDER — PROPOFOL 500 MG/50ML IV EMUL
INTRAVENOUS | Status: DC | PRN
  Administered 2018-02-19: 100 ug/kg/min via INTRAVENOUS

## 2018-02-19 MED ORDER — CHLORHEXIDINE GLUCONATE CLOTH 2 % EX PADS: 6.0000 | MEDICATED_PAD | Freq: Once | CUTANEOUS | Status: DC

## 2018-02-19 MED ORDER — ATROPINE SULFATE 0.4 MG/ML IJ SOLN
INTRAMUSCULAR | Status: AC
  Filled 2018-02-19: qty 1

## 2018-02-19 MED ORDER — PROPOFOL 10 MG/ML IV BOLUS
INTRAVENOUS | Status: DC | PRN
  Administered 2018-02-19 (×6): 20 mg via INTRAVENOUS
  Administered 2018-02-19: 40 mg via INTRAVENOUS
  Administered 2018-02-19: 20 mg via INTRAVENOUS

## 2018-02-19 MED ORDER — ONDANSETRON HCL 4 MG/2ML IJ SOLN
4.0000 mg | Freq: Once | INTRAMUSCULAR | Status: AC
  Administered 2018-02-19: 4 mg via INTRAVENOUS

## 2018-02-19 NOTE — Anesthesia Procedure Notes (Signed)
Procedure Name: MAC Date/Time: 02/19/2018 10:07 AM Performed by: Vista Deck, CRNA Pre-anesthesia Checklist: Patient identified, Emergency Drugs available, Suction available, Timeout performed and Patient being monitored Patient Re-evaluated:Patient Re-evaluated prior to induction Oxygen Delivery Method: Nasal Cannula

## 2018-02-19 NOTE — Discharge Instructions (Signed)
EGD Discharge instructions Please read the instructions outlined below and refer to this sheet in the next few weeks. These discharge instructions provide you with general information on caring for yourself after you leave the hospital. Your doctor may also give you specific instructions. While your treatment has been planned according to the most current medical practices available, unavoidable complications occasionally occur. If you have any problems or questions after discharge, please call your doctor. ACTIVITY  You may resume your regular activity but move at a slower pace for the next 24 hours.   Take frequent rest periods for the next 24 hours.   Walking will help expel (get rid of) the air and reduce the bloated feeling in your abdomen.   No driving for 24 hours (because of the anesthesia (medicine) used during the test).   You may shower.   Do not sign any important legal documents or operate any machinery for 24 hours (because of the anesthesia used during the test).  NUTRITION  Drink plenty of fluids.   You may resume your normal diet.   Begin with a light meal and progress to your normal diet.   Avoid alcoholic beverages for 24 hours or as instructed by your caregiver.  MEDICATIONS  You may resume your normal medications unless your caregiver tells you otherwise.  WHAT YOU CAN EXPECT TODAY  You may experience abdominal discomfort such as a feeling of fullness or gas pains.  FOLLOW-UP  Your doctor will discuss the results of your test with you.  SEEK IMMEDIATE MEDICAL ATTENTION IF ANY OF THE FOLLOWING OCCUR:  Excessive nausea (feeling sick to your stomach) and/or vomiting.   Severe abdominal pain and distention (swelling).   Trouble swallowing.   Temperature over 101 F (37.8 C).   Rectal bleeding or vomiting of blood.    Continue Nexium 40 mg twice daily  We'll follow up on biopsies.  Unless biopsy showing significant, I will be recommending referral  to tertiary center for further evaluation.

## 2018-02-19 NOTE — Op Note (Signed)
[facility] Patient Name: Jerome Shepherd Procedure Date: 02/19/2018 9:08 AM MRN: [id number] Date of Birth: 1965/10/18 Attending MD: Norvel Richards , MD CSN: [id number] Age: 52 Admit Type: Outpatient Procedure:                Upper GI endoscopy Indications:              Dysphagia; ; GERD regurgitation ; halitosis Providers:                Norvel Richards, MD, Jeanann Lewandowsky. Sharon Seller, RN,                            Nelma Rothman, Technician, Lin Landsman MD, MD Referring MD:              Medicines:                Propofol per Anesthesia Complications:            No immediate complications. Estimated Blood Loss:     Estimated blood loss was minimal. Procedure:                Pre-Anesthesia Assessment:                           - Prior to the procedure, a History and Physical                            was performed, and patient medications and                            allergies were reviewed. The patient's tolerance of                            previous anesthesia was also reviewed. The risks                            and benefits of the procedure and the sedation                            options and risks were discussed with the patient.                            All questions were answered, and informed consent                            was obtained. Prior Anticoagulants: The patient has                            taken no previous anticoagulant or antiplatelet                            agents. ASA Grade Assessment: II - A patient with                            mild systemic disease. After reviewing the risks  and benefits, the patient was deemed in                            satisfactory condition to undergo the procedure.                           After obtaining informed consent, the endoscope was                            passed under direct vision. Throughout the                            procedure, the patient's blood pressure,  pulse, and                            oxygen saturations were monitored continuously. The                            GIF-[id number] ([id number]) was introduced through the and                            advanced to the second part of duodenum. The upper                            GI endoscopy was accomplished without difficulty.                            The patient tolerated the procedure well. Scope In: 10:16:31 AM Scope Out: 10:25:14 AM Total Procedure Duration: 0 hours 8 minutes 43 seconds  Findings:      The examined esophagus was normal. The scope was withdrawn. Dilation was       performed with a Maloney dilator with no resistance at 56 Fr. The scope       was withdrawn. Dilation was performed with a Maloney dilator with mild       resistance at 30 Fr. The dilation site was examined following endoscope       reinsertion and showed no change. Estimated blood loss: none. This was       biopsied with a cold forceps for histology. Estimated blood loss was       minimal.      A small hiatal hernia was present.      The exam was otherwise without abnormality.      The duodenal bulb and second portion of the duodenum were normal. Impression:               - Normal esophagus. Dilated. Biopsied.                           - Tinyl hiatal hernia.                           - The examination was otherwise normal.                           - Normal duodenal bulb and second portion of the  duodenum. Some of patient's symptoms sound like                            rumination. An element of occult gastroparesis is                            not excluded as is non-acidic esophageal reflux. Moderate Sedation:      Moderate (conscious) sedation was personally administered by an       anesthesia professional. The following parameters were monitored: oxygen       saturation, heart rate, blood pressure, respiratory rate, EKG, adequacy       of pulmonary ventilation, and response to  care. Total physician       intraservice time was 16 minutes. Recommendation:           - Patient has a contact number available for                            emergencies. The signs and symptoms of potential                            delayed complications were discussed with the                            patient. Return to normal activities tomorrow.                            Written discharge instructions were provided to the                            patient.                           - Resume previous diet.                           - Continue present medications.                           - No repeat upper endoscopy. Continue Nexium 40 mg                            BID for the time being. Follow-up on pathology.                            Anticipate referral to tertiary center for                            cosideration of further evaluation includng                            pH/impedance testing while on high-dose acid                            suppression therapy, etc.                           -  Return to GI office (date not yet determined). Procedure Code(s):        --- Professional ---                           [id number], Esophagogastroduodenoscopy, flexible,                            transoral; with biopsy, single or multiple                           43450, Dilation of esophagus, by unguided sound or                            bougie, single or multiple passes Diagnosis Code(s):        --- Professional ---                           K44.9, Diaphragmatic hernia without obstruction or                            gangrene                           R13.10, Dysphagia, unspecified CPT copyright 2017 [employer]. All rights reserved. The codes documented in this report are preliminary and upon coder review may  be revised to meet current compliance requirements. Cristopher Estimable. Marquesha Robideau, MD Norvel Richards, MD 02/19/2018 10:36:39 AM This report has been signed  electronically. Rohini Raeanne Gathers MD, MD Number of Addenda: 0

## 2018-02-19 NOTE — Transfer of Care (Signed)
Immediate Anesthesia Transfer of Care Note  Patient: Jerome Shepherd  Procedure(s) Performed: ESOPHAGOGASTRODUODENOSCOPY (EGD) WITH PROPOFOL (N/A ) MALONEY DILATION (N/A ) BIOPSY  Patient Location: PACU  Anesthesia Type:General  Level of Consciousness: awake and patient cooperative  Airway & Oxygen Therapy: Patient Spontanous Breathing  Post-op Assessment: Report given to RN and Post -op Vital signs reviewed and stable  Post vital signs: Reviewed and stable  Last Vitals:  Vitals Value Taken Time  BP    Temp    Pulse    Resp    SpO2      Last Pain:  Vitals:   02/19/18 1009  TempSrc:   PainSc: 0-No pain      Patients Stated Pain Goal: 8 (48/18/59 0931)  Complications: No apparent anesthesia complications

## 2018-02-19 NOTE — Anesthesia Preprocedure Evaluation (Signed)
Anesthesia Evaluation  Patient identified by MRN, date of birth, ID band Patient awake    Reviewed: Allergy & Precautions, NPO status , Patient's Chart, lab work & pertinent test results  History of Anesthesia Complications (+) PONV  Airway Mallampati: I  TM Distance: >3 FB Neck ROM: Full    Dental no notable dental hx. (+) Teeth Intact   Pulmonary neg pulmonary ROS, former smoker,    Pulmonary exam normal breath sounds clear to auscultation       Cardiovascular Exercise Tolerance: Good hypertension, Pt. 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 Denies any SL NTG use    Neuro/Psych  Neuromuscular disease negative neurological ROS  negative psych ROS   GI/Hepatic negative GI ROS, Neg liver ROS, PUD, GERD  Medicated,(+) Hepatitis -NASH C/o Nausea today  Given Zofran in Preop    Endo/Other  negative endocrine ROSdiabetes, Well Controlled, Type 2, Oral Hypoglycemic Agents  Renal/GU Renal diseasenegative Renal ROS  negative genitourinary   Musculoskeletal negative musculoskeletal ROS (+) Arthritis , Osteoarthritis,    Abdominal   Peds negative pediatric ROS (+)  Hematology negative hematology ROS (+)   Anesthesia Other Findings   Reproductive/Obstetrics negative OB ROS                             Anesthesia Physical Anesthesia Plan  ASA: III  Anesthesia Plan: General   Post-op Pain Management:    Induction: Intravenous  PONV Risk Score and Plan:   Airway Management Planned: Simple Face Mask and Nasal Cannula  Additional Equipment:   Intra-op Plan:   Post-operative Plan:   Informed Consent: I have reviewed the patients History and Physical, chart, labs and discussed the procedure  including the risks, benefits and alternatives for the proposed anesthesia with the patient or authorized representative who has indicated his/her understanding and acceptance.   Dental advisory given  Plan Discussed with: CRNA  Anesthesia Plan Comments: (GETA as needed d/w pt )        Anesthesia Quick Evaluation

## 2018-02-19 NOTE — Interval H&P Note (Signed)
History and Physical Interval Note:  02/19/2018 10:00 AM  Jerome Shepherd  has presented today for surgery, with the diagnosis of GERD, dysphagia, nausea  The various methods of treatment have been discussed with the patient and family. After consideration of risks, benefits and other options for treatment, the patient has consented to  Procedure(s) with comments: ESOPHAGOGASTRODUODENOSCOPY (EGD) WITH PROPOFOL (N/A) - 10:15am MALONEY DILATION (N/A) as a surgical intervention .  The patient's history has been reviewed, patient examined, no change in status, stable for surgery.  I have reviewed the patient's chart and labs.  Questions were answered to the patient's satisfaction.     Jerome Shepherd  The patient seen and examined. Little response to dilation previously in 2017. Has significant nocturnal regurgitation as well as during the day in spite of  nexium 40 mg twice daily. EGD with ED as feasible/appropriate today per plan. Patient may need pH/impedance testing while on high-dose acid suppression therapy.  The risks, benefits, limitations, alternatives and imponderables have been reviewed with the patient. Potential for esophageal dilation, biopsy, etc. have also been reviewed.  Questions have been answered. All parties agreeable.

## 2018-02-19 NOTE — Anesthesia Postprocedure Evaluation (Signed)
Anesthesia Post Note  Patient: Jerome Shepherd  Procedure(s) Performed: ESOPHAGOGASTRODUODENOSCOPY (EGD) WITH PROPOFOL (N/A ) MALONEY DILATION (N/A ) BIOPSY  Patient location during evaluation: PACU Anesthesia Type: General Level of consciousness: awake and alert and patient cooperative Pain management: satisfactory to patient Vital Signs Assessment: post-procedure vital signs reviewed and stable Respiratory status: spontaneous breathing and patient connected to nasal cannula oxygen Cardiovascular status: stable Anesthetic complications: no     Last Vitals:  Vitals:   02/19/18 0831 02/19/18 1035  BP: 120/67 (!) 99/55  Pulse: 76 67  Resp: 14   Temp: 36.9 C (P) 36.6 C  SpO2: 97%     Last Pain:  Vitals:   02/19/18 1009  TempSrc:   PainSc: 0-No pain                 Kolbie Clarkston

## 2018-02-21 ENCOUNTER — Encounter: Payer: Self-pay | Admitting: Internal Medicine

## 2018-02-24 ENCOUNTER — Telehealth: Payer: Self-pay

## 2018-02-24 NOTE — Telephone Encounter (Signed)
Per RMR- Send letter to patient.  Send copy of letter with path to referring provider and PCP.   Ov w EG; sx do not all add up; may need to consider tertiary referral for further evalustion (?impedence/ph monitoring, etc)

## 2018-02-25 ENCOUNTER — Encounter (HOSPITAL_COMMUNITY): Payer: Self-pay | Admitting: Internal Medicine

## 2018-02-26 NOTE — Telephone Encounter (Signed)
Routing to Susan. 

## 2018-02-26 NOTE — Telephone Encounter (Signed)
Ok to leave ov with LL

## 2018-02-26 NOTE — Telephone Encounter (Signed)
OV made. 12/10 at 0930 with LSL. Does it need to be changed to EG?

## 2018-03-19 ENCOUNTER — Other Ambulatory Visit: Payer: Self-pay | Admitting: Nurse Practitioner

## 2018-05-08 ENCOUNTER — Encounter

## 2018-05-08 ENCOUNTER — Ambulatory Visit: Payer: Commercial Managed Care - PPO | Admitting: Gastroenterology

## 2018-06-02 ENCOUNTER — Ambulatory Visit: Payer: Commercial Managed Care - PPO | Admitting: Gastroenterology

## 2018-07-02 ENCOUNTER — Other Ambulatory Visit: Payer: Self-pay | Admitting: Cardiovascular Disease

## 2018-07-02 NOTE — Telephone Encounter (Signed)
Patient contacted because edarbi 40 mg was removed from his list at discharge on 02/19/18 after an EGD procedure. Patient confirmed that he has been taking edarbi since it was prescribed by Dr. Bronson Ing and approved by insurance in April 2019. Refill sent to pharmacy.

## 2018-09-04 ENCOUNTER — Telehealth: Payer: Self-pay | Admitting: *Deleted

## 2018-09-04 NOTE — Telephone Encounter (Signed)
Received fax from [place] tab 40mg  approved for 12 months through 09/03/2019.

## 2018-09-10 ENCOUNTER — Telehealth: Payer: Self-pay | Admitting: Cardiovascular Disease

## 2018-09-10 NOTE — Telephone Encounter (Signed)
I called but was unable to reach the patient.  I left a voicemail for him about potentially rescheduling his appointment if his cardiac symptoms are stable, given the COVID19 pandemic.  I asked him to give our office a call back.

## 2018-09-11 ENCOUNTER — Telehealth: Payer: Self-pay | Admitting: *Deleted

## 2018-09-11 ENCOUNTER — Other Ambulatory Visit: Payer: Self-pay | Admitting: Nurse Practitioner

## 2018-09-11 NOTE — Telephone Encounter (Signed)
Pt contacted per Dr Bronson Ing. History reviewed. No symptoms to suggest any unstable cardiac conditions. Based on discussion, with current pandemic situation, we have postponed 09/14/18 appointment until 11/17/2018. If symptoms change, pt has been instructed to contact our office

## 2018-09-11 NOTE — Telephone Encounter (Signed)
Patient has been rescheduled for 11/20/2018.

## 2018-09-14 ENCOUNTER — Ambulatory Visit: Payer: Commercial Managed Care - PPO | Admitting: Cardiovascular Disease

## 2018-09-23 ENCOUNTER — Ambulatory Visit: Payer: Commercial Managed Care - PPO | Admitting: Neurology

## 2018-09-23 ENCOUNTER — Other Ambulatory Visit: Payer: Self-pay

## 2018-09-23 ENCOUNTER — Encounter: Payer: Self-pay | Admitting: Neurology

## 2018-09-23 VITALS — Ht 75.0 in | Wt 323.5 lb

## 2018-09-23 DIAGNOSIS — R42 Dizziness and giddiness: Secondary | ICD-10-CM

## 2018-09-23 DIAGNOSIS — R110 Nausea: Secondary | ICD-10-CM | POA: Diagnosis not present

## 2018-09-23 DIAGNOSIS — E1142 Type 2 diabetes mellitus with diabetic polyneuropathy: Secondary | ICD-10-CM | POA: Diagnosis not present

## 2018-09-23 MED ORDER — ACETAZOLAMIDE 250 MG PO TABS: 250.0000 mg | ORAL_TABLET | Freq: Three times a day (TID) | ORAL | 1 refills | Status: DC

## 2018-09-23 NOTE — Progress Notes (Signed)
[facility]  PATIENT: Jerome Shepherd DOB: [birth year]  REFERRING DOCTOR OR PCP:  Dr. Woody Seller SOURCE: patient, notes from PCP, imaging and lab reports, MRI images personally reviewed  _________________________________   HISTORICAL  CHIEF COMPLAINT:  Chief Complaint  Patient presents with  . New Patient (Initial Visit)    RM 13, alone. Paper referral from Dr. Woody Seller for dizziness. Sx started around January/Feb. Feels "wobbly". 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 Denies any injuries.     HISTORY OF PRESENT ILLNESS:  I had the pleasure of seeing your patient, Jerome Shepherd, at [facility] for neurologic consultation regarding his dizziness.   He is a 53 year old man who began to experience these symptoms in late January.  He notes that he is wobbly and off balance.   He does not note vertigo but has a twitching sensation with lightheadedness.  No actual vertigo.  When his eyes are closed, he feels he is still moving.    Changes in position can worsen the symptoms,especially moving his head quick and he gets mild nausea at that time but no vomiting.    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   He has been out of work the last 6 weeks.   One time when accelerating in a friend's old [place] with a very large racing engine he felt his vision blacked out a few seconds and he had nausea.      He does not note a change in hearing but generally the left ear hears a little better than his right for a long time.   He has noted a mild right ear discharge.   In the morning, as soon as he gets up he takes his med's and then takes a shower.   He notes that he has had some white crust in the right ear.   He denies any fevers, ear pain, headaches.  He had an MRI of the brain performed 09/15/2018.  It was read as "no significant abnormality for  age.  Small chronic hyperintensities right frontal white matter otherwise normal".  I personally reviewed the images and concur with that interpretation.  Additionally there is no significant sinusitis and no mastoid effusion.  The internal auditory canals appear normal.  He has NIDDM and HTN.   He has had angina for the past decade and was told he had a small MI.   He also has esophageal spasms and gets the same sensation.   He has some hand arthritis.      MRI cervical spine shows a left paracentral protrusion with foraminal narrowing.    REVIEW OF SYSTEMS: Constitutional: No fevers, chills, sweats, or change in appetite Eyes: No visual changes, double vision, eye pain Ear, nose and throat: No hearing loss, ear pain, nasal congestion, sore throat Cardiovascular: No chest pain, palpitations Respiratory: No shortness of breath at rest or with exertion.   No wheezes GastrointestinaI: No nausea, vomiting, diarrhea, abdominal pain, fecal incontinence Genitourinary: No dysuria, urinary retention or frequency.  No nocturia. Musculoskeletal: No neck pain, back pain Integumentary: No rash, pruritus, skin lesions Neurological: as above Psychiatric: No depression at this time.  No anxiety Endocrine: No palpitations, diaphoresis, change in appetite, change in weigh or increased thirst Hematologic/Lymphatic: No anemia, purpura, petechiae. Allergic/Immunologic: No itchy/runny eyes, nasal congestion,  recent allergic reactions, rashes  ALLERGIES: Allergies  Allergen Reactions  . Codeine Nausea Only  . Sulfa Antibiotics Nausea Only    HOME MEDICATIONS:  Current Outpatient Medications:  .  amLODipine (NORVASC) 10 MG tablet, TAKE ONE TABLET BY MOUTH EVERY DAY PATIENT NEEDS OFFICE VISIT (Patient taking differently: Take 10 mg by mouth daily. ), Disp: 7 tablet, Rfl: 0 .  Ascorbic Acid (VITAMIN C) 1000 MG tablet, Take 1,000 mg by mouth daily as needed (for cold like symptoms). , Disp: , Rfl:  .   aspirin EC 81 MG tablet, Take 1 tablet (81 mg total) by mouth daily., Disp: , Rfl:  .  EDARBI 40 MG TABS, TAKE ONE TABLET BY MOUTH DAILY., Disp: 30 tablet, Rfl: 6 .  esomeprazole (NEXIUM) 40 MG capsule, TAKE ONE CAPSULE BY MOUTH TWICE DAILY BEFORE A MEAL, Disp: 60 capsule, Rfl: 5 .  glimepiride (AMARYL) 4 MG tablet, Take 4 mg by mouth 2 (two) times daily. , Disp: , Rfl: 3 .  isosorbide mononitrate (IMDUR) 60 MG 24 hr tablet, TAKE ONE TABLET BY MOUTH EVERY DAY PATIENT NEEDS OFFICE VISIT (Patient taking differently: Take 60 mg by mouth daily. ), Disp: 7 tablet, Rfl: 0 .  metFORMIN (GLUCOPHAGE) 500 MG tablet, Take 500 mg by mouth 2 (two) times daily. , Disp: , Rfl:  .  nitroGLYCERIN (NITROSTAT) 0.4 MG SL tablet, Place 1 tablet (0.4 mg total) under the tongue every 5 (five) minutes as needed for chest pain., Disp: 25 tablet, Rfl: 3 .  simvastatin (ZOCOR) 40 MG tablet, TAKE ONE TABLET BY MOUTH EVERY EVENING (Patient taking differently: Take 40 mg by mouth at bedtime. ), Disp: 7 tablet, Rfl: 0 .  sitaGLIPtin (JANUVIA) 100 MG tablet, Take 100 mg by mouth 2 (two) times daily., Disp: , Rfl:  .  VITAMIN D PO, Take 1 Dose by mouth daily., Disp: , Rfl:  .  acetaZOLAMIDE (DIAMOX) 250 MG tablet, Take 1 tablet (250 mg total) by mouth 3 (three) times daily., Disp: 90 tablet, Rfl: 1 .  Magnesium 500 MG TABS, Take 500 mg by mouth daily as needed (for leg cramps). , Disp: , Rfl:  .  meclizine (ANTIVERT) 25 MG tablet, Take 25 mg by mouth 3 (three) times daily as needed for dizziness. , Disp: , Rfl: 0 .  metoCLOPramide (REGLAN) 10 MG tablet, Take 10 mg by mouth 4 (four) times daily as needed for nausea or vomiting. , Disp: , Rfl: 0  PAST MEDICAL HISTORY: Past Medical History:  Diagnosis Date  . 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 Arthritis   . CAD (coronary artery disease), minimal, non obstructive 2013 01/01/2013  . Complication of anesthesia   . Constipation   .  Coronary artery spasm ([place]) 2013  . Diabetes mellitus   . GERD (gastroesophageal reflux disease)   . Hyperlipidemia   . Hypertension   . Myocardial infarction ([place])    2012  . PONV (postoperative nausea and vomiting)     PAST SURGICAL HISTORY: Past Surgical History:  Procedure Laterality Date  . BIOPSY  02/19/2018   Procedure: BIOPSY;  Surgeon: Daneil Dolin, MD;  [facility];  Service: Endoscopy;;  esophagus  . CARDIAC CATHETERIZATION  2013   non obstructive CAD  ~20%  . CHOLECYSTECTOMY N/A 04/01/2014   Procedure: LAPAROSCOPIC CHOLECYSTECTOMY;  Surgeon: Jamesetta So, MD;  Location: [facility];  Service: General;  Laterality: N/A;  . COLONOSCOPY  WITH PROPOFOL N/A 08/22/2016   Procedure: COLONOSCOPY WITH PROPOFOL;  Surgeon: Daneil Dolin, MD;  [facility];  Service: Endoscopy;  Laterality: N/A;  730   . ESOPHAGOGASTRODUODENOSCOPY N/A 01/10/2014   Dr. Gala Romney: antral erosions, path with chronic inactive gastritis. Empiric dilation with 56-F dilation  . ESOPHAGOGASTRODUODENOSCOPY (EGD) WITH PROPOFOL N/A 02/16/2016   Dr. Gala Romney: small hiatal hernia, normal esopagus s/p empiric dilation  . ESOPHAGOGASTRODUODENOSCOPY (EGD) WITH PROPOFOL N/A 02/19/2018   Procedure: ESOPHAGOGASTRODUODENOSCOPY (EGD) WITH PROPOFOL;  Surgeon: Daneil Dolin, MD;  [facility];  Service: Endoscopy;  Laterality: N/A;  10:15am  . KNEE ARTHROSCOPY Right 2002  . LEFT HEART CATH N/A 08/18/2011   Procedure: LEFT HEART CATH;  Surgeon: Lorretta Harp, MD;  [facility];  Service: Cardiovascular;  Laterality: N/A;  . LIVER BIOPSY N/A 04/01/2014   mild fibrosis  . MALONEY DILATION N/A 01/10/2014   Procedure: Venia Minks DILATION;  Surgeon: Daneil Dolin, MD;  [facility];  Service: Endoscopy;  Laterality: N/A;  Venia Minks DILATION N/A 02/16/2016   Procedure: Venia Minks DILATION;  Surgeon: Daneil Dolin, MD;  [facility];  Service: Endoscopy;  Laterality: N/A;  .  Venia Minks DILATION N/A 02/19/2018   Procedure: Venia Minks DILATION;  Surgeon: Daneil Dolin, MD;  [facility];  Service: Endoscopy;  Laterality: N/A;  . POLYPECTOMY  08/22/2016   Procedure: POLYPECTOMY;  Surgeon: Daneil Dolin, MD;  [facility];  Service: Endoscopy;;  . Azzie Almas DILATION N/A 01/10/2014   Procedure: Azzie Almas DILATION;  Surgeon: Daneil Dolin, MD;  [facility];  Service: Endoscopy;  Laterality: N/A;    FAMILY HISTORY: Family History  Problem Relation Age of Onset  . Diabetes type II Sister   . Heart attack Cousin   . Colon cancer Neg Hx   . Stomach cancer Neg Hx   . Esophageal cancer Neg Hx     SOCIAL HISTORY:  Social History   Socioeconomic History  . Marital status: Single    Spouse name: Not on file  . Number of children: Not on file  . Years of education: Not on file  . Highest education level: Not on file  Occupational History  . Occupation: truck Education administrator: [employer]  . Financial resource strain: Not on file  . Food insecurity:    Worry: Not on file    Inability: Not on file  . Transportation needs:    Medical: Not on file    Non-medical: Not on file  Tobacco Use  . Smoking status: Former Smoker    Packs/day: 1.50    Years: 15.00    Pack years: 22.50    Types: Cigarettes    Last attempt to quit: 08/18/1995    Years since quitting: 23.1  . Smokeless tobacco: Never Used  . Tobacco comment: Quit smoking x 20 years  Substance and Sexual Activity  . Alcohol use: No    Alcohol/week: 0.0 standard drinks  . Drug use: No  . Sexual activity: Not Currently  Lifestyle  . Physical activity:    Days per week: Not on file    Minutes per session: Not on file  . Stress: Not on file  Relationships  . Social connections:    Talks on phone: Not on file    Gets together: Not on file    Attends religious service: Not on file    Active member of  club or organization: Not on file    Attends meetings  of clubs or organizations: Not on file    Relationship status: Not on file  . Intimate partner violence:    Fear of current or ex partner: Not on file    Emotionally abused: Not on file    Physically abused: Not on file    Forced sexual activity: Not on file  Other Topics Concern  . Not on file  Social History Narrative   Lives alone   Caffeine use: tea daily, no soda    Truck diver   Right handed    Divorced, no children, works as a Production designer, theatre/television/film.     PHYSICAL EXAM  Vitals:   09/23/18 0839 09/23/18 0840 09/23/18 0844  SpO2: 97% 96% 97%  Weight: (!) 323 lb 8 oz (146.7 kg)    Height: 6' 3"  (1.905 m)      Body mass index is 40.43 kg/m.   General: The patient is well-developed and well-nourished and in no acute distress.    HEENT:  Funduscopic exam shows normal optic discs and retinal vessels.  Tympanic membranes were intact.  He has a small amount of cerumen in both ear canals but no blockage.   Pharynx is Mallampati 2.    Neck: The neck is supple, no carotid bruits are noted.  The neck is nontender.  Cardiovascular: The heart has a regular rate and rhythm with a normal S1 and S2. There were no murmurs, gallops or rubs.  .  Skin: Extremities are without rash or edema.  Musculoskeletal:  Back is nontender  Neurologic Exam  Mental status: The patient is alert and oriented x 3 at the time of the examination. The patient has apparent normal recent and remote memory, with an apparently normal attention span and concentration ability.   Speech is normal.  Cranial nerves: Extraocular movements are full. Pupils are equal, round, and reactive to light and accomodation.  Visual fields are full.  Facial symmetry is present. There is good facial sensation to soft touch bilaterally.Facial strength is normal.  Trapezius and sternocleidomastoid strength is normal. No dysarthria is noted.  The tongue is midline, and the patient has symmetric elevation of the soft palate. No  obvious hearing deficits are noted.  Weber test did not lateralize  Motor:  Muscle bulk is normal.   Tone is normal. Strength is  5 / 5 in all 4 extremities.   Sensory: Sensory testing is intact to pinprick, soft touch and vibration sensation in all 4 extremities.  Coordination: Cerebellar testing reveals good finger-nose-finger and heel-to-shin bilaterally.  Gait and station: Station is normal.   Gait is normal. Tandem gait is normal. Romberg is negative.   Reflexes: Deep tendon reflexes are symmetric and normal bilaterally.   Plantar responses are flexor.    DIAGNOSTIC DATA (LABS, IMAGING, TESTING) - I reviewed patient records, labs, notes, testing and imaging myself where available.  Lab Results  Component Value Date   WBC 10.0 08/19/2016   HGB 16.0 08/19/2016   HCT 44.5 08/19/2016   MCV 91.2 08/19/2016   PLT 236 08/19/2016      Component Value Date/Time   NA 138 08/19/2016 1338   K 3.9 08/19/2016 1338   CL 103 08/19/2016 1338   CO2 25 08/19/2016 1338   GLUCOSE 122 (H) 08/19/2016 1338   BUN 16 08/19/2016 1338   CREATININE 1.04 08/19/2016 1338   CALCIUM 9.6 08/19/2016 1338   PROT 6.1 (L) 03/12/2016 0403  ALBUMIN 3.5 03/12/2016 0403   AST 30 03/12/2016 0403   ALT 19 03/12/2016 0403   ALKPHOS 49 03/12/2016 0403   BILITOT 0.8 03/12/2016 0403   GFRNONAA >60 08/19/2016 1338   GFRAA >60 08/19/2016 1338   Lab Results  Component Value Date   CHOL 104 03/14/2016   HDL 26 (L) 03/14/2016   LDLCALC 20 03/14/2016   TRIG 291 (H) 03/14/2016   CHOLHDL 4.0 03/14/2016   Lab Results  Component Value Date   HGBA1C 7.7 (H) 03/14/2016   No results found for: VITAMINB12 Lab Results  Component Value Date   TSH 3.910 01/16/2014     ASSESSMENT AND PLAN  Vertigo  Nausea without vomiting  Controlled type 2 diabetes mellitus with diabetic polyneuropathy, without long-term current use of insulin ([place])   In summary, Jerome Shepherd is a 53 year old man with vertigo that can  occur at rest may be worse with movements.    The etiology is uncertain.   The MRI of the brain was normal.  Additionally the internal auditory canals and mastoids appeared normal.  Hearing appeared to be normal and symmetric.  I discussed with him that I do not have an explanation for his symptoms but some forms of vertigo can improve with various medications.  He did not get any benefit from meclizine.  I will have him try Diamox to see if that helps more.  If he does not improve after couple weeks of Diamox, consider referral to ENT and possible vestibular testing.  He will return to see me as needed based on his response.  He should call if he notes new or worsening neurologic symptoms..  Thank you for asking me to see Jerome Shepherd.  Please let me know if I can be of further assistance with him or other patients in the future.  Richard A. Felecia Shelling, MD, [facility] 10/28/4933, 5:21 PM Certified in Neurology, Clinical Neurophysiology, Sleep Medicine, Pain Medicine and Neuroimaging  [facility] [address], [place], [place] [address] [phone]

## 2018-09-28 ENCOUNTER — Telehealth: Payer: Self-pay | Admitting: Neurology

## 2018-09-28 MED ORDER — LAMOTRIGINE 100 MG PO TABS: ORAL_TABLET | ORAL | 5 refills | Status: DC

## 2018-09-28 NOTE — Telephone Encounter (Signed)
Dr. Sater- please advise 

## 2018-09-28 NOTE — Telephone Encounter (Signed)
Talked with Dr. Felecia Shelling. Lamotrigine does not come in 50mg  tablet as standard dose. We will send in 100mg  tablet instead with the following instructions:  Take 1/2 tablet for one week. Then 1/2 tablet twice daily for 1 week. Then 1 tablet twice daily after that. STOP IF RASH DEVELOPS. #60, 5 refills.

## 2018-09-28 NOTE — Telephone Encounter (Signed)
Pt states that the acetaZOLAMIDE (DIAMOX) 250 MG tablet is making him very dizzy and nauseated. Pt states he has had to stop taking it. Pt would like to know if there is any other medication that can be prescribed to him. Please advise.

## 2018-09-28 NOTE — Telephone Encounter (Signed)
Please send in lamotrigine 50 mg  For 5 days, take 1 pill a day Next 5 days take 1 pill twice a day Then take 1 pill tid (can also do as 1 in morning and 2 at night  If a rash develops STOP the medicine

## 2018-10-15 ENCOUNTER — Telehealth: Payer: Self-pay | Admitting: Neurology

## 2018-10-15 DIAGNOSIS — R42 Dizziness and giddiness: Secondary | ICD-10-CM

## 2018-10-15 NOTE — Telephone Encounter (Signed)
I called pt. I advised pt that Dr. Felecia Shelling recommends that pt try an ENT referral and rehab for vestibular exercises.  Pt reports that he lives in [place], [place] and would prefer for these referrals to be in [place], [place]. Pt also wants these referrals done ASAP because he will lose his job mid May if he does not feel better and can return to work. I advised him that COVID-19 may be causing delays in those offices but that I will ask our referrals dept to work with the pt. Pt verbalized understanding.

## 2018-10-15 NOTE — Telephone Encounter (Signed)
Pt has called to inform that as of last night he felt the dizziness , light headedness and the nausea return.  Pt states Dr Felecia Shelling told him if current meds didn't help he would refer pt to a ENT specialist.  Pt has been out of work and doesn't want to lose his job behind this.  Please call

## 2018-10-15 NOTE — Telephone Encounter (Signed)
Yes, thank you.

## 2018-10-15 NOTE — Addendum Note (Signed)
Addended by: Lester Morenci A on: 10/15/2018 02:15 PM   Modules accepted: Orders

## 2018-10-28 NOTE — Telephone Encounter (Signed)
Patient is scheduled with Dr. Wilburn Cornelia.

## 2018-11-02 DIAGNOSIS — R42 Dizziness and giddiness: Secondary | ICD-10-CM | POA: Insufficient documentation

## 2018-11-13 ENCOUNTER — Telehealth: Payer: Self-pay | Admitting: Cardiovascular Disease

## 2018-11-13 NOTE — Telephone Encounter (Signed)
Please give the patient a call, as he has called as well and spoken w/ [place] about this   [phone]

## 2018-11-13 NOTE — Telephone Encounter (Signed)
Call returned to patient - stated he has changed insurance to [facility] as of 3 weeks ago.  He will bring copy of new card to office this evening.  New approval can be done at that time.

## 2018-11-13 NOTE — Telephone Encounter (Signed)
Mariann Laster called from [facility] stating that the prior auth for the pt's EDARBI 40 MG TABS [[id number]]  Has been denied, they have faxed the notice as well.

## 2018-11-17 ENCOUNTER — Telehealth: Payer: Self-pay | Admitting: *Deleted

## 2018-11-17 NOTE — Telephone Encounter (Signed)
   Primary Cardiologist:  Kate Sable, MD   Patient contacted.  History reviewed.  No symptoms to suggest any unstable cardiac conditions.  Based on discussion, with current pandemic situation, we will be postponing this appointment for Jerome Shepherd with a plan for f/u in August 2020 or sooner if feasible/necessary.  If symptoms change, he has been instructed to contact our office.    Marlou Sa, RN  11/17/2018 4:14 PM         .

## 2018-11-18 ENCOUNTER — Telehealth: Payer: Self-pay | Admitting: *Deleted

## 2018-11-18 NOTE — Telephone Encounter (Signed)
Edarbi 63m daily - new prior authorization sent through cover my meds.  Waiting on approval.

## 2018-11-20 ENCOUNTER — Ambulatory Visit: Payer: Commercial Managed Care - PPO | Admitting: Cardiovascular Disease

## 2018-11-20 ENCOUNTER — Telehealth: Payer: Self-pay | Admitting: Cardiovascular Disease

## 2018-11-20 NOTE — Telephone Encounter (Signed)
Addressed if first telephone note.

## 2018-11-20 NOTE — Telephone Encounter (Signed)
Per "cover my med" - request was cancelled on 11/18/2018.

## 2018-11-20 NOTE — Telephone Encounter (Signed)
Call placed to [facility] sent on 11/12/2018 by L. P[employer] - denied.    Cost would be $50.00 co-pay monthly through [employer] if run with no insurance.   Left message to return call.

## 2018-11-20 NOTE — Telephone Encounter (Signed)
Patient called to ask about [employer]. Saw that Prior Authorization was sent 5/26. Can someone check on this please.

## 2018-11-24 NOTE — Telephone Encounter (Signed)
Called pt. No answer. Left message for pt to return call.  

## 2018-11-25 NOTE — Telephone Encounter (Signed)
Spoke with patient.  Will see if [employer] still honors the commercial co-pay cards.  Left message with Vaughan Basta to return call.

## 2018-11-25 NOTE — Telephone Encounter (Signed)
Spoke with Jerome Shepherd at [employer] earlier.  Stated the only way he could get the $10 copay was if his insurance covered the medication.  Jerome Shepherd is not covered on his new insurance plan.  I did review chart again & the only medication that he failed was Lisinopril.  His insurance requires tried & failed 2 medications before they will cover.  Please advise on what new medication he could take in place of the [place].

## 2018-11-26 MED ORDER — LOSARTAN POTASSIUM 50 MG PO TABS: 50.0000 mg | ORAL_TABLET | Freq: Every day | ORAL | 6 refills | Status: DC

## 2018-11-26 NOTE — Telephone Encounter (Signed)
Patient notified.  Will send new prescription to [facility] at his request.

## 2018-11-26 NOTE — Telephone Encounter (Signed)
Try losartan 50 mg daily.

## 2019-02-15 ENCOUNTER — Telehealth: Payer: Self-pay | Admitting: Cardiovascular Disease

## 2019-02-15 NOTE — Telephone Encounter (Signed)
Virtual Visit Pre-Appointment Phone Call  "(Name), I am calling you today to discuss your upcoming appointment. We are currently trying to limit exposure to the virus that causes COVID-19 by seeing patients at home rather than in the office."  1. "What is the BEST phone number to call the day of the visit?" - include this in appointment notes  2. Do you have or have access to (through a family member/friend) a smartphone with video capability that we can use for your visit?" a. If yes - list this number in appt notes as cell (if different from BEST phone #) and list the appointment type as a VIDEO visit in appointment notes b. If no - list the appointment type as a PHONE visit in appointment notes  3. Confirm consent - "In the setting of the current Covid19 crisis, you are scheduled for a (phone or video) visit with your provider on (date) at (time).  Just as we do with many in-office visits, in order for you to participate in this visit, we must obtain consent.  If you'd like, I can send this to your mychart (if signed up) or email for you to review.  Otherwise, I can obtain your verbal consent now.  All virtual visits are billed to your insurance company just like a normal visit would be.  By agreeing to a virtual visit, we'd like you to understand that the technology does not allow for your provider to perform an examination, and thus may limit your provider's ability to fully assess your condition. If your provider identifies any concerns that need to be evaluated in person, we will make arrangements to do so.  Finally, though the technology is pretty good, we cannot assure that it will always work on either your or our end, and in the setting of a video visit, we may have to convert it to a phone-only visit.  In either situation, we cannot ensure that we have a secure connection.  Are you willing to proceed?" STAFF: Did the patient verbally acknowledge consent to telehealth visit? Document  YES/NO here:yes  4. Advise patient to be prepared - "Two hours prior to your appointment, go ahead and check your blood pressure, pulse, oxygen saturation, and your weight (if you have the equipment to check those) and write them all down. When your visit starts, your provider will ask you for this information. If you have an Apple Watch or Kardia device, please plan to have heart rate information ready on the day of your appointment. Please have a pen and paper handy nearby the day of the visit as well."  5. Give patient instructions for MyChart download to smartphone OR Doximity/Doxy.me as below if video visit (depending on what platform provider is using)  6. Inform patient they will receive a phone call 15 minutes prior to their appointment time (may be from unknown caller ID) so they should be prepared to answer    TELEPHONE CALL NOTE  Jerome Shepherd has been deemed a candidate for a follow-up tele-health visit to limit community exposure during the Covid-19 pandemic. I spoke with the patient via phone to ensure availability of phone/video source, confirm preferred email & phone number, and discuss instructions and expectations.  I reminded Jerome Shepherd to be prepared with any vital sign and/or heart rhythm information that could potentially be obtained via home monitoring, at the time of his visit. I reminded Jerome Shepherd to expect a phone call prior to his  visit.  Jerome Shepherd 02/15/2019 11:46 AM   INSTRUCTIONS FOR DOWNLOADING THE MYCHART APP TO SMARTPHONE  - The patient must first make sure to have activated MyChart and know their login information - If Apple, go to [employer] and type in MyChart in the search bar and download the app. If Android, ask patient to go to [employer] and type in [place] in the search bar and download the app. The app is free but as with any other app downloads, their phone may require them to verify saved payment information or Apple/Android  password.  - The patient will need to then log into the app with their MyChart username and password, and select [facility] as their healthcare provider to link the account. When it is time for your visit, go to the MyChart app, find appointments, and click Begin Video Visit. Be sure to Select Allow for your device to access the Microphone and Camera for your visit. You will then be connected, and your provider will be with you shortly.  **If they have any issues connecting, or need assistance please contact MyChart service desk [phone])**  **If using a computer, in order to ensure the best quality for their visit they will need to use either of the following Internet Browsers: [employer], or Google Chrome**  IF USING DOXIMITY or DOXY.ME - The patient will receive a link just prior to their visit by text.     FULL LENGTH CONSENT FOR TELE-HEALTH VISIT   I hereby voluntarily request, consent and authorize [facility] and its employed or contracted physicians, physician assistants, nurse practitioners or other licensed health care professionals (the Practitioner), to provide me with telemedicine health care services (the Services") as deemed necessary by the treating Practitioner. I acknowledge and consent to receive the Services by the Practitioner via telemedicine. I understand that the telemedicine visit will involve communicating with the Practitioner through live audiovisual communication technology and the disclosure of certain medical information by electronic transmission. I acknowledge that I have been given the opportunity to request an in-person assessment or other available alternative prior to the telemedicine visit and am voluntarily participating in the telemedicine visit.  I understand that I have the right to withhold or withdraw my consent to the use of telemedicine in the course of my care at any time, without affecting my right to future care or treatment,  and that the Practitioner or I may terminate the telemedicine visit at any time. I understand that I have the right to inspect all information obtained and/or recorded in the course of the telemedicine visit and may receive copies of available information for a reasonable fee.  I understand that some of the potential risks of receiving the Services via telemedicine include:   Delay or interruption in medical evaluation due to technological equipment failure or disruption;  Information transmitted may not be sufficient (e.g. poor resolution of images) to allow for appropriate medical decision making by the Practitioner; and/or   In rare instances, security protocols could fail, causing a breach of personal health information.  Furthermore, I acknowledge that it is my responsibility to provide information about my medical history, conditions and care that is complete and accurate to the best of my ability. I acknowledge that Practitioner's advice, recommendations, and/or decision may be based on factors not within their control, such as incomplete or inaccurate data provided by me or distortions of diagnostic images or specimens that may result from electronic transmissions. I understand  that the practice of medicine is not an exact science and that Practitioner makes no warranties or guarantees regarding treatment outcomes. I acknowledge that I will receive a copy of this consent concurrently upon execution via email to the email address I last provided but may also request a printed copy by calling the office of [facility].    I understand that my insurance will be billed for this visit.   I have read or had this consent read to me.  I understand the contents of this consent, which adequately explains the benefits and risks of the Services being provided via telemedicine.   I have been provided ample opportunity to ask questions regarding this consent and the Services and have had my questions  answered to my satisfaction.  I give my informed consent for the services to be provided through the use of telemedicine in my medical care  By participating in this telemedicine visit I agree to the above.

## 2019-02-18 ENCOUNTER — Telehealth (INDEPENDENT_AMBULATORY_CARE_PROVIDER_SITE_OTHER): Payer: BC Managed Care – PPO | Admitting: Cardiovascular Disease

## 2019-02-18 ENCOUNTER — Encounter: Payer: Self-pay | Admitting: Cardiovascular Disease

## 2019-02-18 VITALS — BP 142/80 | Ht 75.0 in | Wt 315.0 lb

## 2019-02-18 DIAGNOSIS — I201 Angina pectoris with documented spasm: Secondary | ICD-10-CM

## 2019-02-18 DIAGNOSIS — I2510 Atherosclerotic heart disease of native coronary artery without angina pectoris: Secondary | ICD-10-CM | POA: Diagnosis not present

## 2019-02-18 DIAGNOSIS — I2583 Coronary atherosclerosis due to lipid rich plaque: Secondary | ICD-10-CM

## 2019-02-18 DIAGNOSIS — E66812 Obesity, class 2: Secondary | ICD-10-CM

## 2019-02-18 DIAGNOSIS — I10 Essential (primary) hypertension: Secondary | ICD-10-CM

## 2019-02-18 DIAGNOSIS — E7800 Pure hypercholesterolemia, unspecified: Secondary | ICD-10-CM

## 2019-02-18 DIAGNOSIS — Z6838 Body mass index (BMI) 38.0-38.9, adult: Secondary | ICD-10-CM

## 2019-02-18 DIAGNOSIS — G4733 Obstructive sleep apnea (adult) (pediatric): Secondary | ICD-10-CM

## 2019-02-18 NOTE — Patient Instructions (Signed)

## 2019-02-18 NOTE — Progress Notes (Signed)
Virtual Visit via Telephone Note   This visit type was conducted due to national recommendations for restrictions regarding the COVID-19 Pandemic (e.g. social distancing) in an effort to limit this patient's exposure and mitigate transmission in our community.  Due to his co-morbid illnesses, this patient is at least at moderate risk for complications without adequate follow up.  This format is felt to be most appropriate for this patient at this time.  The patient did not have access to video technology/had technical difficulties with video requiring transitioning to audio format only (telephone).  All issues noted in this document were discussed and addressed.  No physical exam could be performed with this format.  Please refer to the patient's chart for his  consent to telehealth for [facility].   Date:  02/18/2019   ID:  Jerome Shepherd, DOB 08/16/1965, MRN [id number]  Patient Location: Home Provider Location: Office  PCP:  Glenda Chroman, MD  Cardiologist:  Kate Sable, MD  Electrophysiologist:  None   Evaluation Performed:  Follow-Up Visit  Chief Complaint:  CAD  History of Present Illness:    Jerome Shepherd is a 53 y.o. 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 He is morbidly obese.  He underwent a normal nuclear stress test on 03/05/17, LVEF 56%.  He has been out of work for 2 months because he had some issues with dizziness. He was diagnosed with vertigo and was given some exercises and symptoms have resolved.  The patient denies any symptoms of chest pain, palpitations, shortness of breath, lightheadedness, dizziness, leg swelling, orthopnea, PND, and syncope.  He just started walking an hour daily as his A1C was elevated (9.6%).  The patient does not have symptoms concerning for COVID-19 infection (fever, chills, cough, or new shortness of breath).    Past Medical History:  Diagnosis Date  . Antral erosion  01/17/2014   Per EGD 01/10/14- not likely the cause of his symtoms   . Arthritis   . CAD (coronary artery disease), minimal, non obstructive 2013 01/01/2013  . Complication of anesthesia   . Constipation   . Coronary artery spasm ([place]) 2013  . Diabetes mellitus   . GERD (gastroesophageal reflux disease)   . Hyperlipidemia   . Hypertension   . Myocardial infarction ([place])    2012  . PONV (postoperative nausea and vomiting)    Past Surgical History:  Procedure Laterality Date  . BIOPSY  02/19/2018   Procedure: BIOPSY;  Surgeon: Daneil Dolin, MD;  [facility];  Service: Endoscopy;;  esophagus  . CARDIAC CATHETERIZATION  2013   non obstructive CAD  ~20%  . CHOLECYSTECTOMY N/A 04/01/2014   Procedure: LAPAROSCOPIC CHOLECYSTECTOMY;  Surgeon: Jamesetta So, MD;  Location: [facility] ORS;  Service: General;  Laterality: N/A;  . COLONOSCOPY WITH PROPOFOL N/A 08/22/2016   Procedure: COLONOSCOPY WITH PROPOFOL;  Surgeon: Daneil Dolin, MD;  [facility];  Service: Endoscopy;  Laterality: N/A;  730   . ESOPHAGOGASTRODUODENOSCOPY N/A 01/10/2014   Dr. Gala Romney: antral erosions, path with chronic inactive gastritis. Empiric dilation with 56-F dilation  . ESOPHAGOGASTRODUODENOSCOPY (EGD) WITH PROPOFOL N/A 02/16/2016   Dr. Gala Romney: small hiatal hernia, normal esopagus s/p empiric dilation  . ESOPHAGOGASTRODUODENOSCOPY (EGD) WITH PROPOFOL N/A 02/19/2018   Procedure: ESOPHAGOGASTRODUODENOSCOPY (EGD) WITH PROPOFOL;  Surgeon: Daneil Dolin, MD;  [facility];  Service: Endoscopy;  Laterality: N/A;  10:15am  . KNEE ARTHROSCOPY Right 2002  . LEFT  HEART CATH N/A 08/18/2011   Procedure: LEFT HEART CATH;  Surgeon: Lorretta Harp, MD;  Location: [facility] CATH LAB;  Service: Cardiovascular;  Laterality: N/A;  . LIVER BIOPSY N/A 04/01/2014   mild fibrosis  . MALONEY DILATION N/A 01/10/2014   Procedure: Venia Minks DILATION;  Surgeon: Daneil Dolin, MD;  [facility];  Service: Endoscopy;   Laterality: N/A;  Venia Minks DILATION N/A 02/16/2016   Procedure: Venia Minks DILATION;  Surgeon: Daneil Dolin, MD;  [facility];  Service: Endoscopy;  Laterality: N/A;  . Venia Minks DILATION N/A 02/19/2018   Procedure: Venia Minks DILATION;  Surgeon: Daneil Dolin, MD;  [facility];  Service: Endoscopy;  Laterality: N/A;  . POLYPECTOMY  08/22/2016   Procedure: POLYPECTOMY;  Surgeon: Daneil Dolin, MD;  [facility];  Service: Endoscopy;;  . Azzie Almas DILATION N/A 01/10/2014   Procedure: Azzie Almas DILATION;  Surgeon: Daneil Dolin, MD;  [facility];  Service: Endoscopy;  Laterality: N/A;     Current Meds  Medication Sig  . amLODipine (NORVASC) 10 MG tablet TAKE ONE TABLET BY MOUTH EVERY DAY PATIENT NEEDS OFFICE VISIT (Patient taking differently: Take 10 mg by mouth daily. )  . Ascorbic Acid (VITAMIN C) 1000 MG tablet Take 1,000 mg by mouth daily as needed (for cold like symptoms).   Marland Kitchen aspirin EC 81 MG tablet Take 1 tablet (81 mg total) by mouth daily.  . dapagliflozin propanediol (FARXIGA) 5 MG TABS tablet Take 5 mg by mouth daily.  Marland Kitchen esomeprazole (NEXIUM) 40 MG capsule TAKE ONE CAPSULE BY MOUTH TWICE DAILY BEFORE A MEAL  . glimepiride (AMARYL) 4 MG tablet Take 4 mg by mouth 2 (two) times daily.   . isosorbide mononitrate (IMDUR) 60 MG 24 hr tablet TAKE ONE TABLET BY MOUTH EVERY DAY PATIENT NEEDS OFFICE VISIT (Patient taking differently: Take 60 mg by mouth daily. )  . losartan (COZAAR) 50 MG tablet Take 1 tablet (50 mg total) by mouth daily.  . Magnesium 500 MG TABS Take 500 mg by mouth daily as needed (for leg cramps).   . metFORMIN (GLUCOPHAGE) 500 MG tablet Take 500 mg by mouth 2 (two) times daily.   . nitroGLYCERIN (NITROSTAT) 0.4 MG SL tablet Place 1 tablet (0.4 mg total) under the tongue every 5 (five) minutes as needed for chest pain.  . simvastatin (ZOCOR) 40 MG tablet TAKE ONE TABLET BY MOUTH EVERY EVENING (Patient taking differently: Take 40 mg by  mouth at bedtime. )  . VITAMIN D PO Take 1 Dose by mouth daily.     Allergies:   Codeine, Diamox [acetazolamide], and Sulfa antibiotics   Social History   Tobacco Use  . Smoking status: Former Smoker    Packs/day: 1.50    Years: 15.00    Pack years: 22.50    Types: Cigarettes    Quit date: 08/18/1995    Years since quitting: 23.5  . Smokeless tobacco: Never Used  . Tobacco comment: Quit smoking x 20 years  Substance Use Topics  . Alcohol use: No    Alcohol/week: 0.0 standard drinks  . Drug use: No     Family Hx: The patient's family history includes Diabetes type II in his sister; Heart attack in his cousin. There is no history of Colon cancer, Stomach cancer, or Esophageal cancer.  ROS:   Please see the history of present illness.     All other systems reviewed and are negative.   Prior CV studies:  The following studies were reviewed today:  Cardiac cath 08/18/11:  HEMODYNAMIC DATA:  Central aortic pressure was 129/78.  Left ventricular pressure 129/9, post A-wave 16.  ANGIOGRAPHIC DATA:  Left main coronary artery was a long normal vessel, which had some very smooth, mild distal taper prior to trifurcating into an LAD, intermediate, and circumflex system.  The LAD had luminal irregularities of 20-30% proximally in the region of the 1st septal and diagonal vessel.  In the mid LAD, there did appear to be an area of mild narrowing of 40%, which appeared in an area that possibly dipped intramyocardially.  There was TIMI-3 flow.  The LAD extended to and wrapped around the LV apex.  The intermediate like vessel was angiographically normal.  The circumflex vessel had mild narrowing in its mid segment prior to bifurcating into a small distal branch and a distal obtuse marginal vessel.  The right coronary artery was a large caliber dominant vessel that had luminal irregularities of 30% in the proximal to mid segment with 30% narrowing in the mid segment.  The  RCA supplied the PDA and posterolateral branch.  Following IC nitroglycerin to the LAD, the mid LAD area seemed to improve as did the circumflex area to less than 20%.  Biplane cine left ventriculography suggested left ventricular hypertrophy.  Ejection fraction was approximately 60%.  No definitive wall motion abnormality was demonstrated.  There is a questionable subtle mild abnormality apically, which was not definitive.  Distal aortography revealed widely patent renal arteries.  There was no evidence for dissection.  There was no significant aortoiliac disease.  IMPRESSION: 1. Probable acute coronary syndrome, leading to transient coronary     vasospasm, relieved with nitroglycerin. 2. Mild, currently nonobstructive coronary artery disease, with 20-30%     proximal left anterior descending stenoses, 40% mid left anterior     descending narrowing, 40% circumflex narrowing, and scattered 30%     proximal to mid right coronary artery narrowing.  The left system     narrowing were improved following IC nitroglycerin. 3. Normal left ventricular function with suggestion of left     ventricular hypertrophy without definitive wall motion abnormality.  Labs/Other Tests and Data Reviewed:    EKG:  No ECG reviewed.  Recent Labs: No results found for requested labs within last 8760 hours.   Recent Lipid Panel Lab Results  Component Value Date/Time   CHOL 104 03/14/2016 03:49 PM   TRIG 291 (H) 03/14/2016 03:49 PM   HDL 26 (L) 03/14/2016 03:49 PM   CHOLHDL 4.0 03/14/2016 03:49 PM   LDLCALC 20 03/14/2016 03:49 PM    Wt Readings from Last 3 Encounters:  02/18/19 (!) 315 lb (142.9 kg)  09/23/18 (!) 323 lb 8 oz (146.7 kg)  02/19/18 (!) 311 lb (141.1 kg)     Objective:    Vital Signs:  BP (!) 142/80   Ht 6' 3"  (1.905 m)   Wt (!) 315 lb (142.9 kg)   BMI 39.37 kg/m    VITAL SIGNS:  reviewed  ASSESSMENT & PLAN:    1. Nonobstructive coronary disease: Symptomatically  stable.  Normal nuclear stress test in September 2018 as noted above.  Continue aspirin, amlodipine, simvastatin, and Imdur 60 mg.  2. Hypertension: Blood pressure is mildly elevated.  No changes. Now walking 1 hour a day and weight loss should help. Now on losartan 50 mg as insurance wouldn't cover Edarbi.  3. Hyperlipidemia: Continue statin therapy.  4. Obstructive sleep apnea: Using mouth spacers.  He needs significant weight loss.  5. Obesity: Now walking 1 hour a day.    COVID-19 Education: The signs and symptoms of COVID-19 were discussed with the patient and how to seek care for testing (follow up with PCP or arrange E-visit).  The importance of social distancing was discussed today.  Time:   Today, I have spent 10 minutes with the patient with telehealth technology discussing the above problems.     Medication Adjustments/Labs and Tests Ordered: Current medicines are reviewed at length with the patient today.  Concerns regarding medicines are outlined above.   Tests Ordered: No orders of the defined types were placed in this encounter.   Medication Changes: No orders of the defined types were placed in this encounter.   Follow Up:  Virtual Visit or In Person in 1 year(s)  Signed, Kate Sable, MD  02/18/2019 2:56 PM    [place]

## 2019-03-08 ENCOUNTER — Other Ambulatory Visit: Payer: Self-pay | Admitting: Gastroenterology

## 2019-06-06 ENCOUNTER — Other Ambulatory Visit: Payer: Self-pay | Admitting: Cardiovascular Disease

## 2019-08-16 ENCOUNTER — Other Ambulatory Visit: Payer: Self-pay | Admitting: *Deleted

## 2019-08-16 MED ORDER — LOSARTAN POTASSIUM 50 MG PO TABS: 50.0000 mg | ORAL_TABLET | Freq: Every day | ORAL | 2 refills | Status: DC

## 2019-09-02 ENCOUNTER — Ambulatory Visit: Payer: Self-pay | Attending: Internal Medicine

## 2019-09-02 DIAGNOSIS — Z23 Encounter for immunization: Secondary | ICD-10-CM

## 2019-09-02 NOTE — Progress Notes (Signed)
   Covid-19 Vaccination Clinic  Name:  Jerome Shepherd    MRN: [id number] DOB: 09/18/1965  09/02/2019  [name] was observed post Covid-19 immunization for 15 minutes without incident. He was provided with Vaccine Information Sheet and instruction to access the V-Safe system.   [name] was instructed to call 911 with any severe reactions post vaccine: Marland Kitchen Difficulty breathing  . Swelling of face and throat  . A fast heartbeat  . A bad rash all over body  . Dizziness and weakness   Immunizations Administered    Name Date Dose VIS Date Route   Moderna COVID-19 Vaccine 09/02/2019 11:31 AM 0.5 mL 05/25/2019 Intramuscular   Manufacturer: Moderna   Lot: [id number]   [id number]

## 2019-09-13 ENCOUNTER — Other Ambulatory Visit: Payer: Self-pay | Admitting: Nurse Practitioner

## 2019-09-15 ENCOUNTER — Other Ambulatory Visit: Payer: Self-pay | Admitting: Nurse Practitioner

## 2019-10-05 ENCOUNTER — Ambulatory Visit: Payer: Self-pay | Attending: Internal Medicine

## 2019-10-05 DIAGNOSIS — Z23 Encounter for immunization: Secondary | ICD-10-CM

## 2019-10-05 NOTE — Progress Notes (Signed)
   Covid-19 Vaccination Clinic  Name:  Jerome Shepherd    MRN: [id number] DOB: [birth year]  10/05/2019  [name] was observed post Covid-19 immunization for 15 minutes without incident. He was provided with Vaccine Information Sheet and instruction to access the V-Safe system.   [name] was instructed to call 911 with any severe reactions post vaccine: Marland Kitchen Difficulty breathing  . Swelling of face and throat  . A fast heartbeat  . A bad rash all over body  . Dizziness and weakness   Immunizations Administered    Name Date Dose VIS Date Route   Moderna COVID-19 Vaccine 10/05/2019 11:22 AM 0.5 mL 05/25/2019 Intramuscular   Manufacturer: Moderna   Lot: [id number]   [place]: [phone]

## 2020-01-10 ENCOUNTER — Other Ambulatory Visit: Payer: Self-pay | Admitting: *Deleted

## 2020-01-10 MED ORDER — LOSARTAN POTASSIUM 50 MG PO TABS: 50.0000 mg | ORAL_TABLET | Freq: Every day | ORAL | 0 refills | Status: DC

## 2020-02-22 ENCOUNTER — Encounter: Payer: Self-pay | Admitting: Cardiology

## 2020-02-22 ENCOUNTER — Ambulatory Visit (INDEPENDENT_AMBULATORY_CARE_PROVIDER_SITE_OTHER): Payer: BC Managed Care – PPO | Admitting: Cardiology

## 2020-02-22 VITALS — BP 120/60 | HR 90 | Ht 75.0 in | Wt 316.8 lb

## 2020-02-22 DIAGNOSIS — I25119 Atherosclerotic heart disease of native coronary artery with unspecified angina pectoris: Secondary | ICD-10-CM | POA: Diagnosis not present

## 2020-02-22 DIAGNOSIS — E782 Mixed hyperlipidemia: Secondary | ICD-10-CM

## 2020-02-22 DIAGNOSIS — I10 Essential (primary) hypertension: Secondary | ICD-10-CM

## 2020-02-22 DIAGNOSIS — Z8679 Personal history of other diseases of the circulatory system: Secondary | ICD-10-CM

## 2020-02-22 NOTE — Patient Instructions (Addendum)

## 2020-02-22 NOTE — Progress Notes (Signed)
Cardiology Office Note  Date: 02/22/2020   ID: Jerome Shepherd, DOB May 20, 1966, MRN [id number]  PCP:  Glenda Chroman, MD  Cardiologist:  Rozann Lesches, MD Electrophysiologist:  None   Chief Complaint  Patient presents with  . Cardiac follow-up    History of Present Illness: Jerome Shepherd is a 54 y.o. male former patient of Dr. Bronson Ing now presenting to establish follow-up with me.  I reviewed his records and updated the chart.  He was last assessed via telehealth encounter in August 2020.  He presents in the midst of a DOT clearance evaluation initiated by his PCP. He drives a truck for a Pharmacist, community in [place]. From a cardiac perspective he does not describe any worsening angina symptoms on medical therapy. He has a history of ACS in the setting of coronary vasospasm back in 2013 at which time he had nonobstructive CAD by cardiac catheterization. His last Myoview in 2018 was interpreted as low risk.  He was referred by his PCP for a follow-up stress test at [facility]. He underwent an exercise Myoview on August 27, did develop abnormal ST segment changes per report but without angina symptoms. Perfusion imaging indicated a moderate region of inferior ischemia with normal LVEF at 70%, interpreted as intermediate risk overall. I discussed these results in detail with him today.  I personally reviewed his ECG today which shows sinus rhythm with decreased R wave progression, stable compared to prior tracing.   I reviewed his cardiac medications which are outlined below. He reports compliance with therapy. At baseline he has not had any change in his stamina, no palpitations or syncope.  Past Medical History:  Diagnosis Date  . 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 Arthritis   . CAD (coronary artery disease)    Nonobstructive by cardiac catheterization 2013  . Constipation   . Coronary vasospasm ([place])   . Essential  hypertension   . GERD (gastroesophageal reflux disease)   . Hyperlipidemia   . PONV (postoperative nausea and vomiting)   . Type 2 diabetes mellitus ([place])     Past Surgical History:  Procedure Laterality Date  . BIOPSY  02/19/2018   Procedure: BIOPSY;  Surgeon: Daneil Dolin, MD;  [facility];  Service: Endoscopy;;  esophagus  . CARDIAC CATHETERIZATION  2013   non obstructive CAD  ~20%  . CHOLECYSTECTOMY N/A 04/01/2014   Procedure: LAPAROSCOPIC CHOLECYSTECTOMY;  Surgeon: Jamesetta So, MD;  Location: [facility];  Service: General;  Laterality: N/A;  . COLONOSCOPY WITH PROPOFOL N/A 08/22/2016   Procedure: COLONOSCOPY WITH PROPOFOL;  Surgeon: Daneil Dolin, MD;  [facility];  Service: Endoscopy;  Laterality: N/A;  730   . ESOPHAGOGASTRODUODENOSCOPY N/A 01/10/2014   Dr. Gala Romney: antral erosions, path with chronic inactive gastritis. Empiric dilation with 56-F dilation  . ESOPHAGOGASTRODUODENOSCOPY (EGD) WITH PROPOFOL N/A 02/16/2016   Dr. Gala Romney: small hiatal hernia, normal esopagus s/p empiric dilation  . ESOPHAGOGASTRODUODENOSCOPY (EGD) WITH PROPOFOL N/A 02/19/2018   Procedure: ESOPHAGOGASTRODUODENOSCOPY (EGD) WITH PROPOFOL;  Surgeon: Daneil Dolin, MD;  [facility];  Service: Endoscopy;  Laterality: N/A;  10:15am  . KNEE ARTHROSCOPY Right 2002  . LEFT HEART CATH N/A 08/18/2011   Procedure: LEFT HEART CATH;  Surgeon: Lorretta Harp, MD;  Location: [facility] CATH LAB;  Service: Cardiovascular;  Laterality: N/A;  . LIVER BIOPSY N/A 04/01/2014   mild fibrosis  . MALONEY  DILATION N/A 01/10/2014   Procedure: Venia Minks DILATION;  Surgeon: Daneil Dolin, MD;  [facility];  Service: Endoscopy;  Laterality: N/A;  Venia Minks DILATION N/A 02/16/2016   Procedure: Venia Minks DILATION;  Surgeon: Daneil Dolin, MD;  [facility];  Service: Endoscopy;  Laterality: N/A;  . Venia Minks DILATION N/A 02/19/2018   Procedure: Venia Minks DILATION;  Surgeon: Daneil Dolin, MD;   [facility];  Service: Endoscopy;  Laterality: N/A;  . POLYPECTOMY  08/22/2016   Procedure: POLYPECTOMY;  Surgeon: Daneil Dolin, MD;  [facility];  Service: Endoscopy;;  . Azzie Almas DILATION N/A 01/10/2014   Procedure: Azzie Almas DILATION;  Surgeon: Daneil Dolin, MD;  [facility];  Service: Endoscopy;  Laterality: N/A;    Current Outpatient Medications  Medication Sig Dispense Refill  . amLODipine (NORVASC) 10 MG tablet TAKE ONE TABLET BY MOUTH EVERY DAY PATIENT NEEDS OFFICE VISIT (Patient taking differently: Take 10 mg by mouth daily. ) 7 tablet 0  . Ascorbic Acid (VITAMIN C) 1000 MG tablet Take 1,000 mg by mouth daily as needed (for cold like symptoms).     Marland Kitchen aspirin EC 81 MG tablet Take 1 tablet (81 mg total) by mouth daily.    . dapagliflozin propanediol (FARXIGA) 5 MG TABS tablet Take 5 mg by mouth daily.    Marland Kitchen esomeprazole (NEXIUM) 40 MG capsule TAKE ONE CAPSULE BY MOUTH TWICE DAILY BEFORE A MEAL 60 capsule 5  . glimepiride (AMARYL) 4 MG tablet Take 4 mg by mouth 2 (two) times daily.   3  . isosorbide mononitrate (IMDUR) 60 MG 24 hr tablet TAKE ONE TABLET BY MOUTH EVERY DAY PATIENT NEEDS OFFICE VISIT (Patient taking differently: Take 60 mg by mouth daily. ) 7 tablet 0  . losartan (COZAAR) 50 MG tablet Take 1 tablet (50 mg total) by mouth daily. 90 tablet 0  . Magnesium 500 MG TABS Take 500 mg by mouth daily as needed (for leg cramps).     . metFORMIN (GLUCOPHAGE) 500 MG tablet Take 500 mg by mouth 2 (two) times daily.     . nitroGLYCERIN (NITROSTAT) 0.4 MG SL tablet Place 1 tablet (0.4 mg total) under the tongue every 5 (five) minutes as needed for chest pain. 25 tablet 3  . simvastatin (ZOCOR) 40 MG tablet TAKE ONE TABLET BY MOUTH EVERY EVENING (Patient taking differently: Take 40 mg by mouth at bedtime. ) 7 tablet 0  . VITAMIN D PO Take 1 Dose by mouth daily.     No current facility-administered medications for this visit.   Allergies:  Codeine, Diamox  [acetazolamide], and Sulfa antibiotics   ROS:   No palpitations or syncope.  Physical Exam: VS:  BP 120/60   Pulse 90   Ht 6' 3"  (1.905 m)   Wt (!) 316 lb 12.8 oz (143.7 kg)   SpO2 98%   BMI 39.60 kg/m , BMI Body mass index is 39.6 kg/m.  Wt Readings from Last 3 Encounters:  02/22/20 (!) 316 lb 12.8 oz (143.7 kg)  02/18/19 (!) 315 lb (142.9 kg)  09/23/18 (!) 323 lb 8 oz (146.7 kg)    General: Obese male, appears comfortable at rest. HEENT: Conjunctiva and lids normal, wearing a mask. Neck: Supple, no elevated JVP or carotid bruits, no thyromegaly. Lungs: Clear to auscultation, nonlabored breathing at rest. Cardiac: Regular rate and rhythm, no S3 or significant systolic murmur, no pericardial rub. Abdomen: Soft, bowel sounds present, no guarding or rebound.  Extremities: No pitting edema, distal pulses 2+. Skin: Warm and dry. Musculoskeletal: No kyphosis. Neuropsychiatric: Alert and oriented x3, affect grossly appropriate.  ECG:  An ECG dated 02/26/2017 was personally reviewed today and demonstrated:  Sinus rhythm with decreased R wave progression.  Recent Labwork:  August 2019: Hemoglobin 15.3, platelets 241, BUN 15, creatinine 0.94, potassium 4.3, AST 39, ALT 23  Other Studies Reviewed Today:  Cardiac catheterization 08/18/2011: HEMODYNAMIC DATA: Central aortic pressure was 129/78. Left ventricular pressure 129/9, post A-wave 16.  ANGIOGRAPHIC DATA: Left main coronary artery was a long normal vessel, which had some very smooth, mild distal taper prior to trifurcating into an LAD, intermediate, and circumflex system.  The LAD had luminal irregularities of 20-30% proximally in the region of the 1st septal and diagonal vessel. In the mid LAD, there did appear to be an area of mild narrowing of 40%, which appeared in an area that possibly dipped intramyocardially. There was TIMI-3 flow. The LAD extended to and wrapped around the LV apex.  The intermediate like  vessel was angiographically normal.  The circumflex vessel had mild narrowing in its mid segment prior to bifurcating into a small distal branch and a distal obtuse marginal vessel.  The right coronary artery was a large caliber dominant vessel that had luminal irregularities of 30% in the proximal to mid segment with 30% narrowing in the mid segment. The RCA supplied the PDA and posterolateral branch.  Following IC nitroglycerin to the LAD, the mid LAD area seemed to improve as did the circumflex area to less than 20%.  Biplane cine left ventriculography suggested left ventricular hypertrophy. Ejection fraction was approximately 60%. No definitive wall motion abnormality was demonstrated. There is a questionable subtle mild abnormality apically, which was not definitive.  Distal aortography revealed widely patent renal arteries. There was no evidence for dissection. There was no significant aortoiliac disease.  IMPRESSION: 1. Probable acute coronary syndrome, leading to transient coronary vasospasm, relieved with nitroglycerin. 2. Mild, currently nonobstructive coronary artery disease, with 20-30% proximal left anterior descending stenoses, 40% mid left anterior descending narrowing, 40% circumflex narrowing, and scattered 30% proximal to mid right coronary artery narrowing. The left system narrowing were improved following IC nitroglycerin. 3. Normal left ventricular function with suggestion of left ventricular hypertrophy without definitive wall motion abnormality.  Lexiscan Myoview 03/05/2017:  There was no ST segment deviation noted during stress.  Defect 1: There is a medium defect of mild severity present in the mid inferior and apical inferior location, and was consistent with soft tissue attenuation artifact.  The study is normal. No ischemia or scar.  This is a low risk study.  Nuclear stress EF: 56%.  Assessment and  Plan:  1. Cardiac evaluation in the setting of DOT clearance as discussed above. I reviewed his recent exercise Myoview report. 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 He does have evidence of inferior wall ischemia but normal LVEF, no palpitations or syncope. Unless he starts to manifest progressive angina that would require repeat cardiac catheterization, would continue medical therapy and observation at this point. He is on aspirin, Norvasc, Imdur, Cozaar, and Zocor. Nitroglycerin also available as needed.  2. Mixed hyperlipidemia, he continues on Zocor with follow-up by PCP. Would aim for LDL 70 or less if possible. No recent lab work for review.  3. Essential hypertension, currently on Norvasc and  Cozaar. Blood pressure is well controlled today.   Medication Adjustments/Labs and Tests Ordered: Current medicines are reviewed at length with the patient today.  Concerns regarding medicines are outlined above.   Tests Ordered: Orders Placed This Encounter  Procedures  . EKG 12-Lead    Medication Changes: No orders of the defined types were placed in this encounter.   Disposition:  Follow up 6 months in the [place] office.  Signed, Satira Sark, MD, [facility] 02/22/2020 1:52 PM    [facility] [place], [place], [place] [address] Phone: [phone]; Fax: [phone]

## 2020-03-29 ENCOUNTER — Ambulatory Visit: Payer: Self-pay | Admitting: Cardiology

## 2020-03-30 ENCOUNTER — Other Ambulatory Visit: Payer: Self-pay | Admitting: Family Medicine

## 2020-04-02 ENCOUNTER — Other Ambulatory Visit: Payer: Self-pay | Admitting: Nurse Practitioner

## 2020-04-04 NOTE — Telephone Encounter (Signed)
Looks like patient has established with another GI provider. Recommend he obtain medications/refills from them.

## 2020-04-06 NOTE — Telephone Encounter (Signed)
Noted  

## 2020-04-11 ENCOUNTER — Other Ambulatory Visit: Payer: Self-pay | Admitting: Nurse Practitioner

## 2020-08-14 ENCOUNTER — Encounter: Payer: Self-pay | Admitting: Internal Medicine

## 2020-08-29 ENCOUNTER — Ambulatory Visit: Payer: Self-pay | Admitting: Cardiology

## 2020-09-12 ENCOUNTER — Ambulatory Visit: Payer: BC Managed Care – PPO | Admitting: Internal Medicine

## 2020-09-28 ENCOUNTER — Encounter: Payer: Self-pay | Admitting: Cardiology

## 2020-09-28 ENCOUNTER — Ambulatory Visit: Payer: BC Managed Care – PPO | Admitting: Cardiology

## 2020-09-28 ENCOUNTER — Other Ambulatory Visit: Payer: Self-pay | Admitting: Cardiology

## 2020-09-28 ENCOUNTER — Other Ambulatory Visit: Payer: Self-pay

## 2020-09-28 ENCOUNTER — Telehealth: Payer: Self-pay | Admitting: Cardiology

## 2020-09-28 VITALS — BP 138/78 | HR 80 | Ht 75.0 in | Wt 313.0 lb

## 2020-09-28 DIAGNOSIS — I25119 Atherosclerotic heart disease of native coronary artery with unspecified angina pectoris: Secondary | ICD-10-CM

## 2020-09-28 DIAGNOSIS — E782 Mixed hyperlipidemia: Secondary | ICD-10-CM

## 2020-09-28 DIAGNOSIS — I200 Unstable angina: Secondary | ICD-10-CM | POA: Diagnosis not present

## 2020-09-28 DIAGNOSIS — Z01810 Encounter for preprocedural cardiovascular examination: Secondary | ICD-10-CM | POA: Diagnosis not present

## 2020-09-28 DIAGNOSIS — I10 Essential (primary) hypertension: Secondary | ICD-10-CM | POA: Diagnosis not present

## 2020-09-28 MED ORDER — SODIUM CHLORIDE 0.9% FLUSH: 3.0000 mL | Freq: Two times a day (BID) | INTRAVENOUS | Status: DC

## 2020-09-28 NOTE — Patient Instructions (Addendum)
Medication Instructions:   Your physician recommends that you continue on your current medications as directed. Please refer to the Current Medication list given to you today.  Labwork:  Your physician recommends that you return for non-fasting lab work in: Monday, 10/02/2020 at [facility] to check your BMET & CBC.  Please have your covid test at 2:00 pm at [place] [facility]. Please quarantine afterwards until your heart cath is complete.  Testing/Procedures: Your physician has requested that you have a cardiac catheterization. Cardiac catheterization is used to diagnose and/or treat various heart conditions. Doctors may recommend this procedure for a number of different reasons. The most common reason is to evaluate chest pain. Chest pain can be a symptom of coronary artery disease (CAD), and cardiac catheterization can show whether plaque is narrowing or blocking your heart's arteries. This procedure is also used to evaluate the valves, as well as measure the blood flow and oxygen levels in different parts of your heart. For further information please visit [email or web address]. Please follow instruction sheet, as given.  Follow-Up:  Your physician recommends that you schedule a follow-up appointment in: 1 month.  Any Other Special Instructions Will Be Listed Below (If Applicable).  If you need a refill on your cardiac medications before your next appointment, please call your pharmacy.     [place] [address] Dept: [phone] Loc: [place]  09/28/2020  You are scheduled for a Cardiac Catheterization on Wednesday, April 13 with Dr. Shelva Majestic.  1. Please arrive at the [facility] (Main Entrance A) at [facility]: [address] [place], [place] [address] at 8:00 AM (This time is two hours before your procedure  to ensure your preparation). Free valet parking service is available.   Special note: Every effort is made to have your procedure done on time. Please understand that emergencies sometimes delay scheduled procedures.  2. Diet: Do not eat solid foods after midnight.  The patient may have clear liquids until 5am upon the day of the procedure.  3. Labs: You will need to have blood drawn on Monday, April 11 at [facility] in [place]   4. Medication instructions in preparation for your procedure:   Contrast Allergy: No  Do not take Diabetes Med Glucophage (Metformin) and Glucovance (Metformin + Glyburide) on the day of the procedure and HOLD 48 HOURS AFTER THE PROCEDURE.  Hold your glimepiride on the morning of your cath  On the morning of your procedure, take your Aspirin 81 mg and any morning medicines NOT listed above.  You may use sips of water.  5. Plan for one night stay--bring personal belongings. 6. Bring a current list of your medications and current insurance cards. 7. You MUST have a responsible person to drive you home. 8. Someone MUST be with you the first 24 hours after you arrive home or your discharge will be delayed. 9. Please wear clothes that are easy to get on and off and wear slip-on shoes.  Thank you for allowing [place] to care for you!   -- [facility]

## 2020-09-28 NOTE — H&P (View-Only) (Signed)
Cardiology Office Note  Date: 09/28/2020   ID: Jerome Shepherd, DOB 1965-08-10, MRN [id number]  PCP:  Glenda Chroman, MD  Cardiologist:  Rozann Lesches, MD Electrophysiologist:  None   Chief Complaint  Patient presents with  . Cardiac follow-up    History of Present Illness: Jerome Shepherd is a 55 y.o. male last seen in August 2021.  He presents for a follow-up visit.  He states that over the last 4 to 6 weeks he has been experiencing recurring episodes of chest tightness, usually central but sometimes upper chest.  Feels short of breath more so than usual with typical activities, even walking.  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  Cardiac catheterization in 2013 demonstrated only mild atherosclerosis with suspected coronary vasospasm.  His most recent ischemic evaluation was via exercise Myoview in August 2021 as noted below.  He was managed medically in the absence of any active angina at that point.  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  I personally reviewed his ECG today which shows normal sinus rhythm.  Past Medical History:  Diagnosis Date  . 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 Arthritis   . CAD (coronary artery disease)    Nonobstructive by cardiac catheterization 2013  . Constipation   . Coronary vasospasm ([place])   . Essential hypertension   . GERD (gastroesophageal reflux disease)   . Hyperlipidemia   . Type 2 diabetes mellitus ([place])     Past Surgical History:  Procedure Laterality Date  . BIOPSY  02/19/2018   Procedure: BIOPSY;  Surgeon: Daneil Dolin, MD;  [facility];  Service: Endoscopy;;  esophagus  . CARDIAC CATHETERIZATION  2013   non obstructive CAD  ~20%  . CHOLECYSTECTOMY N/A 04/01/2014   Procedure: LAPAROSCOPIC  CHOLECYSTECTOMY;  Surgeon: Jamesetta So, MD;  Location: [facility];  Service: General;  Laterality: N/A;  . COLONOSCOPY WITH PROPOFOL N/A 08/22/2016   Procedure: COLONOSCOPY WITH PROPOFOL;  Surgeon: Daneil Dolin, MD;  [facility];  Service: Endoscopy;  Laterality: N/A;  730   . ESOPHAGOGASTRODUODENOSCOPY N/A 01/10/2014   Dr. Gala Romney: antral erosions, path with chronic inactive gastritis. Empiric dilation with 56-F dilation  . ESOPHAGOGASTRODUODENOSCOPY (EGD) WITH PROPOFOL N/A 02/16/2016   Dr. Gala Romney: small hiatal hernia, normal esopagus s/p empiric dilation  . ESOPHAGOGASTRODUODENOSCOPY (EGD) WITH PROPOFOL N/A 02/19/2018   Procedure: ESOPHAGOGASTRODUODENOSCOPY (EGD) WITH PROPOFOL;  Surgeon: Daneil Dolin, MD;  [facility];  Service: Endoscopy;  Laterality: N/A;  10:15am  . KNEE ARTHROSCOPY Right 2002  . LEFT HEART CATH N/A 08/18/2011   Procedure: LEFT HEART CATH;  Surgeon: Lorretta Harp, MD;  Location: [facility] CATH LAB;  Service: Cardiovascular;  Laterality: N/A;  . LIVER BIOPSY N/A 04/01/2014   mild fibrosis  . MALONEY DILATION N/A 01/10/2014   Procedure: Venia Minks DILATION;  Surgeon: Daneil Dolin, MD;  [facility];  Service: Endoscopy;  Laterality: N/A;  Venia Minks DILATION N/A 02/16/2016   Procedure: Venia Minks DILATION;  Surgeon: Daneil Dolin, MD;  [facility];  Service: Endoscopy;  Laterality: N/A;  . Venia Minks DILATION N/A 02/19/2018   Procedure: Venia Minks DILATION;  Surgeon: Daneil Dolin, MD;  [facility];  Service: Endoscopy;  Laterality: N/A;  . POLYPECTOMY  08/22/2016   Procedure: POLYPECTOMY;  Surgeon: Daneil Dolin, MD;  [facility];  Service: Endoscopy;;  . Azzie Almas DILATION N/A 01/10/2014   Procedure: Azzie Almas DILATION;  Surgeon: Daneil Dolin, MD;  [facility];  Service: Endoscopy;  Laterality: N/A;    Current Outpatient Medications  Medication Sig Dispense Refill  . amLODipine (NORVASC) 10 MG tablet TAKE ONE  TABLET BY MOUTH EVERY DAY PATIENT NEEDS OFFICE VISIT (Patient taking differently: Take 10 mg by mouth daily.) 7 tablet 0  . Ascorbic Acid (VITAMIN C) 1000 MG tablet Take 1,000 mg by mouth daily as needed (for cold like symptoms).     Marland Kitchen aspirin EC 81 MG tablet Take 1 tablet (81 mg total) by mouth daily.    . Dulaglutide (TRULICITY [place]) Inject into the skin once a week.    . esomeprazole (NEXIUM) 40 MG capsule TAKE ONE CAPSULE BY MOUTH TWICE DAILY BEFORE A MEAL 60 capsule 5  . glimepiride (AMARYL) 4 MG tablet Take 4 mg by mouth 2 (two) times daily.   3  . isosorbide mononitrate (IMDUR) 60 MG 24 hr tablet TAKE ONE TABLET BY MOUTH EVERY DAY PATIENT NEEDS OFFICE VISIT (Patient taking differently: Take 60 mg by mouth daily.) 7 tablet 0  . losartan (COZAAR) 50 MG tablet TAKE 1 TABLET DAILY (NEED OFFICE VISIT) 90 tablet 3  . Magnesium 500 MG TABS Take 500 mg by mouth daily as needed (for leg cramps).     . metFORMIN (GLUCOPHAGE) 500 MG tablet Take 500 mg by mouth 2 (two) times daily.     . nitroGLYCERIN (NITROSTAT) 0.4 MG SL tablet Place 1 tablet (0.4 mg total) under the tongue every 5 (five) minutes as needed for chest pain. 25 tablet 3  . simvastatin (ZOCOR) 40 MG tablet TAKE ONE TABLET BY MOUTH EVERY EVENING (Patient taking differently: Take 40 mg by mouth at bedtime.) 7 tablet 0  . VITAMIN D PO Take 1 Dose by mouth daily.     No current facility-administered medications for this visit.   Allergies:  Codeine, Diamox [acetazolamide], and Sulfa antibiotics   Social History: The patient  reports that he quit smoking about 25 years ago. His smoking use included cigarettes. He has a 22.50 pack-year smoking history. He has never used smokeless tobacco. He reports that he does not drink alcohol and does not use drugs.   Family History: The patient's family history includes Diabetes type II in his sister; Heart attack in his cousin.   ROS: No palpitations or syncope.  Physical Exam: VS:  BP 138/78    Pulse 80   Ht 6' 3"  (1.905 m)   Wt (!) 313 lb (142 kg)   SpO2 96%   BMI 39.12 kg/m , BMI Body mass index is 39.12 kg/m.  Wt Readings from Last 3 Encounters:  09/28/20 (!) 313 lb (142 kg)  02/22/20 (!) 316 lb 12.8 oz (143.7 kg)  02/18/19 (!) 315 lb (142.9 kg)    General: Obese male, appears comfortable at rest. HEENT: Conjunctiva and lids normal, wearing a mask. Neck: Supple, no elevated JVP or carotid bruits, no thyromegaly. Lungs: Clear to auscultation, nonlabored breathing at rest. Cardiac: Regular rate and rhythm, no S3 or significant systolic murmur, no pericardial rub. Abdomen: Soft, nontender, bowel sounds present, no guarding or rebound. Extremities: No pitting edema, distal pulses 2+. Skin: Warm and dry. Musculoskeletal: No kyphosis. Neuropsychiatric: Alert and oriented x3, affect grossly appropriate.  ECG:  An ECG dated 02/22/2020 was personally reviewed today and demonstrated:  Sinus rhythm with poor R wave progression.  Recent Labwork:  No interval lab work for review today.  Other Studies Reviewed Today:  Cardiac catheterization 08/18/2011: HEMODYNAMIC DATA: Central aortic pressure was 129/78. Left ventricular pressure 129/9, post A-wave 16.  ANGIOGRAPHIC DATA: Left main coronary artery was a long normal vessel, which had some very smooth, mild distal taper prior to trifurcating into an LAD, intermediate, and circumflex system.  The LAD had luminal irregularities of 20-30% proximally in the region of the 1st septal and diagonal vessel. In the mid LAD, there did appear to be an area of mild narrowing of 40%, which appeared in an area that possibly dipped intramyocardially. There was TIMI-3 flow. The LAD extended to and wrapped around the LV apex.  The intermediate like vessel was angiographically normal.  The circumflex vessel had mild narrowing in its mid segment prior to bifurcating into a small distal branch and a distal obtuse  marginal vessel.  The right coronary artery was a large caliber dominant vessel that had luminal irregularities of 30% in the proximal to mid segment with 30% narrowing in the mid segment. The RCA supplied the PDA and posterolateral branch.  Following IC nitroglycerin to the LAD, the mid LAD area seemed to improve as did the circumflex area to less than 20%.  Biplane cine left ventriculography suggested left ventricular hypertrophy. Ejection fraction was approximately 60%. No definitive wall motion abnormality was demonstrated. There is a questionable subtle mild abnormality apically, which was not definitive.  Distal aortography revealed widely patent renal arteries. There was no evidence for dissection. There was no significant aortoiliac disease.  IMPRESSION: 1. Probable acute coronary syndrome, leading to transient coronary vasospasm, relieved with nitroglycerin. 2. Mild, currently nonobstructive coronary artery disease, with 20-30% proximal left anterior descending stenoses, 40% mid left anterior descending narrowing, 40% circumflex narrowing, and scattered 30% proximal to mid right coronary artery narrowing. The left system narrowing were improved following IC nitroglycerin. 3. Normal left ventricular function with suggestion of left ventricular hypertrophy without definitive wall motion abnormality.  Exercise Myoview 02/18/2020 [facility]): 1. Moderate reversibility is seen involving the inferior myocardium  toward the base of the heart consistent with ischemia.   2. Normal left ventricular wall motion.   3. Left ventricular ejection fraction 70%   4. Non invasive risk stratification*: Intermediate   Assessment and Plan:  1.  Accelerating angina in a 55 year old male with previously documented history of mild coronary atherosclerosis and coronary vasospasm as of 2013, more recently abnormal exercise Myoview indicating inferior  distribution ischemia that was managed medically, hyperlipidemia, obesity, and hypertension.  Symptoms have been worsening over the last 4 to 6 weeks.  Current regimen includes aspirin, Norvasc, Imdur, losartan, Zocor, and as needed nitroglycerin.  We have discussed the risks and benefits of a diagnostic cardiac catheterization and he is in agreement to proceed.  This will be scheduled for next week.  2.  Essential hypertension, on Norvasc and losartan.  3.  Mixed hyperlipidemia, on Zocor.  Lab work has been followed by Dr. Woody Seller, I do not have recent results.  Medication Adjustments/Labs and Tests Ordered: Current medicines are reviewed at length with the patient today.  Concerns regarding medicines are outlined above.   Tests Ordered: Orders Placed This Encounter  Procedures  . Basic metabolic panel  . CBC  . EKG 12-Lead    Medication Changes: No orders of the defined types were placed  in this encounter.   Disposition:  Follow up after procedure.  Signed, Satira Sark, MD, [facility] 09/28/2020 4:50 PM    [facility] [address], [place], [place] [address] Phone: [phone]; Fax: [phone]

## 2020-09-28 NOTE — Telephone Encounter (Signed)
Pre-cert Verification for the following procedure    Left heart cath 10/04/2020 @10 :00 am with Dr. Claiborne Billings dx accelerating angina

## 2020-09-28 NOTE — Progress Notes (Signed)
Cardiology Office Note  Date: 09/28/2020   ID: Jerome Shepherd, DOB 01-Jul-1965, MRN [id number]  PCP:  Glenda Chroman, MD  Cardiologist:  Rozann Lesches, MD Electrophysiologist:  None   Chief Complaint  Patient presents with  . Cardiac follow-up    History of Present Illness: Jerome Shepherd is a 55 y.o. male last seen in August 2021.  He presents for a follow-up visit.  He states that over the last 4 to 6 weeks he has been experiencing recurring episodes of chest tightness, usually central but sometimes upper chest.  Feels short of breath more so than usual with typical activities, even walking.  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  Cardiac catheterization in 2013 demonstrated only mild atherosclerosis with suspected coronary vasospasm.  His most recent ischemic evaluation was via exercise Myoview in August 2021 as noted below.  He was managed medically in the absence of any active angina at that point.  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  I personally reviewed his ECG today which shows normal sinus rhythm.  Past Medical History:  Diagnosis Date  . 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 Arthritis   . CAD (coronary artery disease)    Nonobstructive by cardiac catheterization 2013  . Constipation   . Coronary vasospasm ([place])   . Essential hypertension   . GERD (gastroesophageal reflux disease)   . Hyperlipidemia   . Type 2 diabetes mellitus ([place])     Past Surgical History:  Procedure Laterality Date  . BIOPSY  02/19/2018   Procedure: BIOPSY;  Surgeon: Daneil Dolin, MD;  [facility];  Service: Endoscopy;;  esophagus  . CARDIAC CATHETERIZATION  2013   non obstructive CAD  ~20%  . CHOLECYSTECTOMY N/A 04/01/2014   Procedure: LAPAROSCOPIC  CHOLECYSTECTOMY;  Surgeon: Jamesetta So, MD;  Location: [facility];  Service: General;  Laterality: N/A;  . COLONOSCOPY WITH PROPOFOL N/A 08/22/2016   Procedure: COLONOSCOPY WITH PROPOFOL;  Surgeon: Daneil Dolin, MD;  [facility];  Service: Endoscopy;  Laterality: N/A;  730   . ESOPHAGOGASTRODUODENOSCOPY N/A 01/10/2014   Dr. Gala Romney: antral erosions, path with chronic inactive gastritis. Empiric dilation with 56-F dilation  . ESOPHAGOGASTRODUODENOSCOPY (EGD) WITH PROPOFOL N/A 02/16/2016   Dr. Gala Romney: small hiatal hernia, normal esopagus s/p empiric dilation  . ESOPHAGOGASTRODUODENOSCOPY (EGD) WITH PROPOFOL N/A 02/19/2018   Procedure: ESOPHAGOGASTRODUODENOSCOPY (EGD) WITH PROPOFOL;  Surgeon: Daneil Dolin, MD;  [facility];  Service: Endoscopy;  Laterality: N/A;  10:15am  . KNEE ARTHROSCOPY Right 2002  . LEFT HEART CATH N/A 08/18/2011   Procedure: LEFT HEART CATH;  Surgeon: Lorretta Harp, MD;  Location: [facility] CATH LAB;  Service: Cardiovascular;  Laterality: N/A;  . LIVER BIOPSY N/A 04/01/2014   mild fibrosis  . MALONEY DILATION N/A 01/10/2014   Procedure: Venia Minks DILATION;  Surgeon: Daneil Dolin, MD;  [facility];  Service: Endoscopy;  Laterality: N/A;  Venia Minks DILATION N/A 02/16/2016   Procedure: Venia Minks DILATION;  Surgeon: Daneil Dolin, MD;  [facility];  Service: Endoscopy;  Laterality: N/A;  . Venia Minks DILATION N/A 02/19/2018   Procedure: Venia Minks DILATION;  Surgeon: Daneil Dolin, MD;  [facility];  Service: Endoscopy;  Laterality: N/A;  . POLYPECTOMY  08/22/2016   Procedure: POLYPECTOMY;  Surgeon: Daneil Dolin, MD;  [facility];  Service: Endoscopy;;  . Azzie Almas DILATION N/A 01/10/2014   Procedure: Azzie Almas DILATION;  Surgeon: Daneil Dolin, MD;  [facility];  Service: Endoscopy;  Laterality: N/A;    Current Outpatient Medications  Medication Sig Dispense Refill  . amLODipine (NORVASC) 10 MG tablet TAKE ONE  TABLET BY MOUTH EVERY DAY PATIENT NEEDS OFFICE VISIT (Patient taking differently: Take 10 mg by mouth daily.) 7 tablet 0  . Ascorbic Acid (VITAMIN C) 1000 MG tablet Take 1,000 mg by mouth daily as needed (for cold like symptoms).     Marland Kitchen aspirin EC 81 MG tablet Take 1 tablet (81 mg total) by mouth daily.    . Dulaglutide (TRULICITY [place]) Inject into the skin once a week.    . esomeprazole (NEXIUM) 40 MG capsule TAKE ONE CAPSULE BY MOUTH TWICE DAILY BEFORE A MEAL 60 capsule 5  . glimepiride (AMARYL) 4 MG tablet Take 4 mg by mouth 2 (two) times daily.   3  . isosorbide mononitrate (IMDUR) 60 MG 24 hr tablet TAKE ONE TABLET BY MOUTH EVERY DAY PATIENT NEEDS OFFICE VISIT (Patient taking differently: Take 60 mg by mouth daily.) 7 tablet 0  . losartan (COZAAR) 50 MG tablet TAKE 1 TABLET DAILY (NEED OFFICE VISIT) 90 tablet 3  . Magnesium 500 MG TABS Take 500 mg by mouth daily as needed (for leg cramps).     . metFORMIN (GLUCOPHAGE) 500 MG tablet Take 500 mg by mouth 2 (two) times daily.     . nitroGLYCERIN (NITROSTAT) 0.4 MG SL tablet Place 1 tablet (0.4 mg total) under the tongue every 5 (five) minutes as needed for chest pain. 25 tablet 3  . simvastatin (ZOCOR) 40 MG tablet TAKE ONE TABLET BY MOUTH EVERY EVENING (Patient taking differently: Take 40 mg by mouth at bedtime.) 7 tablet 0  . VITAMIN D PO Take 1 Dose by mouth daily.     No current facility-administered medications for this visit.   Allergies:  Codeine, Diamox [acetazolamide], and Sulfa antibiotics   Social History: The patient  reports that he quit smoking about 25 years ago. His smoking use included cigarettes. He has a 22.50 pack-year smoking history. He has never used smokeless tobacco. He reports that he does not drink alcohol and does not use drugs.   Family History: The patient's family history includes Diabetes type II in his sister; Heart attack in his cousin.   ROS: No palpitations or syncope.  Physical Exam: VS:  BP 138/78    Pulse 80   Ht 6' 3"  (1.905 m)   Wt (!) 313 lb (142 kg)   SpO2 96%   BMI 39.12 kg/m , BMI Body mass index is 39.12 kg/m.  Wt Readings from Last 3 Encounters:  09/28/20 (!) 313 lb (142 kg)  02/22/20 (!) 316 lb 12.8 oz (143.7 kg)  02/18/19 (!) 315 lb (142.9 kg)    General: Obese male, appears comfortable at rest. HEENT: Conjunctiva and lids normal, wearing a mask. Neck: Supple, no elevated JVP or carotid bruits, no thyromegaly. Lungs: Clear to auscultation, nonlabored breathing at rest. Cardiac: Regular rate and rhythm, no S3 or significant systolic murmur, no pericardial rub. Abdomen: Soft, nontender, bowel sounds present, no guarding or rebound. Extremities: No pitting edema, distal pulses 2+. Skin: Warm and dry. Musculoskeletal: No kyphosis. Neuropsychiatric: Alert and oriented x3, affect grossly appropriate.  ECG:  An ECG dated 02/22/2020 was personally reviewed today and demonstrated:  Sinus rhythm with poor R wave progression.  Recent Labwork:  No interval lab work for review today.  Other Studies Reviewed Today:  Cardiac catheterization 08/18/2011: HEMODYNAMIC DATA: Central aortic pressure was 129/78. Left ventricular pressure 129/9, post A-wave 16.  ANGIOGRAPHIC DATA: Left main coronary artery was a long normal vessel, which had some very smooth, mild distal taper prior to trifurcating into an LAD, intermediate, and circumflex system.  The LAD had luminal irregularities of 20-30% proximally in the region of the 1st septal and diagonal vessel. In the mid LAD, there did appear to be an area of mild narrowing of 40%, which appeared in an area that possibly dipped intramyocardially. There was TIMI-3 flow. The LAD extended to and wrapped around the LV apex.  The intermediate like vessel was angiographically normal.  The circumflex vessel had mild narrowing in its mid segment prior to bifurcating into a small distal branch and a distal obtuse  marginal vessel.  The right coronary artery was a large caliber dominant vessel that had luminal irregularities of 30% in the proximal to mid segment with 30% narrowing in the mid segment. The RCA supplied the PDA and posterolateral branch.  Following IC nitroglycerin to the LAD, the mid LAD area seemed to improve as did the circumflex area to less than 20%.  Biplane cine left ventriculography suggested left ventricular hypertrophy. Ejection fraction was approximately 60%. No definitive wall motion abnormality was demonstrated. There is a questionable subtle mild abnormality apically, which was not definitive.  Distal aortography revealed widely patent renal arteries. There was no evidence for dissection. There was no significant aortoiliac disease.  IMPRESSION: 1. Probable acute coronary syndrome, leading to transient coronary vasospasm, relieved with nitroglycerin. 2. Mild, currently nonobstructive coronary artery disease, with 20-30% proximal left anterior descending stenoses, 40% mid left anterior descending narrowing, 40% circumflex narrowing, and scattered 30% proximal to mid right coronary artery narrowing. The left system narrowing were improved following IC nitroglycerin. 3. Normal left ventricular function with suggestion of left ventricular hypertrophy without definitive wall motion abnormality.  Exercise Myoview 02/18/2020 [facility]): 1. Moderate reversibility is seen involving the inferior myocardium  toward the base of the heart consistent with ischemia.   2. Normal left ventricular wall motion.   3. Left ventricular ejection fraction 70%   4. Non invasive risk stratification*: Intermediate   Assessment and Plan:  1.  Accelerating angina in a 55 year old male with previously documented history of mild coronary atherosclerosis and coronary vasospasm as of 2013, more recently abnormal exercise Myoview indicating inferior  distribution ischemia that was managed medically, hyperlipidemia, obesity, and hypertension.  Symptoms have been worsening over the last 4 to 6 weeks.  Current regimen includes aspirin, Norvasc, Imdur, losartan, Zocor, and as needed nitroglycerin.  We have discussed the risks and benefits of a diagnostic cardiac catheterization and he is in agreement to proceed.  This will be scheduled for next week.  2.  Essential hypertension, on Norvasc and losartan.  3.  Mixed hyperlipidemia, on Zocor.  Lab work has been followed by Dr. Woody Seller, I do not have recent results.  Medication Adjustments/Labs and Tests Ordered: Current medicines are reviewed at length with the patient today.  Concerns regarding medicines are outlined above.   Tests Ordered: Orders Placed This Encounter  Procedures  . Basic metabolic panel  . CBC  . EKG 12-Lead    Medication Changes: No orders of the defined types were placed  in this encounter.   Disposition:  Follow up after procedure.  Signed, Satira Sark, MD, [facility] 09/28/2020 4:50 PM    [facility] [address], [place], [place] [address] Phone: [phone]; Fax: [phone]

## 2020-10-02 ENCOUNTER — Other Ambulatory Visit (HOSPITAL_COMMUNITY)
Admission: RE | Admit: 2020-10-02 | Discharge: 2020-10-02 | Disposition: A | Discharge status: Home / Self Care | Payer: BC Managed Care – PPO | Source: Ambulatory Visit | Attending: Cardiology | Admitting: Cardiology

## 2020-10-02 ENCOUNTER — Other Ambulatory Visit (HOSPITAL_COMMUNITY)
Admission: RE | Admit: 2020-10-02 | Discharge: 2020-10-02 | Disposition: A | Discharge status: Home / Self Care | Payer: BC Managed Care – PPO | Source: Ambulatory Visit | Attending: Cardiovascular Disease | Admitting: Cardiovascular Disease

## 2020-10-02 ENCOUNTER — Other Ambulatory Visit: Payer: Self-pay

## 2020-10-02 DIAGNOSIS — Z01810 Encounter for preprocedural cardiovascular examination: Secondary | ICD-10-CM | POA: Insufficient documentation

## 2020-10-02 DIAGNOSIS — I200 Unstable angina: Secondary | ICD-10-CM

## 2020-10-02 DIAGNOSIS — Z01812 Encounter for preprocedural laboratory examination: Secondary | ICD-10-CM | POA: Insufficient documentation

## 2020-10-02 DIAGNOSIS — Z20822 Contact with and (suspected) exposure to covid-19: Secondary | ICD-10-CM | POA: Insufficient documentation

## 2020-10-02 LAB — BASIC METABOLIC PANEL
Anion gap: 13 (ref 5–15)
BUN: 14 mg/dL (ref 6–20)
CO2: 21 mmol/L — ABNORMAL LOW (ref 22–32)
Calcium: 9.2 mg/dL (ref 8.9–10.3)
Chloride: 105 mmol/L (ref 98–111)
Creatinine, Ser: 1.07 mg/dL (ref 0.61–1.24)
GFR, Estimated: >60 mL/min (ref >60)
Glucose, Bld: 138 mg/dL — ABNORMAL HIGH (ref 70–99)
Potassium: 3.9 mmol/L (ref 3.5–5.1)
Sodium: 139 mmol/L (ref 135–145)

## 2020-10-02 LAB — CBC
HCT: 44.8 % (ref 39.0–52.0)
Hemoglobin: 15.7 g/dL (ref 13.0–17.0)
MCH: 32.4 pg (ref 26.0–34.0)
MCHC: 35 g/dL (ref 30.0–36.0)
MCV: 92.4 fL (ref 80.0–100.0)
Platelets: 284 10*3/uL (ref 150–400)
RBC: 4.85 MIL/uL (ref 4.22–5.81)
RDW: 12 % (ref 11.5–15.5)
WBC: 8.4 10*3/uL (ref 4.0–10.5)
nRBC: 0 % (ref 0.0–0.2)

## 2020-10-02 LAB — SARS CORONAVIRUS 2 (TAT 6-24 HRS): SARS Coronavirus 2: NEGATIVE

## 2020-10-02 NOTE — Progress Notes (Signed)
Patient came in, I swabbed him, created another Covid order. The first one was canceled. I released the order and printed it.

## 2020-10-03 ENCOUNTER — Telehealth: Payer: Self-pay | Admitting: *Deleted

## 2020-10-03 NOTE — Telephone Encounter (Signed)
Pt contacted pre-catheterization scheduled at [facility] for: Wednesday October 04, 2020 10 AM Verified arrival time and place: [facility]) at: 8 AM   No solid food after midnight prior to cath, clear liquids until 5 AM day of procedure.  Hold: Metformin-day of procedure and 48 hours post procedure  Glimepiride-AM of procedure  Except hold medications AM meds can be  taken pre-cath with sips of water including: ASA 81 mg   Confirmed patient has responsible adult to drive home post procedure and be with patient first 24 hours after arriving home: yes  You are allowed ONE visitor in the waiting room during the time you are at the hospital for your procedure. Both you and your visitor must wear a mask once you enter the hospital.   Reviewed procedure/mask/visitor instructions with patient.

## 2020-10-04 ENCOUNTER — Other Ambulatory Visit: Payer: Self-pay

## 2020-10-04 ENCOUNTER — Encounter (HOSPITAL_COMMUNITY): Payer: Self-pay | Admitting: Cardiovascular Disease

## 2020-10-04 ENCOUNTER — Encounter (HOSPITAL_COMMUNITY)
Admission: RE | Disposition: A | Discharge status: Home / Self Care | Payer: Self-pay | Source: Ambulatory Visit | Attending: Cardiovascular Disease

## 2020-10-04 ENCOUNTER — Inpatient Hospital Stay (HOSPITAL_COMMUNITY): Payer: BC Managed Care – PPO

## 2020-10-04 ENCOUNTER — Inpatient Hospital Stay (HOSPITAL_COMMUNITY)
Admission: RE | Admit: 2020-10-04 | Discharge: 2020-10-06 | DRG: 247 | Disposition: A | Discharge status: Home / Self Care | Payer: BC Managed Care – PPO | Attending: Cardiovascular Disease | Admitting: Cardiovascular Disease

## 2020-10-04 DIAGNOSIS — I110 Hypertensive heart disease with heart failure: Secondary | ICD-10-CM | POA: Diagnosis present

## 2020-10-04 DIAGNOSIS — Z8249 Family history of ischemic heart disease and other diseases of the circulatory system: Secondary | ICD-10-CM | POA: Diagnosis not present

## 2020-10-04 DIAGNOSIS — Z79899 Other long term (current) drug therapy: Secondary | ICD-10-CM | POA: Diagnosis not present

## 2020-10-04 DIAGNOSIS — F419 Anxiety disorder, unspecified: Secondary | ICD-10-CM | POA: Diagnosis not present

## 2020-10-04 DIAGNOSIS — Z955 Presence of coronary angioplasty implant and graft: Secondary | ICD-10-CM

## 2020-10-04 DIAGNOSIS — E782 Mixed hyperlipidemia: Secondary | ICD-10-CM | POA: Diagnosis present

## 2020-10-04 DIAGNOSIS — I25110 Atherosclerotic heart disease of native coronary artery with unstable angina pectoris: Principal | ICD-10-CM

## 2020-10-04 DIAGNOSIS — I2510 Atherosclerotic heart disease of native coronary artery without angina pectoris: Secondary | ICD-10-CM | POA: Diagnosis not present

## 2020-10-04 DIAGNOSIS — I200 Unstable angina: Secondary | ICD-10-CM | POA: Diagnosis present

## 2020-10-04 DIAGNOSIS — Z833 Family history of diabetes mellitus: Secondary | ICD-10-CM | POA: Diagnosis not present

## 2020-10-04 DIAGNOSIS — I2582 Chronic total occlusion of coronary artery: Secondary | ICD-10-CM | POA: Diagnosis present

## 2020-10-04 DIAGNOSIS — Z87891 Personal history of nicotine dependence: Secondary | ICD-10-CM | POA: Diagnosis not present

## 2020-10-04 DIAGNOSIS — M1990 Unspecified osteoarthritis, unspecified site: Secondary | ICD-10-CM | POA: Diagnosis present

## 2020-10-04 DIAGNOSIS — Z20822 Contact with and (suspected) exposure to covid-19: Secondary | ICD-10-CM | POA: Diagnosis present

## 2020-10-04 DIAGNOSIS — I5032 Chronic diastolic (congestive) heart failure: Secondary | ICD-10-CM | POA: Diagnosis present

## 2020-10-04 DIAGNOSIS — Z7984 Long term (current) use of oral hypoglycemic drugs: Secondary | ICD-10-CM | POA: Diagnosis not present

## 2020-10-04 DIAGNOSIS — Z885 Allergy status to narcotic agent status: Secondary | ICD-10-CM | POA: Diagnosis not present

## 2020-10-04 DIAGNOSIS — K219 Gastro-esophageal reflux disease without esophagitis: Secondary | ICD-10-CM | POA: Diagnosis present

## 2020-10-04 DIAGNOSIS — Z7982 Long term (current) use of aspirin: Secondary | ICD-10-CM | POA: Diagnosis not present

## 2020-10-04 DIAGNOSIS — Z882 Allergy status to sulfonamides status: Secondary | ICD-10-CM | POA: Diagnosis not present

## 2020-10-04 DIAGNOSIS — E119 Type 2 diabetes mellitus without complications: Secondary | ICD-10-CM | POA: Diagnosis present

## 2020-10-04 DIAGNOSIS — Z888 Allergy status to other drugs, medicaments and biological substances status: Secondary | ICD-10-CM

## 2020-10-04 HISTORY — PX: LEFT HEART CATH AND CORONARY ANGIOGRAPHY: CATH118249

## 2020-10-04 LAB — ECHOCARDIOGRAM COMPLETE
Area-P 1/2: 3.77 cm2
Height: 75 in
S' Lateral: 3.5 cm
Single Plane A4C EF: 54.4 %
Weight: 4992 oz

## 2020-10-04 LAB — GLUCOSE, CAPILLARY
Glucose-Capillary: 195 mg/dL — ABNORMAL HIGH (ref 70–99)
Glucose-Capillary: 126 mg/dL — ABNORMAL HIGH (ref 70–99)
Glucose-Capillary: 266 mg/dL — ABNORMAL HIGH (ref 70–99)
Glucose-Capillary: 178 mg/dL — ABNORMAL HIGH (ref 70–99)

## 2020-10-04 SURGERY — LEFT HEART CATH AND CORONARY ANGIOGRAPHY
Anesthesia: LOCAL

## 2020-10-04 MED ORDER — SODIUM CHLORIDE 0.9% FLUSH
3.0000 mL | INTRAVENOUS | Status: DC | PRN
Start: 2020-10-04 — End: 2020-10-04

## 2020-10-04 MED ORDER — SODIUM CHLORIDE 0.9 % IV SOLN: INTRAVENOUS | Status: DC

## 2020-10-04 MED ORDER — HEPARIN SODIUM (PORCINE) 1000 UNIT/ML IJ SOLN
INTRAMUSCULAR | Status: AC
  Filled 2020-10-04: qty 1

## 2020-10-04 MED ORDER — SODIUM CHLORIDE 0.9 % WEIGHT BASED INFUSION: 1.0000 mL/kg/h | INTRAVENOUS | Status: DC

## 2020-10-04 MED ORDER — ASPIRIN 81 MG PO CHEW
81.0000 mg | CHEWABLE_TABLET | Freq: Every day | ORAL | Status: DC
  Administered 2020-10-06: 81 mg via ORAL
  Filled 2020-10-04 (×3): qty 1

## 2020-10-04 MED ORDER — HEPARIN (PORCINE) IN NACL 1000-0.9 UT/500ML-% IV SOLN
INTRAVENOUS | Status: AC
  Filled 2020-10-04: qty 500

## 2020-10-04 MED ORDER — LIDOCAINE HCL (PF) 1 % IJ SOLN
INTRAMUSCULAR | Status: DC | PRN
  Administered 2020-10-04: 2 mL

## 2020-10-04 MED ORDER — ONDANSETRON HCL 4 MG/2ML IJ SOLN: 4.0000 mg | Freq: Four times a day (QID) | INTRAMUSCULAR | Status: DC | PRN

## 2020-10-04 MED ORDER — SODIUM CHLORIDE 0.9 % IV SOLN: 250.0000 mL | INTRAVENOUS | Status: DC | PRN

## 2020-10-04 MED ORDER — SODIUM CHLORIDE 0.9 % WEIGHT BASED INFUSION
3.0000 mL/kg/h | INTRAVENOUS | Status: DC
  Administered 2020-10-04: 3 mL/kg/h via INTRAVENOUS

## 2020-10-04 MED ORDER — ASPIRIN 81 MG PO CHEW: 81.0000 mg | CHEWABLE_TABLET | ORAL | Status: DC

## 2020-10-04 MED ORDER — SODIUM CHLORIDE 0.9% FLUSH
3.0000 mL | Freq: Two times a day (BID) | INTRAVENOUS | Status: DC
  Administered 2020-10-04 – 2020-10-05 (×2): 3 mL via INTRAVENOUS

## 2020-10-04 MED ORDER — HEPARIN (PORCINE) IN NACL 1000-0.9 UT/500ML-% IV SOLN
INTRAVENOUS | Status: DC | PRN
  Administered 2020-10-04 (×2): 500 mL

## 2020-10-04 MED ORDER — ACETAMINOPHEN 325 MG PO TABS: 650.0000 mg | ORAL_TABLET | ORAL | Status: DC | PRN

## 2020-10-04 MED ORDER — SODIUM CHLORIDE 0.9% FLUSH: 3.0000 mL | INTRAVENOUS | Status: DC | PRN

## 2020-10-04 MED ORDER — FENTANYL CITRATE (PF) 100 MCG/2ML IJ SOLN
INTRAMUSCULAR | Status: DC | PRN
  Administered 2020-10-04: 50 ug via INTRAVENOUS

## 2020-10-04 MED ORDER — LABETALOL HCL 5 MG/ML IV SOLN: 10.0000 mg | INTRAVENOUS | Status: AC | PRN

## 2020-10-04 MED ORDER — LIDOCAINE HCL (PF) 1 % IJ SOLN
INTRAMUSCULAR | Status: AC
  Filled 2020-10-04: qty 30

## 2020-10-04 MED ORDER — MIDAZOLAM HCL 2 MG/2ML IJ SOLN
INTRAMUSCULAR | Status: AC
  Filled 2020-10-04: qty 2

## 2020-10-04 MED ORDER — DIAZEPAM 5 MG PO TABS: 5.0000 mg | ORAL_TABLET | Freq: Four times a day (QID) | ORAL | Status: DC | PRN

## 2020-10-04 MED ORDER — HEPARIN SODIUM (PORCINE) 1000 UNIT/ML IJ SOLN
INTRAMUSCULAR | Status: DC | PRN
  Administered 2020-10-04: 7000 [IU] via INTRAVENOUS

## 2020-10-04 MED ORDER — MIDAZOLAM HCL 2 MG/2ML IJ SOLN
INTRAMUSCULAR | Status: DC | PRN
  Administered 2020-10-04: 2 mg via INTRAVENOUS

## 2020-10-04 MED ORDER — HYDRALAZINE HCL 20 MG/ML IJ SOLN: 10.0000 mg | INTRAMUSCULAR | Status: AC | PRN

## 2020-10-04 MED ORDER — VERAPAMIL HCL 2.5 MG/ML IV SOLN
INTRAVENOUS | Status: DC | PRN
  Administered 2020-10-04: 10 mL via INTRA_ARTERIAL

## 2020-10-04 MED ORDER — IOHEXOL 350 MG/ML SOLN
INTRAVENOUS | Status: DC | PRN
  Administered 2020-10-04: 190 mL via INTRA_ARTERIAL

## 2020-10-04 MED ORDER — VERAPAMIL HCL 2.5 MG/ML IV SOLN
INTRAVENOUS | Status: AC
  Filled 2020-10-04: qty 2

## 2020-10-04 MED ORDER — FENTANYL CITRATE (PF) 100 MCG/2ML IJ SOLN
INTRAMUSCULAR | Status: AC
  Filled 2020-10-04: qty 2

## 2020-10-04 SURGICAL SUPPLY — 15 items
CATH 5FR JL3.5 JR4 ANG PIG MP (CATHETERS) ×1 IMPLANT
CATH INFINITI 5 FR 3DRC (CATHETERS) ×1 IMPLANT
CATH INFINITI 5 FR AR1 MOD (CATHETERS) ×1 IMPLANT
CATH INFINITI 5 FR RCB (CATHETERS) ×1 IMPLANT
CATH LAUNCHER 5F EBU3.5 (CATHETERS) ×1 IMPLANT
CATH OPTITORQUE TIG 4.0 5F (CATHETERS) ×2 IMPLANT
DEVICE RAD COMP TR BAND LRG (VASCULAR PRODUCTS) ×1 IMPLANT
GLIDESHEATH SLEND SS 6F .021 (SHEATH) ×1 IMPLANT
GUIDEWIRE INQWIRE 1.5J.035X260 (WIRE) IMPLANT
INQWIRE 1.5J .035X260CM (WIRE) ×2
KIT HEART LEFT (KITS) ×2 IMPLANT
PACK CARDIAC CATHETERIZATION (CUSTOM PROCEDURE TRAY) ×2 IMPLANT
SHEATH PROBE COVER 6X72 (BAG) ×2 IMPLANT
TRANSDUCER W/STOPCOCK (MISCELLANEOUS) ×2 IMPLANT
TUBING CIL FLEX 10 FLL-RA (TUBING) ×2 IMPLANT

## 2020-10-04 NOTE — Progress Notes (Signed)
  Echocardiogram 2D Echocardiogram has been performed.  Jerome Shepherd 10/04/2020, 5:03 PM

## 2020-10-04 NOTE — Plan of Care (Signed)
  Problem: Cardiovascular: Goal: Vascular access site(s) Level 0-1 will be maintained Outcome: Progressing   Problem: Education: Goal: Knowledge of General Education information will improve Description: Including pain rating scale, medication(s)/side effects and non-pharmacologic comfort measures Outcome: Progressing   Problem: Clinical Measurements: Goal: Ability to maintain clinical measurements within normal limits will improve Outcome: Progressing

## 2020-10-04 NOTE — Interval H&P Note (Signed)
Cath Lab Visit (complete for each Cath Lab visit)  Clinical Evaluation Leading to the Procedure:   ACS: No.  Non-ACS:    Anginal Classification: CCS III  Anti-ischemic medical therapy: Maximal Therapy (2 or more classes of medications)  Non-Invasive Test Results: Intermediate-risk stress test findings: cardiac mortality 1-3%/year  Prior CABG: No previous CABG      History and Physical Interval Note:  10/04/2020 10:20 AM  Jerome Shepherd  has presented today for surgery, with the diagnosis of accelerating angina.  The various methods of treatment have been discussed with the patient and family. After consideration of risks, benefits and other options for treatment, the patient has consented to  Procedure(s): LEFT HEART CATH AND CORONARY ANGIOGRAPHY (N/A) as a surgical intervention.  The patient's history has been reviewed, patient examined, no change in status, stable for surgery.  I have reviewed the patient's chart and labs.  Questions were answered to the patient's satisfaction.     Shelva Majestic

## 2020-10-05 ENCOUNTER — Encounter (HOSPITAL_COMMUNITY): Payer: Self-pay | Admitting: Cardiovascular Disease

## 2020-10-05 ENCOUNTER — Inpatient Hospital Stay (HOSPITAL_COMMUNITY)
Admission: RE | Disposition: A | Discharge status: Home / Self Care | Payer: Self-pay | Source: Ambulatory Visit | Attending: Cardiovascular Disease

## 2020-10-05 DIAGNOSIS — I25110 Atherosclerotic heart disease of native coronary artery with unstable angina pectoris: Secondary | ICD-10-CM | POA: Diagnosis not present

## 2020-10-05 DIAGNOSIS — I200 Unstable angina: Secondary | ICD-10-CM

## 2020-10-05 HISTORY — PX: CORONARY ATHERECTOMY: CATH118238

## 2020-10-05 HISTORY — PX: TEMPORARY PACEMAKER: CATH118268

## 2020-10-05 LAB — BASIC METABOLIC PANEL
Anion gap: 7 (ref 5–15)
BUN: 10 mg/dL (ref 6–20)
CO2: 24 mmol/L (ref 22–32)
Calcium: 8.5 mg/dL — ABNORMAL LOW (ref 8.9–10.3)
Chloride: 107 mmol/L (ref 98–111)
Creatinine, Ser: 1 mg/dL (ref 0.61–1.24)
GFR, Estimated: >60 mL/min (ref >60)
Glucose, Bld: 167 mg/dL — ABNORMAL HIGH (ref 70–99)
Potassium: 3.7 mmol/L (ref 3.5–5.1)
Sodium: 138 mmol/L (ref 135–145)

## 2020-10-05 LAB — CBC
HCT: 39.2 % (ref 39.0–52.0)
Hemoglobin: 14 g/dL (ref 13.0–17.0)
MCH: 32.3 pg (ref 26.0–34.0)
MCHC: 35.7 g/dL (ref 30.0–36.0)
MCV: 90.3 fL (ref 80.0–100.0)
Platelets: 205 10*3/uL (ref 150–400)
RBC: 4.34 MIL/uL (ref 4.22–5.81)
RDW: 11.9 % (ref 11.5–15.5)
WBC: 6.6 10*3/uL (ref 4.0–10.5)
nRBC: 0 % (ref 0.0–0.2)

## 2020-10-05 LAB — POCT ACTIVATED CLOTTING TIME
Activated Clotting Time: 285 seconds
Activated Clotting Time: 303 seconds
Activated Clotting Time: 660 seconds
Activated Clotting Time: 190 seconds
Activated Clotting Time: 142 seconds

## 2020-10-05 LAB — GLUCOSE, CAPILLARY
Glucose-Capillary: 183 mg/dL — ABNORMAL HIGH (ref 70–99)
Glucose-Capillary: 177 mg/dL — ABNORMAL HIGH (ref 70–99)
Glucose-Capillary: 214 mg/dL — ABNORMAL HIGH (ref 70–99)

## 2020-10-05 SURGERY — CORONARY ATHERECTOMY
Anesthesia: LOCAL

## 2020-10-05 MED ORDER — SODIUM CHLORIDE 0.9 % IV SOLN: INTRAVENOUS | Status: AC

## 2020-10-05 MED ORDER — LIDOCAINE HCL (PF) 1 % IJ SOLN
INTRAMUSCULAR | Status: AC
  Filled 2020-10-05: qty 30

## 2020-10-05 MED ORDER — HYDRALAZINE HCL 20 MG/ML IJ SOLN: 10.0000 mg | INTRAMUSCULAR | Status: AC | PRN

## 2020-10-05 MED ORDER — TICAGRELOR 90 MG PO TABS: 180.0000 mg | ORAL_TABLET | Freq: Once | ORAL | Status: DC

## 2020-10-05 MED ORDER — ACETAMINOPHEN 325 MG PO TABS: 650.0000 mg | ORAL_TABLET | ORAL | Status: DC | PRN

## 2020-10-05 MED ORDER — TICAGRELOR 90 MG PO TABS
90.0000 mg | ORAL_TABLET | Freq: Two times a day (BID) | ORAL | Status: DC
  Administered 2020-10-06: 90 mg via ORAL
  Filled 2020-10-05: qty 1

## 2020-10-05 MED ORDER — SODIUM CHLORIDE 0.9 % WEIGHT BASED INFUSION
1.0000 mL/kg/h | INTRAVENOUS | Status: DC
  Administered 2020-10-05: 1 mL/kg/h via INTRAVENOUS

## 2020-10-05 MED ORDER — DIPHENHYDRAMINE HCL 25 MG PO CAPS
50.0000 mg | ORAL_CAPSULE | Freq: Once | ORAL | Status: AC
  Administered 2020-10-05: 50 mg via ORAL
  Filled 2020-10-05: qty 2

## 2020-10-05 MED ORDER — SODIUM CHLORIDE 0.9% FLUSH: 3.0000 mL | INTRAVENOUS | Status: DC | PRN

## 2020-10-05 MED ORDER — BIVALIRUDIN TRIFLUOROACETATE 250 MG IV SOLR
INTRAVENOUS | Status: AC
  Filled 2020-10-05: qty 250

## 2020-10-05 MED ORDER — SODIUM CHLORIDE 0.9 % IV SOLN
INTRAVENOUS | Status: AC | PRN
  Administered 2020-10-05 (×3): 1.75 mg/kg/h via INTRAVENOUS

## 2020-10-05 MED ORDER — HEPARIN (PORCINE) IN NACL 1000-0.9 UT/500ML-% IV SOLN
INTRAVENOUS | Status: DC | PRN
  Administered 2020-10-05 (×3): 500 mL

## 2020-10-05 MED ORDER — ASPIRIN 81 MG PO CHEW
81.0000 mg | CHEWABLE_TABLET | ORAL | Status: AC
  Administered 2020-10-05: 81 mg via ORAL

## 2020-10-05 MED ORDER — ATROPINE SULFATE 1 MG/10ML IJ SOSY
PREFILLED_SYRINGE | INTRAMUSCULAR | Status: AC
  Filled 2020-10-05: qty 10

## 2020-10-05 MED ORDER — FENTANYL CITRATE (PF) 100 MCG/2ML IJ SOLN
INTRAMUSCULAR | Status: AC
  Filled 2020-10-05: qty 2

## 2020-10-05 MED ORDER — SODIUM CHLORIDE 0.9 % WEIGHT BASED INFUSION
3.0000 mL/kg/h | INTRAVENOUS | Status: AC
  Administered 2020-10-05: 3 mL/kg/h via INTRAVENOUS

## 2020-10-05 MED ORDER — VIPERSLIDE LUBRICANT OPTIME
TOPICAL | Status: DC | PRN
  Administered 2020-10-05: 50 mL via SURGICAL_CAVITY

## 2020-10-05 MED ORDER — BIVALIRUDIN BOLUS VIA INFUSION - CUPID
INTRAVENOUS | Status: DC | PRN
  Administered 2020-10-05: 103.35 mg via INTRAVENOUS

## 2020-10-05 MED ORDER — HYDROMORPHONE HCL 1 MG/ML IJ SOLN
INTRAMUSCULAR | Status: DC | PRN
  Administered 2020-10-05: 0.5 mg via INTRAVENOUS

## 2020-10-05 MED ORDER — IOHEXOL 350 MG/ML SOLN
INTRAVENOUS | Status: DC | PRN
  Administered 2020-10-05: 160 mL

## 2020-10-05 MED ORDER — TICAGRELOR 90 MG PO TABS
180.0000 mg | ORAL_TABLET | Freq: Once | ORAL | Status: AC
  Administered 2020-10-05: 180 mg via ORAL
  Filled 2020-10-05: qty 2

## 2020-10-05 MED ORDER — MIDAZOLAM HCL 2 MG/2ML IJ SOLN
INTRAMUSCULAR | Status: AC
  Filled 2020-10-05: qty 2

## 2020-10-05 MED ORDER — MIDAZOLAM HCL 2 MG/2ML IJ SOLN
INTRAMUSCULAR | Status: DC | PRN
  Administered 2020-10-05: 2 mg via INTRAVENOUS
  Administered 2020-10-05 (×4): 1 mg via INTRAVENOUS

## 2020-10-05 MED ORDER — NITROGLYCERIN 1 MG/10 ML FOR IR/CATH LAB
INTRA_ARTERIAL | Status: AC
  Filled 2020-10-05: qty 10

## 2020-10-05 MED ORDER — SODIUM CHLORIDE 0.9 % IV SOLN: 250.0000 mL | INTRAVENOUS | Status: DC | PRN

## 2020-10-05 MED ORDER — HEPARIN SODIUM (PORCINE) 1000 UNIT/ML IJ SOLN
INTRAMUSCULAR | Status: AC
  Filled 2020-10-05: qty 1

## 2020-10-05 MED ORDER — BIVALIRUDIN BOLUS VIA INFUSION - CUPID: INTRAVENOUS | Status: DC | PRN

## 2020-10-05 MED ORDER — NITROGLYCERIN 1 MG/10 ML FOR IR/CATH LAB
INTRA_ARTERIAL | Status: DC | PRN
  Administered 2020-10-05: 200 ug via INTRACORONARY
  Administered 2020-10-05: 200 ug
  Administered 2020-10-05 (×5): 200 ug via INTRACORONARY

## 2020-10-05 MED ORDER — ONDANSETRON HCL 4 MG/2ML IJ SOLN: 4.0000 mg | Freq: Four times a day (QID) | INTRAMUSCULAR | Status: DC | PRN

## 2020-10-05 MED ORDER — SODIUM CHLORIDE 0.9 % IV SOLN: INTRAVENOUS | Status: DC

## 2020-10-05 MED ORDER — ASPIRIN 81 MG PO CHEW: 81.0000 mg | CHEWABLE_TABLET | Freq: Every day | ORAL | Status: DC

## 2020-10-05 MED ORDER — FENTANYL CITRATE (PF) 100 MCG/2ML IJ SOLN
INTRAMUSCULAR | Status: DC | PRN
  Administered 2020-10-05 (×3): 25 ug via INTRAVENOUS
  Administered 2020-10-05: 50 ug via INTRAVENOUS

## 2020-10-05 MED ORDER — IOHEXOL 350 MG/ML SOLN
INTRAVENOUS | Status: AC
  Filled 2020-10-05: qty 1

## 2020-10-05 MED ORDER — LIDOCAINE HCL (PF) 1 % IJ SOLN
INTRAMUSCULAR | Status: DC | PRN
  Administered 2020-10-05: 15 mL

## 2020-10-05 MED ORDER — SODIUM CHLORIDE 0.9% FLUSH
3.0000 mL | Freq: Two times a day (BID) | INTRAVENOUS | Status: DC
  Administered 2020-10-05 – 2020-10-06 (×2): 3 mL via INTRAVENOUS

## 2020-10-05 MED ORDER — LABETALOL HCL 5 MG/ML IV SOLN
10.0000 mg | INTRAVENOUS | Status: AC | PRN
  Administered 2020-10-05 (×2): 10 mg via INTRAVENOUS
  Filled 2020-10-05 (×2): qty 4

## 2020-10-05 MED ORDER — HYDROMORPHONE HCL 1 MG/ML IJ SOLN
INTRAMUSCULAR | Status: AC
  Filled 2020-10-05: qty 0.5

## 2020-10-05 MED ORDER — SODIUM CHLORIDE 0.9% FLUSH
3.0000 mL | Freq: Two times a day (BID) | INTRAVENOUS | Status: DC
  Administered 2020-10-05: 3 mL via INTRAVENOUS

## 2020-10-05 MED ORDER — ATORVASTATIN CALCIUM 40 MG PO TABS
40.0000 mg | ORAL_TABLET | Freq: Every day | ORAL | Status: DC
  Administered 2020-10-05 – 2020-10-06 (×2): 40 mg via ORAL
  Filled 2020-10-05 (×2): qty 1

## 2020-10-05 MED ORDER — DIAZEPAM 5 MG PO TABS: 5.0000 mg | ORAL_TABLET | Freq: Four times a day (QID) | ORAL | Status: DC | PRN

## 2020-10-05 SURGICAL SUPPLY — 22 items
BALLN SAPPHIRE 3.0X15 (BALLOONS) ×2
BALLN ~~LOC~~ EMERGE MR 4.0X20 (BALLOONS) ×2
BALLOON SAPPHIRE 3.0X15 (BALLOONS) IMPLANT
BALLOON ~~LOC~~ EMERGE MR 4.0X20 (BALLOONS) IMPLANT
CABLE ADAPT PACING TEMP 12FT (ADAPTER) ×1 IMPLANT
CATH S G BIP PACING (CATHETERS) ×1 IMPLANT
CATHETER LAUNCHER 6FR MP1 (CATHETERS) ×1 IMPLANT
CATHETER LAUNCHER 6FR MP1 SH (CATHETERS) ×1 IMPLANT
CROWN DIAMONDBACK CLASSIC 1.25 (BURR) ×1 IMPLANT
KIT ENCORE 26 ADVANTAGE (KITS) ×1 IMPLANT
KIT HEART LEFT (KITS) ×2 IMPLANT
LUBRICANT VIPERSLIDE CORONARY (MISCELLANEOUS) ×2 IMPLANT
MAT PREVALON FULL STRYKER (MISCELLANEOUS) ×1 IMPLANT
PACK CARDIAC CATHETERIZATION (CUSTOM PROCEDURE TRAY) ×2 IMPLANT
SHEATH PINNACLE 6F 10CM (SHEATH) ×2 IMPLANT
SLEEVE REPOSITIONING LENGTH 30 (MISCELLANEOUS) ×1 IMPLANT
STENT RESOLUTE ONYX 4.0X38 (Permanent Stent) ×1 IMPLANT
TRANSDUCER W/STOPCOCK (MISCELLANEOUS) ×2 IMPLANT
TUBING CIL FLEX 10 FLL-RA (TUBING) ×2 IMPLANT
WIRE COUGAR XT STRL 300CM (WIRE) ×1 IMPLANT
WIRE EMERALD 3MM-J .035X150CM (WIRE) ×1 IMPLANT
WIRE VIPERWIRE COR FLEX .012 (WIRE) ×1 IMPLANT

## 2020-10-05 NOTE — Interval H&P Note (Signed)
Cath Lab Visit (complete for each Cath Lab visit)  Clinical Evaluation Leading to the Procedure:   ACS: No.  Non-ACS:    Anginal Classification: CCS III  Anti-ischemic medical therapy: Maximal Therapy (2 or more classes of medications)  Non-Invasive Test Results: No non-invasive testing performed  Prior CABG: No previous CABG      History and Physical Interval Note:  10/05/2020 3:36 PM  Jerome Shepherd  has presented today for surgery, with the diagnosis of cad.  The various methods of treatment have been discussed with the patient and family. After consideration of risks, benefits and other options for treatment, the patient has consented to  Procedure(s): CORONARY ATHERECTOMY (N/A) TEMPORARY PACEMAKER (N/A) as a surgical intervention.  The patient's history has been reviewed, patient examined, no change in status, stable for surgery.  I have reviewed the patient's chart and labs.  Questions were answered to the patient's satisfaction.     Shelva Majestic

## 2020-10-05 NOTE — Progress Notes (Signed)
Progress Note  Patient Name: Jerome Shepherd Date of Encounter: 10/05/2020  [facility] Cardiologist: Rozann Lesches, MD   Subjective   No chest pain this am.   Inpatient Medications    Scheduled Meds: . aspirin  81 mg Oral Daily  . sodium chloride flush  3 mL Intravenous Q12H  . sodium chloride flush  3 mL Intravenous Q12H   Continuous Infusions: . sodium chloride 150 mL/hr at 10/04/20 2201  . sodium chloride    . sodium chloride    . sodium chloride     PRN Meds: sodium chloride, sodium chloride, acetaminophen, diazepam, ondansetron (ZOFRAN) IV, sodium chloride flush, sodium chloride flush   Vital Signs    Vitals:   10/04/20 1620 10/04/20 1956 10/05/20 0432 10/05/20 0829  BP: (!) 141/73 129/65  (!) 143/87  Pulse: 66 74  65  Resp: 16 18 18 18   Temp: 98.6 F (37 C) 98.6 F (37 C) 98.1 F (36.7 C) 98.9 F (37.2 C)  TempSrc: Oral Oral Oral Oral  SpO2: 98% 97%  96%  Weight:   (!) 137.8 kg   Height:        Intake/Output Summary (Last 24 hours) at 10/05/2020 0955 Last data filed at 10/04/2020 1433 Gross per 24 hour  Intake 1200.07 ml  Output --  Net 1200.07 ml   Last 3 Weights 10/05/2020 10/04/2020 09/28/2020  Weight (lbs) 303 lb 14.4 oz 312 lb 313 lb  Weight (kg) 137.848 kg 141.522 kg 141.976 kg      Telemetry    Sinus - Personally Reviewed  ECG    NSR, septal infarct - Personally Reviewed  Physical Exam   GEN: No acute distress.   Neck: No JVD Cardiac: RRR, no murmurs, rubs, or gallops.  Respiratory: Clear to auscultation bilaterally. GI: Soft, nontender, non-distended  MS: No edema; No deformity. Neuro:  Nonfocal  Psych: Normal affect   Labs    High Sensitivity Troponin:  No results for input(s): TROPONINIHS in the last 720 hours.    Chemistry Recent Labs  Lab 10/02/20 1306 10/05/20 0248  NA 139 138  K 3.9 3.7  CL 105 107  CO2 21* 24  GLUCOSE 138* 167*  BUN 14 10  CREATININE 1.07 1.00  CALCIUM 9.2 8.5*  GFRNONAA >60 >60   ANIONGAP 13 7     Hematology Recent Labs  Lab 10/02/20 1306 10/05/20 0248  WBC 8.4 6.6  RBC 4.85 4.34  HGB 15.7 14.0  HCT 44.8 39.2  MCV 92.4 90.3  MCH 32.4 32.3  MCHC 35.0 35.7  RDW 12.0 11.9  PLT 284 205    BNPNo results for input(s): BNP, PROBNP in the last 168 hours.   DDimer No results for input(s): DDIMER in the last 168 hours.   Radiology    CARDIAC CATHETERIZATION  Result Date: 10/04/2020  Colon Flattery Cx to Prox Cx lesion is 99% stenosed.  Prox Cx to Mid Cx lesion is 100% stenosed.  Ost LM lesion is 30% stenosed.  Mid LM to Dist LM lesion is 30% stenosed.  Mid RCA lesion is 85% stenosed.  Prox RCA-1 lesion is 50% stenosed.  Prox RCA-2 lesion is 60% stenosed.  Prox LAD lesion is 55% stenosed.  RPDA lesion is 80% stenosed.  The left ventricular systolic function is normal.  The left ventricular ejection fraction is 50-55% by visual estimate.  LV end diastolic pressure is low.  Multivessel CAD with smooth ostial narrowing of the left main and smooth distal left main stenosis  prior to trifurcating into an LAD, ramus immediate vessel and high marginal vessel. The LAD has very proximal 50 to 60% irregularity initiating just beyond the ostium. The ramus immediate vessel is normal. 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 Large calcified dominant right coronary artery with superior takeoff and diffuse 5060% proximal stenosis, 90% stenosis in a small RV branch followed by at least 80% assist with an apparent chunk of calcium in this stenosis.  There is mild luminal irregularity of the RCA and it appears that there is very diffuse distal PDA stenosis of approximately 80% in  a small caliber distal PDA. Low normal global LV function with EF estimated 50 to 55%.  There is a small focal area of mid anterolateral hypocontractility most likely contributed by the AV groove circumflex occlusion.  LVEDP 7 mm Hg.  RECOMMENDATION: Angiograms will be reviewed with colleagues.  The RCA is diffusely diseased and appears to have significant calcification with very focal 85% calcified mid stenosis.  We will hydrate the patient following his catheterization which required increased contrast load secondary to difficulty in engaging his coronary arteries.  Tentative plan will be for atherectomy/PCI to the RCA tomorrow.   ECHOCARDIOGRAM COMPLETE  Result Date: 10/04/2020    ECHOCARDIOGRAM REPORT   Patient Name:   Jerome Shepherd Date of Exam: 10/04/2020 Medical Rec #:  [id number]      Height:       75.0 in Accession #:    [id number]     Weight:       312.0 lb Date of Birth:  1966/05/15      BSA:          2.651 m Patient Age:    55 years       BP:           138/58 mmHg Patient Gender: M              HR:           62 bpm. Exam Location:  Inpatient Procedure: 2D Echo Indications:    CAD of native vessel  History:        Patient has no prior history of Echocardiogram examinations.                 CAD, Signs/Symptoms:Chest Pain; Risk Factors:Diabetes and                 Hypertension.  Sonographer:    Johny Chess Referring Phys: [place] Comments: Image acquisition challenging due to patient body habitus. IMPRESSIONS  1. Left ventricular ejection fraction, by estimation, is 50 to 55%. The left ventricle has low normal function. The left ventricle has no regional wall motion abnormalities. There is mild concentric left ventricular hypertrophy. Left ventricular diastolic parameters are consistent with Grade II diastolic dysfunction (pseudonormalization).  2. Right ventricular systolic function is normal. The right ventricular size is normal. Tricuspid regurgitation signal is inadequate for assessing PA pressure.  3. The mitral valve is normal in structure. Trivial mitral valve regurgitation.  4. The aortic valve is tricuspid. Aortic valve regurgitation is trivial. No aortic stenosis is present. Comparison(s): No  prior Echocardiogram. FINDINGS  Left Ventricle: Left ventricular ejection fraction, by estimation, is 50 to 55%. The left ventricle has low normal function. The left ventricle has no regional wall motion abnormalities. The left ventricular internal cavity size was normal in size. There is mild concentric left  ventricular hypertrophy. Left ventricular diastolic parameters are consistent with Grade II diastolic dysfunction (pseudonormalization). Right Ventricle: The right ventricular size is normal. Right vetricular wall thickness was not well visualized. Right ventricular systolic function is normal. Tricuspid regurgitation signal is inadequate for assessing PA pressure. Left Atrium: Left atrial size was normal in size. Right Atrium: Right atrial size was normal in size. Pericardium: There is no evidence of pericardial effusion. Mitral Valve: The mitral valve is normal in structure. Trivial mitral valve regurgitation. Tricuspid Valve: The tricuspid valve is normal in structure. Tricuspid valve regurgitation is trivial. Aortic Valve: The aortic valve is tricuspid. Aortic valve regurgitation is trivial. No aortic stenosis is present. Pulmonic Valve: The pulmonic valve was normal in structure. Pulmonic valve regurgitation is not visualized. Aorta: The aortic root and ascending aorta are structurally normal, with no evidence of dilitation. IAS/Shunts: No atrial level shunt detected by color flow Doppler.  LEFT VENTRICLE PLAX 2D LVIDd:         5.00 cm      Diastology LVIDs:         3.50 cm      LV e' medial:    6.74 cm/s LV PW:         1.00 cm      LV E/e' medial:  10.9 LV IVS:        1.10 cm      LV e' lateral:   12.10 cm/s LVOT diam:     2.10 cm      LV E/e' lateral: 6.1 LV SV:         72 LV SV Index:   27 LVOT Area:     3.46 cm  LV Volumes (MOD) LV vol d, MOD A4C: 135.0 ml LV vol s, MOD A4C: 61.6 ml LV SV MOD A4C:     135.0 ml RIGHT VENTRICLE             IVC RV S prime:     12.30 cm/s  IVC diam: 1.60 cm TAPSE  (M-mode): 2.0 cm LEFT ATRIUM             Index       RIGHT ATRIUM           Index LA diam:        4.30 cm 1.62 cm/m  RA Area:     14.90 cm LA Vol (A2C):   55.7 ml 21.01 ml/m RA Volume:   35.30 ml  13.32 ml/m LA Vol (A4C):   69.8 ml 26.33 ml/m LA Biplane Vol: 64.6 ml 24.37 ml/m  AORTIC VALVE LVOT Vmax:   83.60 cm/s LVOT Vmean:  57.800 cm/s LVOT VTI:    0.209 m  AORTA Ao Root diam: 3.50 cm Ao Asc diam:  3.30 cm MITRAL VALVE MV Area (PHT): 3.77 cm    SHUNTS MV Decel Time: 201 msec    Systemic VTI:  0.21 m MV E velocity: 73.70 cm/s  Systemic Diam: 2.10 cm MV A velocity: 64.70 cm/s MV E/A ratio:  1.14 Gwyndolyn Kaufman MD Electronically signed by Gwyndolyn Kaufman MD Signature Date/Time: 10/04/2020/5:16:14 PM    Final     Cardiac Studies   See above  Patient Profile     55 y.o. male with history of CAD, HTN, HLD, GERD, DM admitted post cath. He has had recent chest pain concerning for unstable angina. Cardiac cath as above per Dr. Claiborne Billings 10/04/20 with multi-vessel CAD. Per Dr. Claiborne Billings, plans for RCA atherectomy and stenting today.   Assessment &  Plan    1. CAD with unstable angina: Plans for PCI of the RCA today with atherectomy and stenting per Dr, Claiborne Billings. Plavix will be loaded in the cath lab once Dr. Claiborne Billings makes a final decision on planning for PCI. Continue ASA. Will start a statin. Labs reviewed and ok for PCI. Questions answered.   For questions or updates, please contact [place] Please consult [email or web address] for contact info under        Signed, Lauree Chandler, MD  10/05/2020, 9:55 AM

## 2020-10-05 NOTE — H&P (View-Only) (Signed)
Progress Note  Patient Name: Jerome Shepherd Date of Encounter: 10/05/2020  [facility] Cardiologist: Rozann Lesches, MD   Subjective   No chest pain this am.   Inpatient Medications    Scheduled Meds: . aspirin  81 mg Oral Daily  . sodium chloride flush  3 mL Intravenous Q12H  . sodium chloride flush  3 mL Intravenous Q12H   Continuous Infusions: . sodium chloride 150 mL/hr at 10/04/20 2201  . sodium chloride    . sodium chloride    . sodium chloride     PRN Meds: sodium chloride, sodium chloride, acetaminophen, diazepam, ondansetron (ZOFRAN) IV, sodium chloride flush, sodium chloride flush   Vital Signs    Vitals:   10/04/20 1620 10/04/20 1956 10/05/20 0432 10/05/20 0829  BP: (!) 141/73 129/65  (!) 143/87  Pulse: 66 74  65  Resp: 16 18 18 18   Temp: 98.6 F (37 C) 98.6 F (37 C) 98.1 F (36.7 C) 98.9 F (37.2 C)  TempSrc: Oral Oral Oral Oral  SpO2: 98% 97%  96%  Weight:   (!) 137.8 kg   Height:        Intake/Output Summary (Last 24 hours) at 10/05/2020 0955 Last data filed at 10/04/2020 1433 Gross per 24 hour  Intake 1200.07 ml  Output --  Net 1200.07 ml   Last 3 Weights 10/05/2020 10/04/2020 09/28/2020  Weight (lbs) 303 lb 14.4 oz 312 lb 313 lb  Weight (kg) 137.848 kg 141.522 kg 141.976 kg      Telemetry    Sinus - Personally Reviewed  ECG    NSR, septal infarct - Personally Reviewed  Physical Exam   GEN: No acute distress.   Neck: No JVD Cardiac: RRR, no murmurs, rubs, or gallops.  Respiratory: Clear to auscultation bilaterally. GI: Soft, nontender, non-distended  MS: No edema; No deformity. Neuro:  Nonfocal  Psych: Normal affect   Labs    High Sensitivity Troponin:  No results for input(s): TROPONINIHS in the last 720 hours.    Chemistry Recent Labs  Lab 10/02/20 1306 10/05/20 0248  NA 139 138  K 3.9 3.7  CL 105 107  CO2 21* 24  GLUCOSE 138* 167*  BUN 14 10  CREATININE 1.07 1.00  CALCIUM 9.2 8.5*  GFRNONAA >60 >60   ANIONGAP 13 7     Hematology Recent Labs  Lab 10/02/20 1306 10/05/20 0248  WBC 8.4 6.6  RBC 4.85 4.34  HGB 15.7 14.0  HCT 44.8 39.2  MCV 92.4 90.3  MCH 32.4 32.3  MCHC 35.0 35.7  RDW 12.0 11.9  PLT 284 205    BNPNo results for input(s): BNP, PROBNP in the last 168 hours.   DDimer No results for input(s): DDIMER in the last 168 hours.   Radiology    CARDIAC CATHETERIZATION  Result Date: 10/04/2020  Colon Flattery Cx to Prox Cx lesion is 99% stenosed.  Prox Cx to Mid Cx lesion is 100% stenosed.  Ost LM lesion is 30% stenosed.  Mid LM to Dist LM lesion is 30% stenosed.  Mid RCA lesion is 85% stenosed.  Prox RCA-1 lesion is 50% stenosed.  Prox RCA-2 lesion is 60% stenosed.  Prox LAD lesion is 55% stenosed.  RPDA lesion is 80% stenosed.  The left ventricular systolic function is normal.  The left ventricular ejection fraction is 50-55% by visual estimate.  LV end diastolic pressure is low.  Multivessel CAD with smooth ostial narrowing of the left main and smooth distal left main stenosis  prior to trifurcating into an LAD, ramus immediate vessel and high marginal vessel. The LAD has very proximal 50 to 60% irregularity initiating just beyond the ostium. The ramus immediate vessel is normal. 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 Large calcified dominant right coronary artery with superior takeoff and diffuse 5060% proximal stenosis, 90% stenosis in a small RV branch followed by at least 80% assist with an apparent chunk of calcium in this stenosis.  There is mild luminal irregularity of the RCA and it appears that there is very diffuse distal PDA stenosis of approximately 80% in  a small caliber distal PDA. Low normal global LV function with EF estimated 50 to 55%.  There is a small focal area of mid anterolateral hypocontractility most likely contributed by the AV groove circumflex occlusion.  LVEDP 7 mm Hg.  RECOMMENDATION: Angiograms will be reviewed with colleagues.  The RCA is diffusely diseased and appears to have significant calcification with very focal 85% calcified mid stenosis.  We will hydrate the patient following his catheterization which required increased contrast load secondary to difficulty in engaging his coronary arteries.  Tentative plan will be for atherectomy/PCI to the RCA tomorrow.   ECHOCARDIOGRAM COMPLETE  Result Date: 10/04/2020    ECHOCARDIOGRAM REPORT   Patient Name:   Jerome Shepherd Date of Exam: 10/04/2020 Medical Rec #:  [id number]      Height:       75.0 in Accession #:    [id number]     Weight:       312.0 lb Date of Birth:  20-Jan-1966      BSA:          2.651 m Patient Age:    55 years       BP:           138/58 mmHg Patient Gender: M              HR:           62 bpm. Exam Location:  Inpatient Procedure: 2D Echo Indications:    CAD of native vessel  History:        Patient has no prior history of Echocardiogram examinations.                 CAD, Signs/Symptoms:Chest Pain; Risk Factors:Diabetes and                 Hypertension.  Sonographer:    Johny Chess Referring Phys: [place] Comments: Image acquisition challenging due to patient body habitus. IMPRESSIONS  1. Left ventricular ejection fraction, by estimation, is 50 to 55%. The left ventricle has low normal function. The left ventricle has no regional wall motion abnormalities. There is mild concentric left ventricular hypertrophy. Left ventricular diastolic parameters are consistent with Grade II diastolic dysfunction (pseudonormalization).  2. Right ventricular systolic function is normal. The right ventricular size is normal. Tricuspid regurgitation signal is inadequate for assessing PA pressure.  3. The mitral valve is normal in structure. Trivial mitral valve regurgitation.  4. The aortic valve is tricuspid. Aortic valve regurgitation is trivial. No aortic stenosis is present. Comparison(s): No  prior Echocardiogram. FINDINGS  Left Ventricle: Left ventricular ejection fraction, by estimation, is 50 to 55%. The left ventricle has low normal function. The left ventricle has no regional wall motion abnormalities. The left ventricular internal cavity size was normal in size. There is mild concentric left  ventricular hypertrophy. Left ventricular diastolic parameters are consistent with Grade II diastolic dysfunction (pseudonormalization). Right Ventricle: The right ventricular size is normal. Right vetricular wall thickness was not well visualized. Right ventricular systolic function is normal. Tricuspid regurgitation signal is inadequate for assessing PA pressure. Left Atrium: Left atrial size was normal in size. Right Atrium: Right atrial size was normal in size. Pericardium: There is no evidence of pericardial effusion. Mitral Valve: The mitral valve is normal in structure. Trivial mitral valve regurgitation. Tricuspid Valve: The tricuspid valve is normal in structure. Tricuspid valve regurgitation is trivial. Aortic Valve: The aortic valve is tricuspid. Aortic valve regurgitation is trivial. No aortic stenosis is present. Pulmonic Valve: The pulmonic valve was normal in structure. Pulmonic valve regurgitation is not visualized. Aorta: The aortic root and ascending aorta are structurally normal, with no evidence of dilitation. IAS/Shunts: No atrial level shunt detected by color flow Doppler.  LEFT VENTRICLE PLAX 2D LVIDd:         5.00 cm      Diastology LVIDs:         3.50 cm      LV e' medial:    6.74 cm/s LV PW:         1.00 cm      LV E/e' medial:  10.9 LV IVS:        1.10 cm      LV e' lateral:   12.10 cm/s LVOT diam:     2.10 cm      LV E/e' lateral: 6.1 LV SV:         72 LV SV Index:   27 LVOT Area:     3.46 cm  LV Volumes (MOD) LV vol d, MOD A4C: 135.0 ml LV vol s, MOD A4C: 61.6 ml LV SV MOD A4C:     135.0 ml RIGHT VENTRICLE             IVC RV S prime:     12.30 cm/s  IVC diam: 1.60 cm TAPSE  (M-mode): 2.0 cm LEFT ATRIUM             Index       RIGHT ATRIUM           Index LA diam:        4.30 cm 1.62 cm/m  RA Area:     14.90 cm LA Vol (A2C):   55.7 ml 21.01 ml/m RA Volume:   35.30 ml  13.32 ml/m LA Vol (A4C):   69.8 ml 26.33 ml/m LA Biplane Vol: 64.6 ml 24.37 ml/m  AORTIC VALVE LVOT Vmax:   83.60 cm/s LVOT Vmean:  57.800 cm/s LVOT VTI:    0.209 m  AORTA Ao Root diam: 3.50 cm Ao Asc diam:  3.30 cm MITRAL VALVE MV Area (PHT): 3.77 cm    SHUNTS MV Decel Time: 201 msec    Systemic VTI:  0.21 m MV E velocity: 73.70 cm/s  Systemic Diam: 2.10 cm MV A velocity: 64.70 cm/s MV E/A ratio:  1.14 Gwyndolyn Kaufman MD Electronically signed by Gwyndolyn Kaufman MD Signature Date/Time: 10/04/2020/5:16:14 PM    Final     Cardiac Studies   See above  Patient Profile     55 y.o. male with history of CAD, HTN, HLD, GERD, DM admitted post cath. He has had recent chest pain concerning for unstable angina. Cardiac cath as above per Dr. Claiborne Billings 10/04/20 with multi-vessel CAD. Per Dr. Claiborne Billings, plans for RCA atherectomy and stenting today.   Assessment &  Plan    1. CAD with unstable angina: Plans for PCI of the RCA today with atherectomy and stenting per Dr, Claiborne Billings. Plavix will be loaded in the cath lab once Dr. Claiborne Billings makes a final decision on planning for PCI. Continue ASA. Will start a statin. Labs reviewed and ok for PCI. Questions answered.   For questions or updates, please contact [place] Please consult [email or web address] for contact info under        Signed, Lauree Chandler, MD  10/05/2020, 9:55 AM

## 2020-10-06 ENCOUNTER — Other Ambulatory Visit (HOSPITAL_COMMUNITY): Payer: Self-pay

## 2020-10-06 ENCOUNTER — Encounter (HOSPITAL_COMMUNITY): Payer: Self-pay | Admitting: Cardiovascular Disease

## 2020-10-06 DIAGNOSIS — I200 Unstable angina: Secondary | ICD-10-CM | POA: Diagnosis not present

## 2020-10-06 LAB — LIPID PANEL
Cholesterol: 118 mg/dL (ref 0–200)
HDL: 31 mg/dL — ABNORMAL LOW (ref >40)
LDL Cholesterol: 46 mg/dL (ref 0–99)
Total CHOL/HDL Ratio: 3.8 RATIO
Triglycerides: 205 mg/dL — ABNORMAL HIGH (ref <150)
VLDL: 41 mg/dL — ABNORMAL HIGH (ref 0–40)

## 2020-10-06 LAB — CBC
HCT: 39 % (ref 39.0–52.0)
Hemoglobin: 14 g/dL (ref 13.0–17.0)
MCH: 32.8 pg (ref 26.0–34.0)
MCHC: 35.9 g/dL (ref 30.0–36.0)
MCV: 91.3 fL (ref 80.0–100.0)
Platelets: 225 10*3/uL (ref 150–400)
RBC: 4.27 MIL/uL (ref 4.22–5.81)
RDW: 11.9 % (ref 11.5–15.5)
WBC: 8.3 10*3/uL (ref 4.0–10.5)
nRBC: 0 % (ref 0.0–0.2)

## 2020-10-06 LAB — BASIC METABOLIC PANEL
Anion gap: 9 (ref 5–15)
BUN: 8 mg/dL (ref 6–20)
CO2: 25 mmol/L (ref 22–32)
Calcium: 9 mg/dL (ref 8.9–10.3)
Chloride: 105 mmol/L (ref 98–111)
Creatinine, Ser: 0.95 mg/dL (ref 0.61–1.24)
GFR, Estimated: >60 mL/min (ref >60)
Glucose, Bld: 182 mg/dL — ABNORMAL HIGH (ref 70–99)
Potassium: 4.1 mmol/L (ref 3.5–5.1)
Sodium: 139 mmol/L (ref 135–145)

## 2020-10-06 LAB — GLUCOSE, CAPILLARY
Glucose-Capillary: 208 mg/dL — ABNORMAL HIGH (ref 70–99)
Glucose-Capillary: 208 mg/dL — ABNORMAL HIGH (ref 70–99)

## 2020-10-06 MED ORDER — CARVEDILOL 3.125 MG PO TABS
3.1250 mg | ORAL_TABLET | Freq: Two times a day (BID) | ORAL | 11 refills | Status: DC
  Filled 2020-10-06: qty 60, 30d supply, fill #0

## 2020-10-06 MED ORDER — CARVEDILOL 3.125 MG PO TABS: 3.1250 mg | ORAL_TABLET | Freq: Two times a day (BID) | ORAL | Status: DC

## 2020-10-06 MED ORDER — DAPAGLIFLOZIN PROPANEDIOL 10 MG PO TABS
10.0000 mg | ORAL_TABLET | Freq: Every day | ORAL | 11 refills | Status: AC
  Filled 2020-10-06: qty 30, 30d supply, fill #0

## 2020-10-06 MED ORDER — TICAGRELOR 90 MG PO TABS
90.0000 mg | ORAL_TABLET | Freq: Two times a day (BID) | ORAL | 3 refills | Status: DC
  Filled 2020-10-06: qty 60, 30d supply, fill #0

## 2020-10-06 MED ORDER — ATORVASTATIN CALCIUM 40 MG PO TABS
40.0000 mg | ORAL_TABLET | Freq: Every day | ORAL | 3 refills | Status: DC
  Filled 2020-10-06: qty 30, 30d supply, fill #0

## 2020-10-06 MED ORDER — DAPAGLIFLOZIN PROPANEDIOL 10 MG PO TABS: 10.0000 mg | ORAL_TABLET | Freq: Every day | ORAL | 11 refills | Status: DC

## 2020-10-06 MED FILL — Nitroglycerin IV Soln 100 MCG/ML in D5W: INTRA_ARTERIAL | Qty: 10 | Status: AC

## 2020-10-06 NOTE — Progress Notes (Signed)
CARDIAC REHAB PHASE I   PRE:  Rate/Rhythm: 70 SR    BP: sitting 154/81    SaO2: 97 RA  MODE:  Ambulation: 340 ft   POST:  Rate/Rhythm: 92 SR    BP: sitting 137/68     SaO2: 99 RA  Pt sts he has had intermittent need to take deep breath. Able to walk hall but did feel SOB toward end of walk. Sts this is different than the random SOB he was having in the bed. No chest tightness walking. Feels he has probably gotten deconditioned. Pt doesn't drink caffeine due to reflux.  Discussed stent, restrictions, Brilinta importance, diet, exercise, NTG, and CRPII. Pt receptive, knows he needs diet work. Will refer to [place] however pt will not be able to do due to work. [place], ACSM 10/06/2020 8:40 AM

## 2020-10-06 NOTE — Progress Notes (Signed)
After patient was educated, right femoral arterial and venous sheaths were removed and pressure held for 25 minutes for hemostasis. Distal pulses palpable through out sheath pull. Patient tolerated procedure well and there no complications. Rn instructed on how to monitor site.

## 2020-10-06 NOTE — Care Management (Signed)
10-06-20 1004 Brilinta co pay card to be provided by the RN. No further needs from Case Manager. Bethena Roys, RN,BSN Case Manager

## 2020-10-06 NOTE — Discharge Instructions (Signed)
Angina  Angina is discomfort in the chest, neck, arm, jaw, or back. The discomfort is caused by a lack of blood in the middle layer of the heart wall (myocardium). There are four types of angina:  Stable angina. This is triggered by vigorous activity or exercise. It goes away when you rest or take medicines that treat angina. This is diagnosed if you have had the symptom for more than 2 months.  Unstable angina. This is a warning sign and can lead to a heart attack. This is a medical emergency. Symptoms come at rest and last a long time.  Microvascular angina. This affects the small coronary arteries. Symptoms include chest pain, feeling tired, and being short of breath. The symptoms can last a long time or short time.  Prinzmetal or variant angina. This is caused by a spasm of the arteries that go to your heart. What are the causes? This condition is usually caused by atherosclerosis. This is the buildup of fat and cholesterol (plaque) in your arteries. The plaque may narrow or block the artery. Other causes of angina include:  Sudden spasms of the muscles of the arteries in the heart.  Small artery disease (microvascular dysfunction).  Problems with any of your heart valves.  A tear in an artery in your heart (coronary artery dissection).  Weakness of the heart muscle (cardiomyopathy). What increases the risk? You are more likely to develop this condition if you have:  High cholesterol.  High blood pressure.  Diabetes.  A family history of heart disease.  A sedentary lifestyle, or a lifestyle in which you do not exercise enough.  Depression.  Had radiation treatment to the left side of your chest. Other risk factors include:  Using tobacco.  Being obese.  Eating a diet high in saturated fats.  Being exposed to high stress or triggers of stress.  Using drugs, such as cocaine. Women have a greater risk for angina if they:  Are older than age 3.  Have gone  through menopause. What are the signs or symptoms? Common symptoms of this condition in both men and women may include:  Chest pain, which may: ? Feel like a crushing or squeezing in the chest, or a tightness, pressure, fullness, or heaviness in the chest. ? Last for more than a few minutes, or stop and come back over a few minutes.  Pain in the neck, arm, jaw, or back.  Unexplained heartburn or indigestion.  Shortness of breath.  Nausea.  Sudden cold sweats. Women and people with diabetes may have unusual (atypical) symptoms, such as:  Fatigue.  Unexplained feelings of nervousness or anxiety.  Unexplained weakness.  Dizziness or fainting. How is this diagnosed? This condition may be diagnosed based on:  Your symptoms and medical history.  Electrocardiogram (ECG) to measure the electrical activity in your heart.  Blood tests.  Stress test to look for signs of blockage when your heart is stressed.  CT angiogram to examine your heart and the blood flow to it.  Coronary angiogram to check for arterial blockage.  Echocardiogram (ultrasound) to assess the strength of your heartbeat. How is this treated? Angina may be treated with:  Medicines to: ? Prevent blood clots and heart attack. ? Relax blood vessels and improve blood flow to the heart. ? Reduce blood pressure, improve heart pumping, and relax blood vessels spasms. ? Reduce cholesterol and help treat atherosclerosis.  A procedure to widen a narrowed or blocked coronary artery (angioplasty). A mesh tube (stent) may  be placed in a coronary artery to keep it open.  Surgery to allow blood to go around a blocked artery (coronary artery bypass surgery). Follow these instructions at home: Medicines  Take over-the-counter and prescription medicines only as told by your health care provider.  Do not take the following medicines unless your health care provider approves: ? NSAIDs, such as ibuprofen or  naproxen. ? Vitamin supplements that contain vitamin A, vitamin E, or both. ? Hormone replacement therapy that contains estrogen with or without progestin. Eating and drinking  Eat a heart-healthy diet. This includes plenty of fresh fruits and vegetables, whole grains, low-fat (lean) protein, and low-fat dairy products.  Follow instructions from your health care provider about eating or drinking restrictions.   Activity  Follow an exercise program approved by your health care provider.  Consider joining a cardiac rehabilitation program.  Take a break when you feel fatigued. Plan rest periods in your daily activities. Lifestyle  Do not use any products that contain nicotine or tobacco. These products include cigarettes, chewing tobacco, and vaping devices, such as e-cigarettes. If you need help quitting, ask your health care provider.  If your health care provider says you can drink alcohol: ? Limit how much you have to:  0-1 drink a day for women who are not pregnant.  0-2 drinks a day for men. ? Be aware of how much alcohol is in your drink. In the [place] one drink equals one 12 oz bottle of beer (355 mL), one 5 oz glass of wine (148 mL), or one 1 oz glass of hard liquor (44 mL).   General instructions  Maintain a healthy weight.  Learn to manage stress.  Keep your vaccinations up to date. Get the flu (influenza) vaccine every year.  Talk to your health care provider if you feel depressed. Take a depression screening test to see if you are at risk for depression.  Work with your health care provider to manage other health conditions, such as hypertension or diabetes.  Keep all follow-up visits. This is important. Get help right away if:  You have pain in your chest, neck, arm, jaw, or back, and the pain: ? Lasts more than a few minutes. ? Is recurring. ? Is not relieved by taking medicines under the tongue (sublingual nitroglycerin). ? Increases in intensity or  frequency.  You have a lot of sweating without cause.  You have unexplained: ? Heartburn or indigestion. ? Shortness of breath or difficulty breathing. ? Nausea or vomiting. ? Fatigue. ? Feelings of nervousness or anxiety. ? Weakness.  You have sudden light-headedness or dizziness.  You faint. These symptoms may represent a serious problem that is an emergency. Do not wait to see if the symptoms will go away. Get medical help right away. Call your local emergency services (911 in the [place] Do not drive yourself to the hospital. Summary  Angina is discomfort in the chest, neck, arm, jaw, or back that is caused by a lack of blood in the arteries of the heart wall.  There are many symptoms of angina. They include chest pain, unexplained heartburn or indigestion, sudden cold sweats, and fatigue.  Angina may be treated with lifestyle changes, medicines, or surgery.  Symptoms of angina may represent an emergency. Get medical help right away. Call your local emergency services (911 in the [place] Do not drive yourself to the hospital. This information is not intended to replace advice given to you by your health care provider. Make sure you  discuss any questions you have with your health care provider. Document Revised: 12/03/2019 Document Reviewed: 12/03/2019 [employer] Patient Education  2021 [employer].

## 2020-10-06 NOTE — Discharge Summary (Addendum)
Discharge Summary    Patient ID: Jerome Shepherd MRN: [id number]; DOB: Oct 19, 1965  Admit date: 10/04/2020 Discharge date: 10/06/2020  PCP:  Glenda Chroman, MD   [place]  Cardiologist:  Rozann Lesches, MD    Discharge Diagnoses    Active Problems:   Accelerating angina [facility]) CAD Hypertension Hyperlipidemia Diabetes mellitus   Diagnostic Studies/Procedures    CORONARY ATHERECTOMY  10/05/2020  TEMPORARY PACEMAKER   Conclusion    Ost LM lesion is 30% stenosed.  Mid LM to Dist LM lesion is 30% stenosed.  Prox LAD lesion is 55% stenosed.  Ost Cx to Prox Cx lesion is 99% stenosed.  Prox Cx to Mid Cx lesion is 100% stenosed.  Mid RCA lesion is 85% stenosed.  Prox RCA-1 lesion is 50% stenosed.  Prox RCA-2 lesion is 60% stenosed.  A stent was successfully placed.  Post intervention, there is a 0% residual stenosis.  Post intervention, there is a 0% residual stenosis.  Post intervention, there is a 0% residual stenosis.   Difficult but successful percutaneous coronary intervention of a diffusely calcified proximal to mid RCA with significant luminal calcification in the mid RCA with ultimate successful CSI coronary atherectomy, PTCA, and stenting with a 4.0 x 38 mm Resolute Onyx DES stent postdilated to 4.1 mm with the entire proximal to mid stenoses being reduced to 0%.  At the end of the procedure, there was brisk flow distally was now a normal-appearing distal PDA compared to the diagnostic study from yesterday.  Temporary transvenous pacemaker insertion paced prior to atherectomy with utilization during the procedure.  RECOMMENDATION: DAPT for a minimum of a year.  Medical therapy for concomitant CAD with total occlusion of the AV groove circumflex, 30% ostial and distal left main stenoses, and 50 to 60% proximal LAD stenoses. Optimal blood pressure control, lipid management, and weight loss.   Diagnostic Dominance:  Right    Intervention     Echo 10/04/2020 1. Left ventricular ejection fraction, by estimation, is 50 to 55%. The  left ventricle has low normal function. The left ventricle has no regional  wall motion abnormalities. There is mild concentric left ventricular  hypertrophy. Left ventricular  diastolic parameters are consistent with Grade II diastolic dysfunction  (pseudonormalization).  2. Right ventricular systolic function is normal. The right ventricular  size is normal. Tricuspid regurgitation signal is inadequate for assessing  PA pressure.  3. The mitral valve is normal in structure. Trivial mitral valve  regurgitation.  4. The aortic valve is tricuspid. Aortic valve regurgitation is trivial.  No aortic stenosis is present.   LEFT HEART CATH AND CORONARY ANGIOGRAPHY  10/04/20    Conclusion    Ost Cx to Prox Cx lesion is 99% stenosed.  Prox Cx to Mid Cx lesion is 100% stenosed.  Ost LM lesion is 30% stenosed.  Mid LM to Dist LM lesion is 30% stenosed.  Mid RCA lesion is 85% stenosed.  Prox RCA-1 lesion is 50% stenosed.  Prox RCA-2 lesion is 60% stenosed.  Prox LAD lesion is 55% stenosed.  RPDA lesion is 80% stenosed.  The left ventricular systolic function is normal.  The left ventricular ejection fraction is 50-55% by visual estimate.  LV end diastolic pressure is low.   Multivessel CAD with smooth ostial narrowing of the left main and smooth distal left main stenosis prior to trifurcating into an LAD, ramus immediate vessel and high marginal vessel.  The LAD has very proximal 50 to 60% irregularity initiating  just beyond the ostium.  The ramus immediate vessel is normal.  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  Large calcified dominant right coronary artery with superior takeoff and diffuse 5060% proximal stenosis, 90% stenosis in a small RV branch followed by at  least 80% assist with an apparent chunk of calcium in this stenosis.  There is mild luminal irregularity of the RCA and it appears that there is very diffuse distal PDA stenosis of approximately 80% in  a small caliber distal PDA.  Low normal global LV function with EF estimated 50 to 55%.  There is a small focal area of mid anterolateral hypocontractility most likely contributed by the AV groove circumflex occlusion.  LVEDP 7 mm Hg.  RECOMMENDATION: Angiograms will be reviewed with colleagues.  The RCA is diffusely diseased and appears to have significant calcification with very focal 85% calcified mid stenosis.  We will hydrate the patient following his catheterization which required increased contrast load secondary to difficulty in engaging his coronary arteries.  Tentative plan will be for atherectomy/PCI to the RCA tomorrow.   History of Present Illness     Jerome Shepherd is a 55 y.o. male with history of CAD, HTN, HLD, GERD, DM and morbid obesity admitted post cath.   Cardiac catheterization in 2013 demonstrated only mild atherosclerosis with suspected coronary vasospasm. Exercise Myoview 01/2020 showed moderate reversibility is seen involving the inferior myocardium toward the base of the heart consistent with ischemia. This was intermediate risk study. He was managed medically.   Seen by Dr. Domenic Polite 09/28/20 with 4-6 weeks hx of recurring episodes of chest tightness, usually central but sometimes upper chest.  Feels short of breath more so than usual with typical activities, even walking.    Given symptoms concerning for unstable angina cardiac catheterization recommended.  Hospital Course     Consultants: None  1. CAD - Cardiac cath by Dr. Claiborne Billings 10/04/20 as noted above and multivessel CAD.  Angiogram reviewed with colleagues.  He was admitted and hydrated as he got increased contrast load during cardiac catheterization. Patient underwent staged difficult but successful percutaneous  coronary intervention of a diffusely calcified proximal to mid RCA with significant luminal calcification in the mid RCA with ultimate successful CSI coronary atherectomy, PTCA, and stenting with a 4.0 x 38 mm Resolute Onyx DES stent postdilated to 4.1 mm with the entire proximal to mid stenoses being reduced to 0%.  At the end of the procedure, there was brisk flow distally was now a normal-appearing distal PDA compared to the diagnostic study.  - DAPT with ASA and Brillinta for one year -Patient has mild intermittent shortness of breath which felt like side effect of Brilinta.  Advised to take with coffee/caffeine. - Added BB - Continue Statin   2. HLD - 10/06/2020: Cholesterol 118; HDL 31; LDL Cholesterol 46; Triglycerides 205; VLDL 41  -Simvastatin changed to Lipitor 40 mg -Consider lipid panel in outpatient setting  3.  Hypertension -Blood pressure relatively stable -Home amlodipine changed to Coreg -Resume home lisinopril  4.  Chronic diastolic heart failure  - Echo showed 50 to 55%, no wall motion abnormality and grade 2 diastolic dysfunction - LVEDP was 17 by cath on 10/04/20 - Does not appeared volume overloaded  5. DM - Treated with SSI while admitted.  - Started on Farxiga at discharge  Did the patient have an acute coronary syndrome (MI, NSTEMI, STEMI, etc) this admission?:  No  Did the patient have a percutaneous coronary intervention (stent / angioplasty)?:  Yes.     Cath/PCI Registry Performance & Quality Measures: 1. Aspirin prescribed? - Yes 2. ADP Receptor Inhibitor (Plavix/Clopidogrel, Brilinta/Ticagrelor or Effient/Prasugrel) prescribed (includes medically managed patients)? - Yes 3. High Intensity Statin (Lipitor 40-31m or Crestor 20-472m prescribed? - Yes 4. For EF <40%, was ACEI/ARB prescribed? - Yes 5. For EF <40%, Aldosterone Antagonist (Spironolactone or Eplerenone) prescribed? - Not Applicable (EF >/= 4000%6. Cardiac Rehab  Phase II ordered? - Yes   Discharge Vitals Blood pressure (!) 154/81, pulse 68, temperature 98 F (36.7 C), temperature source Oral, resp. rate 15, height 6' 3"  (1.905 m), weight (!) 136.1 kg, SpO2 96 %.  Filed Weights   10/04/20 0807 10/05/20 0432 10/06/20 0612  Weight: (!) 141.5 kg (!) 137.8 kg (!) 136.1 kg   Physical Exam Constitutional:      Appearance: Normal appearance.  HENT:     Head: Normocephalic and atraumatic.  Eyes:     Extraocular Movements: Extraocular movements intact.     Pupils: Pupils are equal, round, and reactive to light.  Cardiovascular:     Rate and Rhythm: Normal rate and regular rhythm.     Comments: Right groin without hematoma Pulmonary:     Effort: Pulmonary effort is normal.     Breath sounds: Normal breath sounds.  Abdominal:     General: Abdomen is flat.     Palpations: Abdomen is soft.  Musculoskeletal:        General: Normal range of motion.     Cervical back: Normal range of motion.  Skin:    General: Skin is warm and dry.  Neurological:     General: No focal deficit present.     Mental Status: He is alert and oriented to person, place, and time.  Psychiatric:        Mood and Affect: Mood normal.        Behavior: Behavior normal.    Labs & Radiologic Studies    CBC Recent Labs    10/05/20 0248 10/06/20 0352  WBC 6.6 8.3  HGB 14.0 14.0  HCT 39.2 39.0  MCV 90.3 91.3  PLT 205 22867 Basic Metabolic Panel Recent Labs    10/05/20 0248 10/06/20 0352  NA 138 139  K 3.7 4.1  CL 107 105  CO2 24 25  GLUCOSE 167* 182*  BUN 10 8  CREATININE 1.00 0.95  CALCIUM 8.5* 9.0   Fasting Lipid Panel Recent Labs    10/06/20 0352  CHOL 118  HDL 31*  LDLCALC 46  TRIG 205*  CHOLHDL 3.8   _____________  CARDIAC CATHETERIZATION  Result Date: 10/05/2020  Ost LM lesion is 30% stenosed.  Mid LM to Dist LM lesion is 30% stenosed.  Prox LAD lesion is 55% stenosed.  Ost Cx to Prox Cx lesion is 99% stenosed.  Prox Cx to Mid Cx lesion  is 100% stenosed.  Mid RCA lesion is 85% stenosed.  Prox RCA-1 lesion is 50% stenosed.  Prox RCA-2 lesion is 60% stenosed.  A stent was successfully placed.  Post intervention, there is a 0% residual stenosis.  Post intervention, there is a 0% residual stenosis.  Post intervention, there is a 0% residual stenosis.  Difficult but successful percutaneous coronary intervention of a diffusely calcified proximal to mid RCA with significant luminal calcification in the mid RCA with ultimate successful CSI coronary atherectomy, PTCA, and stenting with a 4.0 x 38 mm Resolute Onyx DES  stent postdilated to 4.1 mm with the entire proximal to mid stenoses being reduced to 0%.  At the end of the procedure, there was brisk flow distally was now a normal-appearing distal PDA compared to the diagnostic study from yesterday. Temporary transvenous pacemaker insertion paced prior to atherectomy with utilization during the procedure. RECOMMENDATION: DAPT for a minimum of a year.  Medical therapy for concomitant CAD with total occlusion of the AV groove circumflex, 30% ostial and distal left main stenoses, and 50 to 60% proximal LAD stenoses.  Optimal blood pressure control, lipid management, and weight loss.   CARDIAC CATHETERIZATION  Result Date: 10/04/2020  Ost Cx to Prox Cx lesion is 99% stenosed.  Prox Cx to Mid Cx lesion is 100% stenosed.  Ost LM lesion is 30% stenosed.  Mid LM to Dist LM lesion is 30% stenosed.  Mid RCA lesion is 85% stenosed.  Prox RCA-1 lesion is 50% stenosed.  Prox RCA-2 lesion is 60% stenosed.  Prox LAD lesion is 55% stenosed.  RPDA lesion is 80% stenosed.  The left ventricular systolic function is normal.  The left ventricular ejection fraction is 50-55% by visual estimate.  LV end diastolic pressure is low.  Multivessel CAD with smooth ostial narrowing of the left main and smooth distal left main stenosis prior to trifurcating into an LAD, ramus immediate vessel and high marginal  vessel. The LAD has very proximal 50 to 60% irregularity initiating just beyond the ostium. The ramus immediate vessel is normal. 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 Large calcified dominant right coronary artery with superior takeoff and diffuse 5060% proximal stenosis, 90% stenosis in a small RV branch followed by at least 80% assist with an apparent chunk of calcium in this stenosis.  There is mild luminal irregularity of the RCA and it appears that there is very diffuse distal PDA stenosis of approximately 80% in  a small caliber distal PDA. Low normal global LV function with EF estimated 50 to 55%.  There is a small focal area of mid anterolateral hypocontractility most likely contributed by the AV groove circumflex occlusion.  LVEDP 7 mm Hg. RECOMMENDATION: Angiograms will be reviewed with colleagues.  The RCA is diffusely diseased and appears to have significant calcification with very focal 85% calcified mid stenosis.  We will hydrate the patient following his catheterization which required increased contrast load secondary to difficulty in engaging his coronary arteries.  Tentative plan will be for atherectomy/PCI to the RCA tomorrow.   ECHOCARDIOGRAM COMPLETE  Result Date: 10/04/2020    ECHOCARDIOGRAM REPORT   Patient Name:   Jerome Shepherd Date of Exam: 10/04/2020 Medical Rec #:  [id number]      Height:       75.0 in Accession #:    [id number]     Weight:       312.0 lb Date of Birth:  03-01-1966      BSA:          2.651 m Patient Age:    66 years       BP:           138/58 mmHg Patient Gender: M              HR:           62 bpm. Exam Location:  Inpatient Procedure: 2D Echo Indications:    CAD of native vessel  History:  Patient has no prior history of Echocardiogram examinations.                 CAD, Signs/Symptoms:Chest Pain; Risk Factors:Diabetes and                 Hypertension.  Sonographer:    Johny Chess  Referring Phys: [place] Comments: Image acquisition challenging due to patient body habitus. IMPRESSIONS  1. Left ventricular ejection fraction, by estimation, is 50 to 55%. The left ventricle has low normal function. The left ventricle has no regional wall motion abnormalities. There is mild concentric left ventricular hypertrophy. Left ventricular diastolic parameters are consistent with Grade II diastolic dysfunction (pseudonormalization).  2. Right ventricular systolic function is normal. The right ventricular size is normal. Tricuspid regurgitation signal is inadequate for assessing PA pressure.  3. The mitral valve is normal in structure. Trivial mitral valve regurgitation.  4. The aortic valve is tricuspid. Aortic valve regurgitation is trivial. No aortic stenosis is present. Comparison(s): No prior Echocardiogram. FINDINGS  Left Ventricle: Left ventricular ejection fraction, by estimation, is 50 to 55%. The left ventricle has low normal function. The left ventricle has no regional wall motion abnormalities. The left ventricular internal cavity size was normal in size. There is mild concentric left ventricular hypertrophy. Left ventricular diastolic parameters are consistent with Grade II diastolic dysfunction (pseudonormalization). Right Ventricle: The right ventricular size is normal. Right vetricular wall thickness was not well visualized. Right ventricular systolic function is normal. Tricuspid regurgitation signal is inadequate for assessing PA pressure. Left Atrium: Left atrial size was normal in size. Right Atrium: Right atrial size was normal in size. Pericardium: There is no evidence of pericardial effusion. Mitral Valve: The mitral valve is normal in structure. Trivial mitral valve regurgitation. Tricuspid Valve: The tricuspid valve is normal in structure. Tricuspid valve regurgitation is trivial. Aortic Valve: The aortic valve is tricuspid. Aortic valve regurgitation is  trivial. No aortic stenosis is present. Pulmonic Valve: The pulmonic valve was normal in structure. Pulmonic valve regurgitation is not visualized. Aorta: The aortic root and ascending aorta are structurally normal, with no evidence of dilitation. IAS/Shunts: No atrial level shunt detected by color flow Doppler.  LEFT VENTRICLE PLAX 2D LVIDd:         5.00 cm      Diastology LVIDs:         3.50 cm      LV e' medial:    6.74 cm/s LV PW:         1.00 cm      LV E/e' medial:  10.9 LV IVS:        1.10 cm      LV e' lateral:   12.10 cm/s LVOT diam:     2.10 cm      LV E/e' lateral: 6.1 LV SV:         72 LV SV Index:   27 LVOT Area:     3.46 cm  LV Volumes (MOD) LV vol d, MOD A4C: 135.0 ml LV vol s, MOD A4C: 61.6 ml LV SV MOD A4C:     135.0 ml RIGHT VENTRICLE             IVC RV S prime:     12.30 cm/s  IVC diam: 1.60 cm TAPSE (M-mode): 2.0 cm LEFT ATRIUM             Index       RIGHT ATRIUM  Index LA diam:        4.30 cm 1.62 cm/m  RA Area:     14.90 cm LA Vol (A2C):   55.7 ml 21.01 ml/m RA Volume:   35.30 ml  13.32 ml/m LA Vol (A4C):   69.8 ml 26.33 ml/m LA Biplane Vol: 64.6 ml 24.37 ml/m  AORTIC VALVE LVOT Vmax:   83.60 cm/s LVOT Vmean:  57.800 cm/s LVOT VTI:    0.209 m  AORTA Ao Root diam: 3.50 cm Ao Asc diam:  3.30 cm MITRAL VALVE MV Area (PHT): 3.77 cm    SHUNTS MV Decel Time: 201 msec    Systemic VTI:  0.21 m MV E velocity: 73.70 cm/s  Systemic Diam: 2.10 cm MV A velocity: 64.70 cm/s MV E/A ratio:  1.14 Gwyndolyn Kaufman MD Electronically signed by Gwyndolyn Kaufman MD Signature Date/Time: 10/04/2020/5:16:14 PM    Final    Disposition   Pt is being discharged home today in good condition.  Follow-up Plans & Appointments     Follow-up Information    Satira Sark, MD. Go on 10/26/2020.   Specialty: Cardiology Why: @2 :20pm for cath follow up  Contact information: [place] [phone]              Discharge Instructions    Amb Referral to Cardiac  Rehabilitation   Complete by: As directed    Diagnosis:  Coronary Stents PTCA     After initial evaluation and assessments completed: Virtual Based Care may be provided alone or in conjunction with Phase 2 Cardiac Rehab based on patient barriers.: Yes   Diet - low sodium heart healthy   Complete by: As directed    Discharge instructions   Complete by: As directed    No driving for 48 hours. No lifting over 5 lbs for 1 week. No sexual activity for 1 week. You may return to work on 10/09/20. Keep procedure site clean & dry. If you notice increased pain, swelling, bleeding or pus, call/return!  You may shower, but no soaking baths/hot tubs/pools for 1 week.   Increase activity slowly   Complete by: As directed       Discharge Medications   Allergies as of 10/06/2020      Reactions   Codeine Nausea Only   Diamox [acetazolamide] Nausea Only   dizziness   Sulfa Antibiotics Nausea Only      Medication List    STOP taking these medications   amLODipine 10 MG tablet Commonly known as: NORVASC   glimepiride 4 MG tablet Commonly known as: AMARYL   simvastatin 40 MG tablet Commonly known as: ZOCOR     TAKE these medications   aspirin EC 81 MG tablet Take 1 tablet (81 mg total) by mouth daily.   atorvastatin 40 MG tablet Commonly known as: LIPITOR Take 1 tablet (40 mg total) by mouth daily.   carvedilol 3.125 MG tablet Commonly known as: COREG Take 1 tablet (3.125 mg total) by mouth 2 (two) times daily with a meal.   cholecalciferol 25 MCG (1000 UNIT) tablet Commonly known as: VITAMIN D3 Take 1,000 Units by mouth daily.   dapagliflozin propanediol 10 MG Tabs tablet Commonly known as: Farxiga Take 1 tablet (10 mg total) by mouth daily before breakfast.   Dulaglutide 1.5 MG/0.5ML Sopn Inject 1.5 mg into the skin every Sunday.   esomeprazole 40 MG capsule Commonly known as: NEXIUM TAKE ONE CAPSULE BY MOUTH TWICE DAILY BEFORE A MEAL What changed: See  the new  instructions.   famotidine 20 MG tablet Commonly known as: PEPCID Take 20 mg by mouth daily as needed for heartburn or indigestion.   fluticasone 50 MCG/ACT nasal spray Commonly known as: FLONASE Place 1 spray into both nostrils daily as needed for allergies or rhinitis.   isosorbide mononitrate 60 MG 24 hr tablet Commonly known as: IMDUR TAKE ONE TABLET BY MOUTH EVERY DAY PATIENT NEEDS OFFICE VISIT What changed: See the new instructions.   loratadine-pseudoephedrine 10-240 MG 24 hr tablet Commonly known as: CLARITIN-D 24-hour Take 1 tablet by mouth daily as needed for allergies.   losartan 50 MG tablet Commonly known as: COZAAR TAKE 1 TABLET DAILY (NEED OFFICE VISIT) What changed: See the new instructions.   Magnesium 500 MG Tabs Take 500 mg by mouth daily as needed (for leg cramps).   metFORMIN 500 MG tablet Commonly known as: GLUCOPHAGE Take 500 mg by mouth 2 (two) times daily.   nitroGLYCERIN 0.4 MG SL tablet Commonly known as: NITROSTAT Place 1 tablet (0.4 mg total) under the tongue every 5 (five) minutes as needed for chest pain.   ticagrelor 90 MG Tabs tablet Commonly known as: BRILINTA Take 1 tablet (90 mg total) by mouth 2 (two) times daily.   vitamin C 1000 MG tablet Take 1,000 mg by mouth in the morning and at bedtime.          Outstanding Labs/Studies   Lipid panel and LFTS in 6 weeks  BMP at follow up   Duration of Discharge Encounter   Greater than 30 minutes including physician time.  SignedLeanor Kail, PA 10/06/2020, 9:38 AM   I have personally seen and examined this patient. I agree with the assessment and plan as outlined above.  He is doing well today post PCI. No chest pain. Will continue DAPT with ASA and Brilinta.  Discharge home today.   Lauree Chandler 10/06/2020 10:55 AM

## 2020-10-06 NOTE — TOC Benefit Eligibility Note (Signed)
Patient Teacher, English as a foreign language completed.    The patient is currently admitted and upon discharge could be taking Brilinta 90 mg.  The current 30 day co-pay is, $35.00.   The patient is currently admitted and upon discharge could be taking Jardiance 10 mg.  The current 30 day co-pay is, $15.00.   The patient is currently admitted and upon discharge could be taking Farxiga 10 mg.  The current 30 day co-pay is, $15.00.   The patient is insured through [place] [place] Patient Advocate [employer]ist [employer] Direct Number: [phone]  Fax: [phone]

## 2020-10-06 NOTE — Plan of Care (Signed)
Problem: Education: Goal: Understanding of CV disease, CV risk reduction, and recovery process will improve 10/06/2020 1017 by Wilder Glade, RN Outcome: Adequate for Discharge 10/06/2020 1017 by Wilder Glade, RN Outcome: Adequate for Discharge Goal: Individualized Educational Video(s) 10/06/2020 1017 by Wilder Glade, RN Outcome: Adequate for Discharge 10/06/2020 1017 by Wilder Glade, RN Outcome: Adequate for Discharge   Problem: Activity: Goal: Ability to return to baseline activity level will improve 10/06/2020 1017 by Wilder Glade, RN Outcome: Adequate for Discharge 10/06/2020 1017 by Wilder Glade, RN Outcome: Adequate for Discharge   Problem: Cardiovascular: Goal: Ability to achieve and maintain adequate cardiovascular perfusion will improve 10/06/2020 1017 by Wilder Glade, RN Outcome: Adequate for Discharge 10/06/2020 1017 by Wilder Glade, RN Outcome: Adequate for Discharge Goal: Vascular access site(s) Level 0-1 will be maintained 10/06/2020 1017 by Wilder Glade, RN Outcome: Adequate for Discharge 10/06/2020 1017 by Wilder Glade, RN Outcome: Adequate for Discharge   Problem: Health Behavior/Discharge Planning: Goal: Ability to safely manage health-related needs after discharge will improve 10/06/2020 1017 by Wilder Glade, RN Outcome: Adequate for Discharge 10/06/2020 1017 by Wilder Glade, RN Outcome: Adequate for Discharge   Problem: Education: Goal: Knowledge of General Education information will improve Description: Including pain rating scale, medication(s)/side effects and non-pharmacologic comfort measures 10/06/2020 1017 by Wilder Glade, RN Outcome: Adequate for Discharge 10/06/2020 1017 by Wilder Glade, RN Outcome: Adequate for Discharge   Problem: Health Behavior/Discharge Planning: Goal: Ability to manage health-related needs will improve 10/06/2020 1017 by Wilder Glade,  RN Outcome: Adequate for Discharge 10/06/2020 1017 by Wilder Glade, RN Outcome: Adequate for Discharge   Problem: Clinical Measurements: Goal: Ability to maintain clinical measurements within normal limits will improve 10/06/2020 1017 by Wilder Glade, RN Outcome: Adequate for Discharge 10/06/2020 1017 by Wilder Glade, RN Outcome: Adequate for Discharge Goal: Will remain free from infection 10/06/2020 1017 by Wilder Glade, RN Outcome: Adequate for Discharge 10/06/2020 1017 by Wilder Glade, RN Outcome: Adequate for Discharge Goal: Diagnostic test results will improve 10/06/2020 1017 by Wilder Glade, RN Outcome: Adequate for Discharge 10/06/2020 1017 by Wilder Glade, RN Outcome: Adequate for Discharge Goal: Respiratory complications will improve 10/06/2020 1017 by Wilder Glade, RN Outcome: Adequate for Discharge 10/06/2020 1017 by Wilder Glade, RN Outcome: Adequate for Discharge Goal: Cardiovascular complication will be avoided 10/06/2020 1017 by Wilder Glade, RN Outcome: Adequate for Discharge 10/06/2020 1017 by Wilder Glade, RN Outcome: Adequate for Discharge   Problem: Activity: Goal: Risk for activity intolerance will decrease 10/06/2020 1017 by Wilder Glade, RN Outcome: Adequate for Discharge 10/06/2020 1017 by Wilder Glade, RN Outcome: Adequate for Discharge   Problem: Nutrition: Goal: Adequate nutrition will be maintained 10/06/2020 1017 by Wilder Glade, RN Outcome: Adequate for Discharge 10/06/2020 1017 by Wilder Glade, RN Outcome: Adequate for Discharge   Problem: Coping: Goal: Level of anxiety will decrease 10/06/2020 1017 by Wilder Glade, RN Outcome: Adequate for Discharge 10/06/2020 1017 by Wilder Glade, RN Outcome: Adequate for Discharge   Problem: Elimination: Goal: Will not experience complications related to bowel motility 10/06/2020 1017 by Wilder Glade,  RN Outcome: Adequate for Discharge 10/06/2020 1017 by Wilder Glade, RN Outcome: Adequate for Discharge Goal: Will not experience complications related to urinary retention 10/06/2020 1017 by Wilder Glade, RN Outcome: Adequate for Discharge 10/06/2020 1017 by Wilder Glade, RN Outcome: Adequate for  Discharge   Problem: Pain Managment: Goal: General experience of comfort will improve 10/06/2020 1017 by Wilder Glade, RN Outcome: Adequate for Discharge 10/06/2020 1017 by Wilder Glade, RN Outcome: Adequate for Discharge   Problem: Safety: Goal: Ability to remain free from injury will improve 10/06/2020 1017 by Wilder Glade, RN Outcome: Adequate for Discharge 10/06/2020 1017 by Wilder Glade, RN Outcome: Adequate for Discharge   Problem: Skin Integrity: Goal: Risk for impaired skin integrity will decrease 10/06/2020 1017 by Wilder Glade, RN Outcome: Adequate for Discharge 10/06/2020 1017 by Wilder Glade, RN Outcome: Adequate for Discharge

## 2020-10-07 ENCOUNTER — Telehealth: Payer: Self-pay | Admitting: Cardiology

## 2020-10-07 NOTE — Telephone Encounter (Signed)
Patient called stating he was discharged yesterday after admission for chest pain and cath showed multivessel CAD and had PCI/atherectomy of prox RCA and started on Brilinta and ASA.  Now calling stating that he is having problems with SOB since going home from the hospital.  He just noticed it this evening before going out to dinner.  He did not have any SOB prior to am Brilinta dose.  He was outside this am and thinks part of it was related to pollen.  He had some SOB in the hospital and felt that it could be related to his Brilinta.  He says that if he takes a deep breath it hurts in his chest some.  He denies any anginal chest pain like he had in the hospital. He was able to walk his long driveway and felt fine.  He went [place] hunting this am with no problems.  The SOB came on when he was in the shower this evening after shaving, bathing and drying off.  He says that the SOB has completely resolved now.  I am not convinced that his SOB is related to Brilinta.  He may have over done it with activity today.  Encouraged him to wait on exercise until he goes to Cardiac rehab.  If SOB returns he was instructed to call us back.

## 2020-10-08 ENCOUNTER — Emergency Department (HOSPITAL_COMMUNITY): Payer: BC Managed Care – PPO

## 2020-10-08 ENCOUNTER — Observation Stay (HOSPITAL_COMMUNITY)
Admission: EM | Admit: 2020-10-08 | Discharge: 2020-10-09 | Disposition: A | Discharge status: Home / Self Care | Payer: BC Managed Care – PPO | Attending: Internal Medicine | Admitting: Internal Medicine

## 2020-10-08 ENCOUNTER — Other Ambulatory Visit: Payer: Self-pay

## 2020-10-08 ENCOUNTER — Telehealth: Payer: Self-pay | Admitting: Physician Assistant

## 2020-10-08 ENCOUNTER — Encounter (HOSPITAL_COMMUNITY): Payer: Self-pay

## 2020-10-08 DIAGNOSIS — Z7982 Long term (current) use of aspirin: Secondary | ICD-10-CM | POA: Diagnosis not present

## 2020-10-08 DIAGNOSIS — R7989 Other specified abnormal findings of blood chemistry: Secondary | ICD-10-CM | POA: Diagnosis not present

## 2020-10-08 DIAGNOSIS — Z87891 Personal history of nicotine dependence: Secondary | ICD-10-CM | POA: Insufficient documentation

## 2020-10-08 DIAGNOSIS — R002 Palpitations: Secondary | ICD-10-CM | POA: Insufficient documentation

## 2020-10-08 DIAGNOSIS — Z79899 Other long term (current) drug therapy: Secondary | ICD-10-CM | POA: Insufficient documentation

## 2020-10-08 DIAGNOSIS — R778 Other specified abnormalities of plasma proteins: Secondary | ICD-10-CM | POA: Diagnosis not present

## 2020-10-08 DIAGNOSIS — Z20822 Contact with and (suspected) exposure to covid-19: Secondary | ICD-10-CM | POA: Insufficient documentation

## 2020-10-08 DIAGNOSIS — I10 Essential (primary) hypertension: Secondary | ICD-10-CM

## 2020-10-08 DIAGNOSIS — I2510 Atherosclerotic heart disease of native coronary artery without angina pectoris: Secondary | ICD-10-CM | POA: Diagnosis not present

## 2020-10-08 DIAGNOSIS — R0602 Shortness of breath: Secondary | ICD-10-CM | POA: Diagnosis not present

## 2020-10-08 DIAGNOSIS — Z7984 Long term (current) use of oral hypoglycemic drugs: Secondary | ICD-10-CM | POA: Insufficient documentation

## 2020-10-08 DIAGNOSIS — E876 Hypokalemia: Secondary | ICD-10-CM | POA: Insufficient documentation

## 2020-10-08 DIAGNOSIS — E119 Type 2 diabetes mellitus without complications: Secondary | ICD-10-CM | POA: Insufficient documentation

## 2020-10-08 DIAGNOSIS — E785 Hyperlipidemia, unspecified: Secondary | ICD-10-CM

## 2020-10-08 LAB — CBC WITH DIFFERENTIAL/PLATELET
Abs Immature Granulocytes: 0.02 10*3/uL (ref 0.00–0.07)
Basophils Absolute: 0.1 10*3/uL (ref 0.0–0.1)
Basophils Relative: 1 %
Eosinophils Absolute: 0.2 10*3/uL (ref 0.0–0.5)
Eosinophils Relative: 2 %
HCT: 43.1 % (ref 39.0–52.0)
Hemoglobin: 15.5 g/dL (ref 13.0–17.0)
Immature Granulocytes: 0 %
Lymphocytes Relative: 27 %
Lymphs Abs: 2.5 10*3/uL (ref 0.7–4.0)
MCH: 32.2 pg (ref 26.0–34.0)
MCHC: 36 g/dL (ref 30.0–36.0)
MCV: 89.4 fL (ref 80.0–100.0)
Monocytes Absolute: 0.5 10*3/uL (ref 0.1–1.0)
Monocytes Relative: 5 %
Neutro Abs: 6 10*3/uL (ref 1.7–7.7)
Neutrophils Relative %: 65 %
Platelets: 293 10*3/uL (ref 150–400)
RBC: 4.82 MIL/uL (ref 4.22–5.81)
RDW: 11.8 % (ref 11.5–15.5)
WBC: 9.3 10*3/uL (ref 4.0–10.5)
nRBC: 0 % (ref 0.0–0.2)

## 2020-10-08 LAB — TROPONIN I (HIGH SENSITIVITY)
Troponin I (High Sensitivity): 666 ng/L (ref <18)
Troponin I (High Sensitivity): 650 ng/L (ref <18)

## 2020-10-08 LAB — BASIC METABOLIC PANEL
Anion gap: 11 (ref 5–15)
BUN: 18 mg/dL (ref 6–20)
CO2: 22 mmol/L (ref 22–32)
Calcium: 9.4 mg/dL (ref 8.9–10.3)
Chloride: 103 mmol/L (ref 98–111)
Creatinine, Ser: 1.15 mg/dL (ref 0.61–1.24)
GFR, Estimated: >60 mL/min (ref >60)
Glucose, Bld: 186 mg/dL — ABNORMAL HIGH (ref 70–99)
Potassium: 3.8 mmol/L (ref 3.5–5.1)
Sodium: 136 mmol/L (ref 135–145)

## 2020-10-08 LAB — HEPATIC FUNCTION PANEL
ALT: 25 U/L (ref 0–44)
AST: 38 U/L (ref 15–41)
Albumin: 4.1 g/dL (ref 3.5–5.0)
Alkaline Phosphatase: 64 U/L (ref 38–126)
Bilirubin, Direct: 0.2 mg/dL (ref 0.0–0.2)
Indirect Bilirubin: 0.7 mg/dL (ref 0.3–0.9)
Total Bilirubin: 0.9 mg/dL (ref 0.3–1.2)
Total Protein: 7.2 g/dL (ref 6.5–8.1)

## 2020-10-08 LAB — BRAIN NATRIURETIC PEPTIDE: B Natriuretic Peptide: 24.1 pg/mL (ref 0.0–100.0)

## 2020-10-08 LAB — SARS CORONAVIRUS 2 (TAT 6-24 HRS): SARS Coronavirus 2: NEGATIVE

## 2020-10-08 LAB — GLUCOSE, CAPILLARY: Glucose-Capillary: 120 mg/dL — ABNORMAL HIGH (ref 70–99)

## 2020-10-08 MED ORDER — IOHEXOL 350 MG/ML SOLN
65.0000 mL | Freq: Once | INTRAVENOUS | Status: AC | PRN
  Administered 2020-10-08: 65 mL via INTRAVENOUS

## 2020-10-08 MED ORDER — LACTATED RINGERS IV SOLN: INTRAVENOUS | Status: DC

## 2020-10-08 MED ORDER — ASPIRIN EC 81 MG PO TBEC
81.0000 mg | DELAYED_RELEASE_TABLET | Freq: Every day | ORAL | Status: DC
  Administered 2020-10-09: 81 mg via ORAL
  Filled 2020-10-08 (×2): qty 1

## 2020-10-08 MED ORDER — FLUTICASONE PROPIONATE 50 MCG/ACT NA SUSP
1.0000 | Freq: Every day | NASAL | Status: DC | PRN
  Filled 2020-10-08: qty 16

## 2020-10-08 MED ORDER — CLOPIDOGREL BISULFATE 75 MG PO TABS
300.0000 mg | ORAL_TABLET | Freq: Once | ORAL | Status: AC
  Administered 2020-10-09: 300 mg via ORAL
  Filled 2020-10-08: qty 4

## 2020-10-08 MED ORDER — VITAMIN D 25 MCG (1000 UNIT) PO TABS
1000.0000 [IU] | ORAL_TABLET | Freq: Every day | ORAL | Status: DC
  Administered 2020-10-09: 1000 [IU] via ORAL
  Filled 2020-10-08: qty 1

## 2020-10-08 MED ORDER — ACETAMINOPHEN 325 MG PO TABS: 650.0000 mg | ORAL_TABLET | ORAL | Status: DC | PRN

## 2020-10-08 MED ORDER — ONDANSETRON HCL 4 MG/2ML IJ SOLN: 4.0000 mg | Freq: Four times a day (QID) | INTRAMUSCULAR | Status: DC | PRN

## 2020-10-08 MED ORDER — INSULIN ASPART 100 UNIT/ML ~~LOC~~ SOLN
0.0000 [IU] | Freq: Three times a day (TID) | SUBCUTANEOUS | Status: DC
  Administered 2020-10-09: 2 [IU] via SUBCUTANEOUS
  Administered 2020-10-09: 3 [IU] via SUBCUTANEOUS

## 2020-10-08 MED ORDER — SODIUM CHLORIDE 0.9% FLUSH: 3.0000 mL | INTRAVENOUS | Status: DC | PRN

## 2020-10-08 MED ORDER — PANTOPRAZOLE SODIUM 40 MG PO TBEC
80.0000 mg | DELAYED_RELEASE_TABLET | Freq: Two times a day (BID) | ORAL | Status: DC
  Administered 2020-10-08 – 2020-10-09 (×2): 80 mg via ORAL
  Filled 2020-10-08 (×2): qty 2

## 2020-10-08 MED ORDER — CLOPIDOGREL BISULFATE 75 MG PO TABS: 75.0000 mg | ORAL_TABLET | Freq: Every day | ORAL | Status: DC

## 2020-10-08 MED ORDER — DAPAGLIFLOZIN PROPANEDIOL 10 MG PO TABS
10.0000 mg | ORAL_TABLET | Freq: Every day | ORAL | Status: DC
  Administered 2020-10-09: 10 mg via ORAL
  Filled 2020-10-08: qty 1

## 2020-10-08 MED ORDER — SODIUM CHLORIDE 0.9 % IV SOLN: 250.0000 mL | INTRAVENOUS | Status: DC | PRN

## 2020-10-08 MED ORDER — HEPARIN SODIUM (PORCINE) 5000 UNIT/ML IJ SOLN
5000.0000 [IU] | Freq: Three times a day (TID) | INTRAMUSCULAR | Status: DC
  Administered 2020-10-08 – 2020-10-09 (×2): 5000 [IU] via SUBCUTANEOUS
  Filled 2020-10-08 (×2): qty 1

## 2020-10-08 MED ORDER — ATORVASTATIN CALCIUM 40 MG PO TABS
40.0000 mg | ORAL_TABLET | Freq: Every day | ORAL | Status: DC
  Administered 2020-10-08 – 2020-10-09 (×2): 40 mg via ORAL
  Filled 2020-10-08 (×2): qty 1

## 2020-10-08 MED ORDER — ISOSORBIDE MONONITRATE ER 60 MG PO TB24
60.0000 mg | ORAL_TABLET | Freq: Every day | ORAL | Status: DC
  Administered 2020-10-08: 60 mg via ORAL
  Filled 2020-10-08 (×2): qty 1

## 2020-10-08 MED ORDER — TICAGRELOR 90 MG PO TABS
90.0000 mg | ORAL_TABLET | Freq: Two times a day (BID) | ORAL | Status: AC
  Administered 2020-10-08: 90 mg via ORAL
  Filled 2020-10-08: qty 1

## 2020-10-08 MED ORDER — LOSARTAN POTASSIUM 50 MG PO TABS
50.0000 mg | ORAL_TABLET | Freq: Every day | ORAL | Status: DC
  Administered 2020-10-08: 50 mg via ORAL
  Filled 2020-10-08 (×2): qty 1

## 2020-10-08 MED ORDER — DIPHENHYDRAMINE HCL 25 MG PO CAPS
25.0000 mg | ORAL_CAPSULE | Freq: Once | ORAL | Status: AC
  Administered 2020-10-08: 25 mg via ORAL
  Filled 2020-10-08: qty 1

## 2020-10-08 MED ORDER — SODIUM CHLORIDE 0.9% FLUSH
3.0000 mL | Freq: Two times a day (BID) | INTRAVENOUS | Status: DC
  Administered 2020-10-08 – 2020-10-09 (×2): 3 mL via INTRAVENOUS

## 2020-10-08 MED ORDER — CARVEDILOL 3.125 MG PO TABS
3.1250 mg | ORAL_TABLET | Freq: Two times a day (BID) | ORAL | Status: DC
  Administered 2020-10-08 – 2020-10-09 (×2): 3.125 mg via ORAL
  Filled 2020-10-08 (×2): qty 1

## 2020-10-08 NOTE — ED Notes (Signed)
ED Provider at bedside. 

## 2020-10-08 NOTE — Telephone Encounter (Signed)
Pt called stating he is having trouble breathing and wonders if it is due to brilinta. He describes PND last night and DOE today. When he walked to his front door he felt like he might pass out.  He is unable to walk to his truck on flat ground without significant shortness of breath. He is having chest soreness that sounds related to reperfusion pain vs pericarditis. He drank 12 oz bottle of mountain dew with no relief. He denies lower extremity swelling or weight gain, although he does not weigh at home. We discussed that this could be due to brilinta, but there are many things that could cause chest soreness and dyspnea - I advised several times to go to the ER to be evaluated. He will take brilinta and go to the ER. I advised he should have someone drive him since he is feeling so poorly. He expressed understanding of the plan and will go to the ER.    Tami Lin Ladonte Verstraete, PA-C 10/08/2020, 8:13 AM [phone]

## 2020-10-08 NOTE — ED Notes (Signed)
Notified MD of trop of 666

## 2020-10-08 NOTE — ED Provider Notes (Signed)
[facility] EMERGENCY DEPARTMENT Provider Note   CSN: [id number] Arrival date & time: 10/08/20  1209     History No chief complaint on file.   Jerome Shepherd is a 55 y.o. male.  55 year old male with recent history of coronary artery stent placement presents with shortness of breath x2 days.  Patient notes severe exertional dyspnea.  Symptoms better with rest.  Denies any pleuritic chest pain.  Has had some chest soreness.  No fever or cough.  No leg pain or swelling.  Denies any history of blood loss.  Is concerned that this might be related to his Brilinta.  Spoke to cardiology service was told to come in for further management.        Past Medical History:  Diagnosis Date  . 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 Arthritis   . CAD (coronary artery disease)    Nonobstructive by cardiac catheterization 2013  . Constipation   . Coronary vasospasm ([place])   . Essential hypertension   . GERD (gastroesophageal reflux disease)   . Hyperlipidemia   . Type 2 diabetes mellitus [facility]     Patient Active Problem List   Diagnosis Date Noted  . Accelerating angina ([place])   . Vertigo 09/23/2018  . Nausea without vomiting 02/06/2018  . NASH (nonalcoholic steatohepatitis) 04/24/2016  . Encounter for screening colonoscopy 04/24/2016  . Herpes zoster 03/17/2016  . ESBL (extended spectrum beta-lactamase) producing bacteria infection   . Acute pyelonephritis   . Chronic pain syndrome   . Uncontrolled type 2 diabetes mellitus with complication ([place])   . Acute renal failure ([place])   . AKI (acute kidney injury) ([place])   . Pyelonephritis 03/11/2016  . ARF (acute renal failure) ([place]) 03/11/2016  . 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 Esophageal dysphagia, with solids only, leading to chest pain 01/22/2013  . HTN (hypertension) 01/22/2013  . 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 Chest pain 01/01/2013  . CAD (coronary artery disease),  minimal, non obstructive 2013 01/01/2013  . GERD (gastroesophageal reflux disease) 01/01/2013  . Abnormal EKG, ?st elevation reported at OSH 08/18/2011  . Diabetes mellitus type 2, controlled ([place]) 08/18/2011  . Obesity 08/18/2011    Past Surgical History:  Procedure Laterality Date  . BIOPSY  02/19/2018   Procedure: BIOPSY;  Surgeon: Daneil Dolin, MD;  Location[facility];  Service: Endoscopy;;  esophagus  . CARDIAC CATHETERIZATION  2013   non obstructive CAD  ~20%  . CHOLECYSTECTOMY N/A 04/01/2014   Procedure: LAPAROSCOPIC CHOLECYSTECTOMY;  Surgeon: Jamesetta So, MD;  Location: [facility] ORS;  Service: General;  Laterality: N/A;  . COLONOSCOPY WITH PROPOFOL N/A 08/22/2016   Procedure: COLONOSCOPY WITH PROPOFOL;  Surgeon: Daneil Dolin, MD;  Location: [facility];  Service: Endoscopy;  Laterality: N/A;  730   . CORONARY ATHERECTOMY N/A 10/05/2020   Procedure: CORONARY ATHERECTOMY;  Surgeon: Troy Sine, MD;  Location: [place] CV LAB;  Service: Cardiovascular;  Laterality: N/A;  . ESOPHAGOGASTRODUODENOSCOPY N/A 01/10/2014   Dr. Gala Romney: antral erosions, path with chronic inactive gastritis. Empiric dilation with 56-F dilation  . ESOPHAGOGASTRODUODENOSCOPY (EGD) WITH PROPOFOL N/A 02/16/2016   Dr. Gala Romney: small hiatal hernia, normal esopagus s/p empiric dilation  . ESOPHAGOGASTRODUODENOSCOPY (EGD) WITH PROPOFOL N/A 02/19/2018   Procedure: ESOPHAGOGASTRODUODENOSCOPY (EGD) WITH PROPOFOL;  Surgeon: Daneil Dolin, MD;  [facility];  Service: Endoscopy;  Laterality: N/A;  10:15am  . KNEE ARTHROSCOPY  Right 2002  . LEFT HEART CATH N/A 08/18/2011   Procedure: LEFT HEART CATH;  Surgeon: Lorretta Harp, MD;  Location: [facility] CATH LAB;  Service: Cardiovascular;  Laterality: N/A;  . LEFT HEART CATH AND CORONARY ANGIOGRAPHY N/A 10/04/2020   Procedure: LEFT HEART CATH AND CORONARY ANGIOGRAPHY;  Surgeon: Troy Sine, MD;  Location: [place] CV LAB;  Service: Cardiovascular;   Laterality: N/A;  . LIVER BIOPSY N/A 04/01/2014   mild fibrosis  . MALONEY DILATION N/A 01/10/2014   Procedure: Venia Minks DILATION;  Surgeon: Daneil Dolin, MD;  [facility];  Service: Endoscopy;  Laterality: N/A;  Venia Minks DILATION N/A 02/16/2016   Procedure: Venia Minks DILATION;  Surgeon: Daneil Dolin, MD;  [facility];  Service: Endoscopy;  Laterality: N/A;  . Venia Minks DILATION N/A 02/19/2018   Procedure: Venia Minks DILATION;  Surgeon: Daneil Dolin, MD;  [facility];  Service: Endoscopy;  Laterality: N/A;  . POLYPECTOMY  08/22/2016   Procedure: POLYPECTOMY;  Surgeon: Daneil Dolin, MD;  [facility];  Service: Endoscopy;;  . Azzie Almas DILATION N/A 01/10/2014   Procedure: Azzie Almas DILATION;  Surgeon: Daneil Dolin, MD;  [facility];  Service: Endoscopy;  Laterality: N/A;  . TEMPORARY PACEMAKER N/A 10/05/2020   Procedure: TEMPORARY PACEMAKER;  Surgeon: Troy Sine, MD;  Location: [place] CV LAB;  Service: Cardiovascular;  Laterality: N/A;       Family History  Problem Relation Age of Onset  . Diabetes type II Sister   . Heart attack Cousin   . Colon cancer Neg Hx   . Stomach cancer Neg Hx   . Esophageal cancer Neg Hx     Social History   Tobacco Use  . Smoking status: Former Smoker    Packs/day: 1.50    Years: 15.00    Pack years: 22.50    Types: Cigarettes    Quit date: 08/18/1995    Years since quitting: 25.1  . Smokeless tobacco: Never Used  . Tobacco comment: Quit smoking x 20 years  Vaping Use  . Vaping Use: Never used  Substance Use Topics  . Alcohol use: No    Alcohol/week: 0.0 standard drinks  . Drug use: No    Home Medications Prior to Admission medications   Medication Sig Start Date End Date Taking? Authorizing Provider  Ascorbic Acid (VITAMIN C) 1000 MG tablet Take 1,000 mg by mouth in the morning and at bedtime.    [provider]  aspirin EC 81 MG tablet Take 1 tablet (81 mg total) by  mouth daily. 11/23/15   Herminio Commons, MD  atorvastatin (LIPITOR) 40 MG tablet Take 1 tablet (40 mg total) by mouth daily. 10/06/20   Troy Sine, MD  carvedilol (COREG) 3.125 MG tablet Take 1 tablet (3.125 mg total) by mouth 2 (two) times daily with a meal. 10/06/20   Troy Sine, MD  cholecalciferol (VITAMIN D3) 25 MCG (1000 UNIT) tablet Take 1,000 Units by mouth daily.    [provider]  dapagliflozin propanediol (FARXIGA) 10 MG TABS tablet Take 1 tablet (10 mg total) by mouth daily before breakfast. 10/06/20   Troy Sine, MD  Dulaglutide 1.5 MG/0.5ML SOPN Inject 1.5 mg into the skin every Sunday.    [provider]  esomeprazole (NEXIUM) 40 MG capsule TAKE ONE CAPSULE BY MOUTH TWICE DAILY BEFORE A MEAL Patient taking differently: Take 40 mg by mouth 2 (two) times daily before a  meal. 09/15/19   Carlis Stable, NP  famotidine (PEPCID) 20 MG tablet Take 20 mg by mouth daily as needed for heartburn or indigestion.    [provider]  fluticasone (FLONASE) 50 MCG/ACT nasal spray Place 1 spray into both nostrils daily as needed for allergies or rhinitis.    [provider]  isosorbide mononitrate (IMDUR) 60 MG 24 hr tablet TAKE ONE TABLET BY MOUTH EVERY DAY PATIENT NEEDS OFFICE VISIT Patient taking differently: Take 60 mg by mouth daily. 01/15/17   Herminio Commons, MD  loratadine-pseudoephedrine (CLARITIN-D 24-HOUR) 10-240 MG 24 hr tablet Take 1 tablet by mouth daily as needed for allergies.    [provider]  losartan (COZAAR) 50 MG tablet TAKE 1 TABLET DAILY (NEED OFFICE VISIT) Patient taking differently: Take 50 mg by mouth daily. 03/30/20   Verta Ellen., NP  Magnesium 500 MG TABS Take 500 mg by mouth daily as needed (for leg cramps).     [provider]  metFORMIN (GLUCOPHAGE) 500 MG tablet Take 500 mg by mouth 2 (two) times daily.     [provider]  nitroGLYCERIN (NITROSTAT) 0.4 MG SL tablet Place 1 tablet  (0.4 mg total) under the tongue every 5 (five) minutes as needed for chest pain. Patient not taking: Reported on 09/29/2020 07/18/15   Herminio Commons, MD  ticagrelor (BRILINTA) 90 MG TABS tablet Take 1 tablet (90 mg total) by mouth 2 (two) times daily. 10/06/20   Troy Sine, MD    Allergies    Codeine, Diamox [acetazolamide], and Sulfa antibiotics  Review of Systems   Review of Systems  All other systems reviewed and are negative.   Physical Exam Updated Vital Signs BP 118/85 (BP Location: Right Arm)   Pulse 90   Temp 98 F (36.7 C) (Oral)   Resp 16   SpO2 100%   Physical Exam Vitals and nursing note reviewed.  Constitutional:      General: He is not in acute distress.    Appearance: Normal appearance. He is well-developed. He is not toxic-appearing.  HENT:     Head: Normocephalic and atraumatic.  Eyes:     General: Lids are normal.     Conjunctiva/sclera: Conjunctivae normal.     Pupils: Pupils are equal, round, and reactive to light.  Neck:     Thyroid: No thyroid mass.     Trachea: No tracheal deviation.  Cardiovascular:     Rate and Rhythm: Normal rate and regular rhythm.     Heart sounds: Normal heart sounds. No murmur heard. No gallop.   Pulmonary:     Effort: Pulmonary effort is normal. No respiratory distress.     Breath sounds: Normal breath sounds. No stridor. No decreased breath sounds, wheezing, rhonchi or rales.  Abdominal:     General: Bowel sounds are normal. There is no distension.     Palpations: Abdomen is soft.     Tenderness: There is no abdominal tenderness. There is no rebound.  Musculoskeletal:        General: No tenderness. Normal range of motion.     Cervical back: Normal range of motion and neck supple.  Skin:    General: Skin is warm and dry.     Findings: No abrasion or rash.  Neurological:     Mental Status: He is alert and oriented to person, place, and time.     GCS: GCS eye subscore is 4. GCS verbal subscore is 5. GCS motor  subscore is 6.     Cranial Nerves: No cranial nerve deficit.     Sensory: No sensory deficit.  Psychiatric:        Speech: Speech normal.        Behavior: Behavior normal.     ED Results / Procedures / Treatments   Labs (all labs ordered are listed, but only abnormal results are displayed) Labs Reviewed  CBC WITH DIFFERENTIAL/PLATELET  BASIC METABOLIC PANEL  TROPONIN I (HIGH SENSITIVITY)    EKG EKG Interpretation  Date/Time:  Sunday October 08 2020 12:21:30 EDT Ventricular Rate:  86 PR Interval:  166 QRS Duration: 94 QT Interval:  382 QTC Calculation: 457 R Axis:   -35 Text Interpretation: Normal sinus rhythm Left axis deviation Anteroseptal infarct , age undetermined Abnormal ECG Confirmed by Lacretia Leigh ([id number]) on 10/08/2020 12:24:50 PM   Radiology No results found.  Procedures Procedures   Medications Ordered in ED Medications  lactated ringers infusion (has no administration in time range)    ED Course  I have reviewed the triage vital signs and the nursing notes.  Pertinent labs & imaging results that were available during my care of the patient were reviewed by me and considered in my medical decision making (see chart for details).    MDM Rules/Calculators/A&P                          CT of the chest negative for PE.  Troponin here is elevated.  Discussed with cardiology and they will admit Final Clinical Impression(s) / ED Diagnoses Final diagnoses:  None    Rx / DC Orders ED Discharge Orders    None       Lacretia Leigh, MD 10/08/20 1519

## 2020-10-08 NOTE — ED Triage Notes (Signed)
Patient complains of SOB with rest and exertion. Also complains of mild chest pain intermittently. Patient has had stents for CAD. Patient alert and oriented, denies CP on assessment. Denies cough, denies fever.

## 2020-10-08 NOTE — ED Notes (Signed)
Attempted report x1. 

## 2020-10-08 NOTE — H&P (Addendum)
Cardiology Admission History and Physical:   Patient ID: Jerome Shepherd MRN: [id number]; DOB: December 26, 1965   Admission date: 10/08/2020  PCP:  Glenda Chroman, MD   [place]  Cardiologist:  Rozann Lesches, MD  Advanced Practice Provider:  No care team member to display Electrophysiologist:  None  [phone]  Chief Complaint:  Shortness of breath  Patient Profile:   Jerome Shepherd is a 55 y.o. male with CAD, HTN, HLD, GERD, DM, morbid obesity, former remote tobacco abuse, patient reported cirrhosis (? NASH) who presented to [facility] with worsening SOB after recent heart cath.  History of Present Illness:   Jerome Shepherd had a prior stress test in 01/2020 which was intermediate risk, managed medically at that time. He was recently seen as an outpatient for worsening chest tightness and DOE. Outpatient cath was performed 10/04/20 multivessel CAD as outlined in cath note below. Angiogram reviewed with colleagues and PCI pursued. Patient staged difficult but successful percutaneous coronary intervention 10/05/20 of a diffusely calcified proximal to mid RCA with significant luminal calcification in the mid RCA with ultimate successful CSI coronary atherectomy, PTCA, and stenting with a 4.0 x 38 mm Resolute Onyx DES stent. He recalls expressing severe chest pain during the case and received pain medication. He states he honestly thought he was going to die. Post cath he had some SOB which was felt to be potential side effect of Brilinta. Amlodipine changed to carvedilol. Echo showed 50 to 55%, no wall motion abnormality and grade 2 diastolic dysfunction. LVEDP was 17 by cath and he did not appear volume overloaded. Also started on Farxiga. He went home 4/15 and felt well that day and the next. However, yesterday he noticed worsening DOE with even minimal exertion. He also sometimes notices waves of dyspnea that are accompanied by nausea and feeling lightheaded. He has had  some left sided twinges/pinches of chest pain but nothing like the pain he had had previously. No pleuritic chest pain, just an awareness of his chest when he takes a deep breath in. Symptoms are not worse with recumbency either (actually better). Due to persistent dyspnea he called answering service last night and this AM and was referred to ED. Here, CXR NAD. CBC wnl, hsTroponin 666->650. CT angio showed no acute findings, no PE, no pulm edema or PNA, 3v CAD, fatty infiltration, possible bilateral gynecomastia. VSS, not tachycardic or hypoxic.  Past Medical History:  Diagnosis Date  . 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 Arthritis   . CAD (coronary artery disease)    a. PCI 09/2020.  . Constipation   . Coronary vasospasm ([place])   . Essential hypertension   . GERD (gastroesophageal reflux disease)   . Hyperlipidemia   . Type 2 diabetes mellitus ([place])     Past Surgical History:  Procedure Laterality Date  . BIOPSY  02/19/2018   Procedure: BIOPSY;  Surgeon: Daneil Dolin, MD;  [facility];  Service: Endoscopy;;  esophagus  . CARDIAC CATHETERIZATION  2013   non obstructive CAD  ~20%  . CHOLECYSTECTOMY N/A 04/01/2014   Procedure: LAPAROSCOPIC CHOLECYSTECTOMY;  Surgeon: Jamesetta So, MD;  Location: [facility];  Service: General;  Laterality: N/A;  . COLONOSCOPY WITH PROPOFOL N/A 08/22/2016   Procedure: COLONOSCOPY WITH PROPOFOL;  Surgeon: Daneil Dolin, MD;  [facility];  Service: Endoscopy;  Laterality: N/A;  730   . CORONARY  ATHERECTOMY N/A 10/05/2020   Procedure: CORONARY ATHERECTOMY;  Surgeon: Troy Sine, MD;  Location: [place] CV LAB;  Service: Cardiovascular;  Laterality: N/A;  . ESOPHAGOGASTRODUODENOSCOPY N/A 01/10/2014   Dr. Gala Romney: antral erosions, path with chronic inactive gastritis. Empiric dilation with 56-F dilation  . ESOPHAGOGASTRODUODENOSCOPY (EGD) WITH PROPOFOL N/A 02/16/2016   Dr. Gala Romney: small  hiatal hernia, normal esopagus s/p empiric dilation  . ESOPHAGOGASTRODUODENOSCOPY (EGD) WITH PROPOFOL N/A 02/19/2018   Procedure: ESOPHAGOGASTRODUODENOSCOPY (EGD) WITH PROPOFOL;  Surgeon: Daneil Dolin, MD;  [facility];  Service: Endoscopy;  Laterality: N/A;  10:15am  . KNEE ARTHROSCOPY Right 2002  . LEFT HEART CATH N/A 08/18/2011   Procedure: LEFT HEART CATH;  Surgeon: Lorretta Harp, MD;  Location: [facility] CATH LAB;  Service: Cardiovascular;  Laterality: N/A;  . LEFT HEART CATH AND CORONARY ANGIOGRAPHY N/A 10/04/2020   Procedure: LEFT HEART CATH AND CORONARY ANGIOGRAPHY;  Surgeon: Troy Sine, MD;  Location: [place] CV LAB;  Service: Cardiovascular;  Laterality: N/A;  . LIVER BIOPSY N/A 04/01/2014   mild fibrosis  . MALONEY DILATION N/A 01/10/2014   Procedure: Venia Minks DILATION;  Surgeon: Daneil Dolin, MD;  [facility];  Service: Endoscopy;  Laterality: N/A;  Venia Minks DILATION N/A 02/16/2016   Procedure: Venia Minks DILATION;  Surgeon: Daneil Dolin, MD;  [facility];  Service: Endoscopy;  Laterality: N/A;  . Venia Minks DILATION N/A 02/19/2018   Procedure: Venia Minks DILATION;  Surgeon: Daneil Dolin, MD;  [facility];  Service: Endoscopy;  Laterality: N/A;  . POLYPECTOMY  08/22/2016   Procedure: POLYPECTOMY;  Surgeon: Daneil Dolin, MD;  [facility];  Service: Endoscopy;;  . Azzie Almas DILATION N/A 01/10/2014   Procedure: Azzie Almas DILATION;  Surgeon: Daneil Dolin, MD;  [facility];  Service: Endoscopy;  Laterality: N/A;  . TEMPORARY PACEMAKER N/A 10/05/2020   Procedure: TEMPORARY PACEMAKER;  Surgeon: Troy Sine, MD;  Location: [place] CV LAB;  Service: Cardiovascular;  Laterality: N/A;     Medications Prior to Admission: Prior to Admission medications   Medication Sig Start Date End Date Taking? Authorizing Provider  Ascorbic Acid (VITAMIN C) 1000 MG tablet Take 1,000 mg by mouth in the morning and at bedtime.   Yes  [provider]  aspirin EC 81 MG tablet Take 1 tablet (81 mg total) by mouth daily. 11/23/15  Yes Herminio Commons, MD  atorvastatin (LIPITOR) 40 MG tablet Take 1 tablet (40 mg total) by mouth daily. 10/06/20  Yes Troy Sine, MD  carvedilol (COREG) 3.125 MG tablet Take 1 tablet (3.125 mg total) by mouth 2 (two) times daily with a meal. 10/06/20  Yes Troy Sine, MD  cholecalciferol (VITAMIN D3) 25 MCG (1000 UNIT) tablet Take 1,000 Units by mouth daily.   Yes [provider]  dapagliflozin propanediol (FARXIGA) 10 MG TABS tablet Take 1 tablet (10 mg total) by mouth daily before breakfast. 10/06/20  Yes Troy Sine, MD  Dulaglutide 1.5 MG/0.5ML SOPN Inject 1.5 mg into the skin every Sunday.   Yes [provider]  esomeprazole (NEXIUM) 40 MG capsule TAKE ONE CAPSULE BY MOUTH TWICE DAILY BEFORE A MEAL Patient taking differently: Take 40 mg by mouth 2 (two) times daily before a meal. 09/15/19  Yes Carlis Stable, NP  famotidine (PEPCID) 20 MG tablet Take 20 mg by mouth daily as needed for heartburn or indigestion.   Yes [provider]  fluticasone (  FLONASE) 50 MCG/ACT nasal spray Place 1 spray into both nostrils daily as needed for allergies or rhinitis.   Yes [provider]  isosorbide mononitrate (IMDUR) 60 MG 24 hr tablet TAKE ONE TABLET BY MOUTH EVERY DAY PATIENT NEEDS OFFICE VISIT Patient taking differently: Take 60 mg by mouth daily. 01/15/17  Yes Herminio Commons, MD  loratadine-pseudoephedrine (CLARITIN-D 24-HOUR) 10-240 MG 24 hr tablet Take 1 tablet by mouth daily as needed for allergies.   Yes [provider]  losartan (COZAAR) 50 MG tablet TAKE 1 TABLET DAILY (NEED OFFICE VISIT) Patient taking differently: Take 50 mg by mouth daily. 03/30/20  Yes Verta Ellen., NP  Magnesium 500 MG TABS Take 500 mg by mouth daily as needed (for leg cramps).    Yes [provider]  metFORMIN (GLUCOPHAGE) 500 MG tablet Take 500 mg  by mouth 2 (two) times daily.    Yes [provider]  ticagrelor (BRILINTA) 90 MG TABS tablet Take 1 tablet (90 mg total) by mouth 2 (two) times daily. 10/06/20  Yes Troy Sine, MD  nitroGLYCERIN (NITROSTAT) 0.4 MG SL tablet Place 1 tablet (0.4 mg total) under the tongue every 5 (five) minutes as needed for chest pain. Patient not taking: No sig reported 07/18/15   Herminio Commons, MD     Allergies:    Allergies  Allergen Reactions  . Codeine Nausea Only  . Diamox [Acetazolamide] Nausea Only    dizziness  . Sulfa Antibiotics Nausea Only    Social History:   Social History   Socioeconomic History  . Marital status: Single    Spouse name: Not on file  . Number of children: Not on file  . Years of education: Not on file  . Highest education level: Not on file  Occupational History  . Occupation: truck Education administrator: [employer]  Tobacco Use  . Smoking status: Former Smoker    Packs/day: 1.50    Years: 15.00    Pack years: 22.50    Types: Cigarettes    Quit date: 08/18/1995    Years since quitting: 25.1  . Smokeless tobacco: Never Used  . Tobacco comment: Quit smoking x 20 years  Vaping Use  . Vaping Use: Never used  Substance and Sexual Activity  . Alcohol use: No    Alcohol/week: 0.0 standard drinks  . Drug use: No  . Sexual activity: Not on file  Other Topics Concern  . Not on file  Social History Narrative   Lives alone   Caffeine use: tea daily, no soda    Truck diver   Right handed    Divorced, no children, works as a Production designer, theatre/television/film.   Social Determinants of Health   Financial Resource Strain: Not on file  Food Insecurity: Not on file  Transportation Needs: Not on file  Physical Activity: Not on file  Stress: Not on file  Social Connections: Not on file  Intimate Partner Violence: Not on file    Family History:   The patient's family history includes CAD in an other family member; Diabetes type II in his sister; Heart  attack in his cousin. There is no history of Colon cancer, Stomach cancer, or Esophageal cancer.    ROS:  Please see the history of present illness.  All other ROS reviewed and negative.     Physical Exam/Data:   Vitals:   10/08/20 1430 10/08/20 1530 10/08/20 1615 10/08/20 1700  BP: 117/75 135/83 132/76 136/79  Pulse: 66 72 75 77  Resp: 12 16 17 12   Temp:      TempSrc:      SpO2: 97% 98% 97% 100%   No intake or output data in the 24 hours ending 10/08/20 1722 Last 3 Weights 10/06/2020 10/05/2020 10/04/2020  Weight (lbs) 300 lb 1.6 oz 303 lb 14.4 oz 312 lb  Weight (kg) 136.124 kg 137.848 kg 141.522 kg     There is no height or weight on file to calculate BMI.  General: Well developed, well nourished WM in no acute distress. Head: Normocephalic, atraumatic, sclera non-icteric, no xanthomas, nares are without discharge. Neck: Negative for carotid bruits. JVP not elevated. Lungs: Clear bilaterally to auscultation without wheezes, rales, or rhonchi. Breathing is unlabored. Heart: RRR S1 S2 without murmurs, rubs, or gallops.  Abdomen: Soft, non-tender, non-distended with normoactive bowel sounds. No rebound/guarding. Extremities: No clubbing or cyanosis. No edema. Distal pedal pulses are 2+ and equal bilaterally. Right radial cath site without hematoma or ecchymosis; good pulse. Right groin cath site without hematoma, ecchymosis, or bruit. Neuro: Alert and oriented X 3. Moves all extremities spontaneously. Psych:  Responds to questions appropriately with a normal affect.   EKG:  The ECG that was done today was personally reviewed and demonstrates NSR 86bpm, left axis deviation, old anteroseptal infarct, nonspecific TW changes without acute change from prior  Relevant CV Studies: Staged Cath 10/05/20    Ost LM lesion is 30% stenosed.  Mid LM to Dist LM lesion is 30% stenosed.  Prox LAD lesion is 55% stenosed.  Ost Cx to Prox Cx lesion is 99% stenosed.  Prox Cx to Mid Cx lesion  is 100% stenosed.  Mid RCA lesion is 85% stenosed.  Prox RCA-1 lesion is 50% stenosed.  Prox RCA-2 lesion is 60% stenosed.  A stent was successfully placed.  Post intervention, there is a 0% residual stenosis.  Post intervention, there is a 0% residual stenosis.  Post intervention, there is a 0% residual stenosis.   Difficult but successful percutaneous coronary intervention of a diffusely calcified proximal to mid RCA with significant luminal calcification in the mid RCA with ultimate successful CSI coronary atherectomy, PTCA, and stenting with a 4.0 x 38 mm Resolute Onyx DES stent postdilated to 4.1 mm with the entire proximal to mid stenoses being reduced to 0%.  At the end of the procedure, there was brisk flow distally was now a normal-appearing distal PDA compared to the diagnostic study from yesterday.  Temporary transvenous pacemaker insertion paced prior to atherectomy with utilization during the procedure.  RECOMMENDATION: DAPT for a minimum of a year.  Medical therapy for concomitant CAD with total occlusion of the AV groove circumflex, 30% ostial and distal left main stenoses, and 50 to 60% proximal LAD stenoses.  Optimal blood pressure control, lipid management, and weight loss.  2d echo 10/04/20 1. Left ventricular ejection fraction, by estimation, is 50 to 55%. The  left ventricle has low normal function. The left ventricle has no regional  wall motion abnormalities. There is mild concentric left ventricular  hypertrophy. Left ventricular  diastolic parameters are consistent with Grade II diastolic dysfunction  (pseudonormalization).  2. Right ventricular systolic function is normal. The right ventricular  size is normal. Tricuspid regurgitation signal is inadequate for assessing  PA pressure.  3. The mitral valve is normal in structure. Trivial mitral valve  regurgitation.  4. The aortic valve is tricuspid. Aortic valve regurgitation is trivial.  No aortic  stenosis is present.  Comparison(s): No prior Echocardiogram.   Laboratory Data:  High Sensitivity Troponin:   Recent Labs  Lab 10/08/20 1238 10/08/20 1513  TROPONINIHS 666* 650*      Chemistry Recent Labs  Lab 10/06/20 0352 10/08/20 1238  NA 139 136  K 4.1 3.8  CL 105 103  CO2 25 22  GLUCOSE 182* 186*  BUN 8 18  CREATININE 0.95 1.15  CALCIUM 9.0 9.4  GFRNONAA >60 >60  ANIONGAP 9 11    No results for input(s): PROT, ALBUMIN, AST, ALT, ALKPHOS, BILITOT in the last 168 hours. Hematology Recent Labs  Lab 10/06/20 0352 10/08/20 1238  WBC 8.3 9.3  RBC 4.27 4.82  HGB 14.0 15.5  HCT 39.0 43.1  MCV 91.3 89.4  MCH 32.8 32.2  MCHC 35.9 36.0  RDW 11.9 11.8  PLT 225 293   BNPNo results for input(s): BNP, PROBNP in the last 168 hours.  DDimer No results for input(s): DDIMER in the last 168 hours.   Radiology/Studies:  CT Angio Chest PE W/Cm &/Or Wo Cm  Result Date: 10/08/2020 CLINICAL DATA:  Shortness of breath, chest pain.  PE suspected. EXAM: CT ANGIOGRAPHY CHEST WITH CONTRAST TECHNIQUE: Multidetector CT imaging of the chest was performed using the standard protocol during bolus administration of intravenous contrast. Multiplanar CT image reconstructions and MIPs were obtained to evaluate the vascular anatomy. CONTRAST:  54m OMNIPAQUE IOHEXOL 350 MG/ML SOLN COMPARISON:  None. FINDINGS: Cardiovascular: There is no pulmonary embolism identified within the main, lobar or segmental pulmonary arteries bilaterally. No thoracic aortic aneurysm. Heart size appears to be within normal limits. No pericardial effusion. Three-vessel coronary artery calcifications, particularly dense within the RIGHT main coronary artery. Mediastinum/Nodes: No mass or enlarged lymph nodes are seen within the mediastinum or perihilar regions. Esophagus appears normal. Trachea and central bronchi are unremarkable. Lungs/Pleura: Lungs are clear.  No pleural effusion or pneumothorax. Upper Abdomen: Liver is  diffusely low in density suggesting fatty infiltration, incompletely imaged. Limitedw images of the upper abdomen are otherwise unremarkable. Musculoskeletal: No acute or suspicious osseous abnormality. Probable bilateral gynecomastia. Review of the MIP images confirms the above findings. IMPRESSION: 1. No acute findings. No pulmonary embolism. No pneumonia or pulmonary edema. 2. Three-vessel coronary artery calcifications, particularly dense within the RIGHT main coronary artery. 3. Fatty infiltration of the liver. 4. Probable bilateral gynecomastia. Electronically Signed   By: SFranki CabotM.D.   On: 10/08/2020 15:08   DG Chest Port 1 View  Result Date: 10/08/2020 CLINICAL DATA:  Shortness of breath and chest pain. EXAM: PORTABLE CHEST 1 VIEW COMPARISON:  January 16, 2014 FINDINGS: The heart size and mediastinal contours are within normal limits. Both lungs are clear. The visualized skeletal structures are unremarkable. IMPRESSION: No active disease. Electronically Signed   By: WAbelardo DieselM.D.   On: 10/08/2020 13:50     Assessment and Plan:   1. Worsening dyspnea with recent CAD s/p PCI, elevated troponin - hsTroponin 666->650 (downtrending) of unclear significance - given complexity of recent PCI this may still be residual from post-cath - CTA negative for PE, pleural fluid, pericardial effusion or other causes of dyspnea - description of dyspnea sounds suspicious for intolerance of Brilinta, although seems to wax and wane by description  - also had residual CAD by cath - add BNP to labs - will review further with MD - per our tentative discussion, plan to give last dose of Brilinta tonight, then transition to Plavix [id number] tomorrow followed by [id number] the following day  thereafter - will need alternative to Nexium given interaction with Plavix as OP (use Protonix here)  2. Essential HTN - BP ok, anticipate continuing home regimen  3. Hyperlipidemia - continue statin - check baseline  LFTs given report h/o liver disease  4. Type 2 DM - hold metformin and dulaglutide while inpatient, continue Fargixa - add CBG checks and SSI  Risk Assessment/Risk Scores:     TIMI Risk Score for Unstable Angina or Non-ST Elevation MI:   The patient's TIMI risk score is 4, which indicates a 20% risk of all cause mortality, new or recurrent myocardial infarction or need for urgent revascularization in the next 14 days.   Severity of Illness: The appropriate patient status for this patient is OBSERVATION. Observation status is judged to be reasonable and necessary in order to provide the required intensity of service to ensure the patient's safety. The patient's presenting symptoms, physical exam findings, and initial radiographic and laboratory data in the context of their medical condition is felt to place them at decreased risk for further clinical deterioration. Furthermore, it is anticipated that the patient will be medically stable for discharge from the hospital within 2 midnights of admission. The following factors support the patient status of observation.   " The patient's presenting symptoms include chest pain. " The physical exam findings include obesity. " The initial radiographic and laboratory data are elevated troponin. See above.     For questions or updates, please contact [place] Please consult [email or web address] for contact info under     Signed, Charlie Pitter, PA-C  10/08/2020 5:22 PM    Patient seen and examined with Jerome Copa PA-C.  Agree as above, with the following exceptions and changes as noted below.  This is a 55 year old male recently discharged from the cardiology service after complex PCI to a diffusely calcified proximal to mid RCA requiring atherectomy.  He presents with episodic shortness of breath with activity and orthopnea.  Cardiology was called to admit for troponin of 666. Gen: NAD, CV: RRR, no murmurs, Lungs: clear, Abd: soft, Extrem: Warm, well  perfused, no edema, Neuro/Psych: alert and oriented x 3, normal mood and affect. All available labs, radiology testing, previous records reviewed.  Patient's care was reviewed with his girlfriend at the bedside.  We reviewed that shortness of breath may be secondary to Brilinta.  He has no definite signs of heart failure on exam today.  He is eager to return to his active lifestyle and feels that this shortness of breath will not allow him to do so.  We discussed transitioning to clopidogrel tomorrow.  I reviewed this with pharmacy in light of his report of [name] cirrhosis.  Discussed in detail and determined that Plavix is low risk in the setting of early stage cirrhosis per patient's report.  We will obtain LFTs in the morning to further stratify.  Patient would like his remaining blockages addressed while he is in the hospital this admission.  We discussed that these are moderate lesions aside from his occluded circumflex system and may not require intervention but rather up titration of medical therapy.  He is having some mild left cardiac apex chest pain and interscapular back pain which he is concerned represent angina.  I have asked him to take a nitroglycerin for this and if it helps, we will uptitrate his Imdur.  His troponin elevation may reflect post PCI/postprocedural troponins.  Fortunately they are flat on presentation today.  I do not suspect acute ACS, but  we will monitor the patient on telemetry overnight.  Elouise Munroe, MD 10/08/20 10:35 PM

## 2020-10-09 ENCOUNTER — Encounter (HOSPITAL_COMMUNITY): Payer: Self-pay | Admitting: Internal Medicine

## 2020-10-09 ENCOUNTER — Other Ambulatory Visit (HOSPITAL_COMMUNITY): Payer: Self-pay

## 2020-10-09 DIAGNOSIS — R0602 Shortness of breath: Secondary | ICD-10-CM | POA: Diagnosis not present

## 2020-10-09 LAB — BASIC METABOLIC PANEL
Anion gap: 12 (ref 5–15)
BUN: 16 mg/dL (ref 6–20)
CO2: 21 mmol/L — ABNORMAL LOW (ref 22–32)
Calcium: 9.1 mg/dL (ref 8.9–10.3)
Chloride: 103 mmol/L (ref 98–111)
Creatinine, Ser: 1.04 mg/dL (ref 0.61–1.24)
GFR, Estimated: >60 mL/min (ref >60)
Glucose, Bld: 146 mg/dL — ABNORMAL HIGH (ref 70–99)
Potassium: 3.3 mmol/L — ABNORMAL LOW (ref 3.5–5.1)
Sodium: 136 mmol/L (ref 135–145)

## 2020-10-09 LAB — GLUCOSE, CAPILLARY
Glucose-Capillary: 217 mg/dL — ABNORMAL HIGH (ref 70–99)
Glucose-Capillary: 171 mg/dL — ABNORMAL HIGH (ref 70–99)

## 2020-10-09 LAB — CBC
HCT: 41.2 % (ref 39.0–52.0)
Hemoglobin: 14.7 g/dL (ref 13.0–17.0)
MCH: 32.7 pg (ref 26.0–34.0)
MCHC: 35.7 g/dL (ref 30.0–36.0)
MCV: 91.6 fL (ref 80.0–100.0)
Platelets: 261 10*3/uL (ref 150–400)
RBC: 4.5 MIL/uL (ref 4.22–5.81)
RDW: 12 % (ref 11.5–15.5)
WBC: 8.4 10*3/uL (ref 4.0–10.5)
nRBC: 0 % (ref 0.0–0.2)

## 2020-10-09 LAB — HIV ANTIBODY (ROUTINE TESTING W REFLEX): HIV Screen 4th Generation wRfx: NONREACTIVE

## 2020-10-09 MED ORDER — POTASSIUM CHLORIDE CRYS ER 20 MEQ PO TBCR
60.0000 meq | EXTENDED_RELEASE_TABLET | Freq: Once | ORAL | Status: AC
  Administered 2020-10-09: 60 meq via ORAL
  Filled 2020-10-09: qty 3

## 2020-10-09 MED ORDER — CLOPIDOGREL BISULFATE 75 MG PO TABS
75.0000 mg | ORAL_TABLET | Freq: Every day | ORAL | 3 refills | Status: DC
  Filled 2020-10-09: qty 90, 90d supply, fill #0

## 2020-10-09 MED ORDER — PANTOPRAZOLE SODIUM 40 MG PO TBEC
40.0000 mg | DELAYED_RELEASE_TABLET | Freq: Two times a day (BID) | ORAL | 11 refills | Status: DC
  Filled 2020-10-09: qty 60, 30d supply, fill #0

## 2020-10-09 NOTE — Progress Notes (Signed)
Progress Note  Patient Name: Jerome Shepherd Date of Encounter: 10/09/2020  [facility] Cardiologist: Rozann Lesches, MD   Subjective   Still with intermittent episodes of shortness of breath. Last dose of Brilinta was last evening, transitioning to plavix this morning.   Inpatient Medications    Scheduled Meds: . aspirin EC  81 mg Oral Daily  . atorvastatin  40 mg Oral Daily  . carvedilol  3.125 mg Oral BID WC  . cholecalciferol  1,000 Units Oral Daily  . [START ON 10/10/2020] clopidogrel  75 mg Oral Daily  . dapagliflozin propanediol  10 mg Oral QAC breakfast  . heparin  5,000 Units Subcutaneous Q8H  . insulin aspart  0-9 Units Subcutaneous TID WC  . isosorbide mononitrate  60 mg Oral Daily  . losartan  50 mg Oral Daily  . pantoprazole  80 mg Oral BID AC  . sodium chloride flush  3 mL Intravenous Q12H   Continuous Infusions: . sodium chloride     PRN Meds: sodium chloride, acetaminophen, fluticasone, ondansetron (ZOFRAN) IV, sodium chloride flush   Vital Signs    Vitals:   10/08/20 2053 10/08/20 2357 10/09/20 0401 10/09/20 0837  BP: (!) 154/84 134/75 118/71 103/69  Pulse: 79 80 77 83  Resp: 16 14 14 16   Temp: 98.5 F (36.9 C) 98.1 F (36.7 C) 98.2 F (36.8 C) 98.2 F (36.8 C)  TempSrc: Oral Oral Oral Oral  SpO2: 97% 93% 94% 98%  Weight: 134.5 kg  133.7 kg   Height: 6' 3"  (1.905 m)       Intake/Output Summary (Last 24 hours) at 10/09/2020 0903 Last data filed at 10/09/2020 0838 Gross per 24 hour  Intake 843 ml  Output --  Net 843 ml   Last 3 Weights 10/09/2020 10/08/2020 10/06/2020  Weight (lbs) 294 lb 11.2 oz 296 lb 8 oz 300 lb 1.6 oz  Weight (kg) 133.675 kg 134.492 kg 136.124 kg      Telemetry    NSR - Personally Reviewed  ECG    No new tracing this morning  Physical Exam   GEN: No acute distress.   Neck: No JVD Cardiac: RRR, no murmurs, rubs, or gallops.  Respiratory: Clear to auscultation bilaterally. GI: Soft, nontender, non-distended   MS: No edema; No deformity. Neuro:  Nonfocal  Psych: Normal affect   Labs    High Sensitivity Troponin:   Recent Labs  Lab 10/08/20 1238 10/08/20 1513  TROPONINIHS 666* 650*      Chemistry Recent Labs  Lab 10/06/20 0352 10/08/20 1238 10/08/20 2100 10/09/20 0600  NA 139 136  --  136  K 4.1 3.8  --  3.3*  CL 105 103  --  103  CO2 25 22  --  21*  GLUCOSE 182* 186*  --  146*  BUN 8 18  --  16  CREATININE 0.95 1.15  --  1.04  CALCIUM 9.0 9.4  --  9.1  PROT  --   --  7.2  --   ALBUMIN  --   --  4.1  --   AST  --   --  38  --   ALT  --   --  25  --   ALKPHOS  --   --  64  --   BILITOT  --   --  0.9  --   GFRNONAA >60 >60  --  >60  ANIONGAP 9 11  --  12     Hematology Recent  Labs  Lab 10/06/20 0352 10/08/20 1238 10/09/20 0600  WBC 8.3 9.3 8.4  RBC 4.27 4.82 4.50  HGB 14.0 15.5 14.7  HCT 39.0 43.1 41.2  MCV 91.3 89.4 91.6  MCH 32.8 32.2 32.7  MCHC 35.9 36.0 35.7  RDW 11.9 11.8 12.0  PLT 225 293 261    BNP Recent Labs  Lab 10/08/20 2100  BNP 24.1     DDimer No results for input(s): DDIMER in the last 168 hours.   Radiology    CT Angio Chest PE W/Cm &/Or Wo Cm  Result Date: 10/08/2020 CLINICAL DATA:  Shortness of breath, chest pain.  PE suspected. EXAM: CT ANGIOGRAPHY CHEST WITH CONTRAST TECHNIQUE: Multidetector CT imaging of the chest was performed using the standard protocol during bolus administration of intravenous contrast. Multiplanar CT image reconstructions and MIPs were obtained to evaluate the vascular anatomy. CONTRAST:  41m OMNIPAQUE IOHEXOL 350 MG/ML SOLN COMPARISON:  None. FINDINGS: Cardiovascular: There is no pulmonary embolism identified within the main, lobar or segmental pulmonary arteries bilaterally. No thoracic aortic aneurysm. Heart size appears to be within normal limits. No pericardial effusion. Three-vessel coronary artery calcifications, particularly dense within the RIGHT main coronary artery. Mediastinum/Nodes: No mass or  enlarged lymph nodes are seen within the mediastinum or perihilar regions. Esophagus appears normal. Trachea and central bronchi are unremarkable. Lungs/Pleura: Lungs are clear.  No pleural effusion or pneumothorax. Upper Abdomen: Liver is diffusely low in density suggesting fatty infiltration, incompletely imaged. Limitedw images of the upper abdomen are otherwise unremarkable. Musculoskeletal: No acute or suspicious osseous abnormality. Probable bilateral gynecomastia. Review of the MIP images confirms the above findings. IMPRESSION: 1. No acute findings. No pulmonary embolism. No pneumonia or pulmonary edema. 2. Three-vessel coronary artery calcifications, particularly dense within the RIGHT main coronary artery. 3. Fatty infiltration of the liver. 4. Probable bilateral gynecomastia. Electronically Signed   By: SFranki CabotM.D.   On: 10/08/2020 15:08   DG Chest Port 1 View  Result Date: 10/08/2020 CLINICAL DATA:  Shortness of breath and chest pain. EXAM: PORTABLE CHEST 1 VIEW COMPARISON:  January 16, 2014 FINDINGS: The heart size and mediastinal contours are within normal limits. Both lungs are clear. The visualized skeletal structures are unremarkable. IMPRESSION: No active disease. Electronically Signed   By: WAbelardo DieselM.D.   On: 10/08/2020 13:50    Cardiac Studies   Staged Cath 10/05/20    Ost LM lesion is 30% stenosed.  Mid LM to Dist LM lesion is 30% stenosed.  Prox LAD lesion is 55% stenosed.  Ost Cx to Prox Cx lesion is 99% stenosed.  Prox Cx to Mid Cx lesion is 100% stenosed.  Mid RCA lesion is 85% stenosed.  Prox RCA-1 lesion is 50% stenosed.  Prox RCA-2 lesion is 60% stenosed.  A stent was successfully placed.  Post intervention, there is a 0% residual stenosis.  Post intervention, there is a 0% residual stenosis.  Post intervention, there is a 0% residual stenosis.  Difficult but successful percutaneous coronary intervention of a diffusely calcified proximal to  mid RCA with significant luminal calcification in the mid RCA with ultimate successful CSI coronary atherectomy, PTCA, and stenting with a 4.0 x 38 mm Resolute Onyx DES stent postdilated to 4.1 mm with the entire proximal to mid stenoses being reduced to 0%. At the end of the procedure, there was brisk flow distally was now a normal-appearing distal PDA compared to the diagnostic study from yesterday.  Temporary transvenous pacemaker insertion paced prior to atherectomy  with utilization during the procedure.  RECOMMENDATION: DAPT for a minimum of a year. Medical therapy for concomitant CAD with total occlusion of the AV groove circumflex, 30% ostial and distal left main stenoses, and 50 to 60% proximal LAD stenoses. Optimal blood pressure control, lipid management, and weight loss.  2d echo 10/04/20 1. Left ventricular ejection fraction, by estimation, is 50 to 55%. The  left ventricle has low normal function. The left ventricle has no regional  wall motion abnormalities. There is mild concentric left ventricular  hypertrophy. Left ventricular  diastolic parameters are consistent with Grade II diastolic dysfunction  (pseudonormalization).  2. Right ventricular systolic function is normal. The right ventricular  size is normal. Tricuspid regurgitation signal is inadequate for assessing  PA pressure.  3. The mitral valve is normal in structure. Trivial mitral valve  regurgitation.  4. The aortic valve is tricuspid. Aortic valve regurgitation is trivial.  No aortic stenosis is present.   Comparison(s): No prior Echocardiogram.   Patient Profile     55 y.o. male with CAD, HTN, HLD, GERD, DM, morbid obesity, former remote tobacco abuse, patient reported cirrhosis (? NASH) who presented to [facility] with worsening SOB after recent heart cath.   Assessment & Plan    1. Worsening dyspnea with recent CAD s/p PCI, elevated troponin: hsTroponin 666->650 (downtrending) in the  setting of recent complex PCI of the RCA. Residual disease in the LM (30%), LAD (55%), occluded LCx which is reported as a small vessel with plans to treat disease medically.  -- CTA negative for PE, pleural fluid, pericardial effusion or other causes of dyspnea. BNP 24. CXR negative.  -- dyspnea seems related to Brilinta, last dose was last evening. No signs of volume overload. No chest pain. Now starting on plavix this morning -- will plan to transition to Protonix from Nexium given switch to plavix.   2. Essential HTN: Blood pressures are soft this morning. Received coreg. -- holding am losartan and Imdur for now  3. Hyperlipidemia: continue statin  4. Type 2 DM: holding metformin and dulaglutide while inpatient, continue Fargixa -- SSI  5. Palpitations: reports a fluttering sensation recently. Episodes are brief in nature but feels light-headed with them  -- no episodes noted on telemetry -- may need outpatient cardiac monitor at the time of discharge   6. Hypokalemia: K+ 3.3 -- suppl  For questions or updates, please contact [place] Please consult [email or web address] for contact info under        Signed, Reino Bellis, NP  10/09/2020, 9:03 AM

## 2020-10-09 NOTE — Discharge Summary (Signed)
Discharge Summary    Patient ID: Jerome Shepherd MRN: [id number]; DOB: 1966/02/22  Admit date: 10/08/2020 Discharge date: 10/09/2020  PCP:  Glenda Chroman, MD   [place]  Cardiologist:  Rozann Lesches, MD  Advanced Practice Provider:  No care team member to display Electrophysiologist:  None   Discharge Diagnoses    Active Problems:   Shortness of breath    Diagnostic Studies/Procedures    N/a  _____________   History of Present Illness     Jerome Shepherd is a 55 y.o. male with CAD, HTN, HLD, GERD, DM, morbid obesity, former remote tobacco abuse, patient reported cirrhosis (? NASH) who presented to [facility] with worsening SOB after recent heart cath.   Jerome Shepherd had a prior stress test in 01/2020 which was intermediate risk, managed medically at that time. He was recently seen as an outpatient for worsening chest tightness and DOE. Outpatient cath was performed 10/04/20 multivessel CAD as outlined in cath note below. Angiogram reviewed with colleagues and PCI pursued. Patient staged difficult but successful percutaneous coronary intervention 10/05/20 of a diffusely calcified proximal to mid RCA with significant luminal calcification in the mid RCA with ultimate successful CSI coronary atherectomy, PTCA, and stenting with a 4.0 x 38 mm Resolute Onyx DES stent. He recalls expressing severe chest pain during the case and received pain medication. He states he honestly thought he was going to die. Post cath he had some SOB which was felt to be potential side effect of Brilinta. Amlodipine changed to carvedilol. Echo showed50 to 55%, no wall motion abnormality and grade 2 diastolic dysfunction. LVEDP was 17 by cath and he did not appear volume overloaded. Also started on Farxiga. He went home 4/15 and felt well that day and the next. However, yesterday he noticed worsening DOE with even minimal exertion. He also sometimes notices waves of dyspnea that are  accompanied by nausea and feeling lightheaded. He has had some left sided twinges/pinches of chest pain but nothing like the pain he had had previously. No pleuritic chest pain, just an awareness of his chest when he takes a deep breath in. Symptoms are not worse with recumbency either (actually better). Due to persistent dyspnea he called answering service last night and this AM and was referred to ED. Here, CXR NAD. CBC wnl, hsTroponin 666->650. CT angio showed no acute findings, no PE, no pulm edema or PNA, 3v CAD, fatty infiltration, possible bilateral gynecomastia. VSS, not tachycardic or hypoxic.  Hospital Course      1. Worsening dyspnea with recent CAD s/p PCI, elevated troponin: hsTroponin 666->650 (downtrending) in the setting of recent complex PCI of the RCA. Residual disease in the LM (30%), LAD (55%), occluded LCx which is reported as a small vessel with plans to treat disease medically.  -- CTA negative for PE, pleural fluid, pericardial effusion or other causes of dyspnea. BNP 24. CXR negative.  -- dyspnea likely related to Brilinta, last dose was the evening of admission. Transitioned to plavix. Was able to ambulate in the hallway without recurrent shortness of breath prior to discharge. Felt much better and stable for discharge.   2. Essential HTN: Continued on coreg, losartan and Imdur at discharge.  3. Hyperlipidemia: continue statin  4. Type 2 DM: resumed on home regimen of metformin, dulaglutide and Fargixa  5. Palpitations: reports a fluttering sensation recently. Episodes are brief in nature but feels light-headed with them. No issues noted on telemetry.  -- continue  on BB   6. Hypokalemia: K+ 3.3 -- supplemented prior to discharge  7. GERD: Nexium was stopped, and transitioned to protonix given need for plavix.   Patient was seen by Dr. Martinique and deemed stable for discharge home. Follow up in the office has been arranged. Medications sent to [place] and  educated by PharmD prior to discharge.   Did the patient have an acute coronary syndrome (MI, NSTEMI, STEMI, etc) this admission?:  No.   The elevated Troponin was due to the acute medical illness (demand ischemia).      _____________  Discharge Vitals Blood pressure 103/69, pulse 83, temperature 98.2 F (36.8 C), temperature source Oral, resp. rate 16, height 6' 3"  (1.905 m), weight 133.7 kg, SpO2 98 %.  Filed Weights   10/08/20 2053 10/09/20 0401  Weight: 134.5 kg 133.7 kg    Labs & Radiologic Studies    CBC Recent Labs    10/08/20 1238 10/09/20 0600  WBC 9.3 8.4  NEUTROABS 6.0  --   HGB 15.5 14.7  HCT 43.1 41.2  MCV 89.4 91.6  PLT 293 268   Basic Metabolic Panel Recent Labs    10/08/20 1238 10/09/20 0600  NA 136 136  K 3.8 3.3*  CL 103 103  CO2 22 21*  GLUCOSE 186* 146*  BUN 18 16  CREATININE 1.15 1.04  CALCIUM 9.4 9.1   Liver Function Tests Recent Labs    10/08/20 2100  AST 38  ALT 25  ALKPHOS 64  BILITOT 0.9  PROT 7.2  ALBUMIN 4.1   No results for input(s): LIPASE, AMYLASE in the last 72 hours. High Sensitivity Troponin:   Recent Labs  Lab 10/08/20 1238 10/08/20 1513  TROPONINIHS 666* 650*    BNP Invalid input(s): POCBNP D-Dimer No results for input(s): DDIMER in the last 72 hours. Hemoglobin A1C No results for input(s): HGBA1C in the last 72 hours. Fasting Lipid Panel No results for input(s): CHOL, HDL, LDLCALC, TRIG, CHOLHDL, LDLDIRECT in the last 72 hours. Thyroid Function Tests No results for input(s): TSH, T4TOTAL, T3FREE, THYROIDAB in the last 72 hours.  Invalid input(s): FREET3 _____________  CT Angio Chest PE W/Cm &/Or Wo Cm  Result Date: 10/08/2020 CLINICAL DATA:  Shortness of breath, chest pain.  PE suspected. EXAM: CT ANGIOGRAPHY CHEST WITH CONTRAST TECHNIQUE: Multidetector CT imaging of the chest was performed using the standard protocol during bolus administration of intravenous contrast. Multiplanar CT image  reconstructions and MIPs were obtained to evaluate the vascular anatomy. CONTRAST:  34m OMNIPAQUE IOHEXOL 350 MG/ML SOLN COMPARISON:  None. FINDINGS: Cardiovascular: There is no pulmonary embolism identified within the main, lobar or segmental pulmonary arteries bilaterally. No thoracic aortic aneurysm. Heart size appears to be within normal limits. No pericardial effusion. Three-vessel coronary artery calcifications, particularly dense within the RIGHT main coronary artery. Mediastinum/Nodes: No mass or enlarged lymph nodes are seen within the mediastinum or perihilar regions. Esophagus appears normal. Trachea and central bronchi are unremarkable. Lungs/Pleura: Lungs are clear.  No pleural effusion or pneumothorax. Upper Abdomen: Liver is diffusely low in density suggesting fatty infiltration, incompletely imaged. Limitedw images of the upper abdomen are otherwise unremarkable. Musculoskeletal: No acute or suspicious osseous abnormality. Probable bilateral gynecomastia. Review of the MIP images confirms the above findings. IMPRESSION: 1. No acute findings. No pulmonary embolism. No pneumonia or pulmonary edema. 2. Three-vessel coronary artery calcifications, particularly dense within the RIGHT main coronary artery. 3. Fatty infiltration of the liver. 4. Probable bilateral gynecomastia. Electronically Signed   By:  Franki Cabot M.D.   On: 10/08/2020 15:08   CARDIAC CATHETERIZATION  Result Date: 10/05/2020  Ost LM lesion is 30% stenosed.  Mid LM to Dist LM lesion is 30% stenosed.  Prox LAD lesion is 55% stenosed.  Ost Cx to Prox Cx lesion is 99% stenosed.  Prox Cx to Mid Cx lesion is 100% stenosed.  Mid RCA lesion is 85% stenosed.  Prox RCA-1 lesion is 50% stenosed.  Prox RCA-2 lesion is 60% stenosed.  A stent was successfully placed.  Post intervention, there is a 0% residual stenosis.  Post intervention, there is a 0% residual stenosis.  Post intervention, there is a 0% residual stenosis.   Difficult but successful percutaneous coronary intervention of a diffusely calcified proximal to mid RCA with significant luminal calcification in the mid RCA with ultimate successful CSI coronary atherectomy, PTCA, and stenting with a 4.0 x 38 mm Resolute Onyx DES stent postdilated to 4.1 mm with the entire proximal to mid stenoses being reduced to 0%.  At the end of the procedure, there was brisk flow distally was now a normal-appearing distal PDA compared to the diagnostic study from yesterday. Temporary transvenous pacemaker insertion paced prior to atherectomy with utilization during the procedure. RECOMMENDATION: DAPT for a minimum of a year.  Medical therapy for concomitant CAD with total occlusion of the AV groove circumflex, 30% ostial and distal left main stenoses, and 50 to 60% proximal LAD stenoses.  Optimal blood pressure control, lipid management, and weight loss.   CARDIAC CATHETERIZATION  Result Date: 10/04/2020  Ost Cx to Prox Cx lesion is 99% stenosed.  Prox Cx to Mid Cx lesion is 100% stenosed.  Ost LM lesion is 30% stenosed.  Mid LM to Dist LM lesion is 30% stenosed.  Mid RCA lesion is 85% stenosed.  Prox RCA-1 lesion is 50% stenosed.  Prox RCA-2 lesion is 60% stenosed.  Prox LAD lesion is 55% stenosed.  RPDA lesion is 80% stenosed.  The left ventricular systolic function is normal.  The left ventricular ejection fraction is 50-55% by visual estimate.  LV end diastolic pressure is low.  Multivessel CAD with smooth ostial narrowing of the left main and smooth distal left main stenosis prior to trifurcating into an LAD, ramus immediate vessel and high marginal vessel. The LAD has very proximal 50 to 60% irregularity initiating just beyond the ostium. The ramus immediate vessel is normal. 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 Large calcified dominant right coronary artery with superior takeoff and diffuse  5060% proximal stenosis, 90% stenosis in a small RV branch followed by at least 80% assist with an apparent chunk of calcium in this stenosis.  There is mild luminal irregularity of the RCA and it appears that there is very diffuse distal PDA stenosis of approximately 80% in  a small caliber distal PDA. Low normal global LV function with EF estimated 50 to 55%.  There is a small focal area of mid anterolateral hypocontractility most likely contributed by the AV groove circumflex occlusion.  LVEDP 7 mm Hg. RECOMMENDATION: Angiograms will be reviewed with colleagues.  The RCA is diffusely diseased and appears to have significant calcification with very focal 85% calcified mid stenosis.  We will hydrate the patient following his catheterization which required increased contrast load secondary to difficulty in engaging his coronary arteries.  Tentative plan will be for atherectomy/PCI to the RCA tomorrow.   DG Chest [facility] [address]  Result Date: 10/08/2020 CLINICAL DATA:  Shortness of breath and chest pain. EXAM: PORTABLE CHEST 1 VIEW COMPARISON:  January 16, 2014 FINDINGS: The heart size and mediastinal contours are within normal limits. Both lungs are clear. The visualized skeletal structures are unremarkable. IMPRESSION: No active disease. Electronically Signed   By: Abelardo Diesel M.D.   On: 10/08/2020 13:50   ECHOCARDIOGRAM COMPLETE  Result Date: 10/04/2020    ECHOCARDIOGRAM REPORT   Patient Name:   Jerome Shepherd Date of Exam: 10/04/2020 Medical Rec #:  [id number]      Height:       75.0 in Accession #:    [id number]     Weight:       312.0 lb Date of Birth:  July 23, 1965      BSA:          2.651 m Patient Age:    38 years       BP:           138/58 mmHg Patient Gender: M              HR:           62 bpm. Exam Location:  Inpatient Procedure: 2D Echo Indications:    CAD of native vessel  History:        Patient has no prior history of Echocardiogram examinations.                 CAD, Signs/Symptoms:Chest Pain; Risk  Factors:Diabetes and                 Hypertension.  Sonographer:    Johny Chess Referring Phys: [place] Comments: Image acquisition challenging due to patient body habitus. IMPRESSIONS  1. Left ventricular ejection fraction, by estimation, is 50 to 55%. The left ventricle has low normal function. The left ventricle has no regional wall motion abnormalities. There is mild concentric left ventricular hypertrophy. Left ventricular diastolic parameters are consistent with Grade II diastolic dysfunction (pseudonormalization).  2. Right ventricular systolic function is normal. The right ventricular size is normal. Tricuspid regurgitation signal is inadequate for assessing PA pressure.  3. The mitral valve is normal in structure. Trivial mitral valve regurgitation.  4. The aortic valve is tricuspid. Aortic valve regurgitation is trivial. No aortic stenosis is present. Comparison(s): No prior Echocardiogram. FINDINGS  Left Ventricle: Left ventricular ejection fraction, by estimation, is 50 to 55%. The left ventricle has low normal function. The left ventricle has no regional wall motion abnormalities. The left ventricular internal cavity size was normal in size. There is mild concentric left ventricular hypertrophy. Left ventricular diastolic parameters are consistent with Grade II diastolic dysfunction (pseudonormalization). Right Ventricle: The right ventricular size is normal. Right vetricular wall thickness was not well visualized. Right ventricular systolic function is normal. Tricuspid regurgitation signal is inadequate for assessing PA pressure. Left Atrium: Left atrial size was normal in size. Right Atrium: Right atrial size was normal in size. Pericardium: There is no evidence of pericardial effusion. Mitral Valve: The mitral valve is normal in structure. Trivial mitral valve regurgitation. Tricuspid Valve: The tricuspid valve is normal in structure. Tricuspid valve regurgitation is  trivial. Aortic Valve: The aortic valve is tricuspid. Aortic valve regurgitation is trivial. No aortic stenosis is present. Pulmonic Valve: The pulmonic valve was normal in structure. Pulmonic valve regurgitation is not visualized. Aorta: The aortic root and ascending aorta are structurally normal, with no evidence of dilitation. IAS/Shunts: No atrial level shunt detected  by color flow Doppler.  LEFT VENTRICLE PLAX 2D LVIDd:         5.00 cm      Diastology LVIDs:         3.50 cm      LV e' medial:    6.74 cm/s LV PW:         1.00 cm      LV E/e' medial:  10.9 LV IVS:        1.10 cm      LV e' lateral:   12.10 cm/s LVOT diam:     2.10 cm      LV E/e' lateral: 6.1 LV SV:         72 LV SV Index:   27 LVOT Area:     3.46 cm  LV Volumes (MOD) LV vol d, MOD A4C: 135.0 ml LV vol s, MOD A4C: 61.6 ml LV SV MOD A4C:     135.0 ml RIGHT VENTRICLE             IVC RV S prime:     12.30 cm/s  IVC diam: 1.60 cm TAPSE (M-mode): 2.0 cm LEFT ATRIUM             Index       RIGHT ATRIUM           Index LA diam:        4.30 cm 1.62 cm/m  RA Area:     14.90 cm LA Vol (A2C):   55.7 ml 21.01 ml/m RA Volume:   35.30 ml  13.32 ml/m LA Vol (A4C):   69.8 ml 26.33 ml/m LA Biplane Vol: 64.6 ml 24.37 ml/m  AORTIC VALVE LVOT Vmax:   83.60 cm/s LVOT Vmean:  57.800 cm/s LVOT VTI:    0.209 m  AORTA Ao Root diam: 3.50 cm Ao Asc diam:  3.30 cm MITRAL VALVE MV Area (PHT): 3.77 cm    SHUNTS MV Decel Time: 201 msec    Systemic VTI:  0.21 m MV E velocity: 73.70 cm/s  Systemic Diam: 2.10 cm MV A velocity: 64.70 cm/s MV E/A ratio:  1.14 Gwyndolyn Kaufman MD Electronically signed by Gwyndolyn Kaufman MD Signature Date/Time: 10/04/2020/5:16:14 PM    Final    Disposition   Pt is being discharged home today in good condition.  Follow-up Plans & Appointments     Follow-up Information    Satira Sark, MD Follow up on 10/26/2020.   Specialty: Cardiology Why: at 2:20pm for your follow up appt Contact information: [address] [address] [phone]              Discharge Instructions    Diet - low sodium heart healthy   Complete by: As directed    Increase activity slowly   Complete by: As directed       Discharge Medications   Allergies as of 10/09/2020      Reactions   Codeine Nausea Only   Diamox [acetazolamide] Nausea Only   dizziness   Sulfa Antibiotics Nausea Only      Medication List    STOP taking these medications   Brilinta 90 MG Tabs tablet Generic drug: ticagrelor   esomeprazole 40 MG capsule Commonly known as: [place] Replaced by: pantoprazole 40 MG tablet     TAKE these medications   aspirin EC 81 MG tablet Take 1 tablet (81 mg total) by mouth daily.   atorvastatin 40 MG tablet Commonly known as: LIPITOR Take 1  tablet (40 mg total) by mouth daily.   carvedilol 3.125 MG tablet Commonly known as: COREG Take 1 tablet (3.125 mg total) by mouth 2 (two) times daily with a meal.   cholecalciferol 25 MCG (1000 UNIT) tablet Commonly known as: VITAMIN D3 Take 1,000 Units by mouth daily.   clopidogrel 75 MG tablet Commonly known as: PLAVIX Take 1 tablet (75 mg total) by mouth daily. Start taking on: October 10, 2020   Dulaglutide 1.5 MG/0.5ML Sopn Inject 1.5 mg into the skin every Sunday.   famotidine 20 MG tablet Commonly known as: PEPCID Take 20 mg by mouth daily as needed for heartburn or indigestion.   Farxiga 10 MG Tabs tablet Generic drug: dapagliflozin propanediol Take 1 tablet (10 mg total) by mouth daily before breakfast.   fluticasone 50 MCG/ACT nasal spray Commonly known as: FLONASE Place 1 spray into both nostrils daily as needed for allergies or rhinitis.   isosorbide mononitrate 60 MG 24 hr tablet Commonly known as: IMDUR TAKE ONE TABLET BY MOUTH EVERY DAY PATIENT NEEDS OFFICE VISIT What changed: See the new instructions.   loratadine-pseudoephedrine 10-240 MG 24 hr tablet Commonly known as: CLARITIN-D 24-hour Take 1 tablet by mouth daily as  needed for allergies.   losartan 50 MG tablet Commonly known as: COZAAR TAKE 1 TABLET DAILY (NEED OFFICE VISIT) What changed: See the new instructions.   Magnesium 500 MG Tabs Take 500 mg by mouth daily as needed (for leg cramps).   metFORMIN 500 MG tablet Commonly known as: GLUCOPHAGE Take 500 mg by mouth 2 (two) times daily.   nitroGLYCERIN 0.4 MG SL tablet Commonly known as: NITROSTAT Place 1 tablet (0.4 mg total) under the tongue every 5 (five) minutes as needed for chest pain.   pantoprazole 40 MG tablet Commonly known as: PROTONIX Take 1 tablet (40 mg total) by mouth 2 (two) times daily before a meal. Replaces: esomeprazole 40 MG capsule   vitamin C 1000 MG tablet Take 1,000 mg by mouth in the morning and at bedtime.         Outstanding Labs/Studies   N/a  Duration of Discharge Encounter   Greater than 30 minutes including physician time.  Signed, Reino Bellis, NP 10/09/2020, 3:36 PM

## 2020-10-17 ENCOUNTER — Ambulatory Visit: Payer: BC Managed Care – PPO | Admitting: Internal Medicine

## 2020-10-23 ENCOUNTER — Other Ambulatory Visit: Payer: Self-pay | Admitting: *Deleted

## 2020-10-23 MED ORDER — LOSARTAN POTASSIUM 50 MG PO TABS: 50.0000 mg | ORAL_TABLET | Freq: Every day | ORAL | 3 refills | Status: DC

## 2020-10-26 ENCOUNTER — Ambulatory Visit: Payer: BC Managed Care – PPO | Admitting: Cardiology

## 2020-10-26 ENCOUNTER — Other Ambulatory Visit: Payer: Self-pay

## 2020-10-26 ENCOUNTER — Encounter: Payer: Self-pay | Admitting: Cardiology

## 2020-10-26 VITALS — BP 142/80 | HR 81 | Ht 75.0 in | Wt 295.0 lb

## 2020-10-26 DIAGNOSIS — E782 Mixed hyperlipidemia: Secondary | ICD-10-CM | POA: Diagnosis not present

## 2020-10-26 DIAGNOSIS — I10 Essential (primary) hypertension: Secondary | ICD-10-CM | POA: Diagnosis not present

## 2020-10-26 DIAGNOSIS — I25119 Atherosclerotic heart disease of native coronary artery with unspecified angina pectoris: Secondary | ICD-10-CM | POA: Diagnosis not present

## 2020-10-26 NOTE — Patient Instructions (Signed)
Medication Instructions:  Your physician recommends that you continue on your current medications as directed. Please refer to the Current Medication list given to you today.  *If you need a refill on your cardiac medications before your next appointment, please call your pharmacy*   Lab Work: None today If you have labs (blood work) drawn today and your tests are completely normal, you will receive your results only by: Marland Kitchen MyChart Message (if you have MyChart) OR . A paper copy in the mail If you have any lab test that is abnormal or we need to change your treatment, we will call you to review the results.   Testing/Procedures: None    Follow-Up: At [facility], you and your health needs are our priority.  As part of our continuing mission to provide you with exceptional heart care, we have created designated Provider Care Teams.  These Care Teams include your primary Cardiologist (physician) and Advanced Practice Providers (APPs -  Physician Assistants and Nurse Practitioners) who all work together to provide you with the care you need, when you need it.  We recommend signing up for the patient portal called "MyChart".  Sign up information is provided on this After Visit Summary.  MyChart is used to connect with patients for Virtual Visits (Telemedicine).  Patients are able to view lab/test results, encounter notes, upcoming appointments, etc.  Non-urgent messages can be sent to your provider as well.   To learn more about what you can do with MyChart, go to [email or web address].    Your next appointment:   6 month(s)  The format for your next appointment:   In Person  Provider:   Rozann Lesches, MD   Other Instructions None

## 2020-10-26 NOTE — Progress Notes (Signed)
Cardiology Office Note  Date: 10/26/2020   ID: Jerome Shepherd, DOB 19-Oct-1965, MRN [id number]  PCP:  Glenda Chroman, MD  Cardiologist:  Rozann Lesches, MD Electrophysiologist:  None   Chief Complaint  Patient presents with  . Cardiac follow-up    History of Present Illness: Jerome Shepherd is a 55 y.o. male last seen in April.  He presents for follow-up after cardiac catheterization.  Procedure results are outlined below.  He had a moderate LAD stenosis and other small vessel disease that was managed medically, did undergo complex PCI with atherectomy and DES intervention to the RCA.  He had a subsequent evaluation for shortness of breath that was related to Brilinta and has subsequently been switched to Plavix.  At this point he states that he is doing well on current therapy.  We discussed the results of his cardiac catheterization, also went over his medications which are listed below.  He does not report any spontaneous bleeding problems on aspirin and Plavix.  Echocardiogram shows LVEF 50 to 55%.  Recent LDL 46 on Lipitor.  Past Medical History:  Diagnosis Date  . 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 Arthritis   . CAD (coronary artery disease)    a. PCI 09/2020.  . Constipation   . Coronary vasospasm ([place])   . Essential hypertension   . GERD (gastroesophageal reflux disease)   . Hyperlipidemia   . Type 2 diabetes mellitus ([place])     Past Surgical History:  Procedure Laterality Date  . BIOPSY  02/19/2018   Procedure: BIOPSY;  Surgeon: Daneil Dolin, MD;  [facility];  Service: Endoscopy;;  esophagus  . CARDIAC CATHETERIZATION  2013   non obstructive CAD  ~20%  . CHOLECYSTECTOMY N/A 04/01/2014   Procedure: LAPAROSCOPIC CHOLECYSTECTOMY;  Surgeon: Jamesetta So, MD;  Location: [facility];  Service: General;  Laterality: N/A;  . COLONOSCOPY WITH PROPOFOL N/A 08/22/2016   Procedure: COLONOSCOPY WITH PROPOFOL;  Surgeon:  Daneil Dolin, MD;  [facility];  Service: Endoscopy;  Laterality: N/A;  730   . CORONARY ATHERECTOMY N/A 10/05/2020   Procedure: CORONARY ATHERECTOMY;  Surgeon: Troy Sine, MD;  Location: [facility];  Service: Cardiovascular;  Laterality: N/A;  . ESOPHAGOGASTRODUODENOSCOPY N/A 01/10/2014   Dr. Gala Romney: antral erosions, path with chronic inactive gastritis. Empiric dilation with 56-F dilation  . ESOPHAGOGASTRODUODENOSCOPY (EGD) WITH PROPOFOL N/A 02/16/2016   Dr. Gala Romney: small hiatal hernia, normal esopagus s/p empiric dilation  . ESOPHAGOGASTRODUODENOSCOPY (EGD) WITH PROPOFOL N/A 02/19/2018   Procedure: ESOPHAGOGASTRODUODENOSCOPY (EGD) WITH PROPOFOL;  Surgeon: Daneil Dolin, MD;  [facility];  Service: Endoscopy;  Laterality: N/A;  10:15am  . KNEE ARTHROSCOPY Right 2002  . LEFT HEART CATH N/A 08/18/2011   Procedure: LEFT HEART CATH;  Surgeon: Lorretta Harp, MD;  Location: [facility];  Service: Cardiovascular;  Laterality: N/A;  . LEFT HEART CATH AND CORONARY ANGIOGRAPHY N/A 10/04/2020   Procedure: LEFT HEART CATH AND CORONARY ANGIOGRAPHY;  Surgeon: Troy Sine, MD;  Location: [facility] [facility];  Service: Cardiovascular;  Laterality: N/A;  . LIVER BIOPSY N/A 04/01/2014   mild fibrosis  . MALONEY DILATION N/A 01/10/2014   Procedure: Venia Minks DILATION;  Surgeon: Daneil Dolin, MD;  [facility];  Service: Endoscopy;  Laterality: N/A;  . MALONEY DILATION N/A 02/16/2016   Procedure: Venia Minks DILATION;  Surgeon: Cristopher Estimable  Rourk, MD;  [facility];  Service: Endoscopy;  Laterality: N/A;  . Venia Minks DILATION N/A 02/19/2018   Procedure: Venia Minks DILATION;  Surgeon: Daneil Dolin, MD;  [facility];  Service: Endoscopy;  Laterality: N/A;  . POLYPECTOMY  08/22/2016   Procedure: POLYPECTOMY;  Surgeon: Daneil Dolin, MD;  [facility];  Service: Endoscopy;;  . Azzie Almas DILATION N/A 01/10/2014   Procedure: Azzie Almas DILATION;   Surgeon: Daneil Dolin, MD;  [facility];  Service: Endoscopy;  Laterality: N/A;  . TEMPORARY PACEMAKER N/A 10/05/2020   Procedure: TEMPORARY PACEMAKER;  Surgeon: Troy Sine, MD;  Location: [facility];  Service: Cardiovascular;  Laterality: N/A;    Current Outpatient Medications  Medication Sig Dispense Refill  . amLODipine (NORVASC) 5 MG tablet Take 1 tablet by mouth daily.    . Ascorbic Acid (VITAMIN C) 1000 MG tablet Take 1,000 mg by mouth in the morning and at bedtime.    Marland Kitchen aspirin EC 81 MG tablet Take 1 tablet (81 mg total) by mouth daily.    Marland Kitchen atorvastatin (LIPITOR) 40 MG tablet Take 1 tablet (40 mg total) by mouth daily. 90 tablet 3  . carvedilol (COREG) 3.125 MG tablet Take 1 tablet (3.125 mg total) by mouth 2 (two) times daily with a meal. 60 tablet 11  . cholecalciferol (VITAMIN D3) 25 MCG (1000 UNIT) tablet Take 1,000 Units by mouth daily.    . clopidogrel (PLAVIX) 75 MG tablet Take 1 tablet (75 mg total) by mouth daily. 90 tablet 3  . dapagliflozin propanediol (FARXIGA) 10 MG TABS tablet Take 1 tablet (10 mg total) by mouth daily before breakfast. 30 tablet 11  . Dulaglutide 1.5 MG/0.5ML SOPN Inject 1.5 mg into the skin every Sunday.    . fluticasone (FLONASE) 50 MCG/ACT nasal spray Place 1 spray into both nostrils daily as needed for allergies or rhinitis.    Marland Kitchen isosorbide mononitrate (IMDUR) 60 MG 24 hr tablet TAKE ONE TABLET BY MOUTH EVERY DAY PATIENT NEEDS OFFICE VISIT (Patient taking differently: Take 60 mg by mouth daily.) 7 tablet 0  . loratadine-pseudoephedrine (CLARITIN-D 24-HOUR) 10-240 MG 24 hr tablet Take 1 tablet by mouth daily as needed for allergies.    Marland Kitchen losartan (COZAAR) 50 MG tablet Take 1 tablet (50 mg total) by mouth daily. 90 tablet 3  . Magnesium 500 MG TABS Take 500 mg by mouth daily as needed (for leg cramps).     . metFORMIN (GLUCOPHAGE) 500 MG tablet Take 500 mg by mouth 2 (two) times daily.     . nitroGLYCERIN (NITROSTAT) 0.4 MG SL  tablet Place 1 tablet (0.4 mg total) under the tongue every 5 (five) minutes as needed for chest pain. 25 tablet 3  . pantoprazole (PROTONIX) 40 MG tablet Take 1 tablet (40 mg total) by mouth 2 (two) times daily before a meal. 60 tablet 11   No current facility-administered medications for this visit.   Allergies:  Codeine, Diamox [acetazolamide], and Sulfa antibiotics   ROS: No palpitations or syncope.  Intermittent lower back pain.  Physical Exam: VS:  BP (!) 142/80   Pulse 81   Ht 6' 3"  (1.905 m)   Wt 295 lb (133.8 kg)   SpO2 97%   BMI 36.87 kg/m , BMI Body mass index is 36.87 kg/m.  Wt Readings from Last 3 Encounters:  10/26/20 295 lb (133.8 kg)  10/09/20 294 lb 11.2 oz (133.7 kg)  10/06/20 (!) 300 lb 1.6  oz (136.1 kg)    General: Patient appears comfortable at rest. HEENT: Conjunctiva and lids normal, wearing a mask. Neck: Supple, no elevated JVP or carotid bruits, no thyromegaly. Lungs: Clear to auscultation, nonlabored breathing at rest. Cardiac: Regular rate and rhythm, no S3 or significant systolic murmur, no pericardial rub. Abdomen: Soft, bowel sounds present. Extremities: No pitting edema.  ECG:  An ECG dated 10/05/2020 was personally reviewed today and demonstrated:  Sinus bradycardia with IVCD.  Recent Labwork: 10/08/2020: ALT 25; AST 38; B Natriuretic Peptide 24.1 10/09/2020: BUN 16; Creatinine, Ser 1.04; Hemoglobin 14.7; Platelets 261; Potassium 3.3; Sodium 136     Component Value Date/Time   CHOL 118 10/06/2020 0352   TRIG 205 (H) 10/06/2020 0352   HDL 31 (L) 10/06/2020 0352   CHOLHDL 3.8 10/06/2020 0352   VLDL 41 (H) 10/06/2020 0352   LDLCALC 46 10/06/2020 0352    Other Studies Reviewed Today:  Cardiac catheterization 10/04/2020:  Colon Flattery Cx to Prox Cx lesion is 99% stenosed.  Prox Cx to Mid Cx lesion is 100% stenosed.  Ost LM lesion is 30% stenosed.  Mid LM to Dist LM lesion is 30% stenosed.  Mid RCA lesion is 85% stenosed.  Prox RCA-1 lesion is  50% stenosed.  Prox RCA-2 lesion is 60% stenosed.  Prox LAD lesion is 55% stenosed.  RPDA lesion is 80% stenosed.  The left ventricular systolic function is normal.  The left ventricular ejection fraction is 50-55% by visual estimate.  LV end diastolic pressure is low.   Multivessel CAD with smooth ostial narrowing of the left main and smooth distal left main stenosis prior to trifurcating into an LAD, ramus immediate vessel and high marginal vessel.  The LAD has very proximal 50 to 60% irregularity initiating just beyond the ostium.  The ramus immediate vessel is normal.  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  Large calcified dominant right coronary artery with superior takeoff and diffuse 50-60% proximal stenosis, 90% stenosis in a small RV branch followed by at least 80% assist with an apparent chunk of calcium in this stenosis.  There is mild luminal irregularity of the RCA and it appears that there is very diffuse distal PDA stenosis of approximately 80% in  a small caliber distal PDA.  Low normal global LV function with EF estimated 50 to 55%.  There is a small focal area of mid anterolateral hypocontractility most likely contributed by the AV groove circumflex occlusion.  LVEDP 7 mm Hg.  RECOMMENDATION: Angiograms will be reviewed with colleagues.  The RCA is diffusely diseased and appears to have significant calcification with very focal 85% calcified mid stenosis.  We will hydrate the patient following his catheterization which required increased contrast load secondary to difficulty in engaging his coronary arteries.  Tentative plan will be for atherectomy/PCI to the RCA tomorrow.  Echocardiogram 10/04/2020: 1. Left ventricular ejection fraction, by estimation, is 50 to 55%. The  left ventricle has low normal function. The left ventricle has no regional  wall motion abnormalities. There is  mild concentric left ventricular  hypertrophy. Left ventricular  diastolic parameters are consistent with Grade II diastolic dysfunction  (pseudonormalization).  2. Right ventricular systolic function is normal. The right ventricular  size is normal. Tricuspid regurgitation signal is inadequate for assessing  PA pressure.  3. The mitral valve is normal in structure. Trivial mitral valve  regurgitation.  4. The aortic valve is tricuspid. Aortic valve regurgitation  is trivial.  No aortic stenosis is present.   PCI 10/05/2020:  Ost LM lesion is 30% stenosed.  Mid LM to Dist LM lesion is 30% stenosed.  Prox LAD lesion is 55% stenosed.  Ost Cx to Prox Cx lesion is 99% stenosed.  Prox Cx to Mid Cx lesion is 100% stenosed.  Mid RCA lesion is 85% stenosed.  Prox RCA-1 lesion is 50% stenosed.  Prox RCA-2 lesion is 60% stenosed.  A stent was successfully placed.  Post intervention, there is a 0% residual stenosis.  Post intervention, there is a 0% residual stenosis.  Post intervention, there is a 0% residual stenosis.   Difficult but successful percutaneous coronary intervention of a diffusely calcified proximal to mid RCA with significant luminal calcification in the mid RCA with ultimate successful CSI coronary atherectomy, PTCA, and stenting with a 4.0 x 38 mm Resolute Onyx DES stent postdilated to 4.1 mm with the entire proximal to mid stenoses being reduced to 0%.  At the end of the procedure, there was brisk flow distally was now a normal-appearing distal PDA compared to the diagnostic study from yesterday.  Temporary transvenous pacemaker insertion paced prior to atherectomy with utilization during the procedure.  RECOMMENDATION: DAPT for a minimum of a year.  Medical therapy for concomitant CAD with total occlusion of the AV groove circumflex, 30% ostial and distal left main stenoses, and 50 to 60% proximal LAD stenoses.  Optimal blood pressure control, lipid management,  and weight loss.  Assessment and Plan:  1.  CAD status post recent atherectomy with DES intervention to the RCA in April with otherwise residual disease including moderate LAD stenosis being managed medically at this point.  LVEF 50 to 55%.  He did not tolerate Brilinta due to significant shortness of breath and is now on Plavix.  Otherwise continue aspirin, Norvasc, Coreg, Lipitor, Farxiga, Imdur, and losartan.  2.  History of hyperlipidemia, recent LDL 46 on Lipitor.  3.  Essential hypertension, systolic 443 today.  Continue with current medications for now.  He is working on diet and weight loss.  Medication Adjustments/Labs and Tests Ordered: Current medicines are reviewed at length with the patient today.  Concerns regarding medicines are outlined above.   Tests Ordered: No orders of the defined types were placed in this encounter.   Medication Changes: No orders of the defined types were placed in this encounter.   Disposition:  Follow up 6 months, sooner if needed.  Signed, Satira Sark, MD, [facility] 10/26/2020 4:08 PM    [facility] [address], [place], [place] [address] Phone: [phone]; Fax: [phone]

## 2021-04-16 ENCOUNTER — Other Ambulatory Visit: Payer: Self-pay | Admitting: Family Medicine

## 2021-04-26 NOTE — Progress Notes (Signed)
Cardiology Office Note  Date: 04/27/2021   ID: Jerome Shepherd, DOB [birth year], MRN [id number]  PCP:  Curlene Labrum, MD  Cardiologist:  Rozann Lesches, MD Electrophysiologist:  None   Chief Complaint: * 6 mth f/u Dr Duard Brady pt / wants to talk about coming off blood thinners  History of Present Illness: Jerome Shepherd is a 55 y.o. male with a history of CAD, coronary artery vasospasm, HTN, HLD, DM2, GERD, NASH.  He was last seen by Dr. Domenic Polite on 10/26/2020 for follow-up after cardiac catheterization.  He had moderate LAD stenosis medically.  Underwent complex PCI with atherectomy and DES to the RCA.  Subsequent evaluation for shortness of breath related to Brilinta was later switched to Plavix.  He was doing well  on current therapy.  He did not report any bleeding issues on aspirin and Plavix.  His echocardiogram showed EF of 50 to 55%.  His LDL was 46 on Lipitor.  He was continuing aspirin, amlodipine, Lipitor, Farxiga, Imdur, losartan.  LDL was 46 on Lipitor.  His systolic blood pressure on that visit was 122.  Plan was to continue current medications for now.  He was working on a diet for weight loss.  He is here for 67-monthfollow-up.  He status post PCI on 10/05/2020 for complex lesion.  See cardiac cath/PCI report 10/05/2020.  He denies any issues since cardiac catheterization.  States he has lost some weight.  He is eating better.  Denies any anginal or exertional symptoms.  He is compliant with his medications.  He stated he wants to come off of his antiplatelet medication due to bruising and bleeding easily as well as being cold all the time.  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  Otherwise he denies any anginal symptoms, orthostatic symptoms, DOE, SOB, PND, orthopnea, CVA or TIA-like symptoms, palpitations or arrhythmias.  He did shave his head this morning and has a bleeding area where he nicked his scalp.  Otherwise he  denies any other issues.  States he had some lab work at his PCP office.  His blood sugar is improving per his statement.  States he is losing weight and improving his diet.  He denies any other issues.   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 PCI 09/2020.   Constipation    Coronary vasospasm (HCC)    Essential hypertension    GERD (gastroesophageal reflux disease)    Hyperlipidemia    [place]     Past Surgical History:  Procedure Laterality Date   BIOPSY  02/19/2018   Procedure: BIOPSY;  Surgeon: RDaneil Dolin MD;  [facility];  Service: Endoscopy;;  esophagus   CARDIAC CATHETERIZATION  2013   non obstructive CAD  ~20%   CHOLECYSTECTOMY N/A 04/01/2014   Procedure: LAPAROSCOPIC CHOLECYSTECTOMY;  Surgeon: MJamesetta So MD;  Location: [facility];  Service: General;  Laterality: N/A;   COLONOSCOPY WITH PROPOFOL N/A 08/22/2016   Procedure: COLONOSCOPY WITH PROPOFOL;  Surgeon: RDaneil Dolin MD;  [facility];  Service: Endoscopy;  Laterality: N/A;  730    CORONARY ATHERECTOMY N/A 10/05/2020   Procedure: CORONARY ATHERECTOMY;  Surgeon: KTroy Sine MD;  [facility];  Service: Cardiovascular;  Laterality: N/A;   ESOPHAGOGASTRODUODENOSCOPY N/A 01/10/2014   Dr. Gala Romney: antral erosions, path with chronic inactive gastritis. Empiric dilation with 56-F dilation   ESOPHAGOGASTRODUODENOSCOPY (EGD) WITH PROPOFOL N/A 02/16/2016   Dr. Gala Romney: small hiatal hernia, normal esopagus s/p empiric dilation   ESOPHAGOGASTRODUODENOSCOPY (EGD) WITH PROPOFOL N/A 02/19/2018   Procedure: ESOPHAGOGASTRODUODENOSCOPY (EGD) WITH PROPOFOL;  Surgeon: Daneil Dolin, MD;  [facility];  Service: Endoscopy;  Laterality: N/A;  10:15am   KNEE ARTHROSCOPY Right 2002   LEFT HEART CATH N/A 08/18/2011   Procedure: LEFT  HEART CATH;  Surgeon: Lorretta Harp, MD;  [facility];  Service: Cardiovascular;  Laterality: N/A;   LEFT HEART CATH AND CORONARY ANGIOGRAPHY N/A 10/04/2020   Procedure: LEFT HEART CATH AND CORONARY ANGIOGRAPHY;  Surgeon: Troy Sine, MD;  Location: [place] [facility];  Service: Cardiovascular;  Laterality: N/A;   LIVER BIOPSY N/A 04/01/2014   mild fibrosis   MALONEY DILATION N/A 01/10/2014   Procedure: Venia Minks DILATION;  Surgeon: Daneil Dolin, MD;  [facility];  Service: Endoscopy;  Laterality: N/A;   MALONEY DILATION N/A 02/16/2016   Procedure: Venia Minks DILATION;  Surgeon: Daneil Dolin, MD;  [facility];  Service: Endoscopy;  Laterality: N/A;   MALONEY DILATION N/A 02/19/2018   Procedure: Venia Minks DILATION;  Surgeon: Daneil Dolin, MD;  [facility];  Service: Endoscopy;  Laterality: N/A;   POLYPECTOMY  08/22/2016   Procedure: POLYPECTOMY;  Surgeon: Daneil Dolin, MD;  [facility];  Service: Endoscopy;;   SAVORY DILATION N/A 01/10/2014   Procedure: Azzie Almas DILATION;  Surgeon: Daneil Dolin, MD;  [facility];  Service: Endoscopy;  Laterality: N/A;   TEMPORARY PACEMAKER N/A 10/05/2020   Procedure: TEMPORARY PACEMAKER;  Surgeon: Troy Sine, MD;  Location: [place] [facility];  Service: Cardiovascular;  Laterality: N/A;    Current Outpatient Medications  Medication Sig Dispense Refill   amLODipine (NORVASC) 5 MG tablet Take 1 tablet by mouth daily.     Ascorbic Acid (VITAMIN C) 1000 MG tablet Take 1,000 mg by mouth in the morning and at bedtime.     aspirin EC 81 MG tablet Take 1 tablet (81 mg total) by mouth daily.     atorvastatin (LIPITOR) 40 MG tablet Take 1 tablet (40 mg total) by mouth daily. 90 tablet 3   carvedilol (COREG) 3.125 MG tablet Take 1 tablet (3.125 mg total) by mouth 2 (two) times daily with a meal. 60 tablet 11   cholecalciferol (VITAMIN D3) 25 MCG (1000 UNIT) tablet Take 1,000 Units by mouth  daily.     clopidogrel (PLAVIX) 75 MG tablet Take 1 tablet (75 mg total) by mouth daily. 90 tablet 3   dapagliflozin propanediol (FARXIGA) 10 MG TABS tablet Take 1 tablet (10 mg total) by mouth daily before breakfast. 30 tablet 11   Dulaglutide 1.5 MG/0.5ML SOPN Inject 1.5 mg into the skin every Sunday.     fluticasone (FLONASE) 50 MCG/ACT nasal spray Place 1 spray into both nostrils daily as needed for allergies or rhinitis.     isosorbide mononitrate (IMDUR) 60 MG 24 hr tablet TAKE ONE TABLET BY MOUTH EVERY DAY PATIENT NEEDS OFFICE VISIT (Patient taking differently: Take 60 mg by mouth daily.) 7 tablet 0   loratadine-pseudoephedrine (CLARITIN-D 24-HOUR) 10-240 MG 24 hr tablet Take 1 tablet by mouth daily as needed for allergies.     Magnesium 500 MG TABS Take  500 mg by mouth daily as needed (for leg cramps).      metFORMIN (GLUCOPHAGE) 500 MG tablet Take 500 mg by mouth 2 (two) times daily.      nitroGLYCERIN (NITROSTAT) 0.4 MG SL tablet Place 1 tablet (0.4 mg total) under the tongue every 5 (five) minutes as needed for chest pain. 25 tablet 3   pantoprazole (PROTONIX) 40 MG tablet Take 1 tablet (40 mg total) by mouth 2 (two) times daily before a meal. 60 tablet 11   losartan (COZAAR) 50 MG tablet TAKE 1 TABLET DAILY 90 tablet 3   No current facility-administered medications for this visit.   Allergies:  Codeine, Diamox [acetazolamide], and Sulfa antibiotics   Social History: The patient  reports that he quit smoking about 25 years ago. His smoking use included cigarettes. He has a 22.50 pack-year smoking history. He has never used smokeless tobacco. He reports that he does not drink alcohol and does not use drugs.   Family History: The patient's family history includes CAD in an other family member; Diabetes type II in his sister; Heart attack in his cousin.   ROS:  Please see the history of present illness. Otherwise, complete review of systems is positive for none.  All other systems are  reviewed and negative.   Physical Exam: VS:  BP 124/78   Pulse 72   Ht 6' 3"  (1.905 m)   Wt 274 lb 3.2 oz (124.4 kg)   SpO2 98%   BMI 34.27 kg/m , BMI Body mass index is 34.27 kg/m.  Wt Readings from Last 3 Encounters:  04/27/21 274 lb 3.2 oz (124.4 kg)  10/26/20 295 lb (133.8 kg)  10/09/20 294 lb 11.2 oz (133.7 kg)    General: Patient appears comfortable at rest. Neck: Supple, no elevated JVP or carotid bruits, no thyromegaly. Lungs: Clear to auscultation, nonlabored breathing at rest. Cardiac: Regular rate and rhythm, no S3 or significant systolic murmur, no pericardial rub. Extremities: No pitting edema, distal pulses 2+. Skin: Warm and dry. Musculoskeletal: No kyphosis. Neuropsychiatric: Alert and oriented x3, affect grossly appropriate.  ECG:    Recent Labwork: 10/08/2020: ALT 25; AST 38; B Natriuretic Peptide 24.1 10/09/2020: BUN 16; Creatinine, Ser 1.04; Hemoglobin 14.7; Platelets 261; Potassium 3.3; Sodium 136     Component Value Date/Time   CHOL 118 10/06/2020 0352   TRIG 205 (H) 10/06/2020 0352   HDL 31 (L) 10/06/2020 0352   CHOLHDL 3.8 10/06/2020 0352   VLDL 41 (H) 10/06/2020 0352   LDLCALC 46 10/06/2020 0352    Other Studies Reviewed Today:   Cardiac catheterization 10/04/2020: Colon Flattery Cx to Prox Cx lesion is 99% stenosed. Prox Cx to Mid Cx lesion is 100% stenosed. Ost LM lesion is 30% stenosed. Mid LM to Dist LM lesion is 30% stenosed. Mid RCA lesion is 85% stenosed. Prox RCA-1 lesion is 50% stenosed. Prox RCA-2 lesion is 60% stenosed. Prox LAD lesion is 55% stenosed. RPDA lesion is 80% stenosed. The left ventricular systolic function is normal. The left ventricular ejection fraction is 50-55% by visual estimate. LV end diastolic pressure is low.   Multivessel CAD with smooth ostial narrowing of the left main and smooth distal left main stenosis prior to trifurcating into an LAD, ramus immediate vessel and high marginal vessel.   The LAD has very  proximal 50 to 60% irregularity initiating just beyond the ostium.   The ramus immediate vessel is normal.   The AV groove circumflex is totally occluded and arises from a  very high marginal or additional ramus-like vessel.   Large calcified dominant right coronary artery with superior takeoff and diffuse 50-60% proximal stenosis, 90% stenosis in a small RV branch followed by at least 80% assist with an apparent chunk of calcium in this stenosis.  There is mild luminal irregularity of the RCA and it appears that there is very diffuse distal PDA stenosis of approximately 80% in  a small caliber distal PDA.   Low normal global LV function with EF estimated 50 to 55%.  There is a small focal area of mid anterolateral hypocontractility most likely contributed by the AV groove circumflex occlusion.  LVEDP 7 mm Hg.   RECOMMENDATION: Angiograms will be reviewed with colleagues.  The RCA is diffusely diseased and appears to have significant calcification with very focal 85% calcified mid stenosis.  We will hydrate the patient following his catheterization which required increased contrast load secondary to difficulty in engaging his coronary arteries.  Tentative plan will be for atherectomy/PCI to the RCA tomorrow. Diagnostic Dominance: Right         Echocardiogram 10/04/2020:  1. Left ventricular ejection fraction, by estimation, is 50 to 55%. The  left ventricle has low normal function. The left ventricle has no regional  wall motion abnormalities. There is mild concentric left ventricular  hypertrophy. Left ventricular  diastolic parameters are consistent with Grade II diastolic dysfunction  (pseudonormalization).   2. Right ventricular systolic function is normal. The right ventricular  size is normal. Tricuspid regurgitation signal is inadequate for assessing  PA pressure.   3. The mitral valve is normal in structure. Trivial mitral valve  regurgitation.   4. The aortic valve is  tricuspid. Aortic valve regurgitation is trivial.  No aortic stenosis is present.    PCI 10/05/2020: Ost LM lesion is 30% stenosed. Mid LM to Dist LM lesion is 30% stenosed. Prox LAD lesion is 55% stenosed. Ost Cx to Prox Cx lesion is 99% stenosed. Prox Cx to Mid Cx lesion is 100% stenosed. Mid RCA lesion is 85% stenosed. Prox RCA-1 lesion is 50% stenosed. Prox RCA-2 lesion is 60% stenosed. A stent was successfully placed. Post intervention, there is a 0% residual stenosis. Post intervention, there is a 0% residual stenosis. Post intervention, there is a 0% residual stenosis.   Difficult but successful percutaneous coronary intervention of a diffusely calcified proximal to mid RCA with significant luminal calcification in the mid RCA with ultimate successful CSI coronary atherectomy, PTCA, and stenting with a 4.0 x 38 mm Resolute Onyx DES stent postdilated to 4.1 mm with the entire proximal to mid stenoses being reduced to 0%.  At the end of the procedure, there was brisk flow distally was now a normal-appearing distal PDA compared to the diagnostic study from yesterday.   Temporary transvenous pacemaker insertion paced prior to atherectomy with utilization during the procedure.   RECOMMENDATION: DAPT for a minimum of a year.  Medical therapy for concomitant CAD with total occlusion of the AV groove circumflex, 30% ostial and distal left main stenoses, and 50 to 60% proximal LAD stenoses.  Optimal blood pressure control, lipid management, and weight loss.  Diagnostic Dominance: Right Intervention    Assessment and Plan:  1. CAD in native artery   2. Essential hypertension   3. Mixed hyperlipidemia    1. CAD in native artery Denies any anginal or exertional symptoms.  He is compliant with all of his medications.  He states he would like to come off of his antiplatelet medication  due to easy bruising and feeling cold since starting Plavix and aspirin together.  He states he bleeds a  fair amount when he cuts himself shaving.  I informed him I would forward this to Dr. Domenic Polite to get his input on stopping Plavix or aspirin earlier than recommended.  Advised him to continue current medications for now continue aspirin 81 mg, Plavix 75 mg daily.  Continue Coreg 3.125 mg p.o. twice daily.  Continue Imdur 60 mg daily.  Continue nitroglycerin as needed.  Continue Farxiga 10 mg daily.  2. Essential hypertension Blood pressure well controlled today at 124/78.  Continue losartan 50 mg daily.  Continue amlodipine 5 mg daily.  3. Mixed hyperlipidemia Continue Atorvastatin 40 mg po daily.

## 2021-04-27 ENCOUNTER — Other Ambulatory Visit: Payer: Self-pay

## 2021-04-27 ENCOUNTER — Ambulatory Visit: Payer: BC Managed Care – PPO | Admitting: Family Medicine

## 2021-04-27 ENCOUNTER — Encounter: Payer: Self-pay | Admitting: Family Medicine

## 2021-04-27 VITALS — BP 124/78 | HR 72 | Ht 75.0 in | Wt 274.2 lb

## 2021-04-27 DIAGNOSIS — I2510 Atherosclerotic heart disease of native coronary artery without angina pectoris: Secondary | ICD-10-CM | POA: Diagnosis not present

## 2021-04-27 DIAGNOSIS — E782 Mixed hyperlipidemia: Secondary | ICD-10-CM | POA: Diagnosis not present

## 2021-04-27 DIAGNOSIS — I10 Essential (primary) hypertension: Secondary | ICD-10-CM | POA: Diagnosis not present

## 2021-04-27 MED ORDER — LOSARTAN POTASSIUM 50 MG PO TABS: ORAL_TABLET | ORAL | 3 refills | Status: DC

## 2021-04-27 NOTE — Patient Instructions (Addendum)

## 2021-04-29 ENCOUNTER — Encounter: Payer: Self-pay | Admitting: Family Medicine

## 2021-04-30 NOTE — Progress Notes (Signed)
Left message to return call 

## 2021-05-01 ENCOUNTER — Telehealth: Payer: Self-pay | Admitting: Cardiology

## 2021-05-01 NOTE — Telephone Encounter (Signed)
Per [place]-He should not discontinue aspirin or Plavix early as this is associated with an increased risk of acute stent thrombosis which could result in an acute heart attack or even death.  His complex PCI with DES intervention was in April and the interventionalist's report clearly indicated at least 1 year of DAPT (which would be through April 2023).  I would not recommend stopping therapy.    Patient informed and verbalized understanding of plan.

## 2021-05-01 NOTE — Telephone Encounter (Signed)
Correction to patient question-patient is awaiting response from provider about stopping plavix.

## 2021-05-01 NOTE — Telephone Encounter (Signed)
Patient states he is returning a call regarding results, but I don't see anything documented. Please advise.

## 2021-07-03 ENCOUNTER — Encounter (HOSPITAL_COMMUNITY): Payer: Self-pay | Admitting: *Deleted

## 2021-07-03 ENCOUNTER — Inpatient Hospital Stay (HOSPITAL_COMMUNITY)
Admission: EM | Admit: 2021-07-03 | Discharge: 2021-07-06 | DRG: 389 | Disposition: A | Discharge status: Home / Self Care | Payer: BC Managed Care – PPO | Attending: Internal Medicine | Admitting: Internal Medicine

## 2021-07-03 ENCOUNTER — Encounter: Payer: Self-pay | Admitting: Gastroenterology

## 2021-07-03 ENCOUNTER — Other Ambulatory Visit: Payer: Self-pay

## 2021-07-03 ENCOUNTER — Emergency Department (HOSPITAL_COMMUNITY)
Admission: EM | Admit: 2021-07-03 | Discharge: 2021-07-03 | Disposition: A | Discharge status: Home / Self Care | Payer: BC Managed Care – PPO | Source: Home / Self Care | Attending: Emergency Medicine | Admitting: Emergency Medicine

## 2021-07-03 ENCOUNTER — Emergency Department (HOSPITAL_COMMUNITY): Payer: BC Managed Care – PPO

## 2021-07-03 ENCOUNTER — Encounter (HOSPITAL_COMMUNITY): Payer: Self-pay | Admitting: Emergency Medicine

## 2021-07-03 ENCOUNTER — Telehealth: Payer: Self-pay | Admitting: Internal Medicine

## 2021-07-03 ENCOUNTER — Ambulatory Visit: Payer: BC Managed Care – PPO | Admitting: Gastroenterology

## 2021-07-03 DIAGNOSIS — Z888 Allergy status to other drugs, medicaments and biological substances status: Secondary | ICD-10-CM

## 2021-07-03 DIAGNOSIS — I10 Essential (primary) hypertension: Secondary | ICD-10-CM | POA: Insufficient documentation

## 2021-07-03 DIAGNOSIS — K269 Duodenal ulcer, unspecified as acute or chronic, without hemorrhage or perforation: Secondary | ICD-10-CM | POA: Diagnosis present

## 2021-07-03 DIAGNOSIS — E119 Type 2 diabetes mellitus without complications: Secondary | ICD-10-CM | POA: Insufficient documentation

## 2021-07-03 DIAGNOSIS — Z79899 Other long term (current) drug therapy: Secondary | ICD-10-CM | POA: Insufficient documentation

## 2021-07-03 DIAGNOSIS — I2510 Atherosclerotic heart disease of native coronary artery without angina pectoris: Secondary | ICD-10-CM | POA: Insufficient documentation

## 2021-07-03 DIAGNOSIS — E669 Obesity, unspecified: Secondary | ICD-10-CM | POA: Diagnosis present

## 2021-07-03 DIAGNOSIS — M1990 Unspecified osteoarthritis, unspecified site: Secondary | ICD-10-CM | POA: Diagnosis present

## 2021-07-03 DIAGNOSIS — Z7985 Long-term (current) use of injectable non-insulin antidiabetic drugs: Secondary | ICD-10-CM

## 2021-07-03 DIAGNOSIS — R1319 Other dysphagia: Secondary | ICD-10-CM | POA: Diagnosis not present

## 2021-07-03 DIAGNOSIS — R110 Nausea: Secondary | ICD-10-CM | POA: Diagnosis not present

## 2021-07-03 DIAGNOSIS — K7581 Nonalcoholic steatohepatitis (NASH): Secondary | ICD-10-CM | POA: Diagnosis present

## 2021-07-03 DIAGNOSIS — Z7982 Long term (current) use of aspirin: Secondary | ICD-10-CM

## 2021-07-03 DIAGNOSIS — Z7902 Long term (current) use of antithrombotics/antiplatelets: Secondary | ICD-10-CM | POA: Diagnosis not present

## 2021-07-03 DIAGNOSIS — Z885 Allergy status to narcotic agent status: Secondary | ICD-10-CM | POA: Diagnosis not present

## 2021-07-03 DIAGNOSIS — Z20822 Contact with and (suspected) exposure to covid-19: Secondary | ICD-10-CM | POA: Diagnosis present

## 2021-07-03 DIAGNOSIS — Z882 Allergy status to sulfonamides status: Secondary | ICD-10-CM | POA: Diagnosis not present

## 2021-07-03 DIAGNOSIS — R1010 Upper abdominal pain, unspecified: Secondary | ICD-10-CM | POA: Diagnosis not present

## 2021-07-03 DIAGNOSIS — R1314 Dysphagia, pharyngoesophageal phase: Secondary | ICD-10-CM | POA: Diagnosis present

## 2021-07-03 DIAGNOSIS — K449 Diaphragmatic hernia without obstruction or gangrene: Secondary | ICD-10-CM | POA: Diagnosis present

## 2021-07-03 DIAGNOSIS — G894 Chronic pain syndrome: Secondary | ICD-10-CM | POA: Diagnosis present

## 2021-07-03 DIAGNOSIS — K56609 Unspecified intestinal obstruction, unspecified as to partial versus complete obstruction: Secondary | ICD-10-CM | POA: Diagnosis present

## 2021-07-03 DIAGNOSIS — Z9049 Acquired absence of other specified parts of digestive tract: Secondary | ICD-10-CM

## 2021-07-03 DIAGNOSIS — K2210 Ulcer of esophagus without bleeding: Secondary | ICD-10-CM | POA: Diagnosis present

## 2021-07-03 DIAGNOSIS — R109 Unspecified abdominal pain: Secondary | ICD-10-CM | POA: Insufficient documentation

## 2021-07-03 DIAGNOSIS — K219 Gastro-esophageal reflux disease without esophagitis: Secondary | ICD-10-CM | POA: Diagnosis present

## 2021-07-03 DIAGNOSIS — Z7984 Long term (current) use of oral hypoglycemic drugs: Secondary | ICD-10-CM | POA: Insufficient documentation

## 2021-07-03 DIAGNOSIS — K529 Noninfective gastroenteritis and colitis, unspecified: Secondary | ICD-10-CM

## 2021-07-03 DIAGNOSIS — Z7951 Long term (current) use of inhaled steroids: Secondary | ICD-10-CM | POA: Insufficient documentation

## 2021-07-03 DIAGNOSIS — Z87891 Personal history of nicotine dependence: Secondary | ICD-10-CM

## 2021-07-03 DIAGNOSIS — K56600 Partial intestinal obstruction, unspecified as to cause: Principal | ICD-10-CM | POA: Diagnosis present

## 2021-07-03 DIAGNOSIS — Z8249 Family history of ischemic heart disease and other diseases of the circulatory system: Secondary | ICD-10-CM

## 2021-07-03 DIAGNOSIS — R1013 Epigastric pain: Secondary | ICD-10-CM | POA: Diagnosis not present

## 2021-07-03 DIAGNOSIS — Z6832 Body mass index (BMI) 32.0-32.9, adult: Secondary | ICD-10-CM

## 2021-07-03 DIAGNOSIS — R112 Nausea with vomiting, unspecified: Secondary | ICD-10-CM

## 2021-07-03 DIAGNOSIS — E785 Hyperlipidemia, unspecified: Secondary | ICD-10-CM | POA: Diagnosis present

## 2021-07-03 DIAGNOSIS — K5000 Crohn's disease of small intestine without complications: Secondary | ICD-10-CM | POA: Diagnosis present

## 2021-07-03 DIAGNOSIS — E1142 Type 2 diabetes mellitus with diabetic polyneuropathy: Secondary | ICD-10-CM | POA: Diagnosis not present

## 2021-07-03 LAB — CBC WITH DIFFERENTIAL/PLATELET
Abs Immature Granulocytes: 0.02 10*3/uL (ref 0.00–0.07)
Abs Immature Granulocytes: 0.04 10*3/uL (ref 0.00–0.07)
Basophils Absolute: 0.1 10*3/uL (ref 0.0–0.1)
Basophils Absolute: 0.1 10*3/uL (ref 0.0–0.1)
Basophils Relative: 1 %
Basophils Relative: 1 %
Eosinophils Absolute: 0.2 10*3/uL (ref 0.0–0.5)
Eosinophils Absolute: 0.1 10*3/uL (ref 0.0–0.5)
Eosinophils Relative: 2 %
Eosinophils Relative: 1 %
HCT: 48.7 % (ref 39.0–52.0)
HCT: 48.6 % (ref 39.0–52.0)
Hemoglobin: 17.4 g/dL — ABNORMAL HIGH (ref 13.0–17.0)
Hemoglobin: 17.1 g/dL — ABNORMAL HIGH (ref 13.0–17.0)
Immature Granulocytes: 0 %
Immature Granulocytes: 1 %
Lymphocytes Relative: 7 %
Lymphocytes Relative: 10 %
Lymphs Abs: 0.7 10*3/uL (ref 0.7–4.0)
Lymphs Abs: 0.9 10*3/uL (ref 0.7–4.0)
MCH: 32.8 pg (ref 26.0–34.0)
MCH: 32 pg (ref 26.0–34.0)
MCHC: 35.7 g/dL (ref 30.0–36.0)
MCHC: 35.2 g/dL (ref 30.0–36.0)
MCV: 91.7 fL (ref 80.0–100.0)
MCV: 91 fL (ref 80.0–100.0)
Monocytes Absolute: 0.7 10*3/uL (ref 0.1–1.0)
Monocytes Absolute: 0.8 10*3/uL (ref 0.1–1.0)
Monocytes Relative: 7 %
Monocytes Relative: 9 %
Neutro Abs: 8.7 10*3/uL — ABNORMAL HIGH (ref 1.7–7.7)
Neutro Abs: 7.1 10*3/uL (ref 1.7–7.7)
Neutrophils Relative %: 83 %
Neutrophils Relative %: 78 %
Platelets: 232 10*3/uL (ref 150–400)
Platelets: 231 10*3/uL (ref 150–400)
RBC: 5.31 MIL/uL (ref 4.22–5.81)
RBC: 5.34 MIL/uL (ref 4.22–5.81)
RDW: 12.1 % (ref 11.5–15.5)
RDW: 12 % (ref 11.5–15.5)
WBC: 10.3 10*3/uL (ref 4.0–10.5)
WBC: 8.9 10*3/uL (ref 4.0–10.5)
nRBC: 0 % (ref 0.0–0.2)
nRBC: 0 % (ref 0.0–0.2)

## 2021-07-03 LAB — COMPREHENSIVE METABOLIC PANEL
ALT: 27 U/L (ref 0–44)
ALT: 32 U/L (ref 0–44)
AST: 55 U/L — ABNORMAL HIGH (ref 15–41)
AST: 55 U/L — ABNORMAL HIGH (ref 15–41)
Albumin: 4.4 g/dL (ref 3.5–5.0)
Albumin: 4.6 g/dL (ref 3.5–5.0)
Alkaline Phosphatase: 66 U/L (ref 38–126)
Alkaline Phosphatase: 67 U/L (ref 38–126)
Anion gap: 9 (ref 5–15)
Anion gap: 11 (ref 5–15)
BUN: 17 mg/dL (ref 6–20)
BUN: 18 mg/dL (ref 6–20)
CO2: 20 mmol/L — ABNORMAL LOW (ref 22–32)
CO2: 21 mmol/L — ABNORMAL LOW (ref 22–32)
Calcium: 8.8 mg/dL — ABNORMAL LOW (ref 8.9–10.3)
Calcium: 8.8 mg/dL — ABNORMAL LOW (ref 8.9–10.3)
Chloride: 108 mmol/L (ref 98–111)
Chloride: 106 mmol/L (ref 98–111)
Creatinine, Ser: 0.91 mg/dL (ref 0.61–1.24)
Creatinine, Ser: 1.01 mg/dL (ref 0.61–1.24)
GFR, Estimated: >60 mL/min (ref >60)
GFR, Estimated: >60 mL/min (ref >60)
Glucose, Bld: 172 mg/dL — ABNORMAL HIGH (ref 70–99)
Glucose, Bld: 157 mg/dL — ABNORMAL HIGH (ref 70–99)
Potassium: 4.2 mmol/L (ref 3.5–5.1)
Potassium: 3.9 mmol/L (ref 3.5–5.1)
Sodium: 137 mmol/L (ref 135–145)
Sodium: 138 mmol/L (ref 135–145)
Total Bilirubin: 1.1 mg/dL (ref 0.3–1.2)
Total Bilirubin: 1.3 mg/dL — ABNORMAL HIGH (ref 0.3–1.2)
Total Protein: 7.4 g/dL (ref 6.5–8.1)
Total Protein: 7.3 g/dL (ref 6.5–8.1)

## 2021-07-03 LAB — HEMOGLOBIN A1C
Hgb A1c MFr Bld: 6.4 % — ABNORMAL HIGH (ref 4.8–5.6)
Mean Plasma Glucose: 136.98 mg/dL

## 2021-07-03 LAB — GLUCOSE, CAPILLARY
Glucose-Capillary: 130 mg/dL — ABNORMAL HIGH (ref 70–99)
Glucose-Capillary: 112 mg/dL — ABNORMAL HIGH (ref 70–99)

## 2021-07-03 LAB — RESP PANEL BY RT-PCR (FLU A&B, COVID) ARPGX2
Influenza A by PCR: NEGATIVE
Influenza B by PCR: NEGATIVE
SARS Coronavirus 2 by RT PCR: NEGATIVE

## 2021-07-03 LAB — LIPASE, BLOOD: Lipase: 38 U/L (ref 11–51)

## 2021-07-03 MED ORDER — ACETAMINOPHEN 650 MG RE SUPP: 650.0000 mg | Freq: Four times a day (QID) | RECTAL | Status: DC | PRN

## 2021-07-03 MED ORDER — ONDANSETRON HCL 4 MG PO TABS: 4.0000 mg | ORAL_TABLET | Freq: Four times a day (QID) | ORAL | Status: DC | PRN

## 2021-07-03 MED ORDER — METOCLOPRAMIDE HCL 5 MG/ML IJ SOLN
5.0000 mg | Freq: Three times a day (TID) | INTRAMUSCULAR | Status: DC
  Administered 2021-07-03 – 2021-07-06 (×9): 5 mg via INTRAVENOUS
  Filled 2021-07-03 (×9): qty 2

## 2021-07-03 MED ORDER — LIDOCAINE VISCOUS HCL 2 % MT SOLN
15.0000 mL | Freq: Once | OROMUCOSAL | Status: AC
  Administered 2021-07-03: 15 mL via ORAL
  Filled 2021-07-03: qty 15

## 2021-07-03 MED ORDER — INSULIN ASPART 100 UNIT/ML IJ SOLN: 0.0000 [IU] | INTRAMUSCULAR | Status: DC

## 2021-07-03 MED ORDER — METRONIDAZOLE 500 MG/100ML IV SOLN
500.0000 mg | Freq: Two times a day (BID) | INTRAVENOUS | Status: DC
  Administered 2021-07-03 – 2021-07-06 (×6): 500 mg via INTRAVENOUS
  Filled 2021-07-03 (×6): qty 100

## 2021-07-03 MED ORDER — OXYCODONE HCL 5 MG PO TABS: 5.0000 mg | ORAL_TABLET | Freq: Four times a day (QID) | ORAL | Status: DC | PRN

## 2021-07-03 MED ORDER — ACETAMINOPHEN 325 MG PO TABS: 650.0000 mg | ORAL_TABLET | Freq: Four times a day (QID) | ORAL | Status: DC | PRN

## 2021-07-03 MED ORDER — ATORVASTATIN CALCIUM 40 MG PO TABS
40.0000 mg | ORAL_TABLET | Freq: Every evening | ORAL | Status: DC
  Administered 2021-07-04 – 2021-07-05 (×2): 40 mg via ORAL
  Filled 2021-07-03 (×2): qty 1

## 2021-07-03 MED ORDER — METOCLOPRAMIDE HCL 10 MG PO TABS: 10.0000 mg | ORAL_TABLET | Freq: Four times a day (QID) | ORAL | 0 refills | Status: DC | PRN

## 2021-07-03 MED ORDER — TRAZODONE HCL 50 MG PO TABS
25.0000 mg | ORAL_TABLET | Freq: Every evening | ORAL | Status: DC | PRN
  Administered 2021-07-03 – 2021-07-05 (×3): 25 mg via ORAL
  Filled 2021-07-03 (×3): qty 1

## 2021-07-03 MED ORDER — INSULIN ASPART 100 UNIT/ML IJ SOLN: 0.0000 [IU] | Freq: Three times a day (TID) | INTRAMUSCULAR | Status: DC

## 2021-07-03 MED ORDER — IOHEXOL 300 MG/ML  SOLN
100.0000 mL | Freq: Once | INTRAMUSCULAR | Status: AC | PRN
  Administered 2021-07-03: 100 mL via INTRAVENOUS

## 2021-07-03 MED ORDER — HEPARIN SODIUM (PORCINE) 5000 UNIT/ML IJ SOLN
5000.0000 [IU] | Freq: Three times a day (TID) | INTRAMUSCULAR | Status: DC
  Administered 2021-07-03 – 2021-07-05 (×5): 5000 [IU] via SUBCUTANEOUS
  Filled 2021-07-03 (×6): qty 1

## 2021-07-03 MED ORDER — FENTANYL CITRATE PF 50 MCG/ML IJ SOSY
50.0000 ug | PREFILLED_SYRINGE | Freq: Once | INTRAMUSCULAR | Status: AC
  Administered 2021-07-03: 50 ug via INTRAVENOUS
  Filled 2021-07-03: qty 1

## 2021-07-03 MED ORDER — ASPIRIN EC 81 MG PO TBEC
81.0000 mg | DELAYED_RELEASE_TABLET | Freq: Every day | ORAL | Status: DC
  Administered 2021-07-04 – 2021-07-06 (×2): 81 mg via ORAL
  Filled 2021-07-03 (×3): qty 1

## 2021-07-03 MED ORDER — NITROGLYCERIN 0.4 MG SL SUBL: 0.4000 mg | SUBLINGUAL_TABLET | SUBLINGUAL | Status: DC | PRN

## 2021-07-03 MED ORDER — SODIUM CHLORIDE 0.9 % IV SOLN: INTRAVENOUS | Status: DC

## 2021-07-03 MED ORDER — AMLODIPINE BESYLATE 5 MG PO TABS
5.0000 mg | ORAL_TABLET | Freq: Every day | ORAL | Status: DC
  Administered 2021-07-04 – 2021-07-06 (×2): 5 mg via ORAL
  Filled 2021-07-03 (×3): qty 1

## 2021-07-03 MED ORDER — CIPROFLOXACIN IN D5W 400 MG/200ML IV SOLN
400.0000 mg | Freq: Two times a day (BID) | INTRAVENOUS | Status: DC
  Administered 2021-07-03 – 2021-07-06 (×6): 400 mg via INTRAVENOUS
  Filled 2021-07-03 (×6): qty 200

## 2021-07-03 MED ORDER — CARVEDILOL 3.125 MG PO TABS
3.1250 mg | ORAL_TABLET | Freq: Two times a day (BID) | ORAL | Status: DC
  Administered 2021-07-03 – 2021-07-05 (×3): 3.125 mg via ORAL
  Filled 2021-07-03 (×6): qty 1

## 2021-07-03 MED ORDER — ONDANSETRON HCL 4 MG/2ML IJ SOLN: 4.0000 mg | Freq: Four times a day (QID) | INTRAMUSCULAR | Status: DC | PRN

## 2021-07-03 MED ORDER — FENTANYL CITRATE PF 50 MCG/ML IJ SOSY: 12.5000 ug | PREFILLED_SYRINGE | INTRAMUSCULAR | Status: DC | PRN

## 2021-07-03 MED ORDER — CLOPIDOGREL BISULFATE 75 MG PO TABS
75.0000 mg | ORAL_TABLET | Freq: Every day | ORAL | Status: DC
  Administered 2021-07-04 – 2021-07-06 (×2): 75 mg via ORAL
  Filled 2021-07-03 (×3): qty 1

## 2021-07-03 MED ORDER — SODIUM CHLORIDE 0.9 % IV BOLUS
500.0000 mL | Freq: Once | INTRAVENOUS | Status: AC
  Administered 2021-07-03: 500 mL via INTRAVENOUS

## 2021-07-03 MED ORDER — PANTOPRAZOLE SODIUM 40 MG IV SOLR
40.0000 mg | Freq: Two times a day (BID) | INTRAVENOUS | Status: DC
  Administered 2021-07-03 – 2021-07-06 (×6): 40 mg via INTRAVENOUS
  Filled 2021-07-03 (×6): qty 40

## 2021-07-03 MED ORDER — ALUM & MAG HYDROXIDE-SIMETH 200-200-20 MG/5ML PO SUSP
30.0000 mL | Freq: Once | ORAL | Status: AC
  Administered 2021-07-03: 30 mL via ORAL
  Filled 2021-07-03: qty 30

## 2021-07-03 MED ORDER — ONDANSETRON HCL 4 MG/2ML IJ SOLN
4.0000 mg | Freq: Once | INTRAMUSCULAR | Status: AC
  Administered 2021-07-03: 4 mg via INTRAVENOUS
  Filled 2021-07-03: qty 2

## 2021-07-03 MED ORDER — PANTOPRAZOLE SODIUM 40 MG IV SOLR
40.0000 mg | INTRAVENOUS | Status: DC
  Filled 2021-07-03: qty 40

## 2021-07-03 MED ORDER — ISOSORBIDE MONONITRATE ER 60 MG PO TB24
60.0000 mg | ORAL_TABLET | Freq: Every day | ORAL | Status: DC
  Administered 2021-07-04 – 2021-07-06 (×2): 60 mg via ORAL
  Filled 2021-07-03 (×3): qty 1

## 2021-07-03 NOTE — Telephone Encounter (Signed)
Spoke with pt and pt was seen in ER last night for abdominal pain and nausea. Pt feels like it could possibly be coming from the hernia that he has. Put pt in schedule to see Vicente Males today for ER follow up.

## 2021-07-03 NOTE — Telephone Encounter (Signed)
Pt said he was in the ER last night and is aware that he can't get scheduled until May. He asked to speak with the nurse. [phone]

## 2021-07-03 NOTE — Patient Instructions (Signed)
I have called the emergency room and let them know you are on the way.  I am sorry you are feeling so bad! I feel it would be best to be admitted for observation, so that you can have relief.   We have GI on call and can follow you if needed while in the hospital.  I enjoyed seeing you again today! As you know, I value our relationship and want to provide genuine, compassionate, and quality care. I welcome your feedback. If you receive a survey regarding your visit,  I greatly appreciate you taking time to fill this out. See you next time!  Annitta Needs, PhD, ANP-BC [facility]

## 2021-07-03 NOTE — Telephone Encounter (Signed)
Pt called to say that he needed advice from Dr Gala Romney. He said he was in the ER all night and is in severe pain and he wanted to be seen ASAP and is aware that we are booked in to May. Pt hasn't been seen since 2019. I have nothing to offer him ASAP. [phone]

## 2021-07-03 NOTE — ED Notes (Signed)
Attempted to call report X 1. 

## 2021-07-03 NOTE — ED Provider Notes (Signed)
[facility] EMERGENCY DEPARTMENT Provider Note   CSN: [id number] Arrival date & time: 07/03/21  1024     History  Chief Complaint  Patient presents with   Abdominal Pain    [name] is a 56 y.o. male.   Abdominal Pain Associated symptoms: nausea and vomiting   Associated symptoms: no chest pain, no chills, no dysuria, no fatigue, no fever, no shortness of breath and no sore throat        Jerome Shepherd is a 56 y.o. male with past medical history of coronary artery disease, reflux, diabetes and hypertension who returns to the Emergency Department for continued upper abdominal pain.  He was seen here earlier this morning for same.  He was seen by GI for follow-up after his ER visit this morning.  Sent back here for further evaluation and hospital admission for developing small bowel obstruction.  He reports having mid upper abdominal pain that is constant and worsens when he attempts to eat solid foods.  States that he feels as though his "food will not go through."  He typically only gets relief from taking a laxative.  He is had multiple EGDs and last colonoscopy was 2018.  He was given GI cocktail this morning which he vomited.  He denies any chest pain, back pain, shortness of breath, fever or chills.  Last solid food was banana sandwich for lunch yesterday and tomato soup around 7 PM.     Home Medications Prior to Admission medications   Medication Sig Start Date End Date Taking? Authorizing Provider  amLODipine (NORVASC) 5 MG tablet Take 1 tablet by mouth daily. 09/17/20   [provider]  Ascorbic Acid (VITAMIN C) 1000 MG tablet Take 1,000 mg by mouth in the morning and at bedtime.    [provider]  aspirin EC 81 MG tablet Take 1 tablet (81 mg total) by mouth daily. 11/23/15   Herminio Commons, MD  atorvastatin (LIPITOR) 40 MG tablet Take 1 tablet (40 mg total) by mouth daily. 10/06/20   Troy Sine, MD  carvedilol (COREG) 3.125 MG tablet Take 1  tablet (3.125 mg total) by mouth 2 (two) times daily with a meal. 10/06/20   Troy Sine, MD  cholecalciferol (VITAMIN D3) 25 MCG (1000 UNIT) tablet Take 1,000 Units by mouth daily.    [provider]  clopidogrel (PLAVIX) 75 MG tablet Take 1 tablet (75 mg total) by mouth daily. 10/10/20   Cheryln Manly, NP  dapagliflozin propanediol (FARXIGA) 10 MG TABS tablet Take 1 tablet (10 mg total) by mouth daily before breakfast. 10/06/20   Troy Sine, MD  Dulaglutide 1.5 MG/0.5ML SOPN Inject 1.5 mg into the skin every Sunday.    [provider]  fluticasone (FLONASE) 50 MCG/ACT nasal spray Place 1 spray into both nostrils daily as needed for allergies or rhinitis. Patient not taking: Reported on 07/03/2021    [provider]  isosorbide mononitrate (IMDUR) 60 MG 24 hr tablet TAKE ONE TABLET BY MOUTH EVERY DAY PATIENT NEEDS OFFICE VISIT Patient taking differently: Take 60 mg by mouth daily. 01/15/17   Herminio Commons, MD  loratadine-pseudoephedrine (CLARITIN-D 24-HOUR) 10-240 MG 24 hr tablet Take 1 tablet by mouth daily as needed for allergies.    [provider]  losartan (COZAAR) 50 MG tablet TAKE 1 TABLET DAILY 04/27/21   Verta Ellen., NP  Magnesium 500 MG TABS Take 500 mg by mouth daily as needed (for leg cramps).  [provider]  metFORMIN (GLUCOPHAGE) 500 MG tablet Take 500 mg by mouth 2 (two) times daily.     [provider]  metoCLOPramide (REGLAN) 10 MG tablet Take 1 tablet (10 mg total) by mouth every 6 (six) hours as needed for nausea. 07/03/21   Horton, Barbette Hair, MD  nitroGLYCERIN (NITROSTAT) 0.4 MG SL tablet Place 1 tablet (0.4 mg total) under the tongue every 5 (five) minutes as needed for chest pain. 07/18/15   Herminio Commons, MD  pantoprazole (PROTONIX) 40 MG tablet Take 1 tablet (40 mg total) by mouth 2 (two) times daily before a meal. 10/09/20   Cheryln Manly, NP      Allergies    Codeine, Diamox  [acetazolamide], and Sulfa antibiotics    Review of Systems   Review of Systems  Constitutional:  Negative for chills, fatigue and fever.  HENT:  Negative for sore throat and trouble swallowing.   Respiratory:  Negative for shortness of breath.   Cardiovascular:  Negative for chest pain.  Gastrointestinal:  Positive for abdominal pain, nausea and vomiting. Negative for blood in stool.  Genitourinary:  Negative for dysuria.  Musculoskeletal:  Negative for back pain and myalgias.  Skin:  Negative for rash.  Neurological:  Negative for dizziness, weakness and numbness.  Hematological:  Does not bruise/bleed easily.  All other systems reviewed and are negative.  Physical Exam Updated Vital Signs BP 126/77    Pulse 81    Temp 98.1 F (36.7 C) (Oral)    Resp 17    SpO2 92%  Physical Exam Vitals and nursing note reviewed.  Constitutional:      General: He is not in acute distress.    Appearance: He is well-developed.     Comments: Patient is uncomfortable appearing  HENT:     Mouth/Throat:     Mouth: Mucous membranes are moist.     Pharynx: Oropharynx is clear.  Cardiovascular:     Rate and Rhythm: Normal rate and regular rhythm.  Pulmonary:     Effort: Pulmonary effort is normal.     Breath sounds: Normal breath sounds.  Abdominal:     Palpations: Abdomen is soft. There is no mass.     Tenderness: There is abdominal tenderness in the epigastric area.     Comments: Epigastric tenderness on exam.  No guarding or rebound.  Abdomen does not appear distended  Musculoskeletal:        General: Normal range of motion.     Right lower leg: No edema.     Left lower leg: No edema.  Skin:    General: Skin is warm.     Capillary Refill: Capillary refill takes less than 2 seconds.  Neurological:     General: No focal deficit present.     Mental Status: He is alert.     Sensory: No sensory deficit.     Motor: No weakness.    ED Results / Procedures / Treatments   Labs (all labs  ordered are listed, but only abnormal results are displayed) Labs Reviewed  CBC WITH DIFFERENTIAL/PLATELET - Abnormal; Notable for the following components:      Result Value   Hemoglobin 17.1 (*)    All other components within normal limits  COMPREHENSIVE METABOLIC PANEL - Abnormal; Notable for the following components:   CO2 21 (*)    Glucose, Bld 157 (*)    Calcium 8.8 (*)    AST 55 (*)    Total Bilirubin 1.3 (*)  All other components within normal limits  RESP PANEL BY RT-PCR (FLU A&B, COVID) ARPGX2    EKG None  Radiology CT ABDOMEN PELVIS W CONTRAST  Result Date: 07/03/2021 CLINICAL DATA:  Abdominal pain and reflux. EXAM: CT ABDOMEN AND PELVIS WITH CONTRAST TECHNIQUE: Multidetector CT imaging of the abdomen and pelvis was performed using the standard protocol following bolus administration of intravenous contrast. CONTRAST:  123mL OMNIPAQUE IOHEXOL 300 MG/ML  SOLN COMPARISON:  CT without contrast 03/10/2016 FINDINGS: Lower chest: No acute abnormality. Calcifications are present in the LAD and right coronary arteries. The cardiac size is normal. Hepatobiliary: 23 cm length mildly steatotic liver, with improvement in the steatosis. There is no mass enhancement. Gallbladder is absent and there is no biliary dilatation. Pancreas: Unremarkable. No pancreatic ductal dilatation or surrounding inflammatory changes. Spleen: Enlarged measuring 15.9 cm in length, unchanged. No mass enhancement. Adrenals/Urinary Tract: No adrenal or renal cortical mass is seen. On the left there is asymmetric cortical thinning. There is no stone or hydronephrosis, no bladder thickening. Stomach/Bowel: There is mild gastric fluid distention. There are thickened folds in the left upper to mid abdominal small bowel with slightly dilated segments in the left mid to lower abdomen up to 2.9 cm. There are fluid filled normal caliber segments in the right abdomen with slight mucosal enhancement. A transitional segment is  not seen. The appendix is normal. The large intestine wall is unremarkable aside from uncomplicated sigmoid diverticula. Vascular/Lymphatic: There is moderate aortoiliac atherosclerosis. No AAA. No enlarged abdominal or pelvic lymph nodes. Reproductive: Normal prostate. Other: There are small umbilical and inguinal fat hernias. There is no free air, hemorrhage or fluid. Musculoskeletal: There are degenerative changes with endplate Schmorl's nodes in the lower thoracic spine. Facet hypertrophy lower lumbar spine with spondylosis. This includes advanced L4-5 facet hypertrophy with moderate to severe degenerative spinal stenosis and bilateral foraminal stenosis. No worrisome regional bone lesion. IMPRESSION: 1. Evidence of a nonspecific small bowel enteritis. Some segments in the left abdomen are slightly dilated which could indicate evidence of an ileus or a low-grade early small-bowel obstruction. A transitional segment could not be found and there is no mesenteric edema. 2. Uncomplicated sigmoid diverticula. 3. Asymmetric left renal volume loss.  No stones or hydronephrosis. 4. Aortic atherosclerosis. 5. Mild hepatic steatosis and prominence, mild splenomegaly. The liver is less steatotic than in 2017. 6. Coronary artery calcifications. 7. Acquired spinal stenosis L4-5 with foraminal stenosis. Electronically Signed   By: Telford Nab M.D.   On: 07/03/2021 04:33    Procedures Procedures    Medications Ordered in ED Medications  sodium chloride 0.9 % bolus 500 mL (has no administration in time range)  ondansetron (ZOFRAN) injection 4 mg (4 mg Intravenous Given 07/03/21 1145)  fentaNYL (SUBLIMAZE) injection 50 mcg (50 mcg Intravenous Given 07/03/21 1145)    ED Course/ Medical Decision Making/ A&P                           Medical Decision Making  Patient here with longstanding history of epigastric pain and "hiatal hernia."  Seen here this morning for same and had follow-up with GI this morning.  Sent  back to the emergency department for further evaluation and admission for likely developing small bowel obstruction.  On exam, patient is uncomfortable appearing.  Does not appear critically ill.  No active vomiting.  He does have localized epigastric tenderness on exam.  Vital signs reviewed by me.  I have reviewed  patient's medical record including history information from previous ER visit, notes from GI office, laboratory results and imaging results.  Plan today will include repeat of baseline labs including CBC and chemistries.  I do not feel that repeat imaging is warranted at this time.  CT imaging from this morning's visit does show possible developing SBO.  He will likely need hospital admission.  No active vomiting at this time.  Do not feel that NG tube is needed at this time.  Labs interpreted by me showed no evidence of leukocytosis.  No significant electrolyte derangement.  LFTs show mild elevation of AST otherwise unremarkable.  Lipase from this morning was reviewed and also unremarkable.  Discussed findings with [facility] hospitalist, Dr. Wynetta Emery who agrees to admit patient.        Final Clinical Impression(s) / ED Diagnoses Final diagnoses:  SBO (small bowel obstruction) [facility])    Rx / DC Orders ED Discharge Orders     None         Bufford Lope 07/03/21 2012    Teressa Lower, MD 07/06/21 [phone]

## 2021-07-03 NOTE — ED Provider Notes (Addendum)
[facility] EMERGENCY DEPARTMENT Provider Note   CSN: [id number] Arrival date & time: 07/03/21  0152     History  Chief Complaint  Patient presents with   Abdominal Pain    Jerome Shepherd is a 56 y.o. male.  HPI     This is a 56 year old male with a history of coronary artery disease, reflux, diabetes, hypertension who presents with abdominal pain.  Patient reports longstanding history of reflux and "I think my hiatal hernia is acting up."  Patient describes eating and feeling like everything he eats or drinks "will not go through."  He states he takes Gas-X and occasionally laxatives.  He gets some relief when he has a bowel movement.  He states persistent full sensation in the stomach and "acid back up."  Has taken Protonix with minimal relief.  No chest pain, shortness of breath, fevers.  Reports nausea without vomiting.  Home Medications Prior to Admission medications   Medication Sig Start Date End Date Taking? Authorizing Provider  metoCLOPramide (REGLAN) 10 MG tablet Take 1 tablet (10 mg total) by mouth every 6 (six) hours as needed for nausea. 07/03/21  Yes Gedeon Brandow, Barbette Hair, MD  amLODipine (NORVASC) 5 MG tablet Take 1 tablet by mouth daily. 09/17/20   [provider]  Ascorbic Acid (VITAMIN C) 1000 MG tablet Take 1,000 mg by mouth in the morning and at bedtime.    [provider]  aspirin EC 81 MG tablet Take 1 tablet (81 mg total) by mouth daily. 11/23/15   Herminio Commons, MD  atorvastatin (LIPITOR) 40 MG tablet Take 1 tablet (40 mg total) by mouth daily. 10/06/20   Troy Sine, MD  carvedilol (COREG) 3.125 MG tablet Take 1 tablet (3.125 mg total) by mouth 2 (two) times daily with a meal. 10/06/20   Troy Sine, MD  cholecalciferol (VITAMIN D3) 25 MCG (1000 UNIT) tablet Take 1,000 Units by mouth daily.    [provider]  clopidogrel (PLAVIX) 75 MG tablet Take 1 tablet (75 mg total) by mouth daily. 10/10/20   Cheryln Manly, NP   dapagliflozin propanediol (FARXIGA) 10 MG TABS tablet Take 1 tablet (10 mg total) by mouth daily before breakfast. 10/06/20   Troy Sine, MD  Dulaglutide 1.5 MG/0.5ML SOPN Inject 1.5 mg into the skin every Sunday.    [provider]  fluticasone (FLONASE) 50 MCG/ACT nasal spray Place 1 spray into both nostrils daily as needed for allergies or rhinitis.    [provider]  isosorbide mononitrate (IMDUR) 60 MG 24 hr tablet TAKE ONE TABLET BY MOUTH EVERY DAY PATIENT NEEDS OFFICE VISIT Patient taking differently: Take 60 mg by mouth daily. 01/15/17   Herminio Commons, MD  loratadine-pseudoephedrine (CLARITIN-D 24-HOUR) 10-240 MG 24 hr tablet Take 1 tablet by mouth daily as needed for allergies.    [provider]  losartan (COZAAR) 50 MG tablet TAKE 1 TABLET DAILY 04/27/21   Verta Ellen., NP  Magnesium 500 MG TABS Take 500 mg by mouth daily as needed (for leg cramps).     [provider]  metFORMIN (GLUCOPHAGE) 500 MG tablet Take 500 mg by mouth 2 (two) times daily.     [provider]  nitroGLYCERIN (NITROSTAT) 0.4 MG SL tablet Place 1 tablet (0.4 mg total) under the tongue every 5 (five) minutes as needed for chest pain. 07/18/15   Herminio Commons, MD  pantoprazole (PROTONIX) 40 MG tablet Take 1 tablet (40 mg total)  by mouth 2 (two) times daily before a meal. 10/09/20   Reino Bellis B, NP      Allergies    Codeine, Diamox [acetazolamide], and Sulfa antibiotics    Review of Systems   Review of Systems  Cardiovascular:  Negative for chest pain.  Gastrointestinal:  Positive for abdominal pain and nausea. Negative for constipation, diarrhea and vomiting.  All other systems reviewed and are negative.  Physical Exam Updated Vital Signs BP 139/89    Pulse 83    Temp 98.2 F (36.8 C) (Oral)    Resp (!) 9    Ht 1.905 m (6\' 3" )    Wt 117.9 kg    SpO2 97%    BMI 32.50 kg/m  Physical Exam Vitals and nursing note reviewed.   Constitutional:      Appearance: He is well-developed. He is obese. He is not ill-appearing.  HENT:     Head: Normocephalic and atraumatic.  Eyes:     Pupils: Pupils are equal, round, and reactive to light.  Cardiovascular:     Rate and Rhythm: Normal rate and regular rhythm.     Heart sounds: Normal heart sounds. No murmur heard. Pulmonary:     Effort: Pulmonary effort is normal. No respiratory distress.     Breath sounds: Normal breath sounds. No wheezing.  Abdominal:     General: Bowel sounds are normal.     Palpations: Abdomen is soft.     Tenderness: There is abdominal tenderness in the epigastric area. There is no guarding or rebound.  Musculoskeletal:     Cervical back: Neck supple.  Lymphadenopathy:     Cervical: No cervical adenopathy.  Skin:    General: Skin is warm and dry.  Neurological:     General: No focal deficit present.     Mental Status: He is alert and oriented to person, place, and time.    ED Results / Procedures / Treatments   Labs (all labs ordered are listed, but only abnormal results are displayed) Labs Reviewed  CBC WITH DIFFERENTIAL/PLATELET - Abnormal; Notable for the following components:      Result Value   Hemoglobin 17.4 (*)    Neutro Abs 8.7 (*)    All other components within normal limits  COMPREHENSIVE METABOLIC PANEL - Abnormal; Notable for the following components:   CO2 20 (*)    Glucose, Bld 172 (*)    Calcium 8.8 (*)    AST 55 (*)    All other components within normal limits  LIPASE, BLOOD    EKG EKG Interpretation  Date/Time:  Tuesday July 03 2021 02:27:10 EST Ventricular Rate:  88 PR Interval:  182 QRS Duration: 96 QT Interval:  365 QTC Calculation: 442 R Axis:   -19 Text Interpretation: Sinus rhythm Borderline left axis deviation Low voltage, precordial leads Anteroseptal infarct, old Baseline wander in lead(s) V2 Confirmed by Thayer Jew [phone]) on 07/03/2021 3:22:47 AM  Radiology CT ABDOMEN PELVIS W  CONTRAST  Result Date: 07/03/2021 CLINICAL DATA:  Abdominal pain and reflux. EXAM: CT ABDOMEN AND PELVIS WITH CONTRAST TECHNIQUE: Multidetector CT imaging of the abdomen and pelvis was performed using the standard protocol following bolus administration of intravenous contrast. CONTRAST:  179mL OMNIPAQUE IOHEXOL 300 MG/ML  SOLN COMPARISON:  CT without contrast 03/10/2016 FINDINGS: Lower chest: No acute abnormality. Calcifications are present in the LAD and right coronary arteries. The cardiac size is normal. Hepatobiliary: 23 cm length mildly steatotic liver, with improvement in the steatosis. There is no mass  enhancement. Gallbladder is absent and there is no biliary dilatation. Pancreas: Unremarkable. No pancreatic ductal dilatation or surrounding inflammatory changes. Spleen: Enlarged measuring 15.9 cm in length, unchanged. No mass enhancement. Adrenals/Urinary Tract: No adrenal or renal cortical mass is seen. On the left there is asymmetric cortical thinning. There is no stone or hydronephrosis, no bladder thickening. Stomach/Bowel: There is mild gastric fluid distention. There are thickened folds in the left upper to mid abdominal small bowel with slightly dilated segments in the left mid to lower abdomen up to 2.9 cm. There are fluid filled normal caliber segments in the right abdomen with slight mucosal enhancement. A transitional segment is not seen. The appendix is normal. The large intestine wall is unremarkable aside from uncomplicated sigmoid diverticula. Vascular/Lymphatic: There is moderate aortoiliac atherosclerosis. No AAA. No enlarged abdominal or pelvic lymph nodes. Reproductive: Normal prostate. Other: There are small umbilical and inguinal fat hernias. There is no free air, hemorrhage or fluid. Musculoskeletal: There are degenerative changes with endplate Schmorl's nodes in the lower thoracic spine. Facet hypertrophy lower lumbar spine with spondylosis. This includes advanced L4-5 facet  hypertrophy with moderate to severe degenerative spinal stenosis and bilateral foraminal stenosis. No worrisome regional bone lesion. IMPRESSION: 1. Evidence of a nonspecific small bowel enteritis. Some segments in the left abdomen are slightly dilated which could indicate evidence of an ileus or a low-grade early small-bowel obstruction. A transitional segment could not be found and there is no mesenteric edema. 2. Uncomplicated sigmoid diverticula. 3. Asymmetric left renal volume loss.  No stones or hydronephrosis. 4. Aortic atherosclerosis. 5. Mild hepatic steatosis and prominence, mild splenomegaly. The liver is less steatotic than in 2017. 6. Coronary artery calcifications. 7. Acquired spinal stenosis L4-5 with foraminal stenosis. Electronically Signed   By: Telford Nab M.D.   On: 07/03/2021 04:33    Procedures Procedures    Medications Ordered in ED Medications  alum & mag hydroxide-simeth (MAALOX/MYLANTA) 200-200-20 MG/5ML suspension 30 mL (30 mLs Oral Given 07/03/21 0218)    And  lidocaine (XYLOCAINE) 2 % viscous mouth solution 15 mL (15 mLs Oral Given 07/03/21 0218)  iohexol (OMNIPAQUE) 300 MG/ML solution 100 mL (100 mLs Intravenous Contrast Given 07/03/21 0358)    ED Course/ Medical Decision Making/ A&P Clinical Course as of 07/03/21 0448  Tue Jul 03, 2021  0343 Minimal improvement with GI cocktail.  Patient seems very frustrated regarding his GI symptoms.  We discussed his outpatient work-ups and extensive testing.  States he cannot get into see gastroenterology until April. [CH]    Clinical Course User Index [CH] Dahna Hattabaugh, Barbette Hair, MD                           Medical Decision Making  This patient presents to the ED for concern of abdominal pain, this involves an extensive number of treatment options, and is a complaint that carries with it a high risk of complications and morbidity.  The differential diagnosis includes reflux, peptic ulcer, hiatal hernia, gastritis,  pancreatitis less likely SBO  MDM:    This a 56 year old male who presents with abdominal discomfort and symptoms that he describes almost as dysmotility.  He is nontoxic and vital signs are reassuring.  He has some epigastric tenderness.  He has had multiple gastroenterology evaluations based on chart review including EGDs, gastric emptying, esophageal dilation.  He was noted to have a small hiatal hernia in 2019.  Labs obtained.  No elevated lipase and LFTs  are normal.  Patient seems very frustrated with his symptoms.  He has not had a recent CT scan.  CT scan obtained.  CT is largely unremarkable.  There are several incidental findings.  He also has some evidence of slow transit which would fit his describe symptoms.  Do not feel he clinically has an ileus/SBO as he describes being able to have bowel movements and no vomiting. recommend clear liquid diet for 2 to 3 days as a trial.  We will trial Reglan as well.  Recommend gastroenterology follow-up. (Labs, imaging)  Labs: I Ordered, and personally interpreted labs.  The pertinent results include: Normal CMP and CBC   Imaging Studies ordered: I ordered imaging studies including CT abdomen I independently visualized and interpreted imaging. 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 PCI 09/2020.   Constipation    Coronary  vasospasm (HCC)    Essential hypertension    GERD (gastroesophageal reflux disease)    Hyperlipidemia    Type 2 diabetes mellitus ([place])       Additional history obtained from chart review External records from outside source obtained and reviewed including [facility] records, [facility] records   Cardiac Monitoring: The patient was maintained on a cardiac monitor.  I personally viewed and interpreted the cardiac monitored which showed an underlying rhythm of: Normal sinus rhythm   Medicines  Meds ordered this encounter  Medications   AND Linked Order Group    alum & mag hydroxide-simeth (MAALOX/MYLANTA) 200-200-20 MG/5ML suspension 30 mL    lidocaine (XYLOCAINE) 2 % viscous mouth solution 15 mL   iohexol (OMNIPAQUE) 300 MG/ML solution 100 mL   metoCLOPramide (REGLAN) 10 MG tablet    Sig: Take 1 tablet (10 mg total) by mouth every 6 (six) hours as needed for nausea.    Dispense:  30 tablet    Refill:  0     I have reviewed the patients home medicines and have made adjustments as needed   Problem List / ED Course: Problem List Items Addressed This Visit   None Visit Diagnoses     Epigastric pain    -  Primary                   Final Clinical Impression(s) / ED Diagnoses Final diagnoses:  Epigastric pain    Rx / DC Orders ED Discharge Orders          Ordered    metoCLOPramide (REGLAN) 10 MG tablet  Every 6 hours PRN        07/03/21 0448              Merryl Hacker, MD 07/03/21 0451    Merryl Hacker, MD 07/03/21 [phone]

## 2021-07-03 NOTE — Progress Notes (Signed)
Referring Provider: Curlene Labrum, MD Primary Care Physician:  Curlene Labrum, MD Primary GI: Dr. Gala Romney   Chief Complaint  Patient presents with   Abdominal Pain    Mid upper abd. Went to ED   Nausea    And vomiting    HPI:   Jerome Shepherd is a 56 y.o. male presenting today with a history of NASH, GERD, chronic epigastric pain. Normal manometry study in March 2020 through [facility]. Normal GES. He was seen in the ED earlier this morning and brought in as an urgent visit. CT abd/pelvis with contrast noted mild gastric fluid distension, nonspecific small bowel enteritis, some segments in left abdomen slightly dilated which could indicate ileus or low-grade early small bowel obstruction. No obvious transitional segment. Other findings as below. Labs: lipase normal. Given GI cocktail while in the ED. Minimal improvement.   Endorses a chronic history of epigastric pain, early satiety, intermittent vomiting spells with relief after laxative. This presentation is the worse he has ever felt. Difficult to complete sentences due to pain.   For last 2-4 months, food would hang in his upper abdomen with associated nausea and pain. The only way to have relief is take prune-lax and feel like he would have relief with diarrhea. Acid would also back up into throat and felt like he was going to vomit. Ate banana sandwich for lunch and tomato soup yesterday. Pain worsened over night. Will bulge up when he feels blocked up. Feels like this is the worse episode. Left the ED last night and then vomited. Tried to go back in and said would need a readmission fee. Small amount of flatus. Had clear watery diarrhea this morning in our office. Feeling nauseated now. No fever. Pain worsening. Feels diaphoretic.    Colonoscopy completed 08/22/2016 which found a single 3 mm polyp in the rectum, otherwise normal.  Surgical pathology found the polyp to be hyperplastic.  Recommended repeat colonoscopy in 10  years.  EGD Aug 2019: normal esophagus s/p dilation, tiny hiatal hernia, otherwise normal.    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 PCI 09/2020.   Constipation    Coronary vasospasm (HCC)    Essential hypertension    GERD (gastroesophageal reflux disease)    Hyperlipidemia    Type 2 diabetes mellitus ([place])     Past Surgical History:  Procedure Laterality Date   BIOPSY  02/19/2018   Procedure: BIOPSY;  Surgeon: Daneil Dolin, MD;  [facility];  Service: Endoscopy;;  esophagus   CARDIAC CATHETERIZATION  2013   non obstructive CAD  ~20%   CHOLECYSTECTOMY N/A 04/01/2014   Procedure: LAPAROSCOPIC CHOLECYSTECTOMY;  Surgeon: Jamesetta So, MD;  Location: [facility];  Service: General;  Laterality: N/A;   COLONOSCOPY WITH PROPOFOL N/A 08/22/2016   Procedure: COLONOSCOPY WITH PROPOFOL;  Surgeon: Daneil Dolin, MD;  [facility];  Service: Endoscopy;  Laterality: N/A;  730    CORONARY ATHERECTOMY N/A 10/05/2020   Procedure: CORONARY ATHERECTOMY;  Surgeon: Troy Sine, MD;  Location: [place] [facility];  Service: Cardiovascular;  Laterality: N/A;   ESOPHAGOGASTRODUODENOSCOPY N/A 01/10/2014   Dr. Gala Romney: antral erosions, path with chronic inactive gastritis. Empiric dilation with 56-F dilation   ESOPHAGOGASTRODUODENOSCOPY (EGD) WITH PROPOFOL N/A 02/16/2016  Dr. Gala Romney: small hiatal hernia, normal esopagus s/p empiric dilation   ESOPHAGOGASTRODUODENOSCOPY (EGD) WITH PROPOFOL N/A 02/19/2018   Procedure: ESOPHAGOGASTRODUODENOSCOPY (EGD) WITH PROPOFOL;  Surgeon: Daneil Dolin, MD;  [facility];  Service: Endoscopy;  Laterality: N/A;  10:15am   KNEE ARTHROSCOPY Right 2002   LEFT HEART CATH N/A 08/18/2011   Procedure: LEFT HEART CATH;  Surgeon: Lorretta Harp, MD;  Location: [facility];  Service: Cardiovascular;  Laterality: N/A;    LEFT HEART CATH AND CORONARY ANGIOGRAPHY N/A 10/04/2020   Procedure: LEFT HEART CATH AND CORONARY ANGIOGRAPHY;  Surgeon: Troy Sine, MD;  Location: [place] [facility];  Service: Cardiovascular;  Laterality: N/A;   LIVER BIOPSY N/A 04/01/2014   mild fibrosis   MALONEY DILATION N/A 01/10/2014   Procedure: Venia Minks DILATION;  Surgeon: Daneil Dolin, MD;  [facility];  Service: Endoscopy;  Laterality: N/A;   MALONEY DILATION N/A 02/16/2016   Procedure: Venia Minks DILATION;  Surgeon: Daneil Dolin, MD;  [facility];  Service: Endoscopy;  Laterality: N/A;   MALONEY DILATION N/A 02/19/2018   Procedure: Venia Minks DILATION;  Surgeon: Daneil Dolin, MD;  [facility];  Service: Endoscopy;  Laterality: N/A;   POLYPECTOMY  08/22/2016   Procedure: POLYPECTOMY;  Surgeon: Daneil Dolin, MD;  [facility];  Service: Endoscopy;;   SAVORY DILATION N/A 01/10/2014   Procedure: Azzie Almas DILATION;  Surgeon: Daneil Dolin, MD;  [facility];  Service: Endoscopy;  Laterality: N/A;   TEMPORARY PACEMAKER N/A 10/05/2020   Procedure: TEMPORARY PACEMAKER;  Surgeon: Troy Sine, MD;  Location: [place] [facility];  Service: Cardiovascular;  Laterality: N/A;    Current Outpatient Medications  Medication Sig Dispense Refill   amLODipine (NORVASC) 5 MG tablet Take 1 tablet by mouth daily.     Ascorbic Acid (VITAMIN C) 1000 MG tablet Take 1,000 mg by mouth in the morning and at bedtime.     aspirin EC 81 MG tablet Take 1 tablet (81 mg total) by mouth daily.     atorvastatin (LIPITOR) 40 MG tablet Take 1 tablet (40 mg total) by mouth daily. 90 tablet 3   carvedilol (COREG) 3.125 MG tablet Take 1 tablet (3.125 mg total) by mouth 2 (two) times daily with a meal. 60 tablet 11   cholecalciferol (VITAMIN D3) 25 MCG (1000 UNIT) tablet Take 1,000 Units by mouth daily.     clopidogrel (PLAVIX) 75 MG tablet Take 1 tablet (75 mg total) by mouth daily. 90 tablet 3    dapagliflozin propanediol (FARXIGA) 10 MG TABS tablet Take 1 tablet (10 mg total) by mouth daily before breakfast. 30 tablet 11   Dulaglutide 1.5 MG/0.5ML SOPN Inject 1.5 mg into the skin every Sunday.     isosorbide mononitrate (IMDUR) 60 MG 24 hr tablet TAKE ONE TABLET BY MOUTH EVERY DAY PATIENT NEEDS OFFICE VISIT (Patient taking differently: Take 60 mg by mouth daily.) 7 tablet 0   loratadine-pseudoephedrine (CLARITIN-D 24-HOUR) 10-240 MG 24 hr tablet Take 1 tablet by mouth daily as needed for allergies.     losartan (COZAAR) 50 MG tablet TAKE 1 TABLET DAILY 90 tablet 3   Magnesium 500 MG TABS Take 500 mg by mouth daily as needed (for leg cramps).      metFORMIN (GLUCOPHAGE) 500 MG tablet Take 500 mg by mouth 2 (two) times daily.      metoCLOPramide (REGLAN) 10 MG tablet Take 1 tablet (10 mg total)  by mouth every 6 (six) hours as needed for nausea. 30 tablet 0   nitroGLYCERIN (NITROSTAT) 0.4 MG SL tablet Place 1 tablet (0.4 mg total) under the tongue every 5 (five) minutes as needed for chest pain. 25 tablet 3   pantoprazole (PROTONIX) 40 MG tablet Take 1 tablet (40 mg total) by mouth 2 (two) times daily before a meal. 60 tablet 11   fluticasone (FLONASE) 50 MCG/ACT nasal spray Place 1 spray into both nostrils daily as needed for allergies or rhinitis. (Patient not taking: Reported on 07/03/2021)     No current facility-administered medications for this visit.    Allergies as of 07/03/2021 - Review Complete 07/03/2021  Allergen Reaction Noted   Codeine Nausea Only 08/18/2011   Diamox [acetazolamide] Nausea Only 09/28/2018   Sulfa antibiotics Nausea Only 08/18/2011    Family History  Problem Relation Age of Onset   Diabetes type II Sister    Heart attack Cousin    CAD Other        Multiple family on both sides with CAD   Colon cancer Neg Hx    Stomach cancer Neg Hx    Esophageal cancer Neg Hx     Social History   Socioeconomic History   Marital status: Single    Spouse name: Not  on file   Number of children: Not on file   Years of education: Not on file   Highest education level: Not on file  Occupational History   Occupation: truck Education administrator: [employer]  Tobacco Use   Smoking status: Former    Packs/day: 1.50    Years: 15.00    Pack years: 22.50    Types: Cigarettes    Quit date: 08/18/1995    Years since quitting: 25.8   Smokeless tobacco: Never   Tobacco comments:    Quit smoking x 20 years  Vaping Use   Vaping Use: Never used  Substance and Sexual Activity   Alcohol use: No    Alcohol/week: 0.0 standard drinks   Drug use: No   Sexual activity: Not on file  Other Topics Concern   Not on file  Social History Narrative   Lives alone   Caffeine use: tea daily, no soda    Truck diver   Right handed    Divorced, no children, works as a Production designer, theatre/television/film.   Social Determinants of Health   Financial Resource Strain: Not on file  Food Insecurity: Not on file  Transportation Needs: Not on file  Physical Activity: Not on file  Stress: Not on file  Social Connections: Not on file    Review of Systems: See HPI  Physical Exam: BP 101/69    Pulse 91    Temp (!) 96.9 F (36.1 C) (Temporal)    Ht 6\' 3"  (1.905 m)    Wt 263 lb 3.2 oz (119.4 kg)    BMI 32.90 kg/m  General:   Alert and oriented. Acutely ill-appearing Head:  Normocephalic and atraumatic. Eyes:  Conjuctiva clear without scleral icterus. Mouth:  mask in place Abdomen:  +BS, soft, TTP epigastric and non-distended. No rebound or guarding. No HSM or masses noted. Msk:  Symmetrical without gross deformities. Normal posture. Extremities:  Without edema. Neurologic:  Alert and  oriented x4 Psych:  anxious affect  Lab Results  Component Value Date   WBC 10.3 07/03/2021   HGB 17.4 (H) 07/03/2021   HCT 48.7 07/03/2021   MCV 91.7 07/03/2021   PLT 232  07/03/2021   Lab Results  Component Value Date   ALT 27 07/03/2021   AST 55 (H) 07/03/2021   ALKPHOS 66 07/03/2021    BILITOT 1.1 07/03/2021   Lab Results  Component Value Date   CREATININE 0.91 07/03/2021   BUN 17 07/03/2021   NA 137 07/03/2021   K 4.2 07/03/2021   CL 108 07/03/2021   CO2 20 (L) 07/03/2021   Lab Results  Component Value Date   LIPASE 38 07/03/2021    CLINICAL DATA:  Abdominal pain and reflux.   EXAM: CT ABDOMEN AND PELVIS WITH CONTRAST   TECHNIQUE: Multidetector CT imaging of the abdomen and pelvis was performed using the standard protocol following bolus administration of intravenous contrast.   CONTRAST:  147mL OMNIPAQUE IOHEXOL 300 MG/ML  SOLN   COMPARISON:  CT without contrast 03/10/2016   FINDINGS: Lower chest: No acute abnormality. Calcifications are present in the LAD and right coronary arteries. The cardiac size is normal.   Hepatobiliary: 23 cm length mildly steatotic liver, with improvement in the steatosis. There is no mass enhancement. Gallbladder is absent and there is no biliary dilatation.   Pancreas: Unremarkable. No pancreatic ductal dilatation or surrounding inflammatory changes.   Spleen: Enlarged measuring 15.9 cm in length, unchanged. No mass enhancement.   Adrenals/Urinary Tract: No adrenal or renal cortical mass is seen. On the left there is asymmetric cortical thinning. There is no stone or hydronephrosis, no bladder thickening.   Stomach/Bowel: There is mild gastric fluid distention. There are thickened folds in the left upper to mid abdominal small bowel with slightly dilated segments in the left mid to lower abdomen up to 2.9 cm. There are fluid filled normal caliber segments in the right abdomen with slight mucosal enhancement. A transitional segment is not seen. The appendix is normal. The large intestine wall is unremarkable aside from uncomplicated sigmoid diverticula.   Vascular/Lymphatic: There is moderate aortoiliac atherosclerosis. No AAA. No enlarged abdominal or pelvic lymph nodes.   Reproductive: Normal prostate.    Other: There are small umbilical and inguinal fat hernias. There is no free air, hemorrhage or fluid.   Musculoskeletal: There are degenerative changes with endplate Schmorl's nodes in the lower thoracic spine. Facet hypertrophy lower lumbar spine with spondylosis. This includes advanced L4-5 facet hypertrophy with moderate to severe degenerative spinal stenosis and bilateral foraminal stenosis. No worrisome regional bone lesion.   IMPRESSION: 1. Evidence of a nonspecific small bowel enteritis. Some segments in the left abdomen are slightly dilated which could indicate evidence of an ileus or a low-grade early small-bowel obstruction. A transitional segment could not be found and there is no mesenteric edema. 2. Uncomplicated sigmoid diverticula. 3. Asymmetric left renal volume loss.  No stones or hydronephrosis. 4. Aortic atherosclerosis. 5. Mild hepatic steatosis and prominence, mild splenomegaly. The liver is less steatotic than in 2017. 6. Coronary artery calcifications. 7. Acquired spinal stenosis L4-5 with foraminal stenosis.   ASSESSMENT/PLAN: Jerome Shepherd is a 56 y.o. male presenting today with a history of NASH, GERD, chronic episodic epigastric pain. Normal manometry study in March 2020 through [facility]. Normal GES. He was seen in the ED earlier this morning and brought in as an urgent visit. CT abd/pelvis with contrast noted mild gastric fluid distension, nonspecific small bowel enteritis, some segments in left abdomen slightly dilated which could indicate ileus or low-grade early small bowel obstruction. No obvious transitional segment. Other findings as outlined in CT Labs: lipase normal. Given GI cocktail while in the ED.  Minimal improvement.    As pain is worsening, I suspect he has evolving ileus, possible partial obstruction. Vomiting occurred after leaving ED. He appears acutely ill. Interestingly, he has had episodes like this for years without imaging on file. His  only episode of diarrhea was right before being seen as outpatient after ED evaluation.   We discussed returning to the ED for admission. May benefit from NG tube. Possible surgical consult depending on his clinical course. GI can follow if necessary.   Once symptoms improve, recommend dedicated imaging of small bowel. Could consider capsule study as well with [facility]. He has not had extensive abdominal surgeries. I question whether this chronic history is actually intermittent episodes such as how he is presenting now. Prior extensive evaluation noted.   I have called the ED charge nurse to inform of his arrival.    Annitta Needs, PhD, ANP-BC [facility]

## 2021-07-03 NOTE — ED Triage Notes (Signed)
Pt c/o upper abd pain from hiatel hernia. Pt also c/o acid coming up in throat.

## 2021-07-03 NOTE — H&P (Addendum)
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  He was sent for CT of abdomen with findings worrisome for small bowel enteritis and early partial small bowel obstruction.  He had reported that he was having pain in the mid upper abdominal area that is constant but seems to worsen with eating meals.  He also has been having gurgling in the abdomen and feeling like food does not pass easily into his colon.  He takes prune juice laxatives for relief of constipation.  He reportedly vomited a GI cocktail that was given this morning in the ED.  After his evaluation in the ED he was sent for urgent follow-up in the GI clinic.  He was seen at [facility] earlier today.  He noted worsening symptoms and was sent back to the ED for admission.  He reports ongoing malaise.  His last EGD and colonoscopy was in 2018.  He reports that he wants to have the problem fixed this time.  He reports last bowel movement was yesterday.  He is not actively vomiting at this time but has been experiencing nausea and also experiencing severe acid reflux symptoms and gurgling in the stomach ED Course: His labs have been reassuring.  His influenza testing and SARS 2 coronavirus testing has been negative.  CT scan findings positive for evidence of a nonspecific small bowel  enteritis. Some segments in the left abdomen are slightly dilated which could indicate evidence of an ileus or a low-grade early small-bowel obstruction. A transitional segment could not be found and there is no mesenteric edema.  Pt reported that the IV pain meds given in ED have relieved the symptoms but they have not completely resolved.    Review of Systems: Review of Systems  Constitutional:  Positive for malaise/fatigue. Negative for chills, diaphoresis, fever and weight loss.  HENT: Negative.    Eyes: Negative.   Respiratory: Negative.    Cardiovascular: Negative.   Gastrointestinal:  Positive for abdominal pain, constipation, heartburn and nausea. Negative for blood in stool, diarrhea, melena and vomiting.  Genitourinary: Negative.   Musculoskeletal: Negative.   Skin: Negative.   Neurological: Negative.   Endo/Heme/Allergies: Negative.   Psychiatric/Behavioral: Negative.    All other systems reviewed and are negative.   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 PCI 09/2020.   Constipation    Coronary vasospasm (HCC)    Essential hypertension    GERD (gastroesophageal reflux disease)    Hyperlipidemia    Type 2 diabetes mellitus [facility])     Past Surgical History:  Procedure Laterality Date   BIOPSY  02/19/2018   Procedure: BIOPSY;  Surgeon: Daneil Dolin, MD;  Location: [facility];  Service: Endoscopy;;  esophagus   CARDIAC CATHETERIZATION  2013   non obstructive CAD  ~20%   CHOLECYSTECTOMY N/A 04/01/2014   Procedure: LAPAROSCOPIC CHOLECYSTECTOMY;  Surgeon: Jamesetta So, MD;  Location: [facility] ORS;  Service: General;  Laterality: N/A;   COLONOSCOPY WITH PROPOFOL N/A 08/22/2016   Procedure: COLONOSCOPY WITH PROPOFOL;  Surgeon: Daneil Dolin, MD;  Location: [facility];  Service: Endoscopy;  Laterality: N/A;  730    CORONARY ATHERECTOMY N/A  10/05/2020   Procedure: CORONARY ATHERECTOMY;  Surgeon: Troy Sine, MD;  Location: [place] CV LAB;  Service: Cardiovascular;  Laterality: N/A;   ESOPHAGOGASTRODUODENOSCOPY N/A 01/10/2014   Dr. Gala Romney: antral erosions, path with chronic inactive gastritis. Empiric dilation with 56-F dilation   ESOPHAGOGASTRODUODENOSCOPY (EGD) WITH PROPOFOL N/A 02/16/2016   Dr. Gala Romney: small hiatal hernia, normal esopagus s/p empiric dilation   ESOPHAGOGASTRODUODENOSCOPY (EGD) WITH PROPOFOL N/A 02/19/2018   Procedure: ESOPHAGOGASTRODUODENOSCOPY (EGD) WITH PROPOFOL;  Surgeon: Daneil Dolin, MD;  [facility];  Service: Endoscopy;  Laterality: N/A;  10:15am   KNEE ARTHROSCOPY Right 2002   LEFT HEART CATH N/A 08/18/2011   Procedure: LEFT HEART CATH;  Surgeon: Lorretta Harp, MD;  Location: [facility] CATH LAB;  Service: Cardiovascular;  Laterality: N/A;   LEFT HEART CATH AND CORONARY ANGIOGRAPHY N/A 10/04/2020   Procedure: LEFT HEART CATH AND CORONARY ANGIOGRAPHY;  Surgeon: Troy Sine, MD;  Location: [place] CV LAB;  Service: Cardiovascular;  Laterality: N/A;   LIVER BIOPSY N/A 04/01/2014   mild fibrosis   MALONEY DILATION N/A 01/10/2014   Procedure: Venia Minks DILATION;  Surgeon: Daneil Dolin, MD;  [facility];  Service: Endoscopy;  Laterality: N/A;   MALONEY DILATION N/A 02/16/2016   Procedure: Venia Minks DILATION;  Surgeon: Daneil Dolin, MD;  [facility];  Service: Endoscopy;  Laterality: N/A;   MALONEY DILATION N/A 02/19/2018   Procedure: Venia Minks DILATION;  Surgeon: Daneil Dolin, MD;  [facility];  Service: Endoscopy;  Laterality: N/A;   POLYPECTOMY  08/22/2016   Procedure: POLYPECTOMY;  Surgeon: Daneil Dolin, MD;  [facility];  Service: Endoscopy;;   SAVORY DILATION N/A 01/10/2014   Procedure: Azzie Almas DILATION;  Surgeon: Daneil Dolin, MD;  [facility];  Service: Endoscopy;  Laterality: N/A;   TEMPORARY PACEMAKER N/A 10/05/2020    Procedure: TEMPORARY PACEMAKER;  Surgeon: Troy Sine, MD;  Location: [place] CV LAB;  Service: Cardiovascular;  Laterality: N/A;     reports that he quit smoking about 25 years ago. His smoking use included cigarettes. He has a 22.50 pack-year smoking history. He has never used smokeless tobacco. He reports that he does not drink alcohol and does not use drugs.  Allergies  Allergen Reactions   Codeine Nausea Only   Diamox [Acetazolamide] Nausea Only    dizziness   Sulfa Antibiotics Nausea Only    Family History  Problem Relation Age of Onset   Diabetes type II Sister    Heart attack Cousin    CAD Other        Multiple family on both sides with CAD   Colon cancer Neg Hx    Stomach cancer Neg Hx    Esophageal cancer Neg Hx     Prior to Admission medications   Medication Sig Start Date End Date Taking? Authorizing Provider  amLODipine (NORVASC) 5 MG tablet Take 1 tablet by mouth daily. 09/17/20  Yes [provider]  Ascorbic  Acid (VITAMIN C) 1000 MG tablet Take 1,000 mg by mouth in the morning and at bedtime.   Yes [provider]  aspirin EC 81 MG tablet Take 1 tablet (81 mg total) by mouth daily. 11/23/15  Yes Herminio Commons, MD  atorvastatin (LIPITOR) 40 MG tablet Take 1 tablet (40 mg total) by mouth daily. 10/06/20  Yes Troy Sine, MD  carvedilol (COREG) 3.125 MG tablet Take 1 tablet (3.125 mg total) by mouth 2 (two) times daily with a meal. 10/06/20  Yes Troy Sine, MD  cholecalciferol (VITAMIN D3) 25 MCG (1000 UNIT) tablet Take 1,000 Units by mouth daily.   Yes [provider]  clopidogrel (PLAVIX) 75 MG tablet Take 1 tablet (75 mg total) by mouth daily. 10/10/20  Yes Cheryln Manly, NP  dapagliflozin propanediol (FARXIGA) 10 MG TABS tablet Take 1 tablet (10 mg total) by mouth daily before breakfast. 10/06/20  Yes Troy Sine, MD  Dulaglutide 1.5 MG/0.5ML SOPN Inject 1.5 mg into the skin every Sunday.   Yes [provider]  isosorbide mononitrate (IMDUR) 60 MG 24 hr tablet TAKE ONE TABLET BY MOUTH EVERY DAY PATIENT NEEDS OFFICE VISIT Patient taking differently: Take 60 mg by mouth daily. 01/15/17  Yes Herminio Commons, MD  loratadine-pseudoephedrine (CLARITIN-D 24-HOUR) 10-240 MG 24 hr tablet Take 1 tablet by mouth daily as needed for allergies.   Yes [provider]  losartan (COZAAR) 50 MG tablet TAKE 1 TABLET DAILY Patient taking differently: Take 50 mg by mouth daily. 04/27/21  Yes Verta Ellen., NP  Magnesium 500 MG TABS Take 500 mg by mouth daily as needed (for leg cramps).    Yes [provider]  metFORMIN (GLUCOPHAGE) 500 MG tablet Take 500 mg by mouth 2 (two) times daily.    Yes [provider]  pantoprazole (PROTONIX) 40 MG tablet Take 1 tablet (40 mg total) by mouth 2 (two) times daily before a meal. 10/09/20  Yes Cheryln Manly, NP  metoCLOPramide (REGLAN) 10 MG tablet Take 1 tablet (10 mg total) by mouth every 6 (six) hours as needed for nausea. 07/03/21   Horton, Barbette Hair, MD  nitroGLYCERIN (NITROSTAT) 0.4 MG SL tablet Place 1 tablet (0.4 mg total) under the tongue every 5 (five) minutes as needed for chest pain. 07/18/15   Herminio Commons, MD    Physical Exam: Vitals:   07/03/21 1056 07/03/21 1150 07/03/21 1230  BP: 103/69 122/77 126/77  Pulse: 100 91 81  Resp: 20 18 17   Temp: 98.1 F (36.7 C)    TempSrc: Oral    SpO2: 98% 96% 92%    Constitutional: chronically ill appearing male, agitated, NAD.  Eyes: PERRL, lids and conjunctivae normal ENMT: Mucous membranes are dry. Posterior pharynx clear of any exudate or lesions.Normal dentition.  Neck: normal, supple, no masses, no thyromegaly Respiratory: clear to auscultation bilaterally, no wheezing, no crackles. Normal respiratory effort. No accessory muscle use.  Cardiovascular: normal s1, s2 sounds, no murmurs / rubs / gallops. No extremity edema. 2+ pedal pulses. No carotid bruits.   Abdomen: diffuse epigastric tenderness, no masses palpated. No hepatosplenomegaly. Bowel sounds hyperactive.  Musculoskeletal: no clubbing / cyanosis. No joint deformity upper and lower extremities. Good ROM, no contractures. Normal muscle tone.  Skin: no rashes, lesions, ulcers. No induration Neurologic: CN 2-12 grossly intact. Sensation intact, DTR normal. Strength 5/5 in all 4.  Psychiatric: Normal judgment and insight. Alert and oriented x 3. Normal mood.  Labs on Admission: I have personally reviewed following labs and imaging studies  CBC: Recent Labs  Lab 07/03/21 0209 07/03/21 1141  WBC 10.3 8.9  NEUTROABS 8.7* 7.1  HGB 17.4* 17.1*  HCT 48.7 48.6  MCV 91.7 91.0  PLT 232 580   Basic Metabolic Panel: Recent Labs  Lab 07/03/21 0209 07/03/21 1141  NA 137 138  K 4.2 3.9  CL 108 106  CO2 20* 21*  GLUCOSE 172* 157*  BUN 17 18  CREATININE 0.91 1.01  CALCIUM 8.8* 8.8*   GFR: Estimated Creatinine Clearance: 112.4 mL/min (by C-G formula based on SCr of 1.01 mg/dL). Liver Function Tests: Recent Labs  Lab 07/03/21 0209 07/03/21 1141  AST 55* 55*  ALT 27 32  ALKPHOS 66 67  BILITOT 1.1 1.3*  PROT 7.4 7.3  ALBUMIN 4.4 4.6   Recent Labs  Lab 07/03/21 0209  LIPASE 38   No results for input(s): AMMONIA in the last 168 hours. Coagulation Profile: No results for input(s): INR, PROTIME in the last 168 hours. Cardiac Enzymes: No results for input(s): CKTOTAL, CKMB, CKMBINDEX, TROPONINI in the last 168 hours. BNP (last 3 results) No results for input(s): PROBNP in the last 8760 hours. HbA1C: No results for input(s): HGBA1C in the last 72 hours. CBG: No results for input(s): GLUCAP in the last 168 hours. Lipid Profile: No results for input(s): CHOL, HDL, LDLCALC, TRIG, CHOLHDL, LDLDIRECT in the last 72 hours. Thyroid Function Tests: No results for input(s): TSH, T4TOTAL, FREET4, T3FREE, THYROIDAB in the last 72 hours. Anemia Panel: No results for input(s):  VITAMINB12, FOLATE, FERRITIN, TIBC, IRON, RETICCTPCT in the last 72 hours. Urine analysis:    Component Value Date/Time   COLORURINE YELLOW 03/11/2016 [place] 03/11/2016 1749   LABSPEC 1.028 03/11/2016 1749   PHURINE 5.5 03/11/2016 1749   GLUCOSEU 100 (A) 03/11/2016 1749   HGBUR NEGATIVE 03/11/2016 1749   BILIRUBINUR SMALL (A) 03/11/2016 1749   KETONESUR 15 (A) 03/11/2016 1749   PROTEINUR NEGATIVE 03/11/2016 1749   NITRITE NEGATIVE 03/11/2016 1749   LEUKOCYTESUR TRACE (A) 03/11/2016 1749    Radiological Exams on Admission: CT ABDOMEN PELVIS W CONTRAST  Result Date: 07/03/2021 CLINICAL DATA:  Abdominal pain and reflux. EXAM: CT ABDOMEN AND PELVIS WITH CONTRAST TECHNIQUE: Multidetector CT imaging of the abdomen and pelvis was performed using the standard protocol following bolus administration of intravenous contrast. CONTRAST:  166m OMNIPAQUE IOHEXOL 300 MG/ML  SOLN COMPARISON:  CT without contrast 03/10/2016 FINDINGS: Lower chest: No acute abnormality. Calcifications are present in the LAD and right coronary arteries. The cardiac size is normal. Hepatobiliary: 23 cm length mildly steatotic liver, with improvement in the steatosis. There is no mass enhancement. Gallbladder is absent and there is no biliary dilatation. Pancreas: Unremarkable. No pancreatic ductal dilatation or surrounding inflammatory changes. Spleen: Enlarged measuring 15.9 cm in length, unchanged. No mass enhancement. Adrenals/Urinary Tract: No adrenal or renal cortical mass is seen. On the left there is asymmetric cortical thinning. There is no stone or hydronephrosis, no bladder thickening. Stomach/Bowel: There is mild gastric fluid distention. There are thickened folds in the left upper to mid abdominal small bowel with slightly dilated segments in the left mid to lower abdomen up to 2.9 cm. There are fluid filled normal caliber segments in the right abdomen with slight mucosal enhancement. A transitional  segment is not seen. The appendix is normal. The large intestine wall is unremarkable aside from uncomplicated sigmoid diverticula. Vascular/Lymphatic: There is moderate aortoiliac atherosclerosis. No  AAA. No enlarged abdominal or pelvic lymph nodes. Reproductive: Normal prostate. Other: There are small umbilical and inguinal fat hernias. There is no free air, hemorrhage or fluid. Musculoskeletal: There are degenerative changes with endplate Schmorl's nodes in the lower thoracic spine. Facet hypertrophy lower lumbar spine with spondylosis. This includes advanced L4-5 facet hypertrophy with moderate to severe degenerative spinal stenosis and bilateral foraminal stenosis. No worrisome regional bone lesion. IMPRESSION: 1. Evidence of a nonspecific small bowel enteritis. Some segments in the left abdomen are slightly dilated which could indicate evidence of an ileus or a low-grade early small-bowel obstruction. A transitional segment could not be found and there is no mesenteric edema. 2. Uncomplicated sigmoid diverticula. 3. Asymmetric left renal volume loss.  No stones or hydronephrosis. 4. Aortic atherosclerosis. 5. Mild hepatic steatosis and prominence, mild splenomegaly. The liver is less steatotic than in 2017. 6. Coronary artery calcifications. 7. Acquired spinal stenosis L4-5 with foraminal stenosis. Electronically Signed   By: Telford Nab M.D.   On: 07/03/2021 04:33    EKG: Independently reviewed. NSR   Assessment/Plan Principal Problem:   Partial small bowel obstruction (HCC) Active Problems:   Diabetes mellitus type 2, controlled ([place])   CAD (coronary artery disease), minimal, non obstructive 2013   GERD (gastroesophageal reflux disease)   Esophageal dysphagia, with solids only, leading to chest pain   HTN (hypertension)   Chronic pain syndrome   Nausea without vomiting   Epigastric abdominal pain - acute on chronic - likely exacerbated by enteritis  - he also has severe GERD symptoms  -  NPO for now, ice chips, sips with meds - no emesis at this time - Place NG tube to low suction - IV protonix BID ordered  - IV pain and nausea medications ordered  - consult to GI team   Partial early small bowel obstruction  - Place NG now to low suction - NPO, ice chips, sips with meds - add IV metoclopramide - I advised patient that he should seriously consider STOPPING TRULICITY as this is likely contributing to his symptoms and presentation.  Pt does not believe that Trulicity could be contributing to his symptoms.    Small bowel enteritis  - IV ciprofloxacin/metronidazole ordered  Type 2 diabetes mellitus with neurological complications - follow up A1c - CBG testing and SSI coverage ordered - as above strongly advised patient to consider stopping trulicity injections - no concentrated sweets or fruit juices except to treat a low blood glucose   Essential hypertension  - well controlled - resumed home medications  CAD - resume home medication -  no chest pain symptoms at this time    DVT prophylaxis: SQ heparin   Code Status: Full   Family Communication:   Disposition Plan: anticipate home   Consults called: GI   Admission status: INP   Level of care: Med-Surg Irwin Brakeman MD [facility] How to contact the [facility] Attending or Consulting provider [place] or covering provider during after hours [place], for this patient?  Check the care team in [facility] and look for a) attending/consulting [facility] provider listed and b) the [facility] team listed Log into [email or web address] and use [place]'s universal password to access. If you do not have the password, please contact the hospital operator. Locate the [facility] provider you are looking for under [facility] and page to a number that you can be directly reached. If you still have difficulty reaching the provider, please page the [facility] (Director on Call) for  the Hospitalists listed on amion for assistance.   If 7PM-7AM, please  contact night-coverage [email or web address] Password [email or web address]  07/03/2021, 1:36 PM

## 2021-07-03 NOTE — ED Triage Notes (Signed)
Referred to GI early this am and sent back here by GI for admission

## 2021-07-03 NOTE — Plan of Care (Signed)

## 2021-07-03 NOTE — Discharge Instructions (Signed)
You are seen today for ongoing abdominal pain and slow transit symptoms.  Trial Reglan to see if this helps.  You may benefit from having more clear liquid diet for several days to see if this helps with motility.  Follow-up with gastroenterology.

## 2021-07-04 ENCOUNTER — Inpatient Hospital Stay (HOSPITAL_COMMUNITY): Payer: BC Managed Care – PPO

## 2021-07-04 DIAGNOSIS — I2510 Atherosclerotic heart disease of native coronary artery without angina pectoris: Secondary | ICD-10-CM

## 2021-07-04 DIAGNOSIS — E1142 Type 2 diabetes mellitus with diabetic polyneuropathy: Secondary | ICD-10-CM

## 2021-07-04 DIAGNOSIS — I10 Essential (primary) hypertension: Secondary | ICD-10-CM

## 2021-07-04 DIAGNOSIS — K219 Gastro-esophageal reflux disease without esophagitis: Secondary | ICD-10-CM

## 2021-07-04 DIAGNOSIS — R1013 Epigastric pain: Secondary | ICD-10-CM

## 2021-07-04 DIAGNOSIS — K56609 Unspecified intestinal obstruction, unspecified as to partial versus complete obstruction: Secondary | ICD-10-CM

## 2021-07-04 DIAGNOSIS — R110 Nausea: Secondary | ICD-10-CM

## 2021-07-04 DIAGNOSIS — K529 Noninfective gastroenteritis and colitis, unspecified: Secondary | ICD-10-CM

## 2021-07-04 LAB — GLUCOSE, CAPILLARY
Glucose-Capillary: 105 mg/dL — ABNORMAL HIGH (ref 70–99)
Glucose-Capillary: 107 mg/dL — ABNORMAL HIGH (ref 70–99)
Glucose-Capillary: 109 mg/dL — ABNORMAL HIGH (ref 70–99)
Glucose-Capillary: 122 mg/dL — ABNORMAL HIGH (ref 70–99)
Glucose-Capillary: 131 mg/dL — ABNORMAL HIGH (ref 70–99)
Glucose-Capillary: 138 mg/dL — ABNORMAL HIGH (ref 70–99)

## 2021-07-04 LAB — CBC
HCT: 44.2 % (ref 39.0–52.0)
Hemoglobin: 14.9 g/dL (ref 13.0–17.0)
MCH: 31.8 pg (ref 26.0–34.0)
MCHC: 33.7 g/dL (ref 30.0–36.0)
MCV: 94.4 fL (ref 80.0–100.0)
Platelets: 169 10*3/uL (ref 150–400)
RBC: 4.68 MIL/uL (ref 4.22–5.81)
RDW: 12 % (ref 11.5–15.5)
WBC: 4.5 10*3/uL (ref 4.0–10.5)
nRBC: 0 % (ref 0.0–0.2)

## 2021-07-04 LAB — COMPREHENSIVE METABOLIC PANEL
ALT: 27 U/L (ref 0–44)
AST: 40 U/L (ref 15–41)
Albumin: 3.5 g/dL (ref 3.5–5.0)
Alkaline Phosphatase: 53 U/L (ref 38–126)
Anion gap: 6 (ref 5–15)
BUN: 17 mg/dL (ref 6–20)
CO2: 25 mmol/L (ref 22–32)
Calcium: 8.3 mg/dL — ABNORMAL LOW (ref 8.9–10.3)
Chloride: 108 mmol/L (ref 98–111)
Creatinine, Ser: 0.96 mg/dL (ref 0.61–1.24)
GFR, Estimated: >60 mL/min (ref >60)
Glucose, Bld: 110 mg/dL — ABNORMAL HIGH (ref 70–99)
Potassium: 3.8 mmol/L (ref 3.5–5.1)
Sodium: 139 mmol/L (ref 135–145)
Total Bilirubin: 0.7 mg/dL (ref 0.3–1.2)
Total Protein: 5.9 g/dL — ABNORMAL LOW (ref 6.5–8.1)

## 2021-07-04 LAB — MAGNESIUM: Magnesium: 2.3 mg/dL (ref 1.7–2.4)

## 2021-07-04 MED ORDER — INSULIN ASPART 100 UNIT/ML IJ SOLN
0.0000 [IU] | Freq: Three times a day (TID) | INTRAMUSCULAR | Status: DC
  Administered 2021-07-04 – 2021-07-06 (×4): 1 [IU] via SUBCUTANEOUS

## 2021-07-04 MED ORDER — INSULIN ASPART 100 UNIT/ML IJ SOLN: 0.0000 [IU] | Freq: Every day | INTRAMUSCULAR | Status: DC

## 2021-07-04 MED ORDER — INSULIN ASPART 100 UNIT/ML IJ SOLN
2.0000 [IU] | Freq: Three times a day (TID) | INTRAMUSCULAR | Status: DC
  Administered 2021-07-05 – 2021-07-06 (×2): 2 [IU] via SUBCUTANEOUS

## 2021-07-04 MED ORDER — SODIUM CHLORIDE 0.9 % IV SOLN: INTRAVENOUS | Status: DC

## 2021-07-04 NOTE — Progress Notes (Signed)
Patient has been resting comfortably today with his girlfriend at the bedside. He hasn't had very much to eat or drink, states he starts to feel "gurgling" or reflux when he does. Patient was seen by gastroenterology today and has a plan for EGD tomorrow. Patient to be NPO after midnight and Consent has been signed.

## 2021-07-04 NOTE — Progress Notes (Signed)
Subjective: Patient states that for the past 2-4 months, anytime he eats he has severe pain in epigastric area/upper abdomen, she states that it doesn't matter what he eats, he feels that food stops and "backs up" he states that meats do tend to be worse but he has pain with anything he eats. He endorses ongoing reflux. Is on pantoprazole 40mg  outpatient, states that this had worked well for a while but now is having reflux symptoms almost daily. Occasionally has nausea. He is passing flatulence. Last BM was Sunday. He reports BM every day to every other day. Has to take prune lax, he reports he hears gurgling in LUQ abdomen and then he will have a BM. States that he takes laxative every other day. Occasionally he will have to take imodium to help with diarrhea as metformin will sometimes cause him to go too much. He has been on trulicity for the past 3 years. Has had some intentional weight loss since cardiac stent placement, due to dietary changes  Objective: Vital signs in last 24 hours: Temp:  [97.9 F (36.6 C)-98.9 F (37.2 C)] 98.4 F (36.9 C) (01/11 0511) Pulse Rate:  [64-100] 64 (01/11 0511) Resp:  [15-20] 18 (01/11 0511) BP: (103-131)/(45-77) 121/72 (01/11 0511) SpO2:  [92 %-99 %] 95 % (01/11 0511) Weight:  [118.6 kg] 118.6 kg (01/10 1416) Last BM Date: 07/01/21 General:   Alert and oriented, pleasant Head:  Normocephalic and atraumatic. Eyes:  No icterus, sclera clear. Conjuctiva pink.  Mouth:  Without lesions, mucosa pink and moist.  Heart:  S1, S2 present, no murmurs noted.  Lungs: Clear to auscultation bilaterally, without wheezing, rales, or rhonchi.  Abdomen:  Bowel sounds present, soft, mild TTP of epigastric area. non-distended. No HSM or hernias noted. No rebound or guarding. No masses appreciated  Msk:  Symmetrical without gross deformities. Normal posture. Pulses:  Normal pulses noted. Extremities:  Without clubbing or edema. Neurologic:  Alert and  oriented x4;   grossly normal neurologically. Skin:  Warm and dry, intact without significant lesions.  Psych:  Alert and cooperative. Normal mood and affect.  Intake/Output from previous day: 01/10 0701 - 01/11 0700 In: 1305.6 [P.O.:580; I.V.:125.9; IV Piggyback:599.7] Out: -  Intake/Output this shift: Total I/O In: 480 [P.O.:480] Out: -   Lab Results: Recent Labs    07/03/21 0209 07/03/21 1141 07/04/21 0513  WBC 10.3 8.9 4.5  HGB 17.4* 17.1* 14.9  HCT 48.7 48.6 44.2  PLT 232 231 169   BMET Recent Labs    07/03/21 0209 07/03/21 1141 07/04/21 0513  NA 137 138 139  K 4.2 3.9 3.8  CL 108 106 108  CO2 20* 21* 25  GLUCOSE 172* 157* 110*  BUN 17 18 17   CREATININE 0.91 1.01 0.96  CALCIUM 8.8* 8.8* 8.3*   LFT Recent Labs    07/03/21 0209 07/03/21 1141 07/04/21 0513  PROT 7.4 7.3 5.9*  ALBUMIN 4.4 4.6 3.5  AST 55* 55* 40  ALT 27 32 27  ALKPHOS 66 67 53  BILITOT 1.1 1.3* 0.7   Studies/Results: CT ABDOMEN PELVIS W CONTRAST  Result Date: 07/03/2021 CLINICAL DATA:  Abdominal pain and reflux. EXAM: CT ABDOMEN AND PELVIS WITH CONTRAST TECHNIQUE: Multidetector CT imaging of the abdomen and pelvis was performed using the standard protocol following bolus administration of intravenous contrast. CONTRAST:  137mL OMNIPAQUE IOHEXOL 300 MG/ML  SOLN COMPARISON:  CT without contrast 03/10/2016 FINDINGS: Lower chest: No acute abnormality. Calcifications are present in the LAD and right coronary arteries.  The cardiac size is normal. Hepatobiliary: 23 cm length mildly steatotic liver, with improvement in the steatosis. There is no mass enhancement. Gallbladder is absent and there is no biliary dilatation. Pancreas: Unremarkable. No pancreatic ductal dilatation or surrounding inflammatory changes. Spleen: Enlarged measuring 15.9 cm in length, unchanged. No mass enhancement. Adrenals/Urinary Tract: No adrenal or renal cortical mass is seen. On the left there is asymmetric cortical thinning. There is no  stone or hydronephrosis, no bladder thickening. Stomach/Bowel: There is mild gastric fluid distention. There are thickened folds in the left upper to mid abdominal small bowel with slightly dilated segments in the left mid to lower abdomen up to 2.9 cm. There are fluid filled normal caliber segments in the right abdomen with slight mucosal enhancement. A transitional segment is not seen. The appendix is normal. The large intestine wall is unremarkable aside from uncomplicated sigmoid diverticula. Vascular/Lymphatic: There is moderate aortoiliac atherosclerosis. No AAA. No enlarged abdominal or pelvic lymph nodes. Reproductive: Normal prostate. Other: There are small umbilical and inguinal fat hernias. There is no free air, hemorrhage or fluid. Musculoskeletal: There are degenerative changes with endplate Schmorl's nodes in the lower thoracic spine. Facet hypertrophy lower lumbar spine with spondylosis. This includes advanced L4-5 facet hypertrophy with moderate to severe degenerative spinal stenosis and bilateral foraminal stenosis. No worrisome regional bone lesion. IMPRESSION: 1. Evidence of a nonspecific small bowel enteritis. Some segments in the left abdomen are slightly dilated which could indicate evidence of an ileus or a low-grade early small-bowel obstruction. A transitional segment could not be found and there is no mesenteric edema. 2. Uncomplicated sigmoid diverticula. 3. Asymmetric left renal volume loss.  No stones or hydronephrosis. 4. Aortic atherosclerosis. 5. Mild hepatic steatosis and prominence, mild splenomegaly. The liver is less steatotic than in 2017. 6. Coronary artery calcifications. 7. Acquired spinal stenosis L4-5 with foraminal stenosis. Electronically Signed   By: Telford Nab M.D.   On: 07/03/2021 04:33    Assessment: 56 year old male with history of NASH, GERD, DM, CAD s/p PCI 10/11/20, HLD, HTN, chronic epigastric pain. Seen at his primary GI office yesterday for urgent visit  after a visit to the ED earlier that morning. CT abd/pelvis with contrast noted mild gastric fluid distention, nonspecific small bowel enteritis, some segments in left abdomen slightly dilated which could indicate ileus or low-grade early SBO. No obvious transitional segment. Lipase WNL at that time. Patient seen by GI provider with reports of feeling acutely worse with nausea, diarrhea and diaphoresis.  Patient referred to ED for further evaluation.  Epigastric pain/enteritis/possible SBO vs Ileus: previous GES (2017) and manometry (2020) both normal. . Epigastric pain for the past 2-4 months, constant but worse when eating solid foods and feels as though food will not pass through, typically gets relief if he takes a laxative. Also feels that acid is backing up in his throat.  Multiple EGDs in the past, last EGD 2019 with tiny hiatal hernia with suspicion for occult gastroparesis. last colonoscopy with one 16mm polyp in rectum. Last BM was yesterday, had 1 episode of vomiting yesterday as well. No n/v today. CT A/P yesterday with concern for possible SBO vs ileus and enteritis.  He was started on flagyl and cipro yesterday for suspected enteritis. We will proceed with EGD tomorrow for further evaluation as last endoscopic eval was in 2019 with suspicion for gastroparesis.    Plan: Acute abd series Continue PPI BID Anti emetics and pain meds per hospitalist Continue with IV reglan EGD  for further evaluation   LOS: 1 day    07/04/2021, 10:07 AM   Kadelyn Dimascio L. Alver Sorrow, MSN, APRN, AGNP-C Adult-Gerontology Nurse Practitioner [facility]

## 2021-07-04 NOTE — Progress Notes (Signed)
error 

## 2021-07-04 NOTE — Plan of Care (Signed)

## 2021-07-04 NOTE — Progress Notes (Signed)
[facility] PROGRESS NOTE    Progress Note  Jerome Shepherd  PIR:[id number] DOB: [birth year] DOA: 07/03/2021 PCP: Curlene Labrum, MD     Brief Narrative:   Jerome Shepherd is an 56 y.o. male past medical history significant for chronic abdominal pain, diabetes mellitus type 2 on Trulicity injection once a year, severe GERD, large for hernia, CAD status post PCI on 10/11/2020, hyperlipidemia, essential hypertension who came in to the [facility], ED for abdominal pain CT scan of the abdomen showed small bowel enteritis with some slight bowel dilation which could indicate ileus versus early small bowel obstruction    Assessment/Plan:   Epigastric abdominal pain acute on chronic: Imaging showed concerns for possible enteritis versus ileus or small bowel obstruction. The patient cannot tell me when was his last bowel movement. He also has a history of GERD. He has had no nausea or vomiting since he has been admitted, he has not had a bowel movement but is passing gas. Allow clear liquid diet advance as tolerated. Started on Protonix IV twice daily. GI has been consulted. Continue Reglan Was started empirically on IV ciprofloxacin and Flagyl. Has remained afebrile, his white count dropped by 50%.  Diabetes mellitus type 2, controlled ([place]) A1C of 6.4, his blood glucose seems relatively well controlled has not received any insulins overnight. Hold metformin, start him on sliding scale insulin.  Essential hypertension: Well-controlled continue current home regimen.  CAD (coronary artery disease), minimal, non obstructive 2013 Resume home medications, noted currently symptom-free.  Chronic pain syndrome Continue Reglan every 8 hours.   DVT prophylaxis: lovenox Family Communication:none Status is: Inpatient  Remains inpatient appropriate because: Acute abdominal pain with possible ileus versus enteritis      Code Status:     Code Status Orders  (From admission,  onward)           Start     Ordered   07/03/21 1332  Full code  Continuous        07/03/21 1336           Code Status History     Date Active Date Inactive Code Status Order ID Comments User Context   10/08/2020 2057 10/09/2020 2149 Full Code [id number]  Felipa Evener Inpatient   10/04/2020 1237 10/06/2020 1647 Full Code [id number]  Troy Sine, MD Inpatient   03/12/2016 0003 03/19/2016 1507 Full Code [id number]  Rise Patience, MD ED   01/17/2014 0447 01/18/2014 2053 Full Code [id number]  Phillips Grout, MD Inpatient         IV Access:   Peripheral IV   Procedures and diagnostic studies:   CT ABDOMEN PELVIS W CONTRAST  Result Date: 07/03/2021 CLINICAL DATA:  Abdominal pain and reflux. EXAM: CT ABDOMEN AND PELVIS WITH CONTRAST TECHNIQUE: Multidetector CT imaging of the abdomen and pelvis was performed using the standard protocol following bolus administration of intravenous contrast. CONTRAST:  178m OMNIPAQUE IOHEXOL 300 MG/ML  SOLN COMPARISON:  CT without contrast 03/10/2016 FINDINGS: Lower chest: No acute abnormality. Calcifications are present in the LAD and right coronary arteries. The cardiac size is normal. Hepatobiliary: 23 cm length mildly steatotic liver, with improvement in the steatosis. There is no mass enhancement. Gallbladder is absent and there is no biliary dilatation. Pancreas: Unremarkable. No pancreatic ductal dilatation or surrounding inflammatory changes. Spleen: Enlarged measuring 15.9 cm in length, unchanged. No mass enhancement. Adrenals/Urinary Tract: No adrenal or renal cortical mass is seen. On the left there is asymmetric cortical  thinning. There is no stone or hydronephrosis, no bladder thickening. Stomach/Bowel: There is mild gastric fluid distention. There are thickened folds in the left upper to mid abdominal small bowel with slightly dilated segments in the left mid to lower abdomen up to 2.9 cm. There are fluid filled normal caliber  segments in the right abdomen with slight mucosal enhancement. A transitional segment is not seen. The appendix is normal. The large intestine wall is unremarkable aside from uncomplicated sigmoid diverticula. Vascular/Lymphatic: There is moderate aortoiliac atherosclerosis. No AAA. No enlarged abdominal or pelvic lymph nodes. Reproductive: Normal prostate. Other: There are small umbilical and inguinal fat hernias. There is no free air, hemorrhage or fluid. Musculoskeletal: There are degenerative changes with endplate Schmorl's nodes in the lower thoracic spine. Facet hypertrophy lower lumbar spine with spondylosis. This includes advanced L4-5 facet hypertrophy with moderate to severe degenerative spinal stenosis and bilateral foraminal stenosis. No worrisome regional bone lesion. IMPRESSION: 1. Evidence of a nonspecific small bowel enteritis. Some segments in the left abdomen are slightly dilated which could indicate evidence of an ileus or a low-grade early small-bowel obstruction. A transitional segment could not be found and there is no mesenteric edema. 2. Uncomplicated sigmoid diverticula. 3. Asymmetric left renal volume loss.  No stones or hydronephrosis. 4. Aortic atherosclerosis. 5. Mild hepatic steatosis and prominence, mild splenomegaly. The liver is less steatotic than in 2017. 6. Coronary artery calcifications. 7. Acquired spinal stenosis L4-5 with foraminal stenosis. Electronically Signed   By: Telford Nab M.D.   On: 07/03/2021 04:33     Medical Consultants:   None.   Subjective:    Jerome Shepherd he denies any nausea or vomiting in house  Objective:    Vitals:   07/03/21 1826 07/03/21 2012 07/04/21 0018 07/04/21 0511  BP: 127/73 131/77 (!) 126/45 121/72  Pulse: 81 78 76 64  Resp: 15 18 17 18   Temp: 98.8 F (37.1 C) 98.9 F (37.2 C) 97.9 F (36.6 C) 98.4 F (36.9 C)  TempSrc:      SpO2: 95% 96% 95% 95%  Weight:      Height:       SpO2: 95 %   Intake/Output Summary  (Last 24 hours) at 07/04/2021 0747 Last data filed at 07/04/2021 0600 Gross per 24 hour  Intake 1305.64 ml  Output --  Net 1305.64 ml   Filed Weights   07/03/21 1416  Weight: 118.6 kg    Exam: General exam: In no acute distress. Respiratory system: Good air movement and clear to auscultation. Cardiovascular system: S1 & S2 heard, RRR. No JVD. Gastrointestinal system: Positive bowel sounds soft no rebound or guarding very mild epigastric pain no distention Extremities: No pedal edema. Skin: No rashes, lesions or ulcers Psychiatry: Judgement and insight appear normal. Mood & affect appropriate.    Data Reviewed:    Labs: Basic Metabolic Panel: Recent Labs  Lab 07/03/21 0209 07/03/21 1141 07/04/21 0513  NA 137 138 139  K 4.2 3.9 3.8  CL 108 106 108  CO2 20* 21* 25  GLUCOSE 172* 157* 110*  BUN 17 18 17   CREATININE 0.91 1.01 0.96  CALCIUM 8.8* 8.8* 8.3*  MG  --   --  2.3   GFR Estimated Creatinine Clearance: 117.8 mL/min (by C-G formula based on SCr of 0.96 mg/dL). Liver Function Tests: Recent Labs  Lab 07/03/21 0209 07/03/21 1141 07/04/21 0513  AST 55* 55* 40  ALT 27 32 27  ALKPHOS 66 67 53  BILITOT 1.1 1.3*  0.7  PROT 7.4 7.3 5.9*  ALBUMIN 4.4 4.6 3.5   Recent Labs  Lab 07/03/21 0209  LIPASE 38   No results for input(s): AMMONIA in the last 168 hours. Coagulation profile No results for input(s): INR, PROTIME in the last 168 hours. COVID-19 Labs  No results for input(s): DDIMER, FERRITIN, LDH, CRP in the last 72 hours.  Lab Results  Component Value Date   SARSCOV2NAA NEGATIVE 07/03/2021   [place] NEGATIVE 10/08/2020   [place] NEGATIVE 10/02/2020    CBC: Recent Labs  Lab 07/03/21 0209 07/03/21 1141 07/04/21 0513  WBC 10.3 8.9 4.5  NEUTROABS 8.7* 7.1  --   HGB 17.4* 17.1* 14.9  HCT 48.7 48.6 44.2  MCV 91.7 91.0 94.4  PLT 232 231 169   Cardiac Enzymes: No results for input(s): CKTOTAL, CKMB, CKMBINDEX, TROPONINI in the last 168  hours. BNP (last 3 results) No results for input(s): PROBNP in the last 8760 hours. CBG: Recent Labs  Lab 07/03/21 1723 07/03/21 2015 07/04/21 0018 07/04/21 0508 07/04/21 0717  GLUCAP 130* 112* 105* 109* 122*   D-Dimer: No results for input(s): DDIMER in the last 72 hours. Hgb A1c: Recent Labs    07/03/21 1141  HGBA1C 6.4*   Lipid Profile: No results for input(s): CHOL, HDL, LDLCALC, TRIG, CHOLHDL, LDLDIRECT in the last 72 hours. Thyroid function studies: No results for input(s): TSH, T4TOTAL, T3FREE, THYROIDAB in the last 72 hours.  Invalid input(s): FREET3 Anemia work up: No results for input(s): VITAMINB12, FOLATE, FERRITIN, TIBC, IRON, RETICCTPCT in the last 72 hours. Sepsis Labs: Recent Labs  Lab 07/03/21 0209 07/03/21 1141 07/04/21 0513  WBC 10.3 8.9 4.5   Microbiology Recent Results (from the past 240 hour(s))  Resp Panel by RT-PCR (Flu A&B, Covid) Nasopharyngeal Swab     Status: None   Collection Time: 07/03/21 11:57 AM   Specimen: Nasopharyngeal Swab; Nasopharyngeal(NP) swabs in vial transport medium  Result Value Ref Range Status   SARS Coronavirus 2 by RT PCR NEGATIVE NEGATIVE Final    Comment: (NOTE) SARS-CoV-2 target nucleic acids are NOT DETECTED.  The SARS-CoV-2 RNA is generally detectable in upper respiratory specimens during the acute phase of infection. The lowest concentration of SARS-CoV-2 viral copies this assay can detect is 138 copies/mL. A negative result does not preclude SARS-Cov-2 infection and should not be used as the sole basis for treatment or other patient management decisions. A negative result may occur with  improper specimen collection/handling, submission of specimen other than nasopharyngeal swab, presence of viral mutation(s) within the areas targeted by this assay, and inadequate number of viral copies(<138 copies/mL). A negative result must be combined with clinical observations, patient history, and  epidemiological information. The expected result is Negative.  Fact Sheet for Patients:  EntrepreneurPulse.com.au  Fact Sheet for Healthcare Providers:  IncredibleEmployment.be  This test is no t yet approved or cleared by the [place] FDA and  has been authorized for detection and/or diagnosis of SARS-CoV-2 by FDA under an Emergency Use Authorization (EUA). This EUA will remain  in effect (meaning this test can be used) for the duration of the COVID-19 declaration under Section 564(b)(1) of the Act, 21 U.S.C.section 360bbb-3(b)(1), unless the authorization is terminated  or revoked sooner.       Influenza A by PCR NEGATIVE NEGATIVE Final   Influenza B by PCR NEGATIVE NEGATIVE Final    Comment: (NOTE) The Xpert Xpress SARS-CoV-2/FLU/RSV plus assay is intended as an aid in the diagnosis of influenza from Nasopharyngeal swab  specimens and should not be used as a sole basis for treatment. Nasal washings and aspirates are unacceptable for Xpert Xpress SARS-CoV-2/FLU/RSV testing.  Fact Sheet for Patients: EntrepreneurPulse.com.au  Fact Sheet for Healthcare Providers: IncredibleEmployment.be  This test is not yet approved or cleared by the [place] FDA and has been authorized for detection and/or diagnosis of SARS-CoV-2 by FDA under an Emergency Use Authorization (EUA). This EUA will remain in effect (meaning this test can be used) for the duration of the COVID-19 declaration under Section 564(b)(1) of the Act, 21 U.S.C. section 360bbb-3(b)(1), unless the authorization is terminated or revoked.  Performed at [facility], [address], [place], [place] [address]      Medications:    amLODipine  5 mg Oral Daily   aspirin EC  81 mg Oral Daily   atorvastatin  40 mg Oral QPM   carvedilol  3.125 mg Oral BID WC   clopidogrel  75 mg Oral Daily   heparin  5,000 Units Subcutaneous Q8H   insulin  aspart  0-6 Units Subcutaneous Q4H   isosorbide mononitrate  60 mg Oral Daily   metoCLOPramide (REGLAN) injection  5 mg Intravenous Q8H   pantoprazole (PROTONIX) IV  40 mg Intravenous BID   Continuous Infusions:  sodium chloride 100 mL/hr at 07/04/21 0259   ciprofloxacin 400 mg (07/04/21 0256)   metronidazole 500 mg (07/04/21 0445)      LOS: 1 day   Charlynne Cousins  [facility]  07/04/2021, 7:47 AM

## 2021-07-04 NOTE — H&P (View-Only) (Signed)
Subjective: Patient states that for the past 2-4 months, anytime he eats he has severe pain in epigastric area/upper abdomen, she states that it doesn't matter what he eats, he feels that food stops and "backs up" he states that meats do tend to be worse but he has pain with anything he eats. He endorses ongoing reflux. Is on pantoprazole 40mg  outpatient, states that this had worked well for a while but now is having reflux symptoms almost daily. Occasionally has nausea. He is passing flatulence. Last BM was Sunday. He reports BM every day to every other day. Has to take prune lax, he reports he hears gurgling in LUQ abdomen and then he will have a BM. States that he takes laxative every other day. Occasionally he will have to take imodium to help with diarrhea as metformin will sometimes cause him to go too much. He has been on trulicity for the past 3 years. Has had some intentional weight loss since cardiac stent placement, due to dietary changes  Objective: Vital signs in last 24 hours: Temp:  [97.9 F (36.6 C)-98.9 F (37.2 C)] 98.4 F (36.9 C) (01/11 0511) Pulse Rate:  [64-100] 64 (01/11 0511) Resp:  [15-20] 18 (01/11 0511) BP: (103-131)/(45-77) 121/72 (01/11 0511) SpO2:  [92 %-99 %] 95 % (01/11 0511) Weight:  [118.6 kg] 118.6 kg (01/10 1416) Last BM Date: 07/01/21 General:   Alert and oriented, pleasant Head:  Normocephalic and atraumatic. Eyes:  No icterus, sclera clear. Conjuctiva pink.  Mouth:  Without lesions, mucosa pink and moist.  Heart:  S1, S2 present, no murmurs noted.  Lungs: Clear to auscultation bilaterally, without wheezing, rales, or rhonchi.  Abdomen:  Bowel sounds present, soft, mild TTP of epigastric area. non-distended. No HSM or hernias noted. No rebound or guarding. No masses appreciated  Msk:  Symmetrical without gross deformities. Normal posture. Pulses:  Normal pulses noted. Extremities:  Without clubbing or edema. Neurologic:  Alert and  oriented x4;   grossly normal neurologically. Skin:  Warm and dry, intact without significant lesions.  Psych:  Alert and cooperative. Normal mood and affect.  Intake/Output from previous day: 01/10 0701 - 01/11 0700 In: 1305.6 [P.O.:580; I.V.:125.9; IV Piggyback:599.7] Out: -  Intake/Output this shift: Total I/O In: 480 [P.O.:480] Out: -   Lab Results: Recent Labs    07/03/21 0209 07/03/21 1141 07/04/21 0513  WBC 10.3 8.9 4.5  HGB 17.4* 17.1* 14.9  HCT 48.7 48.6 44.2  PLT 232 231 169   BMET Recent Labs    07/03/21 0209 07/03/21 1141 07/04/21 0513  NA 137 138 139  K 4.2 3.9 3.8  CL 108 106 108  CO2 20* 21* 25  GLUCOSE 172* 157* 110*  BUN 17 18 17   CREATININE 0.91 1.01 0.96  CALCIUM 8.8* 8.8* 8.3*   LFT Recent Labs    07/03/21 0209 07/03/21 1141 07/04/21 0513  PROT 7.4 7.3 5.9*  ALBUMIN 4.4 4.6 3.5  AST 55* 55* 40  ALT 27 32 27  ALKPHOS 66 67 53  BILITOT 1.1 1.3* 0.7   Studies/Results: CT ABDOMEN PELVIS W CONTRAST  Result Date: 07/03/2021 CLINICAL DATA:  Abdominal pain and reflux. EXAM: CT ABDOMEN AND PELVIS WITH CONTRAST TECHNIQUE: Multidetector CT imaging of the abdomen and pelvis was performed using the standard protocol following bolus administration of intravenous contrast. CONTRAST:  111mL OMNIPAQUE IOHEXOL 300 MG/ML  SOLN COMPARISON:  CT without contrast 03/10/2016 FINDINGS: Lower chest: No acute abnormality. Calcifications are present in the LAD and right coronary arteries.  The cardiac size is normal. Hepatobiliary: 23 cm length mildly steatotic liver, with improvement in the steatosis. There is no mass enhancement. Gallbladder is absent and there is no biliary dilatation. Pancreas: Unremarkable. No pancreatic ductal dilatation or surrounding inflammatory changes. Spleen: Enlarged measuring 15.9 cm in length, unchanged. No mass enhancement. Adrenals/Urinary Tract: No adrenal or renal cortical mass is seen. On the left there is asymmetric cortical thinning. There is no  stone or hydronephrosis, no bladder thickening. Stomach/Bowel: There is mild gastric fluid distention. There are thickened folds in the left upper to mid abdominal small bowel with slightly dilated segments in the left mid to lower abdomen up to 2.9 cm. There are fluid filled normal caliber segments in the right abdomen with slight mucosal enhancement. A transitional segment is not seen. The appendix is normal. The large intestine wall is unremarkable aside from uncomplicated sigmoid diverticula. Vascular/Lymphatic: There is moderate aortoiliac atherosclerosis. No AAA. No enlarged abdominal or pelvic lymph nodes. Reproductive: Normal prostate. Other: There are small umbilical and inguinal fat hernias. There is no free air, hemorrhage or fluid. Musculoskeletal: There are degenerative changes with endplate Schmorl's nodes in the lower thoracic spine. Facet hypertrophy lower lumbar spine with spondylosis. This includes advanced L4-5 facet hypertrophy with moderate to severe degenerative spinal stenosis and bilateral foraminal stenosis. No worrisome regional bone lesion. IMPRESSION: 1. Evidence of a nonspecific small bowel enteritis. Some segments in the left abdomen are slightly dilated which could indicate evidence of an ileus or a low-grade early small-bowel obstruction. A transitional segment could not be found and there is no mesenteric edema. 2. Uncomplicated sigmoid diverticula. 3. Asymmetric left renal volume loss.  No stones or hydronephrosis. 4. Aortic atherosclerosis. 5. Mild hepatic steatosis and prominence, mild splenomegaly. The liver is less steatotic than in 2017. 6. Coronary artery calcifications. 7. Acquired spinal stenosis L4-5 with foraminal stenosis. Electronically Signed   By: Telford Nab M.D.   On: 07/03/2021 04:33    Assessment: 56 year old male with history of NASH, GERD, DM, CAD s/p PCI 10/11/20, HLD, HTN, chronic epigastric pain. Seen at his primary GI office yesterday for urgent visit  after a visit to the ED earlier that morning. CT abd/pelvis with contrast noted mild gastric fluid distention, nonspecific small bowel enteritis, some segments in left abdomen slightly dilated which could indicate ileus or low-grade early SBO. No obvious transitional segment. Lipase WNL at that time. Patient seen by GI provider with reports of feeling acutely worse with nausea, diarrhea and diaphoresis.  Patient referred to ED for further evaluation.  Epigastric pain/enteritis/possible SBO vs Ileus: previous GES (2017) and manometry (2020) both normal. . Epigastric pain for the past 2-4 months, constant but worse when eating solid foods and feels as though food will not pass through, typically gets relief if he takes a laxative. Also feels that acid is backing up in his throat.  Multiple EGDs in the past, last EGD 2019 with tiny hiatal hernia with suspicion for occult gastroparesis. last colonoscopy with one 45mm polyp in rectum. Last BM was yesterday, had 1 episode of vomiting yesterday as well. No n/v today. CT A/P yesterday with concern for possible SBO vs ileus and enteritis.  He was started on flagyl and cipro yesterday for suspected enteritis. We will proceed with EGD tomorrow for further evaluation as last endoscopic eval was in 2019 with suspicion for gastroparesis.    Plan: Acute abd series Continue PPI BID Anti emetics and pain meds per hospitalist Continue with IV reglan EGD  for further evaluation   LOS: 1 day    07/04/2021, 10:07 AM   Quamaine Webb L. Alver Sorrow, MSN, APRN, AGNP-C Adult-Gerontology Nurse Practitioner [facility]

## 2021-07-04 NOTE — TOC Progression Note (Signed)
Transition of Care [facility]) - Progression Note    Patient Details  Name: Jerome Shepherd MRN: [id number] Date of Birth: 1965/10/25  Transition of Care [facility] CM/SW Contact  Salome Arnt, [place] Phone Number: 07/04/2021, 10:50 AM  Clinical Narrative:   [facility] of [facility]) Screening Note   Patient Details  Name: Jerome Shepherd Date of Birth: 1965-08-27   Transition of Care [facility]) CM/SW Contact:    Salome Arnt, [place] Phone Number: 07/04/2021, 10:50 AM    Transition of Care Department [facility]) has reviewed patient and no TOC needs have been identified at this time. We will continue to monitor patient advancement through interdisciplinary progression rounds. If new patient transition needs arise, please place a TOC consult.         Barriers to Discharge: Continued Medical Work up  Expected Discharge Plan and Services                                                 Social Determinants of Health (SDOH) Interventions    Readmission Risk Interventions No flowsheet data found.

## 2021-07-05 ENCOUNTER — Encounter (HOSPITAL_COMMUNITY): Payer: Self-pay | Admitting: Family Medicine

## 2021-07-05 ENCOUNTER — Inpatient Hospital Stay (HOSPITAL_COMMUNITY): Payer: BC Managed Care – PPO | Admitting: Anesthesiology

## 2021-07-05 ENCOUNTER — Encounter (HOSPITAL_COMMUNITY)
Admission: EM | Disposition: A | Discharge status: Home / Self Care | Payer: Self-pay | Source: Home / Self Care | Attending: Internal Medicine

## 2021-07-05 DIAGNOSIS — K56600 Partial intestinal obstruction, unspecified as to cause: Principal | ICD-10-CM

## 2021-07-05 DIAGNOSIS — G894 Chronic pain syndrome: Secondary | ICD-10-CM

## 2021-07-05 HISTORY — PX: ESOPHAGOGASTRODUODENOSCOPY (EGD) WITH PROPOFOL: SHX5813

## 2021-07-05 HISTORY — PX: BIOPSY: SHX5522

## 2021-07-05 LAB — COMPREHENSIVE METABOLIC PANEL
ALT: 24 U/L (ref 0–44)
AST: 39 U/L (ref 15–41)
Albumin: 3.5 g/dL (ref 3.5–5.0)
Alkaline Phosphatase: 50 U/L (ref 38–126)
Anion gap: 7 (ref 5–15)
BUN: 12 mg/dL (ref 6–20)
CO2: 25 mmol/L (ref 22–32)
Calcium: 8.5 mg/dL — ABNORMAL LOW (ref 8.9–10.3)
Chloride: 109 mmol/L (ref 98–111)
Creatinine, Ser: 0.84 mg/dL (ref 0.61–1.24)
GFR, Estimated: >60 mL/min (ref >60)
Glucose, Bld: 119 mg/dL — ABNORMAL HIGH (ref 70–99)
Potassium: 3.6 mmol/L (ref 3.5–5.1)
Sodium: 141 mmol/L (ref 135–145)
Total Bilirubin: 0.7 mg/dL (ref 0.3–1.2)
Total Protein: 5.8 g/dL — ABNORMAL LOW (ref 6.5–8.1)

## 2021-07-05 LAB — GLUCOSE, CAPILLARY
Glucose-Capillary: 135 mg/dL — ABNORMAL HIGH (ref 70–99)
Glucose-Capillary: 112 mg/dL — ABNORMAL HIGH (ref 70–99)
Glucose-Capillary: 111 mg/dL — ABNORMAL HIGH (ref 70–99)
Glucose-Capillary: 126 mg/dL — ABNORMAL HIGH (ref 70–99)
Glucose-Capillary: 137 mg/dL — ABNORMAL HIGH (ref 70–99)

## 2021-07-05 LAB — MAGNESIUM: Magnesium: 2.1 mg/dL (ref 1.7–2.4)

## 2021-07-05 SURGERY — ESOPHAGOGASTRODUODENOSCOPY (EGD) WITH PROPOFOL
Anesthesia: General

## 2021-07-05 MED ORDER — HEPARIN SODIUM (PORCINE) 5000 UNIT/ML IJ SOLN
5000.0000 [IU] | Freq: Three times a day (TID) | INTRAMUSCULAR | Status: DC
  Administered 2021-07-06: 5000 [IU] via SUBCUTANEOUS
  Filled 2021-07-05: qty 1

## 2021-07-05 MED ORDER — ONDANSETRON HCL 4 MG/2ML IJ SOLN
INTRAMUSCULAR | Status: DC | PRN
  Administered 2021-07-05: 4 mg via INTRAVENOUS

## 2021-07-05 MED ORDER — PROPOFOL 10 MG/ML IV BOLUS
INTRAVENOUS | Status: DC | PRN
  Administered 2021-07-05: 30 mg via INTRAVENOUS
  Administered 2021-07-05: 50 mg via INTRAVENOUS
  Administered 2021-07-05: 40 mg via INTRAVENOUS
  Administered 2021-07-05: 100 mg via INTRAVENOUS
  Administered 2021-07-05: 20 mg via INTRAVENOUS

## 2021-07-05 MED ORDER — STERILE WATER FOR IRRIGATION IR SOLN
Status: DC | PRN
  Administered 2021-07-05: 60 mL

## 2021-07-05 MED ORDER — LACTATED RINGERS IV SOLN
INTRAVENOUS | Status: DC
  Administered 2021-07-05: 1000 mL via INTRAVENOUS

## 2021-07-05 NOTE — Anesthesia Preprocedure Evaluation (Signed)
Anesthesia Evaluation  Patient identified by MRN, date of birth, ID band Patient awake    Reviewed: Allergy & Precautions, H&P , NPO status , Patient's Chart, lab work & pertinent test results, reviewed documented beta blocker date and time   Airway Mallampati: II  TM Distance: >3 FB Neck ROM: full    Dental no notable dental hx. (+) Teeth Intact   Pulmonary neg pulmonary ROS, shortness of breath, former smoker,    Pulmonary exam normal breath sounds clear to auscultation       Cardiovascular Exercise Tolerance: Good hypertension, + CAD and + Cardiac Stents   Rhythm:regular Rate:Normal     Neuro/Psych  Neuromuscular disease negative psych ROS   GI/Hepatic negative GI ROS, Neg liver ROS, PUD, GERD  Medicated,(+) Hepatitis -  Endo/Other  negative endocrine ROSdiabetes, Type 2  Renal/GU ARFRenal disease  negative genitourinary   Musculoskeletal   Abdominal   Peds  Hematology negative hematology ROS (+)   Anesthesia Other Findings   Reproductive/Obstetrics negative OB ROS                             Anesthesia Physical Anesthesia Plan  ASA: 4 and emergent  Anesthesia Plan: General   Post-op Pain Management:    Induction:   PONV Risk Score and Plan: Propofol infusion  Airway Management Planned:   Additional Equipment:   Intra-op Plan:   Post-operative Plan:   Informed Consent: I have reviewed the patients History and Physical, chart, labs and discussed the procedure including the risks, benefits and alternatives for the proposed anesthesia with the patient or authorized representative who has indicated his/her understanding and acceptance.     Dental Advisory Given  Plan Discussed with: CRNA  Anesthesia Plan Comments:         Anesthesia Quick Evaluation

## 2021-07-05 NOTE — Op Note (Signed)
[facility] Patient Name: Jerome Shepherd Procedure Date: 07/05/2021 10:13 AM MRN: [id number] Date of Birth: 12-14-1965 Attending MD: Norvel Richards , MD CSN: [id number] Age: 56 Admit Type: Outpatient Procedure:                Upper GI endoscopy Indications:              Epigastric abdominal pain, Nausea with vomiting Providers:                Norvel Richards, MD, Caprice Kluver, Aram Candela Referring MD:              Medicines:                Propofol per Anesthesia Complications:            No immediate complications. Estimated Blood Loss:     Estimated blood loss was minimal. Procedure:                Pre-Anesthesia Assessment:                           - Prior to the procedure, a History and Physical                            was performed, and patient medications and                            allergies were reviewed. The patient's tolerance of                            previous anesthesia was also reviewed. The risks                            and benefits of the procedure and the sedation                            options and risks were discussed with the patient.                            All questions were answered, and informed consent                            was obtained. Prior Anticoagulants: The patient has                            taken no previous anticoagulant or antiplatelet                            agents. ASA Grade Assessment: III - A patient with                            severe systemic disease. After reviewing the risks                            and benefits, the patient was deemed in  satisfactory condition to undergo the procedure.                           After obtaining informed consent, the endoscope was                            passed under direct vision. Throughout the                            procedure, the patient's blood pressure, pulse, and                            oxygen saturations were  monitored continuously. The                            [id number]-[id number] ([id number]) scope was introduced through the                            mouth, and advanced to the third part of duodenum.                            The upper GI endoscopy was accomplished without                            difficulty. The patient tolerated the procedure                            well. Scope In: 10:46:13 AM Scope Out: 10:53:54 AM Total Procedure Duration: 0 hours 7 minutes 41 seconds  Findings:      Single 5 mm erosion/excoriation distal esophagus just above GE junction.       Otherwise, esophagus appeared normal. Stomach empty. Diffuse erythema       with long linear longitudinal areas of intense erythema       circumferentially. No erosion ulcer or infiltrating process observed.       Pylorus patent. Examination of the bulb second and third portion of the       duodenum revealed subtle scattered erosions. Major papilla well seen and       appeared normal.      Biopsies of the second and third portion of the duodenum along with the       proximal mid and distal gastric mucosa taken for histologic study. Impression:               - Single distal esophageal erosion/excoriation.                            Intense erythema of the gastric mucosanonspecific;                            query sequelae of vomiting. Patent pylorus. Subtle                            scattered duodenal erosions of uncertain  significancestatus post gastric and duodenal biopsy Moderate Sedation:      Moderate (conscious) sedation was personally administered by an       anesthesia professional. The following parameters were monitored: oxygen       saturation, heart rate, blood pressure, respiratory rate, EKG, adequacy       of pulmonary ventilation, and response to care. Recommendation:           - Patient has a contact number available for                            emergencies. The signs and symptoms of  potential                            delayed complications were discussed with the                            patient. Return to normal activities tomorrow.                            Written discharge instructions were provided to the                            patient.                           - Return patient to hospital ward for ongoing care.                           - Clear liquid diet; advance as tolerated later                            today. Follow-up on pathology. Depending on                            pathology findings, may need an updated 4-hour                            solid-phase nuclear gastric emptying study (patient                            would need to come off prokinetic therapy and                            opioids 48 hours prior to the study). Procedure Code(s):        --- Professional ---                           [phone], Esophagogastroduodenoscopy, flexible,                            transoral; diagnostic, including collection of                            specimen(s) by brushing or washing, when performed                            (  separate procedure) Diagnosis Code(s):        --- Professional ---                           R10.13, Epigastric pain                           R11.2, Nausea with vomiting, unspecified CPT copyright 2019 [employer]. All rights reserved. The codes documented in this report are preliminary and upon coder review may  be revised to meet current compliance requirements. Cristopher Estimable. Yzabelle Calles, MD Norvel Richards, MD 07/05/2021 11:07:22 AM This report has been signed electronically. Number of Addenda: 0

## 2021-07-05 NOTE — Progress Notes (Signed)
[facility] PROGRESS NOTE    Progress Note  Jerome Shepherd  SEG:[id number] DOB: 1966-05-06 DOA: 07/03/2021 PCP: Curlene Labrum, MD     Brief Narrative:   Jerome Shepherd is an 56 y.o. male past medical history significant for chronic abdominal pain, diabetes mellitus type 2 on Trulicity injection once a year, severe GERD, large for hernia, CAD status post PCI on 10/11/2020, hyperlipidemia, essential hypertension who came in to the [facility], ED for abdominal pain CT scan of the abdomen showed small bowel enteritis with some slight bowel dilation which could indicate ileus versus early small bowel obstruction    Assessment/Plan:   Epigastric abdominal pain acute on chronic: Imaging showed concerns for possible enteritis versus ileus or small bowel obstruction. GI was consulted recommended EGD on 07/05/2021. Currently NPO. Continue Protonix IV twice daily. Was started empirically on IV ciprofloxacin and Flagyl. Has remained afebrile, his white count dropped by 50%.  Diabetes mellitus type 2, controlled ([place]) A1C of 6.4, blood glucose well controlled currently n.p.o. Continue to hold metformin   Essential hypertension: Continue current regimen.  CAD (coronary artery disease), minimal, non obstructive 2013 Resume home medications, noted currently symptom-free. On aspirin and Plavix.  Hemoglobin is stable.  Chronic pain syndrome Continue Reglan every 8 hours.   DVT prophylaxis: lovenox Family Communication:none Status is: Inpatient  Remains inpatient appropriate because: Acute abdominal pain with possible ileus versus enteritis      Code Status:     Code Status Orders  (From admission, onward)           Start     Ordered   07/03/21 1332  Full code  Continuous        07/03/21 1336           Code Status History     Date Active Date Inactive Code Status Order ID Comments User Context   10/08/2020 2057 10/09/2020 2149 Full Code [id number]  Jerome Shepherd Inpatient   10/04/2020 1237 10/06/2020 1647 Full Code [id number]  Jerome Sine, MD Inpatient   03/12/2016 0003 03/19/2016 1507 Full Code [id number]  Jerome Patience, MD ED   01/17/2014 0447 01/18/2014 2053 Full Code [id number]  Jerome Grout, MD Inpatient         IV Access:   Peripheral IV   Procedures and diagnostic studies:   DG ABD ACUTE 2+V W 1V CHEST  Result Date: 07/04/2021 CLINICAL DATA:  Upper abdominal pain EXAM: DG ABDOMEN ACUTE WITH 1 VIEW CHEST COMPARISON:  None. FINDINGS: Nonobstructive bowel gas pattern. No radiopaque calculi or other significant radiographic abnormality is seen. Heart size and mediastinal contours are within normal limits. Both lungs are clear. IMPRESSION: 1. Nonobstructive bowel gas pattern. 2. No acute cardiopulmonary disease. Electronically Signed   By: Yetta Glassman M.D.   On: 07/04/2021 11:21     Medical Consultants:   None.   Subjective:    Jerome Shepherd relates his pain is better today.  Objective:    Vitals:   07/04/21 0511 07/04/21 1348 07/04/21 2153 07/05/21 0613  BP: 121/72 102/61 107/60 (!) 146/67  Pulse: 64 63 (!) 59 (!) 57  Resp: 18 10 20 18   Temp: 98.4 F (36.9 C) 97.8 F (36.6 C) 97.8 F (36.6 C) 97.9 F (36.6 C)  TempSrc:  Oral Oral Oral  SpO2: 95% 96% 94% 100%  Weight:      Height:       SpO2: 100 %   Intake/Output Summary (Last  24 hours) at 07/05/2021 0736 Last data filed at 07/05/2021 0500 Gross per 24 hour  Intake 3839.38 ml  Output --  Net 3839.38 ml    Filed Weights   07/03/21 1416  Weight: 118.6 kg    Exam: General exam: In no acute distress. Respiratory system: Good air movement and clear to auscultation. Cardiovascular system: S1 & S2 heard, RRR. No JVD. Gastrointestinal system: Abdomen is nondistended, soft and nontender.  Extremities: No pedal edema. Skin: No rashes, lesions or ulcers Psychiatry: Judgement and insight appear normal. Mood & affect appropriate.  Data  Reviewed:    Labs: Basic Metabolic Panel: Recent Labs  Lab 07/03/21 0209 07/03/21 1141 07/04/21 0513 07/05/21 0440  NA 137 138 139 141  K 4.2 3.9 3.8 3.6  CL 108 106 108 109  CO2 20* 21* 25 25  GLUCOSE 172* 157* 110* 119*  BUN 17 18 17 12   CREATININE 0.91 1.01 0.96 0.84  CALCIUM 8.8* 8.8* 8.3* 8.5*  MG  --   --  2.3 2.1    GFR Estimated Creatinine Clearance: 134.6 mL/min (by C-G formula based on SCr of 0.84 mg/dL). Liver Function Tests: Recent Labs  Lab 07/03/21 0209 07/03/21 1141 07/04/21 0513 07/05/21 0440  AST 55* 55* 40 39  ALT 27 32 27 24  ALKPHOS 66 67 53 50  BILITOT 1.1 1.3* 0.7 0.7  PROT 7.4 7.3 5.9* 5.8*  ALBUMIN 4.4 4.6 3.5 3.5    Recent Labs  Lab 07/03/21 0209  LIPASE 38    No results for input(s): AMMONIA in the last 168 hours. Coagulation profile No results for input(s): INR, PROTIME in the last 168 hours. COVID-19 Labs  No results for input(s): DDIMER, FERRITIN, LDH, CRP in the last 72 hours.  Lab Results  Component Value Date   SARSCOV2NAA NEGATIVE 07/03/2021   [place] NEGATIVE 10/08/2020   [place] NEGATIVE 10/02/2020    CBC: Recent Labs  Lab 07/03/21 0209 07/03/21 1141 07/04/21 0513  WBC 10.3 8.9 4.5  NEUTROABS 8.7* 7.1  --   HGB 17.4* 17.1* 14.9  HCT 48.7 48.6 44.2  MCV 91.7 91.0 94.4  PLT 232 231 169    Cardiac Enzymes: No results for input(s): CKTOTAL, CKMB, CKMBINDEX, TROPONINI in the last 168 hours. BNP (last 3 results) No results for input(s): PROBNP in the last 8760 hours. CBG: Recent Labs  Lab 07/04/21 0717 07/04/21 1059 07/04/21 1608 07/04/21 2151 07/05/21 0716  GLUCAP 122* 131* 138* 107* 135*    D-Dimer: No results for input(s): DDIMER in the last 72 hours. Hgb A1c: Recent Labs    07/03/21 1141  HGBA1C 6.4*    Lipid Profile: No results for input(s): CHOL, HDL, LDLCALC, TRIG, CHOLHDL, LDLDIRECT in the last 72 hours. Thyroid function studies: No results for input(s): TSH, T4TOTAL,  T3FREE, THYROIDAB in the last 72 hours.  Invalid input(s): FREET3 Anemia work up: No results for input(s): VITAMINB12, FOLATE, FERRITIN, TIBC, IRON, RETICCTPCT in the last 72 hours. Sepsis Labs: Recent Labs  Lab 07/03/21 0209 07/03/21 1141 07/04/21 0513  WBC 10.3 8.9 4.5    Microbiology Recent Results (from the past 240 hour(s))  Resp Panel by RT-PCR (Flu A&B, Covid) Nasopharyngeal Swab     Status: None   Collection Time: 07/03/21 11:57 AM   Specimen: Nasopharyngeal Swab; Nasopharyngeal(NP) swabs in vial transport medium  Result Value Ref Range Status   SARS Coronavirus 2 by RT PCR NEGATIVE NEGATIVE Final    Comment: (NOTE) SARS-CoV-2 target nucleic acids are NOT DETECTED.  The  SARS-CoV-2 RNA is generally detectable in upper respiratory specimens during the acute phase of infection. The lowest concentration of SARS-CoV-2 viral copies this assay can detect is 138 copies/mL. A negative result does not preclude SARS-Cov-2 infection and should not be used as the sole basis for treatment or other patient management decisions. A negative result may occur with  improper specimen collection/handling, submission of specimen other than nasopharyngeal swab, presence of viral mutation(s) within the areas targeted by this assay, and inadequate number of viral copies(<138 copies/mL). A negative result must be combined with clinical observations, patient history, and epidemiological information. The expected result is Negative.  Fact Sheet for Patients:  EntrepreneurPulse.com.au  Fact Sheet for Healthcare Providers:  IncredibleEmployment.be  This test is no t yet approved or cleared by the [place] FDA and  has been authorized for detection and/or diagnosis of SARS-CoV-2 by FDA under an Emergency Use Authorization (EUA). This EUA will remain  in effect (meaning this test can be used) for the duration of the COVID-19 declaration under Section  564(b)(1) of the Act, 21 U.S.C.section 360bbb-3(b)(1), unless the authorization is terminated  or revoked sooner.       Influenza A by PCR NEGATIVE NEGATIVE Final   Influenza B by PCR NEGATIVE NEGATIVE Final    Comment: (NOTE) The Xpert Xpress SARS-CoV-2/FLU/RSV plus assay is intended as an aid in the diagnosis of influenza from Nasopharyngeal swab specimens and should not be used as a sole basis for treatment. Nasal washings and aspirates are unacceptable for Xpert Xpress SARS-CoV-2/FLU/RSV testing.  Fact Sheet for Patients: EntrepreneurPulse.com.au  Fact Sheet for Healthcare Providers: IncredibleEmployment.be  This test is not yet approved or cleared by the [place] FDA and has been authorized for detection and/or diagnosis of SARS-CoV-2 by FDA under an Emergency Use Authorization (EUA). This EUA will remain in effect (meaning this test can be used) for the duration of the COVID-19 declaration under Section 564(b)(1) of the Act, 21 U.S.C. section 360bbb-3(b)(1), unless the authorization is terminated or revoked.  Performed at [facility], [address], [place], [place] [address]      Medications:    amLODipine  5 mg Oral Daily   aspirin EC  81 mg Oral Daily   atorvastatin  40 mg Oral QPM   carvedilol  3.125 mg Oral BID WC   clopidogrel  75 mg Oral Daily   [START ON 07/06/2021] heparin  5,000 Units Subcutaneous Q8H   insulin aspart  0-5 Units Subcutaneous QHS   insulin aspart  0-9 Units Subcutaneous TID WC   insulin aspart  2 Units Subcutaneous TID WC   isosorbide mononitrate  60 mg Oral Daily   metoCLOPramide (REGLAN) injection  5 mg Intravenous Q8H   pantoprazole (PROTONIX) IV  40 mg Intravenous BID   Continuous Infusions:  sodium chloride 100 mL/hr at 07/05/21 0314   sodium chloride     ciprofloxacin 400 mg (07/05/21 0416)   metronidazole 500 mg (07/05/21 0619)      LOS: 2 days   Charlynne Cousins  [facility]  07/05/2021, 7:36 AM

## 2021-07-05 NOTE — Transfer of Care (Signed)
Immediate Anesthesia Transfer of Care Note  Patient: TYAN DY  Procedure(s) Performed: ESOPHAGOGASTRODUODENOSCOPY (EGD) WITH PROPOFOL BIOPSY  Patient Location: PACU  Anesthesia Type:General  Level of Consciousness: awake  Airway & Oxygen Therapy: Patient Spontanous Breathing  Post-op Assessment: Report given to RN  Post vital signs: Reviewed  Last Vitals:  Vitals Value Taken Time  BP 122/77 07/05/21 1101  Temp    Pulse 66 07/05/21 1101  Resp 17 07/05/21 1101  SpO2 94 % 07/05/21 1101  Vitals shown include unvalidated device data.  Last Pain:  Vitals:   07/05/21 1040  TempSrc:   PainSc: 0-No pain      Patients Stated Pain Goal: 8 (19/01/22 2411)  Complications: No notable events documented.

## 2021-07-05 NOTE — Interval H&P Note (Signed)
History and Physical Interval Note:  07/05/2021 10:37 AM  Jerome Shepherd  has presented today for surgery, with the diagnosis of Epigastric pain bloating nausea and vomiting.  The various methods of treatment have been discussed with the patient and family. After consideration of risks, benefits and other options for treatment, the patient has consented to  Procedure(s) with comments: ESOPHAGOGASTRODUODENOSCOPY (EGD) WITH PROPOFOL (N/A) - Patient is on as a surgical intervention.  The patient's history has been reviewed, patient examined, no change in status, stable for surgery.  I have reviewed the patient's chart and labs.  Questions were answered to the patient's satisfaction.     [name]   Patient seen and examined in short stay room 2.  Patient endorses esophageal spasms dysphagia and reflux much better on chronic PPI therapy.  Epigastric bloating pain nausea and vomiting are his main concerns.  CT scan reviewed.  Gastric emptying study normal back in 2017.   Agree with need for diagnostic EGD.  The risks, benefits, limitations, alternatives and imponderables have been reviewed with the patient. Potential for esophageal dilation, biopsy, etc. have also been reviewed.  Questions have been answered. All parties agreeable. \    Further recommendations to follow.

## 2021-07-05 NOTE — Anesthesia Postprocedure Evaluation (Signed)
Anesthesia Post Note  Patient: ORBIE GRUPE  Procedure(s) Performed: ESOPHAGOGASTRODUODENOSCOPY (EGD) WITH PROPOFOL BIOPSY  Patient location during evaluation: PACU Anesthesia Type: General Level of consciousness: awake and alert Pain management: pain level controlled Vital Signs Assessment: post-procedure vital signs reviewed and stable Respiratory status: spontaneous breathing Cardiovascular status: blood pressure returned to baseline and stable Postop Assessment: no apparent nausea or vomiting Anesthetic complications: no   No notable events documented.   Last Vitals:  Vitals:   07/05/21 0950 07/05/21 1032  BP: 131/79 (!) 150/115  Pulse:    Resp: 18   Temp: 36.6 C   SpO2: 99%     Last Pain:  Vitals:   07/05/21 1040  TempSrc:   PainSc: 0-No pain                 Breonna Gafford

## 2021-07-06 ENCOUNTER — Telehealth: Payer: Self-pay | Admitting: Internal Medicine

## 2021-07-06 DIAGNOSIS — R1010 Upper abdominal pain, unspecified: Secondary | ICD-10-CM

## 2021-07-06 DIAGNOSIS — R112 Nausea with vomiting, unspecified: Secondary | ICD-10-CM

## 2021-07-06 LAB — COMPREHENSIVE METABOLIC PANEL
ALT: 27 U/L (ref 0–44)
AST: 42 U/L — ABNORMAL HIGH (ref 15–41)
Albumin: 3.7 g/dL (ref 3.5–5.0)
Alkaline Phosphatase: 58 U/L (ref 38–126)
Anion gap: 8 (ref 5–15)
BUN: 10 mg/dL (ref 6–20)
CO2: 26 mmol/L (ref 22–32)
Calcium: 8.7 mg/dL — ABNORMAL LOW (ref 8.9–10.3)
Chloride: 106 mmol/L (ref 98–111)
Creatinine, Ser: 0.92 mg/dL (ref 0.61–1.24)
GFR, Estimated: >60 mL/min (ref >60)
Glucose, Bld: 124 mg/dL — ABNORMAL HIGH (ref 70–99)
Potassium: 3.7 mmol/L (ref 3.5–5.1)
Sodium: 140 mmol/L (ref 135–145)
Total Bilirubin: 0.7 mg/dL (ref 0.3–1.2)
Total Protein: 6.2 g/dL — ABNORMAL LOW (ref 6.5–8.1)

## 2021-07-06 LAB — SURGICAL PATHOLOGY

## 2021-07-06 LAB — GLUCOSE, CAPILLARY: Glucose-Capillary: 125 mg/dL — ABNORMAL HIGH (ref 70–99)

## 2021-07-06 LAB — MAGNESIUM: Magnesium: 2 mg/dL (ref 1.7–2.4)

## 2021-07-06 MED ORDER — PANTOPRAZOLE SODIUM 40 MG PO TBEC: 40.0000 mg | DELAYED_RELEASE_TABLET | Freq: Two times a day (BID) | ORAL | 11 refills | Status: DC

## 2021-07-06 NOTE — Telephone Encounter (Signed)
Per EGD notes: "Follow-up on pathology.Depending on pathology findings, may need an updated 4-hour solid-phase nuclear gastric emptying study (patient would need to come off prokinetic therapy and opioids 48 hours prior to the study)"  Forwarding to Dr. Gala Romney

## 2021-07-06 NOTE — Discharge Summary (Signed)
Physician Discharge Summary  Jerome Shepherd MBW:[id number] DOB: 18-Jul-1965 DOA: 07/03/2021  PCP: Curlene Labrum, MD  Admit date: 07/03/2021 Discharge date: 07/06/2021  Admitted From: Home Disposition:  Home  Recommendations for Outpatient Follow-up:  Follow up with Gi in 1 weeks, for gastric emptying study as an outpatient. Please obtain BMP/CBC in one week   Home Health:No Equipment/Devices:none  Discharge Condition:Stable CODE STATUS:Full Diet recommendation: Heart Healthy   Brief/Interim Summary:  56 y.o. male past medical history significant for chronic abdominal pain, diabetes mellitus type 2 on Trulicity injection once a year, severe GERD, large for hernia, CAD status post PCI on 10/11/2020, hyperlipidemia, essential hypertension who came in to the [facility], ED for abdominal pain CT scan of the abdomen showed small bowel enteritis with some slight bowel dilation which could indicate ileus versus early small bowel obstruction  Discharge Diagnoses:  Principal Problem:   Partial small bowel obstruction ([place]) Active Problems:   Diabetes mellitus type 2, controlled ([place])   CAD (coronary artery disease), minimal, non obstructive 2013   GERD (gastroesophageal reflux disease)   Esophageal dysphagia, with solids only, leading to chest pain   HTN (hypertension)   Chronic pain syndrome   Nausea without vomiting   Enteritis  Epigastric abdominal pain acute on chronic: CT of the abdomen pelvis was concerning for possible enteritis versus ileus. GI was consulted to perform an EGD on 07/05/2021 that showed single distal esophageal erosion and erythema in the gastric mucosa scattered duodenal erosion.  On admission he was started on a PPI which was changed to oral which she will continue twice a day. GI recommended a EGD as an outpatient. His diet was advanced which he tolerated well follow-up with GI for gastric emptying study.  Diabetes mellitus type 2: Resume metformin no changes  made.  Essential hypertension:  No changes made to his medication.  CAD nonobstructive 2013: No changes made to his medication continue aspirin and Plavix hemoglobin stable.  Chronic pain syndrome: Noted no changes made to his medication.  Discharge Instructions  Discharge Instructions     Diet - low sodium heart healthy   Complete by: As directed    Increase activity slowly   Complete by: As directed       Allergies as of 07/06/2021       Reactions   Codeine Nausea Only   Diamox [acetazolamide] Nausea Only   dizziness   Sulfa Antibiotics Nausea Only        Medication List     TAKE these medications    amLODipine 5 MG tablet Commonly known as: NORVASC Take 1 tablet by mouth daily.   aspirin EC 81 MG tablet Take 1 tablet (81 mg total) by mouth daily.   atorvastatin 40 MG tablet Commonly known as: LIPITOR Take 1 tablet (40 mg total) by mouth daily.   carvedilol 3.125 MG tablet Commonly known as: COREG Take 1 tablet (3.125 mg total) by mouth 2 (two) times daily with a meal.   cholecalciferol 25 MCG (1000 UNIT) tablet Commonly known as: VITAMIN D3 Take 1,000 Units by mouth daily.   clopidogrel 75 MG tablet Commonly known as: PLAVIX Take 1 tablet (75 mg total) by mouth daily.   Dulaglutide 1.5 MG/0.5ML Sopn Inject 1.5 mg into the skin every Sunday.   Farxiga 10 MG Tabs tablet Generic drug: dapagliflozin propanediol Take 1 tablet (10 mg total) by mouth daily before breakfast.   isosorbide mononitrate 60 MG 24 hr tablet Commonly known as: IMDUR TAKE ONE  TABLET BY MOUTH EVERY DAY PATIENT NEEDS OFFICE VISIT What changed: See the new instructions.   loratadine-pseudoephedrine 10-240 MG 24 hr tablet Commonly known as: CLARITIN-D 24-hour Take 1 tablet by mouth daily as needed for allergies.   losartan 50 MG tablet Commonly known as: COZAAR TAKE 1 TABLET DAILY What changed:  how much to take how to take this when to take this additional  instructions   Magnesium 500 MG Tabs Take 500 mg by mouth daily as needed (for leg cramps).   metFORMIN 500 MG tablet Commonly known as: GLUCOPHAGE Take 500 mg by mouth 2 (two) times daily.   metoCLOPramide 10 MG tablet Commonly known as: REGLAN Take 1 tablet (10 mg total) by mouth every 6 (six) hours as needed for nausea.   nitroGLYCERIN 0.4 MG SL tablet Commonly known as: NITROSTAT Place 1 tablet (0.4 mg total) under the tongue every 5 (five) minutes as needed for chest pain.   pantoprazole 40 MG tablet Commonly known as: PROTONIX Take 1 tablet (40 mg total) by mouth 2 (two) times daily. What changed: when to take this   vitamin C 1000 MG tablet Take 1,000 mg by mouth in the morning and at bedtime.        Allergies  Allergen Reactions   Codeine Nausea Only   Diamox [Acetazolamide] Nausea Only    dizziness   Sulfa Antibiotics Nausea Only    Consultations: Gastroenterology   Procedures/Studies: CT ABDOMEN PELVIS W CONTRAST  Result Date: 07/03/2021 CLINICAL DATA:  Abdominal pain and reflux. EXAM: CT ABDOMEN AND PELVIS WITH CONTRAST TECHNIQUE: Multidetector CT imaging of the abdomen and pelvis was performed using the standard protocol following bolus administration of intravenous contrast. CONTRAST:  125m OMNIPAQUE IOHEXOL 300 MG/ML  SOLN COMPARISON:  CT without contrast 03/10/2016 FINDINGS: Lower chest: No acute abnormality. Calcifications are present in the LAD and right coronary arteries. The cardiac size is normal. Hepatobiliary: 23 cm length mildly steatotic liver, with improvement in the steatosis. There is no mass enhancement. Gallbladder is absent and there is no biliary dilatation. Pancreas: Unremarkable. No pancreatic ductal dilatation or surrounding inflammatory changes. Spleen: Enlarged measuring 15.9 cm in length, unchanged. No mass enhancement. Adrenals/Urinary Tract: No adrenal or renal cortical mass is seen. On the left there is asymmetric cortical thinning.  There is no stone or hydronephrosis, no bladder thickening. Stomach/Bowel: There is mild gastric fluid distention. There are thickened folds in the left upper to mid abdominal small bowel with slightly dilated segments in the left mid to lower abdomen up to 2.9 cm. There are fluid filled normal caliber segments in the right abdomen with slight mucosal enhancement. A transitional segment is not seen. The appendix is normal. The large intestine wall is unremarkable aside from uncomplicated sigmoid diverticula. Vascular/Lymphatic: There is moderate aortoiliac atherosclerosis. No AAA. No enlarged abdominal or pelvic lymph nodes. Reproductive: Normal prostate. Other: There are small umbilical and inguinal fat hernias. There is no free air, hemorrhage or fluid. Musculoskeletal: There are degenerative changes with endplate Schmorl's nodes in the lower thoracic spine. Facet hypertrophy lower lumbar spine with spondylosis. This includes advanced L4-5 facet hypertrophy with moderate to severe degenerative spinal stenosis and bilateral foraminal stenosis. No worrisome regional bone lesion. IMPRESSION: 1. Evidence of a nonspecific small bowel enteritis. Some segments in the left abdomen are slightly dilated which could indicate evidence of an ileus or a low-grade early small-bowel obstruction. A transitional segment could not be found and there is no mesenteric edema. 2. Uncomplicated sigmoid diverticula. 3.  Asymmetric left renal volume loss.  No stones or hydronephrosis. 4. Aortic atherosclerosis. 5. Mild hepatic steatosis and prominence, mild splenomegaly. The liver is less steatotic than in 2017. 6. Coronary artery calcifications. 7. Acquired spinal stenosis L4-5 with foraminal stenosis. Electronically Signed   By: Telford Nab M.D.   On: 07/03/2021 04:33   DG ABD ACUTE 2+V W 1V CHEST  Result Date: 07/04/2021 CLINICAL DATA:  Upper abdominal pain EXAM: DG ABDOMEN ACUTE WITH 1 VIEW CHEST COMPARISON:  None. FINDINGS:  Nonobstructive bowel gas pattern. No radiopaque calculi or other significant radiographic abnormality is seen. Heart size and mediastinal contours are within normal limits. Both lungs are clear. IMPRESSION: 1. Nonobstructive bowel gas pattern. 2. No acute cardiopulmonary disease. Electronically Signed   By: Yetta Glassman M.D.   On: 07/04/2021 11:21     Subjective: No complaints  Discharge Exam: Vitals:   07/05/21 2052 07/06/21 0535  BP: 121/73 113/64  Pulse: (!) 58 (!) 56  Resp: 20 19  Temp: 98.4 F (36.9 C) 98 F (36.7 C)  SpO2: 95% 97%   Vitals:   07/05/21 1115 07/05/21 1408 07/05/21 2052 07/06/21 0535  BP: 133/79 140/73 121/73 113/64  Pulse: (!) 58 (!) 58 (!) 58 (!) 56  Resp: 14 16 20 19   Temp:  98 F (36.7 C) 98.4 F (36.9 C) 98 F (36.7 C)  TempSrc:  Oral Oral   SpO2: 94% 97% 95% 97%  Weight:      Height:        General: Pt is alert, awake, not in acute distress Cardiovascular: RRR, S1/S2 +, no rubs, no gallops Respiratory: CTA bilaterally, no wheezing, no rhonchi Abdominal: Soft, NT, ND, bowel sounds + Extremities: no edema, no cyanosis    The results of significant diagnostics from this hospitalization (including imaging, microbiology, ancillary and laboratory) are listed below for reference.     Microbiology: Recent Results (from the past 240 hour(s))  Resp Panel by RT-PCR (Flu A&B, Covid) Nasopharyngeal Swab     Status: None   Collection Time: 07/03/21 11:57 AM   Specimen: Nasopharyngeal Swab; Nasopharyngeal(NP) swabs in vial transport medium  Result Value Ref Range Status   SARS Coronavirus 2 by RT PCR NEGATIVE NEGATIVE Final    Comment: (NOTE) SARS-CoV-2 target nucleic acids are NOT DETECTED.  The SARS-CoV-2 RNA is generally detectable in upper respiratory specimens during the acute phase of infection. The lowest concentration of SARS-CoV-2 viral copies this assay can detect is 138 copies/mL. A negative result does not preclude  SARS-Cov-2 infection and should not be used as the sole basis for treatment or other patient management decisions. A negative result may occur with  improper specimen collection/handling, submission of specimen other than nasopharyngeal swab, presence of viral mutation(s) within the areas targeted by this assay, and inadequate number of viral copies(<138 copies/mL). A negative result must be combined with clinical observations, patient history, and epidemiological information. The expected result is Negative.  Fact Sheet for Patients:  EntrepreneurPulse.com.au  Fact Sheet for Healthcare Providers:  IncredibleEmployment.be  This test is no t yet approved or cleared by the [place] FDA and  has been authorized for detection and/or diagnosis of SARS-CoV-2 by FDA under an Emergency Use Authorization (EUA). This EUA will remain  in effect (meaning this test can be used) for the duration of the COVID-19 declaration under Section 564(b)(1) of the Act, 21 U.S.C.section 360bbb-3(b)(1), unless the authorization is terminated  or revoked sooner.       Influenza A by  PCR NEGATIVE NEGATIVE Final   Influenza B by PCR NEGATIVE NEGATIVE Final    Comment: (NOTE) The Xpert Xpress SARS-CoV-2/FLU/RSV plus assay is intended as an aid in the diagnosis of influenza from Nasopharyngeal swab specimens and should not be used as a sole basis for treatment. Nasal washings and aspirates are unacceptable for Xpert Xpress SARS-CoV-2/FLU/RSV testing.  Fact Sheet for Patients: EntrepreneurPulse.com.au  Fact Sheet for Healthcare Providers: IncredibleEmployment.be  This test is not yet approved or cleared by the [place] FDA and has been authorized for detection and/or diagnosis of SARS-CoV-2 by FDA under an Emergency Use Authorization (EUA). This EUA will remain in effect (meaning this test can be used) for the duration of  the COVID-19 declaration under Section 564(b)(1) of the Act, 21 U.S.C. section 360bbb-3(b)(1), unless the authorization is terminated or revoked.  Performed at [facility], [address] [place], [place] [address]      Labs: BNP (last 3 results) Recent Labs    10/08/20 2100  BNP 83.4   Basic Metabolic Panel: Recent Labs  Lab 07/03/21 0209 07/03/21 1141 07/04/21 0513 07/05/21 0440 07/06/21 0501  NA 137 138 139 141 140  K 4.2 3.9 3.8 3.6 3.7  CL 108 106 108 109 106  CO2 20* 21* 25 25 26   GLUCOSE 172* 157* 110* 119* 124*  BUN 17 18 17 12 10   CREATININE 0.91 1.01 0.96 0.84 0.92  CALCIUM 8.8* 8.8* 8.3* 8.5* 8.7*  MG  --   --  2.3 2.1 2.0   Liver Function Tests: Recent Labs  Lab 07/03/21 0209 07/03/21 1141 07/04/21 0513 07/05/21 0440 07/06/21 0501  AST 55* 55* 40 39 42*  ALT 27 32 27 24 27   ALKPHOS 66 67 53 50 58  BILITOT 1.1 1.3* 0.7 0.7 0.7  PROT 7.4 7.3 5.9* 5.8* 6.2*  ALBUMIN 4.4 4.6 3.5 3.5 3.7   Recent Labs  Lab 07/03/21 0209  LIPASE 38   No results for input(s): AMMONIA in the last 168 hours. CBC: Recent Labs  Lab 07/03/21 0209 07/03/21 1141 07/04/21 0513  WBC 10.3 8.9 4.5  NEUTROABS 8.7* 7.1  --   HGB 17.4* 17.1* 14.9  HCT 48.7 48.6 44.2  MCV 91.7 91.0 94.4  PLT 232 231 169   Cardiac Enzymes: No results for input(s): CKTOTAL, CKMB, CKMBINDEX, TROPONINI in the last 168 hours. BNP: Invalid input(s): POCBNP CBG: Recent Labs  Lab 07/05/21 0954 07/05/21 1207 07/05/21 1608 07/05/21 2049 07/06/21 0743  GLUCAP 112* 111* 126* 137* 125*   D-Dimer No results for input(s): DDIMER in the last 72 hours. Hgb A1c Recent Labs    07/03/21 1141  HGBA1C 6.4*   Lipid Profile No results for input(s): CHOL, HDL, LDLCALC, TRIG, CHOLHDL, LDLDIRECT in the last 72 hours. Thyroid function studies No results for input(s): TSH, T4TOTAL, T3FREE, THYROIDAB in the last 72 hours.  Invalid input(s): FREET3 Anemia work up No results for input(s):  VITAMINB12, FOLATE, FERRITIN, TIBC, IRON, RETICCTPCT in the last 72 hours. Urinalysis    Component Value Date/Time   COLORURINE YELLOW 03/11/2016 1749   APPEARANCEUR CLEAR 03/11/2016 1749   LABSPEC 1.028 03/11/2016 1749   PHURINE 5.5 03/11/2016 1749   GLUCOSEU 100 (A) 03/11/2016 1749   HGBUR NEGATIVE 03/11/2016 1749   BILIRUBINUR SMALL (A) 03/11/2016 1749   KETONESUR 15 (A) 03/11/2016 1749   PROTEINUR NEGATIVE 03/11/2016 1749   NITRITE NEGATIVE 03/11/2016 1749   LEUKOCYTESUR TRACE (A) 03/11/2016 1749   Sepsis Labs Invalid input(s): PROCALCITONIN,  WBC,  [place] Microbiology Recent Results (from the past 240 hour(s))  Resp Panel by RT-PCR (Flu A&B, Covid) Nasopharyngeal Swab     Status: None   Collection Time: 07/03/21 11:57 AM   Specimen: Nasopharyngeal Swab; Nasopharyngeal(NP) swabs in vial transport medium  Result Value Ref Range Status   SARS Coronavirus 2 by RT PCR NEGATIVE NEGATIVE Final    Comment: (NOTE) SARS-CoV-2 target nucleic acids are NOT DETECTED.  The SARS-CoV-2 RNA is generally detectable in upper respiratory specimens during the acute phase of infection. The lowest concentration of SARS-CoV-2 viral copies this assay can detect is 138 copies/mL. A negative result does not preclude SARS-Cov-2 infection and should not be used as the sole basis for treatment or other patient management decisions. A negative result may occur with  improper specimen collection/handling, submission of specimen other than nasopharyngeal swab, presence of viral mutation(s) within the areas targeted by this assay, and inadequate number of viral copies(<138 copies/mL). A negative result must be combined with clinical observations, patient history, and epidemiological information. The expected result is Negative.  Fact Sheet for Patients:  EntrepreneurPulse.com.au  Fact Sheet for Healthcare Providers:  IncredibleEmployment.be  This test is no t  yet approved or cleared by the [place] FDA and  has been authorized for detection and/or diagnosis of SARS-CoV-2 by FDA under an Emergency Use Authorization (EUA). This EUA will remain  in effect (meaning this test can be used) for the duration of the COVID-19 declaration under Section 564(b)(1) of the Act, 21 U.S.C.section 360bbb-3(b)(1), unless the authorization is terminated  or revoked sooner.       Influenza A by PCR NEGATIVE NEGATIVE Final   Influenza B by PCR NEGATIVE NEGATIVE Final    Comment: (NOTE) The Xpert Xpress SARS-CoV-2/FLU/RSV plus assay is intended as an aid in the diagnosis of influenza from Nasopharyngeal swab specimens and should not be used as a sole basis for treatment. Nasal washings and aspirates are unacceptable for Xpert Xpress SARS-CoV-2/FLU/RSV testing.  Fact Sheet for Patients: EntrepreneurPulse.com.au  Fact Sheet for Healthcare Providers: IncredibleEmployment.be  This test is not yet approved or cleared by the [place] FDA and has been authorized for detection and/or diagnosis of SARS-CoV-2 by FDA under an Emergency Use Authorization (EUA). This EUA will remain in effect (meaning this test can be used) for the duration of the COVID-19 declaration under Section 564(b)(1) of the Act, 21 U.S.C. section 360bbb-3(b)(1), unless the authorization is terminated or revoked.  Performed at [facility], [address], [place], [place] [address]     SIGNED:   Charlynne Cousins, MD  [facility] 07/06/2021, 8:23 AM Pager   If 7PM-7AM, please contact night-coverage [email or web address] Password [email or web address]

## 2021-07-06 NOTE — Telephone Encounter (Signed)
Pt called to say Dr Gala Romney did an EGD on him yesterday and told him to call the office for [place] to schedule him a "eat study" (gastric emptying). He is asking for it to be scheduled on Monday or Tuesday next week after lunch. [phone]

## 2021-07-08 ENCOUNTER — Telehealth: Payer: Self-pay | Admitting: Gastroenterology

## 2021-07-08 NOTE — Telephone Encounter (Signed)
Jerome Shepherd, we need to arrange for patient to have hospital follow-up in 3-4 weeks, preferably with Roseanne Kaufman, NP, but can arrange with any APP.

## 2021-07-09 ENCOUNTER — Encounter (HOSPITAL_COMMUNITY): Payer: Self-pay | Admitting: Internal Medicine

## 2021-07-09 ENCOUNTER — Encounter: Payer: Self-pay | Admitting: Internal Medicine

## 2021-07-09 NOTE — Addendum Note (Signed)
Addended by: Cheron Every on: 07/09/2021 07:39 AM   Modules accepted: Orders

## 2021-07-09 NOTE — Telephone Encounter (Signed)
Called nuc med to schedule, no answer. WCB

## 2021-07-09 NOTE — Telephone Encounter (Signed)
Called pt. GES scheduled for 1/18, arrival 8:00am, no stomach meds after midnight and nop midnight. He voiced understanding

## 2021-07-10 ENCOUNTER — Ambulatory Visit: Payer: BC Managed Care – PPO

## 2021-07-10 ENCOUNTER — Other Ambulatory Visit: Payer: Self-pay

## 2021-07-10 ENCOUNTER — Encounter: Payer: Self-pay | Admitting: Podiatry

## 2021-07-10 ENCOUNTER — Ambulatory Visit: Payer: BC Managed Care – PPO | Admitting: Podiatry

## 2021-07-10 DIAGNOSIS — D2371 Other benign neoplasm of skin of right lower limb, including hip: Secondary | ICD-10-CM

## 2021-07-10 DIAGNOSIS — M7751 Other enthesopathy of right foot: Secondary | ICD-10-CM | POA: Diagnosis not present

## 2021-07-10 MED ORDER — DEXAMETHASONE SODIUM PHOSPHATE 120 MG/30ML IJ SOLN
2.0000 mg | Freq: Once | INTRAMUSCULAR | Status: AC
  Administered 2021-07-10: 2 mg via INTRA_ARTICULAR

## 2021-07-10 NOTE — Progress Notes (Signed)
Subjective:  Patient ID: Jerome Shepherd, male    DOB: [birth year],  MRN: [id number] HPI Chief Complaint  Patient presents with   Foot Pain    Sub 5th MPJ right - small, callused area x several months, tender walking, sometimes left hurts   New Patient (Initial Visit)    Est pt 2016    56 y.o. male presents with the above complaint.   ROS: Denies fever chills nausea vomiting muscle aches pains calf pain back pain chest pain shortness of breath.  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 PCI 09/2020.   Constipation    Coronary vasospasm (HCC)    Essential hypertension    GERD (gastroesophageal reflux disease)    Hyperlipidemia    Type 2 diabetes mellitus ([place])    Past Surgical History:  Procedure Laterality Date   BIOPSY  02/19/2018   Procedure: BIOPSY;  Surgeon: Daneil Dolin, MD;  [facility];  Service: Endoscopy;;  esophagus   BIOPSY  07/05/2021   Procedure: BIOPSY;  Surgeon: Daneil Dolin, MD;  [facility];  Service: Endoscopy;;   CARDIAC CATHETERIZATION  2013   non obstructive CAD  ~20%   CHOLECYSTECTOMY N/A 04/01/2014   Procedure: LAPAROSCOPIC CHOLECYSTECTOMY;  Surgeon: Jamesetta So, MD;  [facility]: [facility];  Service: General;  Laterality: N/A;   COLONOSCOPY WITH PROPOFOL N/A 08/22/2016   Procedure: COLONOSCOPY WITH PROPOFOL;  Surgeon: Daneil Dolin, MD;  [facility];  Service: Endoscopy;  Laterality: N/A;  730    CORONARY ATHERECTOMY N/A 10/05/2020   Procedure: CORONARY ATHERECTOMY;  Surgeon: Troy Sine, MD;  Location: [place] [facility];  Service: Cardiovascular;  Laterality: N/A;   ESOPHAGOGASTRODUODENOSCOPY N/A 01/10/2014   Dr. Gala Romney: antral erosions, path with chronic inactive gastritis. Empiric dilation with 56-F dilation   ESOPHAGOGASTRODUODENOSCOPY (EGD) WITH PROPOFOL N/A 02/16/2016   Dr.  Gala Romney: small hiatal hernia, normal esopagus s/p empiric dilation   ESOPHAGOGASTRODUODENOSCOPY (EGD) WITH PROPOFOL N/A 02/19/2018   Procedure: ESOPHAGOGASTRODUODENOSCOPY (EGD) WITH PROPOFOL;  Surgeon: Daneil Dolin, MD;  [facility];  Service: Endoscopy;  Laterality: N/A;  10:15am   ESOPHAGOGASTRODUODENOSCOPY (EGD) WITH PROPOFOL N/A 07/05/2021   Procedure: ESOPHAGOGASTRODUODENOSCOPY (EGD) WITH PROPOFOL;  Surgeon: Daneil Dolin, MD;  [facility];  Service: Endoscopy;  Laterality: N/A;  Patient is on   KNEE ARTHROSCOPY Right 2002   LEFT HEART CATH N/A 08/18/2011   Procedure: LEFT HEART CATH;  Surgeon: Lorretta Harp, MD;  Location: [facility];  Service: Cardiovascular;  Laterality: N/A;   LEFT HEART CATH AND CORONARY ANGIOGRAPHY N/A 10/04/2020   Procedure: LEFT HEART CATH AND CORONARY ANGIOGRAPHY;  Surgeon: Troy Sine, MD;  Location: [place] [facility];  Service: Cardiovascular;  Laterality: N/A;   LIVER BIOPSY N/A 04/01/2014   mild fibrosis   MALONEY DILATION N/A 01/10/2014   Procedure: Venia Minks DILATION;  Surgeon: Daneil Dolin, MD;  [facility];  Service: Endoscopy;  Laterality: N/A;   MALONEY DILATION N/A 02/16/2016   Procedure: Venia Minks DILATION;  Surgeon: Daneil Dolin, MD;  [facility];  Service: Endoscopy;  Laterality: N/A;   MALONEY DILATION N/A 02/19/2018   Procedure: Venia Minks DILATION;  Surgeon: Daneil Dolin, MD;  [facility];  Service: Endoscopy;  Laterality:  N/A;   POLYPECTOMY  08/22/2016   Procedure: POLYPECTOMY;  Surgeon: Daneil Dolin, MD;  [facility];  Service: Endoscopy;;   SAVORY DILATION N/A 01/10/2014   Procedure: Azzie Almas DILATION;  Surgeon: Daneil Dolin, MD;  [facility];  Service: Endoscopy;  Laterality: N/A;   TEMPORARY PACEMAKER N/A 10/05/2020   Procedure: TEMPORARY PACEMAKER;  Surgeon: Troy Sine, MD;  Location: [place] [facility];  Service: Cardiovascular;  Laterality: N/A;     Current Outpatient Medications:    amLODipine (NORVASC) 5 MG tablet, Take 1 tablet by mouth daily., Disp: , Rfl:    Ascorbic Acid (VITAMIN C) 1000 MG tablet, Take 1,000 mg by mouth in the morning and at bedtime., Disp: , Rfl:    aspirin EC 81 MG tablet, Take 1 tablet (81 mg total) by mouth daily., Disp: , Rfl:    atorvastatin (LIPITOR) 40 MG tablet, Take 1 tablet (40 mg total) by mouth daily., Disp: 90 tablet, Rfl: 3   carvedilol (COREG) 3.125 MG tablet, Take 1 tablet (3.125 mg total) by mouth 2 (two) times daily with a meal., Disp: 60 tablet, Rfl: 11   cholecalciferol (VITAMIN D3) 25 MCG (1000 UNIT) tablet, Take 1,000 Units by mouth daily., Disp: , Rfl:    clopidogrel (PLAVIX) 75 MG tablet, Take 1 tablet (75 mg total) by mouth daily., Disp: 90 tablet, Rfl: 3   dapagliflozin propanediol (FARXIGA) 10 MG TABS tablet, Take 1 tablet (10 mg total) by mouth daily before breakfast., Disp: 30 tablet, Rfl: 11   Dulaglutide 1.5 MG/0.5ML SOPN, Inject 1.5 mg into the skin every Sunday., Disp: , Rfl:    isosorbide mononitrate (IMDUR) 60 MG 24 hr tablet, TAKE ONE TABLET BY MOUTH EVERY DAY PATIENT NEEDS OFFICE VISIT (Patient taking differently: Take 60 mg by mouth daily.), Disp: 7 tablet, Rfl: 0   loratadine-pseudoephedrine (CLARITIN-D 24-HOUR) 10-240 MG 24 hr tablet, Take 1 tablet by mouth daily as needed for allergies., Disp: , Rfl:    losartan (COZAAR) 50 MG tablet, TAKE 1 TABLET DAILY (Patient taking differently: Take 50 mg by mouth daily.), Disp: 90 tablet, Rfl: 3   Magnesium 500 MG TABS, Take 500 mg by mouth daily as needed (for leg cramps). , Disp: , Rfl:    metFORMIN (GLUCOPHAGE) 500 MG tablet, Take 500 mg by mouth 2 (two) times daily. , Disp: , Rfl:    metoCLOPramide (REGLAN) 10 MG tablet, Take 1 tablet (10 mg total) by mouth every 6 (six) hours as needed for nausea., Disp: 30 tablet, Rfl: 0   nitroGLYCERIN (NITROSTAT) 0.4 MG SL tablet, Place 1 tablet (0.4 mg total) under the tongue every 5 (five)  minutes as needed for chest pain., Disp: 25 tablet, Rfl: 3   pantoprazole (PROTONIX) 40 MG tablet, Take 1 tablet (40 mg total) by mouth 2 (two) times daily., Disp: 60 tablet, Rfl: 11  Allergies  Allergen Reactions   Codeine Nausea Only   Diamox [Acetazolamide] Nausea Only    dizziness   Sulfa Antibiotics Nausea Only   Review of Systems Objective:  There were no vitals filed for this visit.  General: Well developed, nourished, in no acute distress, alert and oriented x3   Dermatological: Skin is warm, dry and supple bilateral. Nails x 10 are well maintained; remaining integument appears unremarkable at this time. There are no open sores, no preulcerative lesions, no rash or signs of infection present.  Benign skin lesions of fifth metatarsal head of the right foot.  Dry xerotic  skin.  Vascular: Dorsalis Pedis artery and Posterior Tibial artery pedal pulses are 2/4 bilateral with immedate capillary fill time. Pedal hair growth present. No varicosities and no lower extremity edema present bilateral.   Neruologic: Grossly intact via light touch bilateral. Vibratory intact via tuning fork bilateral. Protective threshold with Semmes Wienstein monofilament intact to all pedal sites bilateral. Patellar and Achilles deep tendon reflexes 2+ bilateral. No Babinski or clonus noted bilateral.   Musculoskeletal: No gross boney pedal deformities bilateral. No pain, crepitus, or limitation noted with foot and ankle range of motion bilateral. Muscular strength 5/5 in all groups tested bilateral.  Palpable bursitis with mild erythema overlying the fifth metatarsal base plantarly.  Area of fluctuance beneath the fifth metatarsal head with an overlying reactive hyperkeratotic lesion most likely secondary to pressure.  Gait: Unassisted, Nonantalgic.    Radiographs:  None taken  Assessment & Plan:   Assessment: Bursitis capsulitis of the fifth metatarsal head right foot.  Benign skin lesion.  Plan:  Injected 2 mg of dexamethasone into the bursitis.  Also debrided benign skin lesion.  He will follow-up with me as needed we discussed appropriate shoe gear and width.     Brody Kump T. [place], [place]

## 2021-07-11 ENCOUNTER — Encounter (HOSPITAL_COMMUNITY)
Admission: RE | Admit: 2021-07-11 | Discharge: 2021-07-11 | Disposition: A | Discharge status: Home / Self Care | Payer: BC Managed Care – PPO | Source: Ambulatory Visit | Attending: Internal Medicine | Admitting: Internal Medicine

## 2021-07-11 ENCOUNTER — Encounter (HOSPITAL_COMMUNITY): Payer: Self-pay

## 2021-07-11 DIAGNOSIS — R112 Nausea with vomiting, unspecified: Secondary | ICD-10-CM | POA: Insufficient documentation

## 2021-07-11 DIAGNOSIS — R1010 Upper abdominal pain, unspecified: Secondary | ICD-10-CM | POA: Diagnosis present

## 2021-07-11 MED ORDER — TECHNETIUM TC 99M SULFUR COLLOID
2.0000 | Freq: Once | INTRAVENOUS | Status: AC | PRN
  Administered 2021-07-11: 2.2 via ORAL

## 2021-07-16 ENCOUNTER — Other Ambulatory Visit: Payer: Self-pay | Admitting: Cardiology

## 2021-08-01 ENCOUNTER — Telehealth: Payer: Self-pay | Admitting: Cardiology

## 2021-08-01 MED ORDER — NITROGLYCERIN 0.4 MG SL SUBL: 0.4000 mg | SUBLINGUAL_TABLET | SUBLINGUAL | 3 refills | Status: DC | PRN

## 2021-08-01 NOTE — Telephone Encounter (Signed)
Pt c/o of Chest Pain: STAT if CP now or developed within 24 hours  1. Are you having CP right now? yes  2. Are you experiencing any other symptoms (ex. SOB, nausea, vomiting, sweating)? Nausea  3. How long have you been experiencing CP? Yesterday   4. Is your CP continuous or coming and going? Continuous  5. Have you taken Nitroglycerin? No (he dont have any) ? Patient called to say that he got injection in his back for a disc. He stated that he start having chest pain on yesterday since having it done. The pain is right around his heart.

## 2021-08-01 NOTE — Telephone Encounter (Signed)
Reports after steroid injection for his back on yesterday, he developed nausea on the way home. Reports after getting home, he took his nausea medication and nausea resolved. Reports walking to  the kitchen and felt nausea, hot and clammy feeling. Reports last night he started having a constant, dull pain around his heart area in chest rated 4/10 that has continued. Has not used nitroglycerin and says he doesn't have any on hand. Denies dizziness and SOB.  Advised that nitroglycerin refill has been sent to his pharmacy [facility]) Gave first available appointment to see Domenic Polite 08/06/21 @8 :40 am Advised if symptoms got worse to go to the ED for an evaluation Verbalized understanding of plan

## 2021-08-05 ENCOUNTER — Encounter: Payer: Self-pay | Admitting: Cardiology

## 2021-08-05 NOTE — Progress Notes (Signed)
Cardiology Office Note  Date: 08/06/2021   ID: Jerome Shepherd, DOB Feb 03, 1966, MRN [id number]  PCP:  Curlene Labrum, MD  Cardiologist:  Rozann Lesches, MD Electrophysiologist:  None   Chief Complaint  Patient presents with   Cardiac follow-up    History of Present Illness: Jerome Shepherd is a 56 y.o. male last seen in November 2022 by Mr. Leonides Sake NP.  He is here for a follow-up visit.  He reports having recent episodes of chest pain, possibly anginal, but also has reflux symptoms.  He has noticed this after having a recent right hip/spinal steroid injection, also using Sudafed for sinus congestion.  He was hospitalized in January with partial small bowel obstruction, abdominal and pelvic CTA results are noted below.  He was evaluated by GI.  EGD showed single distal esophageal erosion and erythema of the gastric mucosa with scattered duodenal erosions.  Subsequent gastric emptying study was normal.  I reviewed his cardiac medications which are stable and outlined below.  We did discuss uptitrating Coreg for additional antianginal effect.  ECG reviewed today and stable.  Plan is to continue Plavix through April.  Past Medical History:  Diagnosis Date   Antral erosion    EGD 12/2013   Arthritis    CAD (coronary artery disease)    DES to prox/mid RCA 09/2020 with medically managed moderate proximal LAD stenosis and occluded AV circumflex   Coronary vasospasm ([place])    Essential hypertension    GERD (gastroesophageal reflux disease)    Hyperlipidemia    Type 2 diabetes mellitus ([place])     Past Surgical History:  Procedure Laterality Date   BIOPSY  02/19/2018   Procedure: BIOPSY;  Surgeon: Daneil Dolin, MD;  [facility];  Service: Endoscopy;;  esophagus   BIOPSY  07/05/2021   Procedure: BIOPSY;  Surgeon: Daneil Dolin, MD;  [facility];  Service: Endoscopy;;   CARDIAC CATHETERIZATION  2013   non obstructive CAD  ~20%   CHOLECYSTECTOMY N/A 04/01/2014    Procedure: LAPAROSCOPIC CHOLECYSTECTOMY;  Surgeon: Jamesetta So, MD;  [facility];  Service: General;  Laterality: N/A;   COLONOSCOPY WITH PROPOFOL N/A 08/22/2016   Procedure: COLONOSCOPY WITH PROPOFOL;  Surgeon: Daneil Dolin, MD;  [facility];  Service: Endoscopy;  Laterality: N/A;  730    CORONARY ATHERECTOMY N/A 10/05/2020   Procedure: CORONARY ATHERECTOMY;  Surgeon: Troy Sine, MD;  Location: [place] [facility];  Service: Cardiovascular;  Laterality: N/A;   ESOPHAGOGASTRODUODENOSCOPY N/A 01/10/2014   Dr. Gala Romney: antral erosions, path with chronic inactive gastritis. Empiric dilation with 56-F dilation   ESOPHAGOGASTRODUODENOSCOPY (EGD) WITH PROPOFOL N/A 02/16/2016   Dr. Gala Romney: small hiatal hernia, normal esopagus s/p empiric dilation   ESOPHAGOGASTRODUODENOSCOPY (EGD) WITH PROPOFOL N/A 02/19/2018   Procedure: ESOPHAGOGASTRODUODENOSCOPY (EGD) WITH PROPOFOL;  Surgeon: Daneil Dolin, MD;  [facility];  Service: Endoscopy;  Laterality: N/A;  10:15am   ESOPHAGOGASTRODUODENOSCOPY (EGD) WITH PROPOFOL N/A 07/05/2021   Procedure: ESOPHAGOGASTRODUODENOSCOPY (EGD) WITH PROPOFOL;  Surgeon: Daneil Dolin, MD;  [facility];  Service: Endoscopy;  Laterality: N/A;  Patient is on   KNEE ARTHROSCOPY Right 2002   LEFT HEART CATH N/A 08/18/2011   Procedure: LEFT HEART CATH;  Surgeon: Lorretta Harp, MD;  Location: [facility];  Service: Cardiovascular;  Laterality: N/A;   LEFT HEART CATH AND CORONARY ANGIOGRAPHY N/A 10/04/2020   Procedure: LEFT HEART CATH AND CORONARY ANGIOGRAPHY;  Surgeon: Troy Sine, MD;  Location: [place] [facility];  Service: Cardiovascular;  Laterality: N/A;   LIVER BIOPSY N/A 04/01/2014   mild fibrosis   MALONEY DILATION N/A 01/10/2014   Procedure: Venia Minks DILATION;  Surgeon: Daneil Dolin, MD;  [facility];  Service: Endoscopy;  Laterality: N/A;   MALONEY DILATION N/A 02/16/2016   Procedure: Venia Minks DILATION;  Surgeon:  Daneil Dolin, MD;  [facility];  Service: Endoscopy;  Laterality: N/A;   MALONEY DILATION N/A 02/19/2018   Procedure: Venia Minks DILATION;  Surgeon: Daneil Dolin, MD;  [facility];  Service: Endoscopy;  Laterality: N/A;   POLYPECTOMY  08/22/2016   Procedure: POLYPECTOMY;  Surgeon: Daneil Dolin, MD;  [facility];  Service: Endoscopy;;   SAVORY DILATION N/A 01/10/2014   Procedure: Azzie Almas DILATION;  Surgeon: Daneil Dolin, MD;  [facility];  Service: Endoscopy;  Laterality: N/A;   TEMPORARY PACEMAKER N/A 10/05/2020   Procedure: TEMPORARY PACEMAKER;  Surgeon: Troy Sine, MD;  Location: [place] [facility];  Service: Cardiovascular;  Laterality: N/A;    Current Outpatient Medications  Medication Sig Dispense Refill   amLODipine (NORVASC) 5 MG tablet Take 1 tablet by mouth daily.     Ascorbic Acid (VITAMIN C) 1000 MG tablet Take 1,000 mg by mouth in the morning and at bedtime.     aspirin EC 81 MG tablet Take 1 tablet (81 mg total) by mouth daily.     atorvastatin (LIPITOR) 40 MG tablet Take 1 tablet (40 mg total) by mouth daily. 90 tablet 3   carvedilol (COREG) 6.25 MG tablet Take 1 tablet (6.25 mg total) by mouth 2 (two) times daily. 180 tablet 3   cholecalciferol (VITAMIN D3) 25 MCG (1000 UNIT) tablet Take 1,000 Units by mouth daily.     clopidogrel (PLAVIX) 75 MG tablet Take 1 tablet (75 mg total) by mouth daily. 90 tablet 3   dapagliflozin propanediol (FARXIGA) 10 MG TABS tablet Take 1 tablet (10 mg total) by mouth daily before breakfast. 30 tablet 11   Dulaglutide 1.5 MG/0.5ML SOPN Inject 1.5 mg into the skin every Sunday.     isosorbide mononitrate (IMDUR) 60 MG 24 hr tablet TAKE ONE TABLET BY MOUTH EVERY DAY PATIENT NEEDS OFFICE VISIT (Patient taking differently: Take 60 mg by mouth daily.) 7 tablet 0   loratadine-pseudoephedrine (CLARITIN-D 24-HOUR) 10-240 MG 24 hr tablet Take 1 tablet by mouth daily as needed for allergies.     losartan  (COZAAR) 50 MG tablet TAKE 1 TABLET DAILY 90 tablet 3   Magnesium 500 MG TABS Take 500 mg by mouth daily as needed (for leg cramps).      metFORMIN (GLUCOPHAGE) 500 MG tablet Take 500 mg by mouth 2 (two) times daily.      metoCLOPramide (REGLAN) 10 MG tablet Take 1 tablet (10 mg total) by mouth every 6 (six) hours as needed for nausea. 30 tablet 0   nitroGLYCERIN (NITROSTAT) 0.4 MG SL tablet Place 1 tablet (0.4 mg total) under the tongue every 5 (five) minutes x 3 doses as needed for chest pain (if no relief after 2nd dose, proceed to ED or call 911). 25 tablet 3   pantoprazole (PROTONIX) 40 MG tablet Take 1 tablet (40 mg total) by mouth 2 (two) times daily. 60 tablet 11   No current facility-administered medications for this visit.   Allergies:  Codeine, Diamox [acetazolamide], and Sulfa antibiotics   ROS: No palpitations  or syncope.  Physical Exam: VS:  BP 128/90    Pulse 88    Ht 6' 3"  (1.905 m)    Wt 269 lb 12.8 oz (122.4 kg)    SpO2 96%    BMI 33.72 kg/m , BMI Body mass index is 33.72 kg/m.  Wt Readings from Last 3 Encounters:  08/06/21 269 lb 12.8 oz (122.4 kg)  07/03/21 261 lb 7.5 oz (118.6 kg)  07/03/21 263 lb 3.2 oz (119.4 kg)    General: Patient appears comfortable at rest. HEENT: Conjunctiva and lids normal, wearing a mask. Neck: Supple, no elevated JVP or carotid bruits, no thyromegaly. Lungs: Clear to auscultation, nonlabored breathing at rest. Cardiac: Regular rate and rhythm, no S3 or significant systolic murmur, no pericardial rub. Extremities: No pitting edema.  ECG:  An ECG dated 07/03/2021 was personally reviewed today and demonstrated:  Sinus with anteroseptal Q waves, borderline low voltage, nonspecific T wave changes.  Recent Labwork: 10/08/2020: B Natriuretic Peptide 24.1 07/04/2021: Hemoglobin 14.9; Platelets 169 07/06/2021: ALT 27; AST 42; BUN 10; Creatinine, Ser 0.92; Magnesium 2.0; Potassium 3.7; Sodium 140     Component Value Date/Time   CHOL 118  10/06/2020 0352   TRIG 205 (H) 10/06/2020 0352   HDL 31 (L) 10/06/2020 0352   CHOLHDL 3.8 10/06/2020 0352   VLDL 41 (H) 10/06/2020 0352   LDLCALC 46 10/06/2020 0352    Other Studies Reviewed Today:  Echocardiogram 10/04/2020:  1. Left ventricular ejection fraction, by estimation, is 50 to 55%. The  left ventricle has low normal function. The left ventricle has no regional  wall motion abnormalities. There is mild concentric left ventricular  hypertrophy. Left ventricular  diastolic parameters are consistent with Grade II diastolic dysfunction  (pseudonormalization).   2. Right ventricular systolic function is normal. The right ventricular  size is normal. Tricuspid regurgitation signal is inadequate for assessing  PA pressure.   3. The mitral valve is normal in structure. Trivial mitral valve  regurgitation.   4. The aortic valve is tricuspid. Aortic valve regurgitation is trivial.  No aortic stenosis is present.    Abdominal and pelvic CTA 07/03/2021: IMPRESSION: 1. Evidence of a nonspecific small bowel enteritis. Some segments in the left abdomen are slightly dilated which could indicate evidence of an ileus or a low-grade early small-bowel obstruction. A transitional segment could not be found and there is no mesenteric edema. 2. Uncomplicated sigmoid diverticula. 3. Asymmetric left renal volume loss.  No stones or hydronephrosis. 4. Aortic atherosclerosis. 5. Mild hepatic steatosis and prominence, mild splenomegaly. The liver is less steatotic than in 2017. 6. Coronary artery calcifications. 7. Acquired spinal stenosis L4-5 with foraminal stenosis.  Assessment and Plan:  1.  CAD status post atherectomy and DES intervention to the RCA in April 2022 with residual moderate LAD disease being managed medically.  ECG reviewed and stable.  Continue aspirin, Plavix (through April 2023), Imdur, Lipitor, Farxiga, Norvasc, Cozaar, and increase Coreg to 6.25 mg twice daily.  As needed  nitroglycerin is available.  2.  Mixed hyperlipidemia, continues on Lipitor.  Last LDL 46.  3.  Essential hypertension, continue with current regimen, Coreg being increased as discussed above.  Medication Adjustments/Labs and Tests Ordered: Current medicines are reviewed at length with the patient today.  Concerns regarding medicines are outlined above.   Tests Ordered: Orders Placed This Encounter  Procedures   EKG 12-Lead    Medication Changes: Meds ordered this encounter  Medications   carvedilol (COREG) 6.25 MG tablet  Sig: Take 1 tablet (6.25 mg total) by mouth 2 (two) times daily.    Dispense:  180 tablet    Refill:  3    08/06/21 dose increased to 6.25 mg bid    Disposition:  Follow up  3 months.  Signed, Satira Sark, MD, [facility] 08/06/2021 9:12 AM    [place] [facility] [place], [place], [place] [address] Phone: [phone]; Fax: [phone]

## 2021-08-06 ENCOUNTER — Ambulatory Visit: Payer: BC Managed Care – PPO | Admitting: Cardiology

## 2021-08-06 ENCOUNTER — Encounter: Payer: Self-pay | Admitting: Cardiology

## 2021-08-06 VITALS — BP 128/90 | HR 88 | Ht 75.0 in | Wt 269.8 lb

## 2021-08-06 DIAGNOSIS — I10 Essential (primary) hypertension: Secondary | ICD-10-CM

## 2021-08-06 DIAGNOSIS — I25119 Atherosclerotic heart disease of native coronary artery with unspecified angina pectoris: Secondary | ICD-10-CM

## 2021-08-06 DIAGNOSIS — E782 Mixed hyperlipidemia: Secondary | ICD-10-CM

## 2021-08-06 MED ORDER — CARVEDILOL 6.25 MG PO TABS: 6.2500 mg | ORAL_TABLET | Freq: Two times a day (BID) | ORAL | 3 refills | Status: DC

## 2021-08-06 NOTE — Patient Instructions (Signed)
Medication Instructions:  INCREASE Coreg to 6.25 mg twice a day   Labwork: None today  Testing/Procedures: None today  Follow-Up: 3 months  Any Other Special Instructions Will Be Listed Below (If Applicable).  If you need a refill on your cardiac medications before your next appointment, please call your pharmacy.

## 2021-09-12 ENCOUNTER — Ambulatory Visit (INDEPENDENT_AMBULATORY_CARE_PROVIDER_SITE_OTHER): Payer: BC Managed Care – PPO | Admitting: Gastroenterology

## 2021-09-12 ENCOUNTER — Other Ambulatory Visit: Payer: Self-pay

## 2021-09-12 ENCOUNTER — Encounter: Payer: Self-pay | Admitting: Gastroenterology

## 2021-09-12 VITALS — BP 120/72 | HR 82 | Temp 96.9°F | Ht 75.0 in | Wt 278.8 lb

## 2021-09-12 DIAGNOSIS — K5900 Constipation, unspecified: Secondary | ICD-10-CM | POA: Diagnosis not present

## 2021-09-12 DIAGNOSIS — K7581 Nonalcoholic steatohepatitis (NASH): Secondary | ICD-10-CM

## 2021-09-12 DIAGNOSIS — R112 Nausea with vomiting, unspecified: Secondary | ICD-10-CM

## 2021-09-12 NOTE — Patient Instructions (Addendum)
Continue to stay on top of your bowel movements, not letting yourself go more than 48 hours without a stool. Miralax one capful daily would be helpful to keep things flowing without causing urgency, diarrhea.  ?We will try to get copy of that 24 hour urinary copper test from [facility]. Once reviewed we will be in touch to discuss which labs are still needed.  ?Would recommend limiting jalapenos given redness in the stomach on endoscopy. ?Continue pantoprazole twice daily before a meal. ?

## 2021-09-13 ENCOUNTER — Encounter: Payer: Self-pay | Admitting: Gastroenterology

## 2021-09-13 NOTE — Progress Notes (Signed)
? ? ? ?GI Office Note   ? ?Referring Provider: Curlene Labrum, MD ?Primary Care Physician:  Curlene Labrum, MD  ?Primary Gastroenterologist: Garfield Cornea, MD ? ? ?Chief Complaint  ? ?Chief Complaint  ?Patient presents with  ? Follow-up  ?  Hospital follow up, no current issues.   ? ? ?History of Present Illness  ? ?Jerome Shepherd is a 56 y.o. male presenting today for hospital follow-up.  Patient has a history of [name], GERD, chronic epigastric pain.  He was seen in the ED on January 10, brought in for an urgent office visit same day, seen by Roseanne Kaufman, NP.  CT abdomen pelvis with contrast noted mild gastric fluid distention, nonspecific small bowel enteritis, some segments in the left abdomen slightly dilated which could indicate ileus or low-grade early small bowel obstruction.  No obvious transitional segment.  In the office he endorsed chronic history of epigastric pain, early satiety with intermittent vomiting, relief after laxatives.  He had developed several month history of sensation of food sticking in the upper abdomen associated with pain and nausea.  Relief with prune-lax, resulting in significant stooling.  Could feel acid building up in his esophagus, notable abdominal distention in stools backed up. Previously had normal manometry study in March 2020 through [facility].  Normal GES (2017) as well. ? ?During his office visit, it was noted that he developed vomiting after being discharged from the ED that morning.  He appeared acutely ill.  It was recommended that he go back to the ED for admission.  Once symptoms improve, consider dedicated imaging of the small bowel, if considering capsule would recommend agile first. ? ?Patient had documented weight loss of 40 pounds since April 2022.  During admission he was down to 260 pounds.  Today he is 278 pounds.  Work-up included EGD showed single esophageal erosion, intense erythema of the gastric mucosa, subtle scattered duodenal erosions.  On biopsies  he had reactive gastropathy but no H. pylori.  Duodenal biopsies with focal increased intraepithelial lymphocytes but somewhat nonspecific, no villous blunting.  Correlate for serologies if clinically indicated.  He completed gastric emptying study in January which was normal. ? ?Patient states he never filled the Reglan prescription.  He has been doing well for the past 2 months.  He notes that off and on for years he has had symptoms as described but this last episode was the most severe.  Typically he can stop the episodes by taking prune lax if he has not had a bowel movement after 48 hours.  Often he can have a delayed response so typically if he has a "function" he will start prune lax 2 days before to try to get cleaned out first.  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  When he has the urge for bowel movement, he has significant urgency especially since his cholecystectomy.  He denies melena or rectal bleeding.  He states in the past he has utilized [place] but he got out of the habit.  Patient states he discussed with Dr. Gala Romney his daily consumption of jalapenos as he was concerned this may be contributing to his symptoms.  Patient states he was told he was not likely eating enough of them to cause the symptoms.  Patient has avoided jalapeno since January out of fear however.  So far he has not had any  recurrent episodes. ? ?Patient believes vomiting during ED visit was related to GI cocktail. ? ?Discussed previous liver work-up.  Initially elastography in 2015 was suggestive of fibrosis score of F4.  Subsequently underwent liver biopsy showing NASH with grade 1 fibrosis.  He has been evaluated at [facility] in 2020, ceruloplasmin was low at 17.9 then.  They ordered a 24-hour urinary copper, patient believes he completed it but I cannot see the results.  We have requested those  records. ? ?  ?CT abdomen pelvis with contrast July 03, 2021: ?IMPRESSION: ?1. Evidence of a nonspecific small bowel enteritis. Some segments in ?the left abdomen are slightly dilated which could indicate evidence ?of an ileus or a low-grade early small-bowel obstruction. A ?transitional segment could not be found and there is no mesenteric ?edema. ?2. Uncomplicated sigmoid diverticula. ?3. Asymmetric left renal volume loss.  No stones or hydronephrosis. ?4. Aortic atherosclerosis. ?5. Mild hepatic steatosis and prominence, mild splenomegaly. The ?liver is less steatotic than in 2017. ?6. Coronary artery calcifications. ?7. Acquired spinal stenosis L4-5 with foraminal stenosis. ? ?Gastric emptying study January 2023 normal. ? ?EGD January 2023: ?-Single distal esophageal erosion/excoriation. Intense erythema of the gastric mucosa, ?nonspecific; query sequelae of vomiting. Patent pylorus. Subtle scattered duodenal ?erosions of uncertain significance, status post gastric and duodenal biopsy. ?-Duodenal biopsies with focal increased intraepithelial lymphocytes, nonspecific.  No acute inflammation or villous blunting identified.  Correlate with celiac serologies if clinically indicated. ?-Reactive gastropathy, no H. pylori. ? ?Colonoscopy March 2018: ?-3 mm polyp in the rectum ?-Pathology showed hyperplastic. ?-Next colonoscopy in 10 years. ? ?EGD August 2019: ?-Normal esophagus status post dilation ?-tiny hiatal hernia ?-Otherwise normal ? ?Medications  ? ?Current Outpatient Medications  ?Medication Sig Dispense Refill  ? amLODipine (NORVASC) 5 MG tablet Take 1 tablet by mouth daily.    ? Ascorbic Acid (VITAMIN C) 1000 MG tablet Take 1,000 mg by mouth in the morning and at bedtime.    ? aspirin EC 81 MG tablet Take 1 tablet (81 mg total) by mouth daily.    ? atorvastatin (LIPITOR) 40 MG tablet Take 1 tablet (40 mg total) by mouth daily. 90 tablet 3  ? carvedilol (COREG) 6.25 MG tablet Take 1 tablet (6.25 mg total)  by mouth 2 (two) times daily. 180 tablet 3  ? cholecalciferol (VITAMIN D3) 25 MCG (1000 UNIT) tablet Take 1,000 Units by mouth daily.    ? clopidogrel (PLAVIX) 75 MG tablet Take 1 tablet (75 mg total) by mouth daily. 90 tablet 3  ? dapagliflozin propanediol (FARXIGA) 10 MG TABS tablet Take 1 tablet (10 mg total) by mouth daily before breakfast. 30 tablet 11  ? Dulaglutide 1.5 MG/0.5ML SOPN Inject 1.5 mg into the skin every Sunday.    ? isosorbide mononitrate (IMDUR) 60 MG 24 hr tablet TAKE ONE TABLET BY MOUTH EVERY DAY PATIENT NEEDS OFFICE VISIT (Patient taking differently: Take 60 mg by mouth daily.) 7 tablet 0  ? loratadine-pseudoephedrine (CLARITIN-D 24-HOUR) 10-240 MG 24 hr tablet Take 1 tablet by mouth daily as needed for allergies.    ? losartan (COZAAR) 50 MG tablet TAKE 1 TABLET DAILY 90 tablet 3  ? Magnesium 500 MG TABS Take 500 mg by mouth daily as needed (for leg cramps).     ? metFORMIN (GLUCOPHAGE) 500 MG tablet Take 500 mg by mouth 2 (two) times daily.     ? nitroGLYCERIN (NITROSTAT) 0.4 MG SL tablet Place 1 tablet (0.4 mg total) under the tongue every  5 (five) minutes x 3 doses as needed for chest pain (if no relief after 2nd dose, proceed to ED or call 911). 25 tablet 3  ? pantoprazole (PROTONIX) 40 MG tablet Take 1 tablet (40 mg total) by mouth 2 (two) times daily. 60 tablet 11  ? ?No current facility-administered medications for this visit.  ? ? ?Allergies  ? ?Allergies as of 09/12/2021 - Review Complete 09/12/2021  ?Allergen Reaction Noted  ? Codeine Nausea Only 08/18/2011  ? Diamox [acetazolamide] Nausea Only 09/28/2018  ? Sulfa antibiotics Nausea Only 08/18/2011  ? ?  ?Review of Systems  ? ?General: Negative for anorexia, weight loss, fever, chills, fatigue, weakness. ?ENT: Negative for hoarseness, difficulty swallowing , nasal congestion. ?CV: Negative for chest pain, angina, palpitations, dyspnea on exertion, peripheral edema.  ?Respiratory: Negative for dyspnea at rest, dyspnea on exertion,  cough, sputum, wheezing.  ?GI: See history of present illness. ?GU:  Negative for dysuria, hematuria, urinary incontinence, urinary frequency, nocturnal urination.  ?Endo: Negative for unusual weight change.

## 2021-10-02 ENCOUNTER — Telehealth: Payer: Self-pay | Admitting: Cardiology

## 2021-10-02 MED ORDER — CARVEDILOL 6.25 MG PO TABS: 6.2500 mg | ORAL_TABLET | Freq: Two times a day (BID) | ORAL | 0 refills | Status: DC

## 2021-10-02 MED ORDER — CARVEDILOL 6.25 MG PO TABS: 6.2500 mg | ORAL_TABLET | Freq: Two times a day (BID) | ORAL | 3 refills | Status: DC

## 2021-10-02 NOTE — Telephone Encounter (Signed)
?*  STAT* If patient is at the pharmacy, call can be transferred to refill team. ? ? ?1. Which medications need to be refilled? (please list name of each medication and dose if known)  ?carvedilol (COREG) 6.25 MG tablet ? ?2. Which pharmacy/location (including street and city if local pharmacy) is medication to be sent to? ?[place], [place] ? ?3. Do they need a 30 day or 90 day supply?  ? ?Patient states he will be out of medication tomorrow. ?He states he gets all his medications through [facility] now and he would like to have his most recent Rx transferred to them and a 1 week supply sent to his local [place] if possible. ? ?

## 2021-10-08 ENCOUNTER — Encounter: Payer: Self-pay | Admitting: Internal Medicine

## 2021-11-06 ENCOUNTER — Ambulatory Visit: Payer: BC Managed Care – PPO | Admitting: Cardiology

## 2021-11-06 ENCOUNTER — Encounter: Payer: Self-pay | Admitting: Cardiology

## 2021-11-06 VITALS — BP 120/82 | HR 83 | Ht 75.0 in | Wt 278.8 lb

## 2021-11-06 DIAGNOSIS — I25119 Atherosclerotic heart disease of native coronary artery with unspecified angina pectoris: Secondary | ICD-10-CM | POA: Diagnosis not present

## 2021-11-06 DIAGNOSIS — E782 Mixed hyperlipidemia: Secondary | ICD-10-CM | POA: Diagnosis not present

## 2021-11-06 DIAGNOSIS — I10 Essential (primary) hypertension: Secondary | ICD-10-CM

## 2021-11-06 NOTE — Progress Notes (Signed)
? ? ?Cardiology Office Note ? ?Date: 11/06/2021  ? ?ID: Jerome Shepherd, DOB 04/27/1966, MRN [id number] ? ?PCP:  Curlene Labrum, MD  ?Cardiologist:  Rozann Lesches, MD ?Electrophysiologist:  None  ? ?Chief Complaint  ?Patient presents with  ? Cardiac follow-up  ? ? ?History of Present Illness: ?Jerome Shepherd is a 56 y.o. male last seen in February.  He is here for a follow-up visit.  He does not describe any further angina symptoms on current medical therapy.  No nitroglycerin use. ? ?He is over a year out now from DES intervention and we discussed coming off Plavix at this point, otherwise continuing aspirin along with the remainder of his cardiac regimen. ? ?Past Medical History:  ?Diagnosis Date  ? Antral erosion   ? EGD 12/2013  ? Arthritis   ? CAD (coronary artery disease)   ? DES to prox/mid RCA 09/2020 with medically managed moderate proximal LAD stenosis and occluded AV circumflex  ? Coronary vasospasm (HCC)   ? Essential hypertension   ? GERD (gastroesophageal reflux disease)   ? Hyperlipidemia   ? Type 2 diabetes mellitus ([place])   ? ? ?Past Surgical History:  ?Procedure Laterality Date  ? BIOPSY  02/19/2018  ? Procedure: BIOPSY;  Surgeon: Daneil Dolin, MD;  [facility];  Service: Endoscopy;;  esophagus  ? BIOPSY  07/05/2021  ? Procedure: BIOPSY;  Surgeon: Daneil Dolin, MD;  [facility];  Service: Endoscopy;;  ? CARDIAC CATHETERIZATION  2013  ? non obstructive CAD  ~20%  ? CHOLECYSTECTOMY N/A 04/01/2014  ? Procedure: LAPAROSCOPIC CHOLECYSTECTOMY;  Surgeon: Jamesetta So, MD;  Location: [facility];  Service: General;  Laterality: N/A;  ? COLONOSCOPY WITH PROPOFOL N/A 08/22/2016  ? Procedure: COLONOSCOPY WITH PROPOFOL;  Surgeon: Daneil Dolin, MD;  [facility];  Service: Endoscopy;  Laterality: N/A;  730 ?  ? CORONARY ATHERECTOMY N/A 10/05/2020  ? Procedure: CORONARY ATHERECTOMY;  Surgeon: Troy Sine, MD;  Location: [facility];  Service: Cardiovascular;   Laterality: N/A;  ? ESOPHAGOGASTRODUODENOSCOPY N/A 01/10/2014  ? Dr. Gala Romney: antral erosions, path with chronic inactive gastritis. Empiric dilation with 56-F dilation  ? ESOPHAGOGASTRODUODENOSCOPY (EGD) WITH PROPOFOL N/A 02/16/2016  ? Dr. Gala Romney: small hiatal hernia, normal esopagus s/p empiric dilation  ? ESOPHAGOGASTRODUODENOSCOPY (EGD) WITH PROPOFOL N/A 02/19/2018  ? Procedure: ESOPHAGOGASTRODUODENOSCOPY (EGD) WITH PROPOFOL;  Surgeon: Daneil Dolin, MD;  [facility];  Service: Endoscopy;  Laterality: N/A;  10:15am  ? ESOPHAGOGASTRODUODENOSCOPY (EGD) WITH PROPOFOL N/A 07/05/2021  ? Procedure: ESOPHAGOGASTRODUODENOSCOPY (EGD) WITH PROPOFOL;  Surgeon: Daneil Dolin, MD;  [facility];  Service: Endoscopy;  Laterality: N/A;  Patient is on  ? KNEE ARTHROSCOPY Right 2002  ? LEFT HEART CATH N/A 08/18/2011  ? Procedure: LEFT HEART CATH;  Surgeon: Lorretta Harp, MD;  Location: [facility] CATH LAB;  Service: Cardiovascular;  Laterality: N/A;  ? LEFT HEART CATH AND CORONARY ANGIOGRAPHY N/A 10/04/2020  ? Procedure: LEFT HEART CATH AND CORONARY ANGIOGRAPHY;  Surgeon: Troy Sine, MD;  Location: [place] [facility];  Service: Cardiovascular;  Laterality: N/A;  ? LIVER BIOPSY N/A 04/01/2014  ? mild fibrosis  ? MALONEY DILATION N/A 01/10/2014  ? Procedure: MALONEY DILATION;  Surgeon: Daneil Dolin, MD;  [facility];  Service: Endoscopy;  Laterality: N/A;  ? MALONEY DILATION N/A 02/16/2016  ? Procedure: MALONEY DILATION;  Surgeon: Daneil Dolin, MD;  [facility];  Service: Endoscopy;  Laterality: N/A;  ? MALONEY DILATION N/A 02/19/2018  ? Procedure: MALONEY DILATION;  Surgeon: Daneil Dolin, MD;  [facility];  Service: Endoscopy;  Laterality: N/A;  ? POLYPECTOMY  08/22/2016  ? Procedure: POLYPECTOMY;  Surgeon: Daneil Dolin, MD;  [facility];  Service: Endoscopy;;  ? SAVORY DILATION N/A 01/10/2014  ? Procedure: SAVORY DILATION;  Surgeon: Daneil Dolin, MD;   [facility];  Service: Endoscopy;  Laterality: N/A;  ? TEMPORARY PACEMAKER N/A 10/05/2020  ? Procedure: TEMPORARY PACEMAKER;  Surgeon: Troy Sine, MD;  Location: [place];  Service: Cardiovascular;  Laterality: N/A;  ? ? ?Current Outpatient Medications  ?Medication Sig Dispense Refill  ? amLODipine (NORVASC) 5 MG tablet Take 1 tablet by mouth daily.    ? Ascorbic Acid (VITAMIN C) 1000 MG tablet Take 1,000 mg by mouth in the morning and at bedtime.    ? aspirin EC 81 MG tablet Take 1 tablet (81 mg total) by mouth daily.    ? atorvastatin (LIPITOR) 40 MG tablet Take 1 tablet (40 mg total) by mouth daily. 90 tablet 3  ? carvedilol (COREG) 6.25 MG tablet Take 1 tablet (6.25 mg total) by mouth 2 (two) times daily. 14 tablet 0  ? cholecalciferol (VITAMIN D3) 25 MCG (1000 UNIT) tablet Take 1,000 Units by mouth daily.    ? dapagliflozin propanediol (FARXIGA) 10 MG TABS tablet Take 1 tablet (10 mg total) by mouth daily before breakfast. 30 tablet 11  ? Dulaglutide 1.5 MG/0.5ML SOPN Inject 1.5 mg into the skin every Sunday.    ? isosorbide mononitrate (IMDUR) 60 MG 24 hr tablet TAKE ONE TABLET BY MOUTH EVERY DAY PATIENT NEEDS OFFICE VISIT (Patient taking differently: Take 60 mg by mouth daily.) 7 tablet 0  ? loratadine-pseudoephedrine (CLARITIN-D 24-HOUR) 10-240 MG 24 hr tablet Take 1 tablet by mouth daily as needed for allergies.    ? losartan (COZAAR) 50 MG tablet TAKE 1 TABLET DAILY 90 tablet 3  ? Magnesium 500 MG TABS Take 500 mg by mouth daily as needed (for leg cramps).     ? metFORMIN (GLUCOPHAGE) 500 MG tablet Take 500 mg by mouth 2 (two) times daily.     ? nitroGLYCERIN (NITROSTAT) 0.4 MG SL tablet Place 1 tablet (0.4 mg total) under the tongue every 5 (five) minutes x 3 doses as needed for chest pain (if no relief after 2nd dose, proceed to ED or call 911). 25 tablet 3  ? pantoprazole (PROTONIX) 40 MG tablet Take 1 tablet (40 mg total) by mouth 2 (two) times daily. 60 tablet 11  ? ?No  current facility-administered medications for this visit.  ? ?Allergies:  Codeine, Diamox [acetazolamide], and Sulfa antibiotics  ? ?ROS: No palpitations or syncope. ? ?Physical Exam: ?VS:  BP 120/82   Pulse 83   Ht 6' 3"  (1.905 m)   Wt 278 lb 12.8 oz (126.5 kg)   SpO2 95%   BMI 34.85 kg/m? , BMI Body mass index is 34.85 kg/m?. ? ?Wt Readings from Last 3 Encounters:  ?11/06/21 278 lb 12.8 oz (126.5 kg)  ?09/12/21 278 lb 12.8 oz (126.5 kg)  ?08/06/21 269 lb 12.8 oz (122.4 kg)  ?  ?General: Patient appears comfortable at rest. ?HEENT: Conjunctiva and lids normal. ?Neck: Supple, no elevated JVP or carotid bruits, no thyromegaly. ?Lungs: Clear to auscultation, nonlabored breathing at rest. ?Cardiac: Regular rate and rhythm, no S3 or significant systolic murmur, no pericardial rub. ?  Extremities: No pitting edema. ? ?ECG:  An ECG dated 08/06/2021 was personally reviewed today and demonstrated:  Sinus rhythm with decreased R wave progression. ? ?Recent Labwork: ?07/04/2021: Hemoglobin 14.9; Platelets 169 ?07/06/2021: ALT 27; AST 42; BUN 10; Creatinine, Ser 0.92; Magnesium 2.0; Potassium 3.7; Sodium 140  ?   ?Component Value Date/Time  ? CHOL 118 10/06/2020 0352  ? TRIG 205 (H) 10/06/2020 0352  ? HDL 31 (L) 10/06/2020 0352  ? CHOLHDL 3.8 10/06/2020 0352  ? VLDL 41 (H) 10/06/2020 0352  ? [place] 46 10/06/2020 0352  ? ? ?Other Studies Reviewed Today: ? ?Echocardiogram 10/04/2020: ? 1. Left ventricular ejection fraction, by estimation, is 50 to 55%. The  ?left ventricle has low normal function. The left ventricle has no regional  ?wall motion abnormalities. There is mild concentric left ventricular  ?hypertrophy. Left ventricular  ?diastolic parameters are consistent with Grade II diastolic dysfunction  ?(pseudonormalization).  ? 2. Right ventricular systolic function is normal. The right ventricular  ?size is normal. Tricuspid regurgitation signal is inadequate for assessing  ?PA pressure.  ? 3. The mitral valve is normal in  structure. Trivial mitral valve  ?regurgitation.  ? 4. The aortic valve is tricuspid. Aortic valve regurgitation is trivial.  ?No aortic stenosis is present.  ? ?Assessment and Plan: ? ?1.  CAD status post atherectomy and DES

## 2021-11-06 NOTE — Patient Instructions (Addendum)
Medication Instructions:  ?Your physician has recommended you make the following change in your medication:  ?Stop clopidogrel ?Continue other medications the same ? ?Labwork: ?none ? ?Testing/Procedures: ?none ? ?Follow-Up: ?Your physician recommends that you schedule a follow-up appointment in: 6 months ? ?Any Other Special Instructions Will Be Listed Below (If Applicable). ? ?If you need a refill on your cardiac medications before your next appointment, please call your pharmacy. ?

## 2022-03-01 ENCOUNTER — Telehealth: Payer: Self-pay | Admitting: Gastroenterology

## 2022-03-01 NOTE — Telephone Encounter (Addendum)
Received copy of labs 07/2018, from [facility], urinary copper was 9. 24-hour urine copper 17 normal

## 2022-03-05 NOTE — Telephone Encounter (Signed)
Patient is due follow up NASH, GERD, please make an appt.

## 2022-03-12 ENCOUNTER — Telehealth: Payer: Self-pay | Admitting: Cardiology

## 2022-03-12 NOTE — Telephone Encounter (Signed)
Pt c/o of Chest Pain: STAT if CP now or developed within 24 hours  1. Are you having CP right now? Yes- pain in chest and back in shoulder blade  2. Are you experiencing any other symptoms (ex. SOB, nausea, vomiting, sweating)? Nauseated, not at this time  3. How long have you been experiencing CP? Since the weekend  4. Is your CP continuous or coming and going?  constantly  most of the time  5. Have you taken Nitroglycerin? No- I made an appointment for Thursday with Almyra Deforest ?

## 2022-03-12 NOTE — Telephone Encounter (Signed)
Patient notified and verbalized understanding. 

## 2022-03-12 NOTE — Telephone Encounter (Signed)
I don't know how to schedule these

## 2022-03-12 NOTE — Telephone Encounter (Signed)
Patient rate his chest pain 3/10.  States that he also has been having some digestive issues as well so not sure what to do.  Describes his pain as being on his chest bone along with pain in his shoulder blades.  BP now 155/91  62.  States that he did take NTG x 1 & thinks maybe has eased a little.  Is still currently taking his Imdur at 66m daily.  Does have appointment scheduled for this Thursday with HAlmyra Deforest PA.

## 2022-03-13 NOTE — Progress Notes (Signed)
This encounter was created in error - please disregard.

## 2022-03-14 ENCOUNTER — Ambulatory Visit: Payer: BC Managed Care – PPO | Attending: Physician Assistant | Admitting: Physician Assistant

## 2022-03-14 NOTE — Telephone Encounter (Addendum)
Jerome Shepherd, it is currently 30 min after his scheduled appt, unfortunately he still has not showed up. Was he told of the correct office address?

## 2022-03-14 NOTE — Telephone Encounter (Signed)
Patient no showed to today's visit for evaluation of chest pain, please reschedule his appt.

## 2022-03-15 NOTE — Telephone Encounter (Signed)
Left message to return call 

## 2022-03-17 NOTE — Telephone Encounter (Signed)
Jerome Shepherd, patient just needs an Office visit and in appt notes please put NASH, GERD.

## 2022-03-22 NOTE — Telephone Encounter (Signed)
Seen by pcp on 03/13/2022.  Note scanned into epic for provider review.

## 2022-03-25 ENCOUNTER — Other Ambulatory Visit: Payer: Self-pay | Admitting: Internal Medicine

## 2022-03-26 ENCOUNTER — Other Ambulatory Visit: Payer: Self-pay | Admitting: Internal Medicine

## 2022-03-26 ENCOUNTER — Encounter (HOSPITAL_COMMUNITY): Payer: Self-pay

## 2022-03-26 ENCOUNTER — Telehealth: Payer: Self-pay | Admitting: Cardiology

## 2022-03-26 DIAGNOSIS — K2100 Gastro-esophageal reflux disease with esophagitis, without bleeding: Secondary | ICD-10-CM

## 2022-03-26 DIAGNOSIS — R1013 Epigastric pain: Secondary | ICD-10-CM

## 2022-03-26 DIAGNOSIS — R1319 Other dysphagia: Secondary | ICD-10-CM

## 2022-03-26 NOTE — Telephone Encounter (Signed)
Spoke with [name] regarding recent chest pains that he was having. Was checking to see about making another appointment. [name] states that he is seeing a .Gastroenterologist for possible esophagus issues. He wants to keep his November appointment with Dr. Domenic Polite. He will call the office if he needs anything before his November appointment.

## 2022-03-26 NOTE — Progress Notes (Signed)
Jerome Shepherd Bowel Prep reminder: npo after MN  For Anesthesia: PCP - [place] Cardiologist - Rozann Lesches  Chest x-ray - 09/2020 EKG - 08/06/21 Stress Test -  02/2020 ECHO - 09/2020 Cardiac Cath -  09/2020 Pacemaker/ICD device last checked: n/a Sleep Study - 2015 CPAP -  n/a   Blood Thinner Instructions: n/a Instructions:   Last Dose:      Anesthesia review: Hx of CAD, HTN, DM, saw cards back in May but, he called cards office stated having chest pain, they set up with an appt and was no show. After talking to patient he feels as though the chest pain is more indigestion and why he is having EGD.

## 2022-03-26 NOTE — Telephone Encounter (Signed)
Noted  

## 2022-04-01 NOTE — Anesthesia Preprocedure Evaluation (Signed)
Anesthesia Evaluation  Patient identified by MRN, date of birth, ID band Patient awake    Reviewed: Allergy & Precautions, H&P , NPO status , Patient's Chart, lab work & pertinent test results  History of Anesthesia Complications (+) PONV and history of anesthetic complications  Airway Mallampati: II  TM Distance: >3 FB Neck ROM: Full    Dental no notable dental hx. (+) Dental Advisory Given   Pulmonary neg pulmonary ROS, former smoker,    Pulmonary exam normal        Cardiovascular hypertension, Pt. on medications and Pt. on home beta blockers + CAD and + Cardiac Stents  Normal cardiovascular exam  Echo 4/22 IMPRESSIONS    1. Left ventricular ejection fraction, by estimation, is 50 to 55%. The  left ventricle has low normal function. The left ventricle has no regional  wall motion abnormalities. There is mild concentric left ventricular  hypertrophy. Left ventricular  diastolic parameters are consistent with Grade II diastolic dysfunction  (pseudonormalization).  2. Right ventricular systolic function is normal. The right ventricular  size is normal. Tricuspid regurgitation signal is inadequate for assessing  PA pressure.  3. The mitral valve is normal in structure. Trivial mitral valve  regurgitation.  4. The aortic valve is tricuspid. Aortic valve regurgitation is trivial.  No aortic stenosis is present.    Neuro/Psych  Neuromuscular disease negative psych ROS   GI/Hepatic PUD, GERD  Medicated,(+) Hepatitis -  Endo/Other  diabetes, Type 2  Renal/GU ARFRenal disease  negative genitourinary   Musculoskeletal   Abdominal   Peds  Hematology negative hematology ROS (+)   Anesthesia Other Findings   Reproductive/Obstetrics negative OB ROS                            Anesthesia Physical  Anesthesia Plan  ASA: 3  Anesthesia Plan: MAC   Post-op Pain Management: Minimal or no  pain anticipated   Induction:   PONV Risk Score and Plan: 1 and Propofol infusion, Ondansetron and Dexamethasone  Airway Management Planned: Natural Airway  Additional Equipment:   Intra-op Plan:   Post-operative Plan:   Informed Consent: I have reviewed the patients History and Physical, chart, labs and discussed the procedure including the risks, benefits and alternatives for the proposed anesthesia with the patient or authorized representative who has indicated his/her understanding and acceptance.     Dental advisory given  Plan Discussed with: Anesthesiologist and CRNA  Anesthesia Plan Comments:        Anesthesia Quick Evaluation

## 2022-04-02 ENCOUNTER — Ambulatory Visit (HOSPITAL_COMMUNITY)
Admission: RE | Admit: 2022-04-02 | Discharge: 2022-04-02 | Disposition: A | Discharge status: Home / Self Care | Payer: BC Managed Care – PPO | Attending: Internal Medicine | Admitting: Internal Medicine

## 2022-04-02 ENCOUNTER — Other Ambulatory Visit: Payer: Self-pay

## 2022-04-02 ENCOUNTER — Ambulatory Visit (HOSPITAL_COMMUNITY): Payer: BC Managed Care – PPO | Admitting: Physician Assistant

## 2022-04-02 ENCOUNTER — Ambulatory Visit: Payer: BC Managed Care – PPO | Admitting: Internal Medicine

## 2022-04-02 ENCOUNTER — Encounter (HOSPITAL_COMMUNITY): Payer: Self-pay

## 2022-04-02 ENCOUNTER — Encounter (HOSPITAL_COMMUNITY): Admission: RE | Disposition: A | Discharge status: Home / Self Care | Payer: Self-pay | Source: Home / Self Care

## 2022-04-02 DIAGNOSIS — K2100 Gastro-esophageal reflux disease with esophagitis, without bleeding: Secondary | ICD-10-CM | POA: Diagnosis not present

## 2022-04-02 DIAGNOSIS — K317 Polyp of stomach and duodenum: Secondary | ICD-10-CM | POA: Insufficient documentation

## 2022-04-02 DIAGNOSIS — Z7984 Long term (current) use of oral hypoglycemic drugs: Secondary | ICD-10-CM | POA: Insufficient documentation

## 2022-04-02 DIAGNOSIS — R1310 Dysphagia, unspecified: Secondary | ICD-10-CM | POA: Insufficient documentation

## 2022-04-02 DIAGNOSIS — E119 Type 2 diabetes mellitus without complications: Secondary | ICD-10-CM | POA: Insufficient documentation

## 2022-04-02 DIAGNOSIS — K3189 Other diseases of stomach and duodenum: Secondary | ICD-10-CM | POA: Insufficient documentation

## 2022-04-02 DIAGNOSIS — I2510 Atherosclerotic heart disease of native coronary artery without angina pectoris: Secondary | ICD-10-CM | POA: Diagnosis not present

## 2022-04-02 DIAGNOSIS — Z955 Presence of coronary angioplasty implant and graft: Secondary | ICD-10-CM | POA: Insufficient documentation

## 2022-04-02 DIAGNOSIS — K2970 Gastritis, unspecified, without bleeding: Secondary | ICD-10-CM | POA: Insufficient documentation

## 2022-04-02 DIAGNOSIS — Z87891 Personal history of nicotine dependence: Secondary | ICD-10-CM | POA: Insufficient documentation

## 2022-04-02 DIAGNOSIS — I10 Essential (primary) hypertension: Secondary | ICD-10-CM | POA: Insufficient documentation

## 2022-04-02 HISTORY — PX: BIOPSY: SHX5522

## 2022-04-02 HISTORY — PX: ESOPHAGOGASTRODUODENOSCOPY (EGD) WITH PROPOFOL: SHX5813

## 2022-04-02 HISTORY — PX: POLYPECTOMY: SHX5525

## 2022-04-02 HISTORY — PX: ESOPHAGEAL MANOMETRY: SHX5429

## 2022-04-02 LAB — GLUCOSE, CAPILLARY: Glucose-Capillary: 196 mg/dL — ABNORMAL HIGH (ref 70–99)

## 2022-04-02 SURGERY — ESOPHAGOGASTRODUODENOSCOPY (EGD) WITH PROPOFOL
Anesthesia: Monitor Anesthesia Care

## 2022-04-02 SURGERY — MANOMETRY, ESOPHAGUS

## 2022-04-02 MED ORDER — DEXAMETHASONE SODIUM PHOSPHATE 10 MG/ML IJ SOLN
INTRAMUSCULAR | Status: DC | PRN
  Administered 2022-04-02: 10 mg via INTRAVENOUS

## 2022-04-02 MED ORDER — PROPOFOL 500 MG/50ML IV EMUL
INTRAVENOUS | Status: AC
  Filled 2022-04-02: qty 50

## 2022-04-02 MED ORDER — LIDOCAINE HCL (CARDIAC) PF 100 MG/5ML IV SOSY
PREFILLED_SYRINGE | INTRAVENOUS | Status: DC | PRN
  Administered 2022-04-02: 50 mg via INTRAVENOUS

## 2022-04-02 MED ORDER — PROPOFOL 500 MG/50ML IV EMUL
INTRAVENOUS | Status: DC | PRN
  Administered 2022-04-02: 135 ug/kg/min via INTRAVENOUS

## 2022-04-02 MED ORDER — PROPOFOL 1000 MG/100ML IV EMUL
INTRAVENOUS | Status: AC
  Filled 2022-04-02: qty 100

## 2022-04-02 MED ORDER — PROPOFOL 10 MG/ML IV BOLUS
INTRAVENOUS | Status: AC
  Filled 2022-04-02: qty 20

## 2022-04-02 MED ORDER — PROPOFOL 10 MG/ML IV BOLUS
INTRAVENOUS | Status: DC | PRN
  Administered 2022-04-02 (×2): 20 mg via INTRAVENOUS
  Administered 2022-04-02: 50 mg via INTRAVENOUS
  Administered 2022-04-02: 20 mg via INTRAVENOUS

## 2022-04-02 MED ORDER — ONDANSETRON HCL 4 MG/2ML IJ SOLN
INTRAMUSCULAR | Status: DC | PRN
  Administered 2022-04-02: 4 mg via INTRAVENOUS

## 2022-04-02 MED ORDER — LACTATED RINGERS IV SOLN: INTRAVENOUS | Status: DC

## 2022-04-02 SURGICAL SUPPLY — 2 items
FACESHIELD LNG OPTICON STERILE (SAFETY) IMPLANT
GLOVE BIO SURGEON STRL SZ8 (GLOVE) ×2 IMPLANT

## 2022-04-02 NOTE — Transfer of Care (Signed)
Immediate Anesthesia Transfer of Care Note  Patient: Jerome Shepherd  Procedure(s) Performed: Procedure(s) with comments: ESOPHAGOGASTRODUODENOSCOPY (EGD) WITH PROPOFOL (N/A) - with Placement of Manometry Probe BIOPSY POLYPECTOMY  Patient Location: PACU  Anesthesia Type:MAC  Level of Consciousness:  sedated, patient cooperative and responds to stimulation  Airway & Oxygen Therapy:Patient Spontanous Breathing and Patient connected to face mask oxgen  Post-op Assessment:  Report given to PACU RN and Post -op Vital signs reviewed and stable  Post vital signs:  Reviewed and stable  Last Vitals:  Vitals:   04/02/22 0647  BP: (!) 163/77  Resp: 20  Temp: 36.6 C  SpO2: 36%    Complications: No apparent anesthesia complications

## 2022-04-02 NOTE — Op Note (Signed)
[facility] Patient Name: Jerome Shepherd Procedure Date: 04/02/2022 MRN: [id number] Attending MD: Danton Clap DO, DO Date of Birth: 08-24-1965 CSN: [id number] Age: 56 Admit Type: Outpatient Procedure:                Upper GI endoscopy Indications:              Dysphagia, Nausea, Persistent vomiting of unknown                            cause Providers:                Danton Clap DO, DO, Jaci Carrel, RN, Frazier Richards, Technician Referring MD:              Medicines:                See the Anesthesia note for documentation of the                            administered medications Complications:            No immediate complications. Estimated Blood Loss:     Estimated blood loss was minimal. Estimated blood                            loss was minimal. Procedure:                Pre-Anesthesia Assessment:                           - ASA Grade Assessment: III - A patient with severe                            systemic disease.                           After obtaining informed consent, the endoscope was                            passed under direct vision. Throughout the                            procedure, the patient's blood pressure, pulse, and                            oxygen saturations were monitored continuously. The                            [id number]-H190 ([id number]) Olympus endoscope was introduced                            through the mouth, and advanced to the second part                            of duodenum. The upper GI endoscopy was  accomplished without difficulty. The patient                            tolerated the procedure well. Scope In: Scope Out: Findings:      The examined esophagus was normal. Biopsies were obtained from the       proximal and distal esophagus with cold forceps for histology of       suspected eosinophilic esophagitis.      Diffuse moderate inflammation  characterized by congestion (edema) was       found in the entire examined stomach. Biopsies were taken with a cold       forceps for Helicobacter pylori testing.      The examined duodenum was normal. Biopsies for histology were taken with       a cold forceps for evaluation of celiac disease.      The cardia and gastric fundus were normal on retroflexion.      An esophageal impedance monitoring catheter was placed at 60 cm from the       nares. Impression:               - Normal esophagus. Biopsied.                           - Gastritis. Biopsied.                           - Normal examined duodenum. Biopsied. Moderate Sedation:      See the other procedure note for documentation of moderate sedation with       intraservice time. Recommendation:           - Perform routine esophageal manometry today.                           - Discharge patient to home.                           - Use Nexium (esomeprazole) 20 mg PO BID for 8                            weeks. Procedure Code(s):        --- Professional ---                           [phone], Esophagogastroduodenoscopy, flexible,                            transoral; with biopsy, single or multiple Diagnosis Code(s):        --- Professional ---                           K29.70, Gastritis, unspecified, without bleeding                           R13.10, Dysphagia, unspecified                           R11.0, Nausea CPT copyright 2019 [employer]. All rights reserved. The codes documented in this report are preliminary  and upon coder review may  be revised to meet current compliance requirements. Dr Danton Clap, DO Danton Clap DO, DO 04/02/2022 8:48:43 AM Number of Addenda: 0

## 2022-04-02 NOTE — Discharge Instructions (Signed)
YOU HAD AN ENDOSCOPIC PROCEDURE TODAY: Refer to the procedure report and other information in the discharge instructions given to you for any specific questions about what was found during the examination. If this information does not answer your questions, please call [place] office at [phone] to clarify.   YOU SHOULD EXPECT: Some feelings of bloating in the abdomen. Passage of more gas than usual. Walking can help get rid of the air that was put into your GI tract during the procedure and reduce the bloating. If you had a lower endoscopy (such as a colonoscopy or flexible sigmoidoscopy) you may notice spotting of blood in your stool or on the toilet paper. Some abdominal soreness may be present for a day or two, also.  DIET: Your first meal following the procedure should be a light meal and then it is ok to progress to your normal diet. A half-sandwich or bowl of soup is an example of a good first meal. Heavy or fried foods are harder to digest and may make you feel nauseous or bloated. Drink plenty of fluids but you should avoid alcoholic beverages for 24 hours. If you had a esophageal dilation, please see attached instructions for diet.    ACTIVITY: Your care partner should take you home directly after the procedure. You should plan to take it easy, moving slowly for the rest of the day. You can resume normal activity the day after the procedure however YOU SHOULD NOT DRIVE, use power tools, machinery or perform tasks that involve climbing or major physical exertion for 24 hours (because of the sedation medicines used during the test).   SYMPTOMS TO REPORT IMMEDIATELY: A gastroenterologist can be reached at any hour. Please call [phone]  for any of the following symptoms:   Following upper endoscopy (EGD, EUS, ERCP, esophageal dilation) Vomiting of blood or coffee ground material  New, significant abdominal pain  New, significant chest pain or pain under the shoulder blades  Painful or  persistently difficult swallowing  New shortness of breath  Black, tarry-looking or red, bloody stools  FOLLOW UP:  If any biopsies were taken you will be contacted by phone or by letter within the next 1-3 weeks. Call [phone]  if you have not heard about the biopsies in 3 weeks.  Please also call with any specific questions about appointments or follow up tests.

## 2022-04-02 NOTE — Anesthesia Postprocedure Evaluation (Addendum)
Anesthesia Post Note  Patient: Jerome Shepherd  Procedure(s) Performed: ESOPHAGOGASTRODUODENOSCOPY (EGD) WITH PROPOFOL BIOPSY POLYPECTOMY     Patient location during evaluation: Endoscopy Anesthesia Type: MAC Level of consciousness: awake and alert Pain management: pain level controlled Vital Signs Assessment: post-procedure vital signs reviewed and stable Respiratory status: spontaneous breathing and respiratory function stable Cardiovascular status: stable Postop Assessment: no apparent nausea or vomiting Anesthetic complications: no   No notable events documented.  Last Vitals:  Vitals:   04/02/22 0840 04/02/22 0850  BP: (!) 171/80 (!) 176/78  Pulse: 69 65  Resp: 12 15  Temp:    SpO2: 95% 95%    Last Pain:  Vitals:   04/02/22 0850  TempSrc:   PainSc: 0-No pain                 Riann Oman DANIEL

## 2022-04-02 NOTE — Progress Notes (Signed)
Esophageal manometry probe placed during EGD.  Patient then recovered to proceed with manometry.  Probe was pulled back and placed at 53 cm at left nare.  Manometry performed per protocol.  Patient tolerated well.  Probe removed and patient discharged to home.  Report to be sent to Dr Wilford Corner.

## 2022-04-02 NOTE — Addendum Note (Signed)
Addendum  created 04/02/22 1028 by Duane Boston, MD   Clinical Note Signed

## 2022-04-04 ENCOUNTER — Other Ambulatory Visit (HOSPITAL_COMMUNITY): Payer: Self-pay | Admitting: Internal Medicine

## 2022-04-04 DIAGNOSIS — R1013 Epigastric pain: Secondary | ICD-10-CM

## 2022-04-04 DIAGNOSIS — R1319 Other dysphagia: Secondary | ICD-10-CM

## 2022-04-04 DIAGNOSIS — K2100 Gastro-esophageal reflux disease with esophagitis, without bleeding: Secondary | ICD-10-CM

## 2022-04-04 LAB — SURGICAL PATHOLOGY

## 2022-04-05 ENCOUNTER — Encounter (HOSPITAL_COMMUNITY): Payer: Self-pay | Admitting: Internal Medicine

## 2022-04-19 ENCOUNTER — Ambulatory Visit (HOSPITAL_COMMUNITY)
Admission: RE | Admit: 2022-04-19 | Discharge: 2022-04-19 | Disposition: A | Discharge status: Home / Self Care | Payer: BC Managed Care – PPO | Source: Ambulatory Visit | Attending: Internal Medicine | Admitting: Internal Medicine

## 2022-04-19 DIAGNOSIS — R1013 Epigastric pain: Secondary | ICD-10-CM

## 2022-04-19 DIAGNOSIS — K2100 Gastro-esophageal reflux disease with esophagitis, without bleeding: Secondary | ICD-10-CM | POA: Diagnosis present

## 2022-04-19 DIAGNOSIS — R1319 Other dysphagia: Secondary | ICD-10-CM | POA: Diagnosis present

## 2022-04-19 MED ORDER — GADOBUTROL 1 MMOL/ML IV SOLN
10.0000 mL | Freq: Once | INTRAVENOUS | Status: AC | PRN
  Administered 2022-04-19: 10 mL via INTRAVENOUS

## 2022-04-19 MED ORDER — GLUCAGON HCL RDNA (DIAGNOSTIC) 1 MG IJ SOLR: 1.0000 mg | Freq: Once | INTRAMUSCULAR | Status: DC

## 2022-04-19 MED ORDER — GLUCAGON HCL RDNA (DIAGNOSTIC) 1 MG IJ SOLR
INTRAMUSCULAR | Status: AC
  Filled 2022-04-19: qty 1

## 2022-05-20 ENCOUNTER — Encounter: Payer: Self-pay | Admitting: Cardiology

## 2022-05-20 ENCOUNTER — Ambulatory Visit: Payer: BC Managed Care – PPO | Attending: Cardiology | Admitting: Cardiology

## 2022-05-20 VITALS — BP 132/84 | HR 83 | Ht 75.0 in | Wt 290.0 lb

## 2022-05-20 DIAGNOSIS — I25119 Atherosclerotic heart disease of native coronary artery with unspecified angina pectoris: Secondary | ICD-10-CM | POA: Diagnosis not present

## 2022-05-20 DIAGNOSIS — I10 Essential (primary) hypertension: Secondary | ICD-10-CM | POA: Diagnosis not present

## 2022-05-20 DIAGNOSIS — E782 Mixed hyperlipidemia: Secondary | ICD-10-CM | POA: Diagnosis not present

## 2022-05-20 NOTE — Progress Notes (Signed)
Cardiology Office Note  Date: 05/20/2022   ID: Jerome Shepherd, DOB [birth year], MRN [id number]  PCP:  Curlene Labrum, MD  Cardiologist:  Rozann Lesches, MD Electrophysiologist:  None   Chief Complaint  Patient presents with   Cardiac follow-up    History of Present Illness: Jerome Shepherd is a 56 y.o. male last seen in May.  He is here for a routine visit.  Doing well without progressive angina or recurrent nitroglycerin use.  He just had a physical with Dr. Pleas Koch, I reviewed his recent lab work as noted below.  We went over his medications today.  No changes from a cardiac perspective.  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  Past Medical History:  Diagnosis Date   Antral erosion    EGD 12/2013   Arthritis    CAD (coronary artery disease)    DES to prox/mid RCA 09/2020 with medically managed moderate proximal LAD stenosis and occluded AV circumflex   Coronary vasospasm ([place])    Essential hypertension    GERD (gastroesophageal reflux disease)    Hyperlipidemia    Type 2 diabetes mellitus ([place])     Past Surgical History:  Procedure Laterality Date   BIOPSY  02/19/2018   Procedure: BIOPSY;  Surgeon: Daneil Dolin, MD;  [facility];  Service: Endoscopy;;  esophagus   BIOPSY  07/05/2021   Procedure: BIOPSY;  Surgeon: Daneil Dolin, MD;  [facility];  Service: Endoscopy;;   BIOPSY  04/02/2022   Procedure: BIOPSY;  Surgeon: Loney Laurence, DO;  Location: [facility];  Service: Gastroenterology;;   CARDIAC CATHETERIZATION  2013   non obstructive CAD  ~20%   CHOLECYSTECTOMY N/A 04/01/2014   Procedure: LAPAROSCOPIC CHOLECYSTECTOMY;  Surgeon: Jamesetta So, MD;  Location: [facility];  Service: General;  Laterality: N/A;   COLONOSCOPY WITH PROPOFOL N/A 08/22/2016   Procedure: COLONOSCOPY WITH PROPOFOL;  Surgeon: Daneil Dolin, MD;  [facility];  Service: Endoscopy;  Laterality: N/A;  730    CORONARY ATHERECTOMY N/A 10/05/2020    Procedure: CORONARY ATHERECTOMY;  Surgeon: Troy Sine, MD;  Location: [place] [facility];  Service: Cardiovascular;  Laterality: N/A;   ESOPHAGEAL MANOMETRY N/A 04/02/2022   Procedure: ESOPHAGEAL MANOMETRY (EM);  Surgeon: Loney Laurence, DO;  [facility];  Service: Gastroenterology;  Laterality: N/A;   ESOPHAGOGASTRODUODENOSCOPY N/A 01/10/2014   Dr. Gala Romney: antral erosions, path with chronic inactive gastritis. Empiric dilation with 56-F dilation   ESOPHAGOGASTRODUODENOSCOPY (EGD) WITH PROPOFOL N/A 02/16/2016   Dr. Gala Romney: small hiatal hernia, normal esopagus s/p empiric dilation   ESOPHAGOGASTRODUODENOSCOPY (EGD) WITH PROPOFOL N/A 02/19/2018   Procedure: ESOPHAGOGASTRODUODENOSCOPY (EGD) WITH PROPOFOL;  Surgeon: Daneil Dolin, MD;  [facility];  Service: Endoscopy;  Laterality: N/A;  10:15am   ESOPHAGOGASTRODUODENOSCOPY (EGD) WITH PROPOFOL N/A 07/05/2021   Procedure: ESOPHAGOGASTRODUODENOSCOPY (EGD) WITH PROPOFOL;  Surgeon: Daneil Dolin, MD;  [facility];  Service: Endoscopy;  Laterality: N/A;  Patient is on   ESOPHAGOGASTRODUODENOSCOPY (EGD) WITH PROPOFOL N/A 04/02/2022   Procedure: ESOPHAGOGASTRODUODENOSCOPY (EGD) WITH PROPOFOL;  Surgeon: Loney Laurence, DO;  [facility];  Service: Gastroenterology;  Laterality: N/A;   KNEE ARTHROSCOPY Right 2002   LEFT HEART CATH N/A 08/18/2011   Procedure: LEFT HEART CATH;  Surgeon: Lorretta Harp, MD;  Location: [facility] C[facility] LAB;  Service: Cardiovascular;  Laterality: N/A;   LEFT HEART CATH AND CORONARY ANGIOGRAPHY N/A 10/04/2020   Procedure: LEFT HEART  CATH AND CORONARY ANGIOGRAPHY;  Surgeon: Troy Sine, MD;  Location: [place] [facility];  Service: Cardiovascular;  Laterality: N/A;   LIVER BIOPSY N/A 04/01/2014   mild fibrosis   MALONEY DILATION N/A 01/10/2014   Procedure: Venia Minks DILATION;  Surgeon: Daneil Dolin, MD;  [facility];  Service: Endoscopy;  Laterality: N/A;   MALONEY  DILATION N/A 02/16/2016   Procedure: Venia Minks DILATION;  Surgeon: Daneil Dolin, MD;  [facility];  Service: Endoscopy;  Laterality: N/A;   MALONEY DILATION N/A 02/19/2018   Procedure: Venia Minks DILATION;  Surgeon: Daneil Dolin, MD;  [facility];  Service: Endoscopy;  Laterality: N/A;   POLYPECTOMY  08/22/2016   Procedure: POLYPECTOMY;  Surgeon: Daneil Dolin, MD;  [facility];  Service: Endoscopy;;   POLYPECTOMY  04/02/2022   Procedure: POLYPECTOMY;  Surgeon: Loney Laurence, DO;  [facility];  Service: Gastroenterology;;   Azzie Almas DILATION N/A 01/10/2014   Procedure: Azzie Almas DILATION;  Surgeon: Daneil Dolin, MD;  [facility];  Service: Endoscopy;  Laterality: N/A;   TEMPORARY PACEMAKER N/A 10/05/2020   Procedure: TEMPORARY PACEMAKER;  Surgeon: Troy Sine, MD;  Location: [place] [facility];  Service: Cardiovascular;  Laterality: N/A;    Current Outpatient Medications  Medication Sig Dispense Refill   amLODipine (NORVASC) 2.5 MG tablet Take 2.5 tablets by mouth daily.     Ascorbic Acid (VITAMIN C PO) Take 2,000 mg by mouth daily.     aspirin EC 81 MG tablet Take 1 tablet (81 mg total) by mouth daily.     atorvastatin (LIPITOR) 40 MG tablet Take 1 tablet (40 mg total) by mouth daily. 90 tablet 3   Baclofen 5 MG TABS Take 1 tablet by mouth 3 (three) times daily.     carvedilol (COREG) 6.25 MG tablet Take 1 tablet (6.25 mg total) by mouth 2 (two) times daily. 14 tablet 0   cholecalciferol (VITAMIN D3) 25 MCG (1000 UNIT) tablet Take 1,000 Units by mouth daily.     dapagliflozin propanediol (FARXIGA) 10 MG TABS tablet Take 1 tablet (10 mg total) by mouth daily before breakfast. 30 tablet 11   Dulaglutide 3 MG/0.5ML SOPN Inject 3 mg into the skin every Sunday.     esomeprazole (NEXIUM) 40 MG capsule Take 40 mg by mouth 2 (two) times daily.     fluticasone (FLONASE) 50 MCG/ACT nasal spray Place 1 spray into both nostrils daily as  needed for allergies or rhinitis.     isosorbide mononitrate (IMDUR) 60 MG 24 hr tablet TAKE ONE TABLET BY MOUTH EVERY DAY PATIENT NEEDS OFFICE VISIT (Patient taking differently: Take 60 mg by mouth daily.) 7 tablet 0   loratadine-pseudoephedrine (CLARITIN-D 24-HOUR) 10-240 MG 24 hr tablet Take 1 tablet by mouth daily as needed for allergies.     losartan (COZAAR) 50 MG tablet TAKE 1 TABLET DAILY 90 tablet 3   Magnesium 500 MG TABS Take 500 mg by mouth daily as needed (for leg cramps).      metFORMIN (GLUCOPHAGE) 500 MG tablet Take 500 mg by mouth 2 (two) times daily.      nitroGLYCERIN (NITROSTAT) 0.4 MG SL tablet Place 1 tablet (0.4 mg total) under the tongue every 5 (five) minutes x 3 doses as needed for chest pain (if no relief after 2nd dose, proceed to ED or call 911). 25 tablet 3   pantoprazole (PROTONIX) 40 MG tablet Take 1 tablet (40 mg total)  by mouth 2 (two) times daily. 60 tablet 11   triamcinolone (KENALOG) 0.1 % paste Use as directed 1 Application in the mouth or throat as needed (mouth ulcer).     No current facility-administered medications for this visit.   Allergies:  Codeine, Diamox [acetazolamide], and Sulfa antibiotics   ROS: No palpitations or syncope.  Physical Exam: VS:  BP 132/84   Pulse 83   Ht 6' 3"  (1.905 m)   Wt 290 lb (131.5 kg)   SpO2 95%   BMI 36.25 kg/m , BMI Body mass index is 36.25 kg/m.  Wt Readings from Last 3 Encounters:  05/20/22 290 lb (131.5 kg)  04/02/22 280 lb (127 kg)  11/06/21 278 lb 12.8 oz (126.5 kg)    General: Patient appears comfortable at rest. HEENT: Conjunctiva and lids normal. Neck: Supple, no elevated JVP or carotid bruits. Lungs: Clear to auscultation, nonlabored breathing at rest. Cardiac: Regular rate and rhythm, no S3 or significant systolic murmur, no pericardial rub. Extremities: No pitting edema.  ECG:  An ECG dated 08/06/2021 was personally reviewed today and demonstrated:  Sinus rhythm with decreased R wave  progression.  Recent Labwork: 07/04/2021: Hemoglobin 14.9; Platelets 169 07/06/2021: ALT 27; AST 42; BUN 10; Creatinine, Ser 0.92; Magnesium 2.0; Potassium 3.7; Sodium 140     Component Value Date/Time   CHOL 118 10/06/2020 0352   TRIG 205 (H) 10/06/2020 0352   HDL 31 (L) 10/06/2020 0352   CHOLHDL 3.8 10/06/2020 0352   VLDL 41 (H) 10/06/2020 0352   LDLCALC 46 10/06/2020 0352  May 2023: Hemoglobin 17.7, platelets 252, BUN 14, creatinine 1.1, potassium 4.6, AST 41, ALT 25, cholesterol 114, triglycerides 295, HDL 32, LDL 37, hemoglobin A1c 7.3%, TSH 2.68 November 2023: Potassium 4.3, BUN 16, creatinine 1.15, AST 30, ALT 19, hemoglobin A1c 7.6%, cholesterol 104, HDL 33, triglycerides 206, LDL 29  Other Studies Reviewed Today:  Echocardiogram 10/04/2020:  1. Left ventricular ejection fraction, by estimation, is 50 to 55%. The  left ventricle has low normal function. The left ventricle has no regional  wall motion abnormalities. There is mild concentric left ventricular  hypertrophy. Left ventricular  diastolic parameters are consistent with Grade II diastolic dysfunction  (pseudonormalization).   2. Right ventricular systolic function is normal. The right ventricular  size is normal. Tricuspid regurgitation signal is inadequate for assessing  PA pressure.   3. The mitral valve is normal in structure. Trivial mitral valve  regurgitation.   4. The aortic valve is tricuspid. Aortic valve regurgitation is trivial.  No aortic stenosis is present.   Assessment and Plan:  1.  CAD status post arthrectomy and DES to the RCA in April 2022 with moderate residual LAD disease being managed medically.  He is doing well without accelerating angina.  Continue aspirin, Coreg, Imdur, Cozaar, Farxiga, Lipitor, and as needed nitroglycerin.  2.  Mixed hyperlipidemia, on Lipitor with LDL most recently 29.  3.  Essential hypertension, also on Norvasc in addition to the above.  No changes were made  today.  Medication Adjustments/Labs and Tests Ordered: Current medicines are reviewed at length with the patient today.  Concerns regarding medicines are outlined above.   Tests Ordered: No orders of the defined types were placed in this encounter.   Medication Changes: No orders of the defined types were placed in this encounter.   Disposition:  Follow up  6 months.  Signed, Satira Sark, MD, [facility] 05/20/2022 2:40 PM    [facility]  at [address], [place], [place] [address] Phone: [phone]; Fax: [phone]

## 2022-05-20 NOTE — Patient Instructions (Addendum)

## 2022-07-10 ENCOUNTER — Other Ambulatory Visit: Payer: Self-pay | Admitting: Cardiology

## 2022-08-28 ENCOUNTER — Telehealth: Payer: Self-pay | Admitting: Cardiology

## 2022-08-28 NOTE — Telephone Encounter (Signed)
Reports intermittent chest pain rated 5/10, located around heart area that started one week ago. Says chest discomfort is is accompanied with SOB and dizziness. Did not sound SOB on phone. Denies active chest pain. Says while walking in a store, he gets lightheaded. Says he is able to drive. Reports staying well hydrated. Has not checked BP or HR. Medications reviewed. Reports using nitroglycerin 0.4 mg x's 2 on Sunday with no relief. Adised that nitro can be used every 5 minutes up to 3 doses, if no relief after 3rd dose, proceed to ED or call 911. Gave sooner appointment to see [place] [facility] on 09/02/2022 '@4'$ :00 pm and advised if symptoms get worse, to go to the ED for an evaluation. Verbalized understanding of plan.

## 2022-08-28 NOTE — Telephone Encounter (Signed)
Pt c/o of Chest Pain: STAT if CP now or developed within 24 hours  1. Are you having CP right now?   Light/uncomfortable chest pain  2. Are you experiencing any other symptoms (ex. SOB, nausea, vomiting, sweating)?   A little bit SOB and weakness/lightheaded  3. How long have you been experiencing CP?   About a week  4. Is your CP continuous or coming and going?   Coming and going  5. Have you taken Nitroglycerin?   Yes on Sunday  Patient stated the nitroglycerin did not have any effect on his chest pains. ?

## 2022-09-02 ENCOUNTER — Encounter: Payer: Self-pay | Admitting: Nurse Practitioner

## 2022-09-02 ENCOUNTER — Telehealth: Payer: Self-pay | Admitting: Nurse Practitioner

## 2022-09-02 ENCOUNTER — Ambulatory Visit: Payer: BC Managed Care – PPO | Attending: Cardiology | Admitting: Nurse Practitioner

## 2022-09-02 VITALS — BP 140/80 | HR 76 | Ht 75.0 in | Wt 290.2 lb

## 2022-09-02 DIAGNOSIS — I25119 Atherosclerotic heart disease of native coronary artery with unspecified angina pectoris: Secondary | ICD-10-CM

## 2022-09-02 DIAGNOSIS — I10 Essential (primary) hypertension: Secondary | ICD-10-CM

## 2022-09-02 DIAGNOSIS — R072 Precordial pain: Secondary | ICD-10-CM

## 2022-09-02 DIAGNOSIS — R0602 Shortness of breath: Secondary | ICD-10-CM

## 2022-09-02 DIAGNOSIS — E669 Obesity, unspecified: Secondary | ICD-10-CM

## 2022-09-02 DIAGNOSIS — Z8679 Personal history of other diseases of the circulatory system: Secondary | ICD-10-CM | POA: Diagnosis not present

## 2022-09-02 DIAGNOSIS — E785 Hyperlipidemia, unspecified: Secondary | ICD-10-CM

## 2022-09-02 MED ORDER — ISOSORBIDE MONONITRATE ER 60 MG PO TB24: 90.0000 mg | ORAL_TABLET | Freq: Every day | ORAL | 1 refills | Status: DC

## 2022-09-02 MED ORDER — NITROGLYCERIN 0.4 MG SL SUBL: 0.4000 mg | SUBLINGUAL_TABLET | SUBLINGUAL | 3 refills | Status: AC | PRN

## 2022-09-02 NOTE — Telephone Encounter (Signed)
Checking percert on the following patient for testing scheduled at [facility].   LEXISCAN    09-11-2022

## 2022-09-02 NOTE — Patient Instructions (Addendum)
Medication Instructions:  Your physician has recommended you make the following change in your medication:  INCREASE isosorbide to 90 mg (1.5 tablet) once a day Continue all other medications as directed  Labwork: none  Testing/Procedures: Your physician has requested that you have an echocardiogram. Echocardiography is a painless test that uses sound waves to create images of your heart. It provides your doctor with information about the size and shape of your heart and how well your heart's chambers and valves are working. This procedure takes approximately one hour. There are no restrictions for this procedure. Please do NOT wear cologne, perfume, aftershave, or lotions (deodorant is allowed). Please arrive 15 minutes prior to your appointment time.   Your physician has requested that you have a lexiscan myoview. For further information please visit [email or web address]. Please follow instruction sheet, as given.   Follow-Up:  Your physician recommends that you schedule a follow-up appointment in: 4-6 weeks  Any Other Special Instructions Will Be Listed Below (If Applicable).  If you need a refill on your cardiac medications before your next appointment, please call your pharmacy.

## 2022-09-02 NOTE — Progress Notes (Signed)
Office Visit    Patient Name: Jerome Shepherd Date of Encounter: 09/02/2022  PCP:  Curlene Labrum, [place]  Cardiologist:  Rozann Lesches, MD  Advanced Practice Provider:  Finis Bud, NP Electrophysiologist:  None   Chief Complaint    Jerome Shepherd is a 57 y.o. male with a hx of CAD, s/p DES to p/mid RCA in 2022, history of coronary vasospasm, HTN, T2DM, HLD, and GERD presents today for chest pain evaluation.   Past Medical History    Past Medical History:  Diagnosis Date   Antral erosion    EGD 12/2013   Arthritis    CAD (coronary artery disease)    DES to prox/mid RCA 09/2020 with medically managed moderate proximal LAD stenosis and occluded AV circumflex   Coronary vasospasm ([place])    Essential hypertension    GERD (gastroesophageal reflux disease)    Hyperlipidemia    Type 2 diabetes mellitus ([place])    Past Surgical History:  Procedure Laterality Date   BIOPSY  02/19/2018   Procedure: BIOPSY;  Surgeon: Daneil Dolin, MD;  [facility];  Service: Endoscopy;;  esophagus   BIOPSY  07/05/2021   Procedure: BIOPSY;  Surgeon: Daneil Dolin, MD;  [facility];  Service: Endoscopy;;   BIOPSY  04/02/2022   Procedure: BIOPSY;  Surgeon: Loney Laurence, DO;  Location: [facility];  Service: Gastroenterology;;   CARDIAC CATHETERIZATION  2013   non obstructive CAD  ~20%   CHOLECYSTECTOMY N/A 04/01/2014   Procedure: LAPAROSCOPIC CHOLECYSTECTOMY;  Surgeon: Jamesetta So, MD;  Location: [facility];  Service: General;  Laterality: N/A;   COLONOSCOPY WITH PROPOFOL N/A 08/22/2016   Procedure: COLONOSCOPY WITH PROPOFOL;  Surgeon: Daneil Dolin, MD;  [facility];  Service: Endoscopy;  Laterality: N/A;  730    CORONARY ATHERECTOMY N/A 10/05/2020   Procedure: CORONARY ATHERECTOMY;  Surgeon: Troy Sine, MD;  Location: [place] [facility];  Service: Cardiovascular;  Laterality: N/A;   ESOPHAGEAL MANOMETRY N/A  04/02/2022   Procedure: ESOPHAGEAL MANOMETRY (EM);  Surgeon: Loney Laurence, DO;  [facility];  Service: Gastroenterology;  Laterality: N/A;   ESOPHAGOGASTRODUODENOSCOPY N/A 01/10/2014   Dr. Gala Romney: antral erosions, path with chronic inactive gastritis. Empiric dilation with 56-F dilation   ESOPHAGOGASTRODUODENOSCOPY (EGD) WITH PROPOFOL N/A 02/16/2016   Dr. Gala Romney: small hiatal hernia, normal esopagus s/p empiric dilation   ESOPHAGOGASTRODUODENOSCOPY (EGD) WITH PROPOFOL N/A 02/19/2018   Procedure: ESOPHAGOGASTRODUODENOSCOPY (EGD) WITH PROPOFOL;  Surgeon: Daneil Dolin, MD;  [facility];  Service: Endoscopy;  Laterality: N/A;  10:15am   ESOPHAGOGASTRODUODENOSCOPY (EGD) WITH PROPOFOL N/A 07/05/2021   Procedure: ESOPHAGOGASTRODUODENOSCOPY (EGD) WITH PROPOFOL;  Surgeon: Daneil Dolin, MD;  [facility];  Service: Endoscopy;  Laterality: N/A;  Patient is on   ESOPHAGOGASTRODUODENOSCOPY (EGD) WITH PROPOFOL N/A 04/02/2022   Procedure: ESOPHAGOGASTRODUODENOSCOPY (EGD) WITH PROPOFOL;  Surgeon: Loney Laurence, DO;  [facility];  Service: Gastroenterology;  Laterality: N/A;   KNEE ARTHROSCOPY Right 2002   LEFT HEART CATH N/A 08/18/2011   Procedure: LEFT HEART CATH;  Surgeon: Lorretta Harp, MD;  Location: [facility];  Service: Cardiovascular;  Laterality: N/A;   LEFT HEART CATH AND CORONARY ANGIOGRAPHY N/A 10/04/2020   Procedure: LEFT HEART CATH AND CORONARY ANGIOGRAPHY;  Surgeon: Troy Sine, MD;  Location: [place] [facility];  Service: Cardiovascular;  Laterality: N/A;   LIVER BIOPSY N/A 04/01/2014  mild fibrosis   MALONEY DILATION N/A 01/10/2014   Procedure: MALONEY DILATION;  Surgeon: Daneil Dolin, MD;  [facility];  Service: Endoscopy;  Laterality: N/A;   MALONEY DILATION N/A 02/16/2016   Procedure: Venia Minks DILATION;  Surgeon: Daneil Dolin, MD;  [facility];  Service: Endoscopy;  Laterality: N/A;   MALONEY DILATION N/A  02/19/2018   Procedure: Venia Minks DILATION;  Surgeon: Daneil Dolin, MD;  [facility];  Service: Endoscopy;  Laterality: N/A;   POLYPECTOMY  08/22/2016   Procedure: POLYPECTOMY;  Surgeon: Daneil Dolin, MD;  [facility];  Service: Endoscopy;;   POLYPECTOMY  04/02/2022   Procedure: POLYPECTOMY;  Surgeon: Loney Laurence, DO;  [facility];  Service: Gastroenterology;;   Azzie Almas DILATION N/A 01/10/2014   Procedure: Azzie Almas DILATION;  Surgeon: Daneil Dolin, MD;  [facility];  Service: Endoscopy;  Laterality: N/A;   TEMPORARY PACEMAKER N/A 10/05/2020   Procedure: TEMPORARY PACEMAKER;  Surgeon: Troy Sine, MD;  Location: [place] [facility];  Service: Cardiovascular;  Laterality: N/A;    Allergies  Allergies  Allergen Reactions   Brilinta [Ticagrelor] Shortness Of Breath   Codeine Nausea Only   Diamox [Acetazolamide] Nausea Only    dizziness   Sulfa Antibiotics Nausea Only    History of Present Illness    Jerome Shepherd is a 57 y.o. male with a PMH as mentioned above.   Last seen by Dr. Domenic Polite on May 20, 2022.  Was doing well at that time.  Today he presents for chest pain evaluation.  He states chest pain started a few weeks ago, left side, described as burning/stinging/pinching, not always associated with exertion, stable over time, 5/10 in intensity, did come off atorvastatin recently has restarted this-tolerating well.  Does also experience some mild shortness of breath with his symptoms and left arm/shoulder pain with also pain in between his shoulder blades.  Symptoms are not as severe as his last heart attack. Denies any palpitations, syncope, presyncope, dizziness, orthopnea, PND, swelling or significant weight changes, acute bleeding, or claudication.  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 EKGs/Labs/Other Studies Reviewed:   The following studies were reviewed today:   EKG:  EKG is ordered  today.  The ekg ordered today demonstrates normal sinus rhythm, 69 bpm, incomplete LBBB, no acute ischemic changes.  Coronary atherectomy, Temporary pacemaker on October 05, 2020: Ost LM lesion is 30% stenosed. Mid LM to Dist LM lesion is 30% stenosed. Prox LAD lesion is 55% stenosed. Ost Cx to Prox Cx lesion is 99% stenosed. Prox Cx to Mid Cx lesion is 100% stenosed. Mid RCA lesion is 85% stenosed. Prox RCA-1 lesion is 50% stenosed. Prox RCA-2 lesion is 60% stenosed. A stent was successfully placed. Post intervention, there is a 0% residual stenosis. Post intervention, there is a 0% residual stenosis. Post intervention, there is a 0% residual stenosis.   Difficult but successful percutaneous coronary intervention of a diffusely calcified proximal to mid RCA with significant luminal calcification in the mid RCA with ultimate successful CSI coronary atherectomy, PTCA, and stenting with a 4.0 x 38 mm Resolute Onyx DES stent postdilated to 4.1 mm with the entire proximal to mid stenoses being reduced to 0%.  At the end of the procedure, there was brisk flow distally was now a normal-appearing distal PDA compared to the diagnostic study from yesterday.   Temporary transvenous pacemaker insertion paced prior  to atherectomy with utilization during the procedure.   RECOMMENDATION: DAPT for a minimum of a year.  Medical therapy for concomitant CAD with total occlusion of the AV groove circumflex, 30% ostial and distal left main stenoses, and 50 to 60% proximal LAD stenoses.  Optimal blood pressure control, lipid management, and weight loss.   Echocardiogram on October 04, 2020: 1. Left ventricular ejection fraction, by estimation, is 50 to 55%. The  left ventricle has low normal function. The left ventricle has no regional  wall motion abnormalities. There is mild concentric left ventricular  hypertrophy. Left ventricular  diastolic parameters are consistent with Grade II diastolic dysfunction   (pseudonormalization).   2. Right ventricular systolic function is normal. The right ventricular  size is normal. Tricuspid regurgitation signal is inadequate for assessing  PA pressure.   3. The mitral valve is normal in structure. Trivial mitral valve  regurgitation.   4. The aortic valve is tricuspid. Aortic valve regurgitation is trivial.  No aortic stenosis is present.   Comparison(s): No prior Echocardiogram.    LHC on 10/04/2020: Ost Cx to Prox Cx lesion is 99% stenosed. Prox Cx to Mid Cx lesion is 100% stenosed. Ost LM lesion is 30% stenosed. Mid LM to Dist LM lesion is 30% stenosed. Mid RCA lesion is 85% stenosed. Prox RCA-1 lesion is 50% stenosed. Prox RCA-2 lesion is 60% stenosed. Prox LAD lesion is 55% stenosed. RPDA lesion is 80% stenosed. The left ventricular systolic function is normal. The left ventricular ejection fraction is 50-55% by visual estimate. LV end diastolic pressure is low.   Multivessel CAD with smooth ostial narrowing of the left main and smooth distal left main stenosis prior to trifurcating into an LAD, ramus immediate vessel and high marginal vessel.   The LAD has very proximal 50 to 60% irregularity initiating just beyond the ostium.   The ramus immediate vessel is normal.   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   Large calcified dominant right coronary artery with superior takeoff and diffuse 5060% proximal stenosis, 90% stenosis in a small RV branch followed by at least 80% assist with an apparent chunk of calcium in this stenosis.  There is mild luminal irregularity of the RCA and it appears that there is very diffuse distal PDA stenosis of approximately 80% in  a small caliber distal PDA.   Low normal global LV function with EF estimated 50 to 55%.  There is a small focal area of mid anterolateral hypocontractility most likely contributed by the AV groove circumflex  occlusion.  LVEDP 7 mm Hg.   RECOMMENDATION: Angiograms will be reviewed with colleagues.  The RCA is diffusely diseased and appears to have significant calcification with very focal 85% calcified mid stenosis.  We will hydrate the patient following his catheterization which required increased contrast load secondary to difficulty in engaging his coronary arteries.  Tentative plan will be for atherectomy/PCI to the RCA tomorrow.   Recent Labs: No results found for requested labs within last 365 days.  Recent Lipid Panel    Component Value Date/Time   CHOL 118 10/06/2020 0352   TRIG 205 (H) 10/06/2020 0352   HDL 31 (L) 10/06/2020 0352   CHOLHDL 3.8 10/06/2020 0352   VLDL 41 (H) 10/06/2020 0352   LDLCALC 46 10/06/2020 0352    Home Medications   Current Meds  Medication Sig   amLODipine (NORVASC) 2.5 MG tablet Take 2.5 tablets by  mouth daily.   Ascorbic Acid (VITAMIN C PO) Take 2,000 mg by mouth daily.   aspirin EC 81 MG tablet Take 1 tablet (81 mg total) by mouth daily.   atorvastatin (LIPITOR) 40 MG tablet Take 1 tablet (40 mg total) by mouth daily.   Baclofen 5 MG TABS Take 1 tablet by mouth 3 (three) times daily.   carvedilol (COREG) 6.25 MG tablet Take 1 tablet (6.25 mg total) by mouth 2 (two) times daily.   cholecalciferol (VITAMIN D3) 25 MCG (1000 UNIT) tablet Take 1,000 Units by mouth daily.   dapagliflozin propanediol (FARXIGA) 10 MG TABS tablet Take 1 tablet (10 mg total) by mouth daily before breakfast.   Dulaglutide 3 MG/0.5ML SOPN Inject 3 mg into the skin every Sunday.   esomeprazole (NEXIUM) 40 MG capsule Take 40 mg by mouth 2 (two) times daily.   fluticasone (FLONASE) 50 MCG/ACT nasal spray Place 1 spray into both nostrils daily as needed for allergies or rhinitis.   loratadine-pseudoephedrine (CLARITIN-D 24-HOUR) 10-240 MG 24 hr tablet Take 1 tablet by mouth daily as needed for allergies.   losartan (COZAAR) 50 MG tablet TAKE 1 TABLET DAILY   Magnesium 500 MG TABS  Take 500 mg by mouth daily as needed (for leg cramps).    metFORMIN (GLUCOPHAGE) 500 MG tablet Take 500 mg by mouth 2 (two) times daily.    triamcinolone (KENALOG) 0.1 % paste Use as directed 1 Application in the mouth or throat as needed (mouth ulcer).    isosorbide mononitrate (IMDUR) 60 MG 24 hr tablet TAKE ONE TABLET BY MOUTH EVERY DAY PATIENT NEEDS OFFICE VISIT (Patient taking differently: Take 60 mg by mouth daily.)   nitroGLYCERIN (NITROSTAT) 0.4 MG SL tablet Place 1 tablet (0.4 mg total) under the tongue every 5 (five) minutes x 3 doses as needed for chest pain (if no relief after 2nd dose, proceed to ED or call 911).     Review of Systems    All other systems reviewed and are otherwise negative except as noted above.  Physical Exam    VS:  BP (!) 140/80   Pulse 76   Ht '6\' 3"'$  (1.905 m)   Wt 290 lb 3.2 oz (131.6 kg)   SpO2 96%   BMI 36.27 kg/m  , BMI Body mass index is 36.27 kg/m.  Wt Readings from Last 3 Encounters:  09/02/22 290 lb 3.2 oz (131.6 kg)  05/20/22 290 lb (131.5 kg)  04/02/22 280 lb (127 kg)     GEN: Well nourished, well developed, in no acute distress. HEENT: normal. Neck: Supple, no JVD, carotid bruits, or masses. Cardiac: S1/S2, RRR, no murmurs, rubs, or gallops. No clubbing, cyanosis, edema. Radials/PT 2+ and equal bilaterally.  Respiratory:  Respirations regular and unlabored, clear to auscultation bilaterally. MS: No deformity or atrophy. Skin: Warm and dry, no rash. Neuro:  Strength and sensation are intact. Psych: Normal affect.  Assessment & Plan    CAD, s/p DES to proximal and mid RCA in 2022, history of coronary vasospasm, chest pain, shortness of breath Recent symptoms for past few weeks, etiology multifactorial.  Mild shortness of breath with symptoms with exertion. EKG was negative today for any acute ischemic changes.  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  Will arrange Lexiscan and echocardiogram to evaluate for any wall motion abnormalities.   Will refill nitroglycerin and increase Imdur to 90 mg daily.  Continue rest of current medication regimen.  ED precautions discussed. Heart healthy diet and regular cardiovascular  exercise encouraged.   2.  Hypertension Blood pressure on arrival 140/80, repeat BP 135/62.  Increasing Imdur as mentioned above.  Continue rest of current medication regimen. Discussed to monitor BP at home at least 2 hours after medications and sitting for 5-10 minutes. Heart healthy diet and regular cardiovascular exercise encouraged.   3.  Hyperlipidemia Last LDL at goal.  Continue atorvastatin. Heart healthy diet and regular cardiovascular exercise encouraged.    4. Obesity BMI 36.27. Weight loss via diet and exercise encouraged. Discussed the impact being overweight would have on cardiovascular risk.  Disposition: Follow up in 4-6 week(s) with Rozann Lesches, MD or APP.  Signed, Finis Bud, NP 09/02/2022, [facility]

## 2022-09-04 ENCOUNTER — Ambulatory Visit: Payer: BC Managed Care – PPO | Attending: Nurse Practitioner

## 2022-09-04 DIAGNOSIS — R079 Chest pain, unspecified: Secondary | ICD-10-CM | POA: Diagnosis not present

## 2022-09-04 DIAGNOSIS — R0602 Shortness of breath: Secondary | ICD-10-CM

## 2022-09-04 MED ORDER — PERFLUTREN LIPID MICROSPHERE
1.0000 mL | INTRAVENOUS | Status: AC | PRN
  Administered 2022-09-04: 2 mL via INTRAVENOUS

## 2022-09-05 LAB — ECHOCARDIOGRAM COMPLETE
AR max vel: 2.29 cm2
AV Area VTI: 2.3 cm2
AV Area mean vel: 2.71 cm2
AV Mean grad: 3 mmHg
AV Peak grad: 6.4 mmHg
Ao pk vel: 1.26 m/s
Area-P 1/2: 2 cm2
Calc EF: 74.3 %
MV M vel: 2.31 m/s
MV Peak grad: 21.3 mmHg
S' Lateral: 2 cm
Single Plane A2C EF: 70.9 %
Single Plane A4C EF: 75.9 %

## 2022-09-10 ENCOUNTER — Ambulatory Visit: Payer: BC Managed Care – PPO | Admitting: Cardiology

## 2022-09-11 ENCOUNTER — Ambulatory Visit (HOSPITAL_COMMUNITY)
Admission: RE | Admit: 2022-09-11 | Discharge: 2022-09-11 | Disposition: A | Discharge status: Home / Self Care | Payer: BC Managed Care – PPO | Source: Ambulatory Visit | Attending: Cardiology | Admitting: Cardiology

## 2022-09-11 ENCOUNTER — Encounter (HOSPITAL_COMMUNITY)
Admission: RE | Admit: 2022-09-11 | Discharge: 2022-09-11 | Disposition: A | Discharge status: Home / Self Care | Payer: BC Managed Care – PPO | Source: Ambulatory Visit | Attending: Nurse Practitioner | Admitting: Nurse Practitioner

## 2022-09-11 ENCOUNTER — Encounter (HOSPITAL_COMMUNITY): Payer: Self-pay

## 2022-09-11 DIAGNOSIS — R072 Precordial pain: Secondary | ICD-10-CM

## 2022-09-11 LAB — NM MYOCAR MULTI W/SPECT W/WALL MOTION / EF
LV dias vol: 86 mL (ref 62–150)
LV sys vol: 28 mL
Nuc Stress EF: 67 %
Peak HR: 104 {beats}/min
RATE: 0.4
Rest HR: 75 {beats}/min
Rest Nuclear Isotope Dose: 11 mCi
SDS: 0
SRS: 2
SSS: 2
ST Depression (mm): 0 mm
Stress Nuclear Isotope Dose: 33 mCi
TID: 1.02

## 2022-09-11 MED ORDER — TECHNETIUM TC 99M TETROFOSMIN IV KIT
30.0000 | PACK | Freq: Once | INTRAVENOUS | Status: AC | PRN
  Administered 2022-09-11: 33 via INTRAVENOUS

## 2022-09-11 MED ORDER — SODIUM CHLORIDE FLUSH 0.9 % IV SOLN
INTRAVENOUS | Status: AC
  Administered 2022-09-11: 10 mL via INTRAVENOUS
  Filled 2022-09-11: qty 10

## 2022-09-11 MED ORDER — TECHNETIUM TC 99M TETROFOSMIN IV KIT
10.0000 | PACK | Freq: Once | INTRAVENOUS | Status: AC | PRN
  Administered 2022-09-11: 11 via INTRAVENOUS

## 2022-09-11 MED ORDER — REGADENOSON 0.4 MG/5ML IV SOLN
INTRAVENOUS | Status: AC
  Administered 2022-09-11: 0.4 mg via INTRAVENOUS
  Filled 2022-09-11: qty 5

## 2022-09-30 ENCOUNTER — Encounter: Payer: Self-pay | Admitting: Nurse Practitioner

## 2022-09-30 ENCOUNTER — Ambulatory Visit: Payer: BC Managed Care – PPO | Attending: Nurse Practitioner | Admitting: Nurse Practitioner

## 2022-09-30 VITALS — BP 122/82 | HR 86 | Ht 75.0 in | Wt 292.8 lb

## 2022-09-30 DIAGNOSIS — E785 Hyperlipidemia, unspecified: Secondary | ICD-10-CM

## 2022-09-30 DIAGNOSIS — Z8679 Personal history of other diseases of the circulatory system: Secondary | ICD-10-CM | POA: Diagnosis not present

## 2022-09-30 DIAGNOSIS — R0609 Other forms of dyspnea: Secondary | ICD-10-CM | POA: Diagnosis not present

## 2022-09-30 DIAGNOSIS — I10 Essential (primary) hypertension: Secondary | ICD-10-CM | POA: Diagnosis not present

## 2022-09-30 DIAGNOSIS — I2510 Atherosclerotic heart disease of native coronary artery without angina pectoris: Secondary | ICD-10-CM

## 2022-09-30 DIAGNOSIS — E669 Obesity, unspecified: Secondary | ICD-10-CM

## 2022-09-30 NOTE — Patient Instructions (Signed)

## 2022-09-30 NOTE — Progress Notes (Unsigned)
Office Visit    Patient Name: Jerome Shepherd Date of Encounter: 09/30/2022  PCP:  Juliette Alcide, MD   [facility]  Cardiologist:  Nona Dell, MD  Advanced Practice Provider:  Sharlene Dory, NP Electrophysiologist:  None   Chief Complaint    Jerome Shepherd is a 57 y.o. male with a hx of CAD, s/p DES to p/mid RCA in 2022, history of coronary vasospasm, HTN, T2DM, HLD, and GERD presents today for chest pain evaluation.   Past Medical History    Past Medical History:  Diagnosis Date   Antral erosion    EGD 12/2013   Arthritis    CAD (coronary artery disease)    DES to prox/mid RCA 09/2020 with medically managed moderate proximal LAD stenosis and occluded AV circumflex   Coronary vasospasm    Essential hypertension    GERD (gastroesophageal reflux disease)    Hyperlipidemia    Type 2 diabetes mellitus    Past Surgical History:  Procedure Laterality Date   BIOPSY  02/19/2018   Procedure: BIOPSY;  Surgeon: Corbin Ade, MD;  [facility]: [facility];  Service: Endoscopy;;  esophagus   BIOPSY  07/05/2021   Procedure: BIOPSY;  Surgeon: Corbin Ade, MD;  Location: [facility];  Service: Endoscopy;;   BIOPSY  04/02/2022   Procedure: BIOPSY;  Surgeon: Lynann Bologna, DO;  Location: [facility];  Service: Gastroenterology;;   CARDIAC CATHETERIZATION  2013   non obstructive CAD  ~20%   CHOLECYSTECTOMY N/A 04/01/2014   Procedure: LAPAROSCOPIC CHOLECYSTECTOMY;  Surgeon: Dalia Heading, MD;  Location: [facility];  Service: General;  Laterality: N/A;   COLONOSCOPY WITH PROPOFOL N/A 08/22/2016   Procedure: COLONOSCOPY WITH PROPOFOL;  Surgeon: Corbin Ade, MD;  [facility]: [facility];  Service: Endoscopy;  Laterality: N/A;  730    CORONARY ATHERECTOMY N/A 10/05/2020   Procedure: CORONARY ATHERECTOMY;  Surgeon: Lennette Bihari, MD;  Location: [facility] INVASIVE CV LAB;  Service: Cardiovascular;  Laterality: N/A;   ESOPHAGEAL MANOMETRY N/A 04/02/2022    Procedure: ESOPHAGEAL MANOMETRY (EM);  Surgeon: Lynann Bologna, DO;  Location: [facility];  Service: Gastroenterology;  Laterality: N/A;   ESOPHAGOGASTRODUODENOSCOPY N/A 01/10/2014   Dr. Jena Gauss: antral erosions, path with chronic inactive gastritis. Empiric dilation with 56-F dilation   ESOPHAGOGASTRODUODENOSCOPY (EGD) WITH PROPOFOL N/A 02/16/2016   Dr. Jena Gauss: small hiatal hernia, normal esopagus s/p empiric dilation   ESOPHAGOGASTRODUODENOSCOPY (EGD) WITH PROPOFOL N/A 02/19/2018   Procedure: ESOPHAGOGASTRODUODENOSCOPY (EGD) WITH PROPOFOL;  Surgeon: Corbin Ade, MD;  [facility];  Service: Endoscopy;  Laterality: N/A;  10:15am   ESOPHAGOGASTRODUODENOSCOPY (EGD) WITH PROPOFOL N/A 07/05/2021   Procedure: ESOPHAGOGASTRODUODENOSCOPY (EGD) WITH PROPOFOL;  Surgeon: Corbin Ade, MD;  [facility];  Service: Endoscopy;  Laterality: N/A;  Patient is on   ESOPHAGOGASTRODUODENOSCOPY (EGD) WITH PROPOFOL N/A 04/02/2022   Procedure: ESOPHAGOGASTRODUODENOSCOPY (EGD) WITH PROPOFOL;  Surgeon: Lynann Bologna, DO;  Location: [facility];  Service: Gastroenterology;  Laterality: N/A;   KNEE ARTHROSCOPY Right 2002   LEFT HEART CATH N/A 08/18/2011   Procedure: LEFT HEART CATH;  Surgeon: Runell Gess, MD;  Location: [facility] CATH LAB;  Service: Cardiovascular;  Laterality: N/A;   LEFT HEART CATH AND CORONARY ANGIOGRAPHY N/A 10/04/2020   Procedure: LEFT HEART CATH AND CORONARY ANGIOGRAPHY;  Surgeon: Lennette Bihari, MD;  Location: [facility] INVASIVE CV LAB;  Service: Cardiovascular;  Laterality: N/A;   LIVER BIOPSY N/A 04/01/2014  mild fibrosis   MALONEY DILATION N/A 01/10/2014   Procedure: MALONEY DILATION;  Surgeon: Corbin Ade, MD;  [facility];  Service: Endoscopy;  Laterality: N/A;   MALONEY DILATION N/A 02/16/2016   Procedure: Elease Hashimoto DILATION;  Surgeon: Corbin Ade, MD;  [facility];  Service: Endoscopy;  Laterality: N/A;   MALONEY DILATION N/A 02/19/2018    Procedure: Elease Hashimoto DILATION;  Surgeon: Corbin Ade, MD;  [facility];  Service: Endoscopy;  Laterality: N/A;   POLYPECTOMY  08/22/2016   Procedure: POLYPECTOMY;  Surgeon: Corbin Ade, MD;  [facility];  Service: Endoscopy;;   POLYPECTOMY  04/02/2022   Procedure: POLYPECTOMY;  Surgeon: Lynann Bologna, DO;  Location: [facility];  Service: Gastroenterology;;   Gaspar Bidding DILATION N/A 01/10/2014   Procedure: Gaspar Bidding DILATION;  Surgeon: Corbin Ade, MD;  [facility];  Service: Endoscopy;  Laterality: N/A;   TEMPORARY PACEMAKER N/A 10/05/2020   Procedure: TEMPORARY PACEMAKER;  Surgeon: Lennette Bihari, MD;  Location: [facility] INVASIVE CV LAB;  Service: Cardiovascular;  Laterality: N/A;    Allergies  Allergies  Allergen Reactions   Brilinta [Ticagrelor] Shortness Of Breath   Codeine Nausea Only   Diamox [Acetazolamide] Nausea Only    dizziness   Sulfa Antibiotics Nausea Only    History of Present Illness    Jerome Shepherd is a 57 y.o. male with a PMH as mentioned above.   Last seen by Dr. Diona Browner on May 20, 2022.  Was doing well at that time.  Today he presents for chest pain evaluation.  He states chest pain started a few weeks ago, left side, described as burning/stinging/pinching, not always associated with exertion, stable over time, 5/10 in intensity, did come off atorvastatin recently has restarted this-tolerating well.  Does also experience some mild shortness of breath with his symptoms and left arm/shoulder pain with also pain in between his shoulder blades.  Symptoms are not as severe as his last heart attack. Denies any palpitations, syncope, presyncope, dizziness, orthopnea, PND, swelling or significant weight changes, acute bleeding, or claudication.  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 EKGs/Labs/Other Studies Reviewed:   The following studies were reviewed today:   EKG:  EKG is ordered today.  The ekg  ordered today demonstrates normal sinus rhythm, 69 bpm, incomplete LBBB, no acute ischemic changes.  Coronary atherectomy, Temporary pacemaker on October 05, 2020: Ost LM lesion is 30% stenosed. Mid LM to Dist LM lesion is 30% stenosed. Prox LAD lesion is 55% stenosed. Ost Cx to Prox Cx lesion is 99% stenosed. Prox Cx to Mid Cx lesion is 100% stenosed. Mid RCA lesion is 85% stenosed. Prox RCA-1 lesion is 50% stenosed. Prox RCA-2 lesion is 60% stenosed. A stent was successfully placed. Post intervention, there is a 0% residual stenosis. Post intervention, there is a 0% residual stenosis. Post intervention, there is a 0% residual stenosis.   Difficult but successful percutaneous coronary intervention of a diffusely calcified proximal to mid RCA with significant luminal calcification in the mid RCA with ultimate successful CSI coronary atherectomy, PTCA, and stenting with a 4.0 x 38 mm Resolute Onyx DES stent postdilated to 4.1 mm with the entire proximal to mid stenoses being reduced to 0%.  At the end of the procedure, there was brisk flow distally was now a normal-appearing distal PDA compared to the diagnostic study from yesterday.   Temporary transvenous pacemaker insertion paced prior  to atherectomy with utilization during the procedure.   RECOMMENDATION: DAPT for a minimum of a year.  Medical therapy for concomitant CAD with total occlusion of the AV groove circumflex, 30% ostial and distal left main stenoses, and 50 to 60% proximal LAD stenoses.  Optimal blood pressure control, lipid management, and weight loss.   Echocardiogram on October 04, 2020: 1. Left ventricular ejection fraction, by estimation, is 50 to 55%. The  left ventricle has low normal function. The left ventricle has no regional  wall motion abnormalities. There is mild concentric left ventricular  hypertrophy. Left ventricular  diastolic parameters are consistent with Grade II diastolic dysfunction   (pseudonormalization).   2. Right ventricular systolic function is normal. The right ventricular  size is normal. Tricuspid regurgitation signal is inadequate for assessing  PA pressure.   3. The mitral valve is normal in structure. Trivial mitral valve  regurgitation.   4. The aortic valve is tricuspid. Aortic valve regurgitation is trivial.  No aortic stenosis is present.   Comparison(s): No prior Echocardiogram.    LHC on 10/04/2020: Ost Cx to Prox Cx lesion is 99% stenosed. Prox Cx to Mid Cx lesion is 100% stenosed. Ost LM lesion is 30% stenosed. Mid LM to Dist LM lesion is 30% stenosed. Mid RCA lesion is 85% stenosed. Prox RCA-1 lesion is 50% stenosed. Prox RCA-2 lesion is 60% stenosed. Prox LAD lesion is 55% stenosed. RPDA lesion is 80% stenosed. The left ventricular systolic function is normal. The left ventricular ejection fraction is 50-55% by visual estimate. LV end diastolic pressure is low.   Multivessel CAD with smooth ostial narrowing of the left main and smooth distal left main stenosis prior to trifurcating into an LAD, ramus immediate vessel and high marginal vessel.   The LAD has very proximal 50 to 60% irregularity initiating just beyond the ostium.   The ramus immediate vessel is normal.   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   Large calcified dominant right coronary artery with superior takeoff and diffuse 5060% proximal stenosis, 90% stenosis in a small RV branch followed by at least 80% assist with an apparent chunk of calcium in this stenosis.  There is mild luminal irregularity of the RCA and it appears that there is very diffuse distal PDA stenosis of approximately 80% in  a small caliber distal PDA.   Low normal global LV function with EF estimated 50 to 55%.  There is a small focal area of mid anterolateral hypocontractility most likely contributed by the AV groove circumflex  occlusion.  LVEDP 7 mm Hg.   RECOMMENDATION: Angiograms will be reviewed with colleagues.  The RCA is diffusely diseased and appears to have significant calcification with very focal 85% calcified mid stenosis.  We will hydrate the patient following his catheterization which required increased contrast load secondary to difficulty in engaging his coronary arteries.  Tentative plan will be for atherectomy/PCI to the RCA tomorrow.   Recent Labs: No results found for requested labs within last 365 days.  Recent Lipid Panel    Component Value Date/Time   CHOL 118 10/06/2020 0352   TRIG 205 (H) 10/06/2020 0352   HDL 31 (L) 10/06/2020 0352   CHOLHDL 3.8 10/06/2020 0352   VLDL 41 (H) 10/06/2020 0352   LDLCALC 46 10/06/2020 0352    Home Medications   Current Meds  Medication Sig   amLODipine (NORVASC) 2.5 MG tablet Take 2.5 tablets by  mouth daily.   Ascorbic Acid (VITAMIN C PO) Take 2,000 mg by mouth daily.   aspirin EC 81 MG tablet Take 1 tablet (81 mg total) by mouth daily.   atorvastatin (LIPITOR) 40 MG tablet Take 1 tablet (40 mg total) by mouth daily.   Baclofen 5 MG TABS Take 1 tablet by mouth 3 (three) times daily.   carvedilol (COREG) 6.25 MG tablet Take 1 tablet (6.25 mg total) by mouth 2 (two) times daily.   cholecalciferol (VITAMIN D3) 25 MCG (1000 UNIT) tablet Take 1,000 Units by mouth daily.   dapagliflozin propanediol (FARXIGA) 10 MG TABS tablet Take 1 tablet (10 mg total) by mouth daily before breakfast.   Dulaglutide 3 MG/0.5ML SOPN Inject 3 mg into the skin every Sunday.   esomeprazole (NEXIUM) 40 MG capsule Take 40 mg by mouth 2 (two) times daily.   fluticasone (FLONASE) 50 MCG/ACT nasal spray Place 1 spray into both nostrils daily as needed for allergies or rhinitis.   loratadine-pseudoephedrine (CLARITIN-D 24-HOUR) 10-240 MG 24 hr tablet Take 1 tablet by mouth daily as needed for allergies.   losartan (COZAAR) 50 MG tablet TAKE 1 TABLET DAILY   Magnesium 500 MG TABS  Take 500 mg by mouth daily as needed (for leg cramps).    metFORMIN (GLUCOPHAGE) 500 MG tablet Take 500 mg by mouth 2 (two) times daily.    triamcinolone (KENALOG) 0.1 % paste Use as directed 1 Application in the mouth or throat as needed (mouth ulcer).    isosorbide mononitrate (IMDUR) 60 MG 24 hr tablet TAKE ONE TABLET BY MOUTH EVERY DAY PATIENT NEEDS OFFICE VISIT (Patient taking differently: Take 60 mg by mouth daily.)   nitroGLYCERIN (NITROSTAT) 0.4 MG SL tablet Place 1 tablet (0.4 mg total) under the tongue every 5 (five) minutes x 3 doses as needed for chest pain (if no relief after 2nd dose, proceed to ED or call 911).     Review of Systems    All other systems reviewed and are otherwise negative except as noted above.  Physical Exam    VS:  BP 122/82   Pulse 86   Ht 6\' 3"  (1.905 m)   Wt 292 lb 12.8 oz (132.8 kg)   SpO2 95%   BMI 36.60 kg/m  , BMI Body mass index is 36.6 kg/m.  Wt Readings from Last 3 Encounters:  09/30/22 292 lb 12.8 oz (132.8 kg)  09/02/22 290 lb 3.2 oz (131.6 kg)  05/20/22 290 lb (131.5 kg)     GEN: Well nourished, well developed, in no acute distress. HEENT: normal. Neck: Supple, no JVD, carotid bruits, or masses. Cardiac: S1/S2, RRR, no murmurs, rubs, or gallops. No clubbing, cyanosis, edema. Radials/PT 2+ and equal bilaterally.  Respiratory:  Respirations regular and unlabored, clear to auscultation bilaterally. MS: No deformity or atrophy. Skin: Warm and dry, no rash. Neuro:  Strength and sensation are intact. Psych: Normal affect.  Assessment & Plan    CAD, s/p DES to proximal and mid RCA in 2022, history of coronary vasospasm, chest pain, shortness of breath Recent symptoms for past few weeks, etiology multifactorial.  Mild shortness of breath with symptoms with exertion. EKG was negative today for any acute ischemic changes.  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  Will arrange Lexiscan and echocardiogram to evaluate for any wall motion  abnormalities.  Will refill nitroglycerin and increase Imdur to 90 mg daily.  Continue rest of current medication regimen.  ED precautions discussed. Heart healthy diet and regular  cardiovascular exercise encouraged.   2.  Hypertension Blood pressure on arrival 140/80, repeat BP 135/62.  Increasing Imdur as mentioned above.  Continue rest of current medication regimen. Discussed to monitor BP at home at least 2 hours after medications and sitting for 5-10 minutes. Heart healthy diet and regular cardiovascular exercise encouraged.   3.  Hyperlipidemia Last LDL at goal.  Continue atorvastatin. Heart healthy diet and regular cardiovascular exercise encouraged.    4. Obesity BMI 36.27. Weight loss via diet and exercise encouraged. Discussed the impact being overweight would have on cardiovascular risk.  Disposition: Follow up in 4-6 week(s) with Nona DellSamuel McDowell, MD or APP.  Signed, Sharlene DoryElizabeth Vuk Skillern, NP 09/30/2022, 11:52 AM [facility]

## 2022-10-08 ENCOUNTER — Other Ambulatory Visit: Payer: Self-pay | Admitting: Cardiology

## 2022-12-02 ENCOUNTER — Ambulatory Visit: Payer: BC Managed Care – PPO | Admitting: Cardiology

## 2023-02-13 ENCOUNTER — Other Ambulatory Visit: Payer: Self-pay | Admitting: Nurse Practitioner

## 2023-03-31 ENCOUNTER — Ambulatory Visit: Payer: BC Managed Care – PPO | Admitting: Nurse Practitioner

## 2023-04-04 ENCOUNTER — Other Ambulatory Visit: Payer: Self-pay | Admitting: Cardiology

## 2023-05-09 ENCOUNTER — Ambulatory Visit: Payer: BC Managed Care – PPO | Attending: Nurse Practitioner | Admitting: Nurse Practitioner

## 2023-05-09 ENCOUNTER — Encounter: Payer: Self-pay | Admitting: Nurse Practitioner

## 2023-05-09 VITALS — BP 128/82 | HR 87 | Ht 75.0 in | Wt 292.2 lb

## 2023-05-09 DIAGNOSIS — I25119 Atherosclerotic heart disease of native coronary artery with unspecified angina pectoris: Secondary | ICD-10-CM | POA: Diagnosis not present

## 2023-05-09 DIAGNOSIS — I10 Essential (primary) hypertension: Secondary | ICD-10-CM | POA: Diagnosis not present

## 2023-05-09 DIAGNOSIS — R0609 Other forms of dyspnea: Secondary | ICD-10-CM

## 2023-05-09 DIAGNOSIS — R079 Chest pain, unspecified: Secondary | ICD-10-CM

## 2023-05-09 DIAGNOSIS — E669 Obesity, unspecified: Secondary | ICD-10-CM

## 2023-05-09 DIAGNOSIS — E785 Hyperlipidemia, unspecified: Secondary | ICD-10-CM

## 2023-05-09 NOTE — Patient Instructions (Signed)
Medication Instructions:   Increase your Imdur to 1 1/2 tabs daily  Continue all other medications.     Labwork:  none  Testing/Procedures:  none  Follow-Up:  3 months   Any Other Special Instructions Will Be Listed Below (If Applicable).   If you need a refill on your cardiac medications before your next appointment, please call your pharmacy.

## 2023-05-09 NOTE — Progress Notes (Unsigned)
Office Visit    Patient Name: Jerome Shepherd Date of Encounter: 09/30/2022  PCP:  Juliette Alcide, MD   [facility]  Cardiologist:  Nona Dell, MD  Advanced Practice Provider:  Sharlene Dory, NP Electrophysiologist:  None   Chief Complaint    Jerome Shepherd is a 56 y.o. male with a hx of CAD, s/p DES to p/mid RCA in 2022, history of coronary vasospasm, HTN, T2DM, HLD, and GERD presents today for chest pain evaluation follow-up.   Past Medical History    Past Medical History:  Diagnosis Date   Antral erosion    EGD 12/2013   Arthritis    CAD (coronary artery disease)    DES to prox/mid RCA 09/2020 with medically managed moderate proximal LAD stenosis and occluded AV circumflex   Coronary vasospasm (HCC)    Essential hypertension    GERD (gastroesophageal reflux disease)    Hyperlipidemia    Type 2 diabetes mellitus (HCC)    Past Surgical History:  Procedure Laterality Date   BIOPSY  02/19/2018   Procedure: BIOPSY;  Surgeon: Corbin Ade, MD;  [facility];  Service: Endoscopy;;  esophagus   BIOPSY  07/05/2021   Procedure: BIOPSY;  Surgeon: Corbin Ade, MD;  [facility];  Service: Endoscopy;;   BIOPSY  04/02/2022   Procedure: BIOPSY;  Surgeon: Lynann Bologna, DO;  Location: [facility];  Service: Gastroenterology;;   CARDIAC CATHETERIZATION  2013   non obstructive CAD  ~20%   CHOLECYSTECTOMY N/A 04/01/2014   Procedure: LAPAROSCOPIC CHOLECYSTECTOMY;  Surgeon: Dalia Heading, MD;  Location: [facility];  Service: General;  Laterality: N/A;   COLONOSCOPY WITH PROPOFOL N/A 08/22/2016   Procedure: COLONOSCOPY WITH PROPOFOL;  Surgeon: Corbin Ade, MD;  [facility];  Service: Endoscopy;  Laterality: N/A;  730    CORONARY ATHERECTOMY N/A 10/05/2020   Procedure: CORONARY ATHERECTOMY;  Surgeon: Lennette Bihari, MD;  Location: [facility] CV LAB;  Service: Cardiovascular;  Laterality: N/A;   ESOPHAGEAL  MANOMETRY N/A 04/02/2022   Procedure: ESOPHAGEAL MANOMETRY (EM);  Surgeon: Lynann Bologna, DO;  [facility];  Service: Gastroenterology;  Laterality: N/A;   ESOPHAGOGASTRODUODENOSCOPY N/A 01/10/2014   Dr. Jena Gauss: antral erosions, path with chronic inactive gastritis. Empiric dilation with 56-F dilation   ESOPHAGOGASTRODUODENOSCOPY (EGD) WITH PROPOFOL N/A 02/16/2016   Dr. Jena Gauss: small hiatal hernia, normal esopagus s/p empiric dilation   ESOPHAGOGASTRODUODENOSCOPY (EGD) WITH PROPOFOL N/A 02/19/2018   Procedure: ESOPHAGOGASTRODUODENOSCOPY (EGD) WITH PROPOFOL;  Surgeon: Corbin Ade, MD;  [facility];  Service: Endoscopy;  Laterality: N/A;  10:15am   ESOPHAGOGASTRODUODENOSCOPY (EGD) WITH PROPOFOL N/A 07/05/2021   Procedure: ESOPHAGOGASTRODUODENOSCOPY (EGD) WITH PROPOFOL;  Surgeon: Corbin Ade, MD;  [facility];  Service: Endoscopy;  Laterality: N/A;  Patient is on   ESOPHAGOGASTRODUODENOSCOPY (EGD) WITH PROPOFOL N/A 04/02/2022   Procedure: ESOPHAGOGASTRODUODENOSCOPY (EGD) WITH PROPOFOL;  Surgeon: Lynann Bologna, DO;  [facility];  Service: Gastroenterology;  Laterality: N/A;   KNEE ARTHROSCOPY Right 2002   LEFT HEART CATH N/A 08/18/2011   Procedure: LEFT HEART CATH;  Surgeon: Runell Gess, MD;  Location: [facility] CATH LAB;  Service: Cardiovascular;  Laterality: N/A;   LEFT HEART CATH AND CORONARY ANGIOGRAPHY N/A 10/04/2020   Procedure: LEFT HEART CATH AND CORONARY ANGIOGRAPHY;  Surgeon: Lennette Bihari, MD;  Location: [facility] INVASIVE CV LAB;  Service: Cardiovascular;  Laterality: N/A;   LIVER BIOPSY N/A  04/01/2014   mild fibrosis   MALONEY DILATION N/A 01/10/2014   Procedure: Elease Hashimoto DILATION;  Surgeon: Corbin Ade, MD;  [facility];  Service: Endoscopy;  Laterality: N/A;   MALONEY DILATION N/A 02/16/2016   Procedure: Elease Hashimoto DILATION;  Surgeon: Corbin Ade, MD;  [facility];  Service: Endoscopy;  Laterality: N/A;    MALONEY DILATION N/A 02/19/2018   Procedure: Elease Hashimoto DILATION;  Surgeon: Corbin Ade, MD;  [facility];  Service: Endoscopy;  Laterality: N/A;   POLYPECTOMY  08/22/2016   Procedure: POLYPECTOMY;  Surgeon: Corbin Ade, MD;  [facility];  Service: Endoscopy;;   POLYPECTOMY  04/02/2022   Procedure: POLYPECTOMY;  Surgeon: Lynann Bologna, DO;  [facility];  Service: Gastroenterology;;   Gaspar Bidding DILATION N/A 01/10/2014   Procedure: Gaspar Bidding DILATION;  Surgeon: Corbin Ade, MD;  [facility];  Service: Endoscopy;  Laterality: N/A;   TEMPORARY PACEMAKER N/A 10/05/2020   Procedure: TEMPORARY PACEMAKER;  Surgeon: Lennette Bihari, MD;  Location: [facility] INVASIVE CV LAB;  Service: Cardiovascular;  Laterality: N/A;    Allergies  Allergies  Allergen Reactions   Brilinta [Ticagrelor] Shortness Of Breath   Codeine Nausea Only   Diamox [Acetazolamide] Nausea Only    dizziness   Sulfa Antibiotics Nausea Only    History of Present Illness    Jerome Shepherd is a 57 y.o. male with a PMH as mentioned above.   Last seen by Dr. Diona Browner on May 20, 2022.  Was doing well at that time.  I last saw him for chest pain evaluation, left-sided. Was described as burning/stinging/pinching, not always associated with exertion, stable over time, 5/10 in intensity. Noted some mild shortness of breath with his symptoms and left arm/shoulder pain with also pain in between his shoulder blades.  Symptoms were not as severe as his last heart attack. 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 Updated Lexiscan and Echo - results noted below.   Today he presents for follow-up. Doing well and doing much better per his report. Denies any chest pain, palpitations, syncope, presyncope, dizziness, orthopnea, PND, swelling or significant weight changes, acute bleeding, or claudication. Admits to shortness of breath noticed when walking uphill, attributes this to his weight, planning on losing  weight.   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 EKGs/Labs/Other Studies Reviewed:   The following studies were reviewed today:   EKG:  EKG is not ordered today.    Lexiscan 08/2022:    Findings are consistent with no ischemia. The study is low risk.   No ST deviation was noted.   Left ventricular function is normal. End diastolic cavity size is normal. End systolic cavity size is normal.   Prior study available for comparison. No changes compared to prior study.   Small apical inferior perfusion defect in rest and stress but with normal wall motion.  Apical artifact favored vs small RCA infarct.  No ischemia. Low risk study, similar to 2021.   Echo 08/2022: 1. Left ventricular ejection fraction, by estimation, is 60 to 65%. The  left ventricle has normal function. The left ventricle has no regional  wall motion abnormalities. There is moderate left ventricular hypertrophy.  Left ventricular diastolic  parameters were normal. The average left ventricular global longitudinal  strain is -22.0 %. The global longitudinal strain is normal.   2. Right ventricular systolic function is normal. The right ventricular  size is normal. Tricuspid regurgitation signal is inadequate for assessing  PA pressure.   3. The mitral valve is normal in structure. No evidence of mitral valve  regurgitation. No evidence of mitral stenosis.   4. The aortic valve is tricuspid. Aortic valve regurgitation is not  visualized. No aortic stenosis is present.   5. The inferior vena cava is normal in size with greater than 50%  respiratory variability, suggesting right atrial pressure of 3 mmHg.   Comparison(s): Echocardiogram done 10/04/20 showed an EF of 50-55%.  Coronary atherectomy, Temporary pacemaker on October 05, 2020: Ost LM lesion is 30% stenosed. Mid LM to Dist LM lesion is 30% stenosed. Prox LAD lesion is 55% stenosed. Ost Cx to Prox Cx lesion is 99% stenosed. Prox Cx to Mid Cx lesion is 100%  stenosed. Mid RCA lesion is 85% stenosed. Prox RCA-1 lesion is 50% stenosed. Prox RCA-2 lesion is 60% stenosed. A stent was successfully placed. Post intervention, there is a 0% residual stenosis. Post intervention, there is a 0% residual stenosis. Post intervention, there is a 0% residual stenosis.   Difficult but successful percutaneous coronary intervention of a diffusely calcified proximal to mid RCA with significant luminal calcification in the mid RCA with ultimate successful CSI coronary atherectomy, PTCA, and stenting with a 4.0 x 38 mm Resolute Onyx DES stent postdilated to 4.1 mm with the entire proximal to mid stenoses being reduced to 0%.  At the end of the procedure, there was brisk flow distally was now a normal-appearing distal PDA compared to the diagnostic study from yesterday.   Temporary transvenous pacemaker insertion paced prior to atherectomy with utilization during the procedure.   RECOMMENDATION: DAPT for a minimum of a year.  Medical therapy for concomitant CAD with total occlusion of the AV groove circumflex, 30% ostial and distal left main stenoses, and 50 to 60% proximal LAD stenoses.  Optimal blood pressure control, lipid management, and weight loss.   Echocardiogram on October 04, 2020: 1. Left ventricular ejection fraction, by estimation, is 50 to 55%. The  left ventricle has low normal function. The left ventricle has no regional  wall motion abnormalities. There is mild concentric left ventricular  hypertrophy. Left ventricular  diastolic parameters are consistent with Grade II diastolic dysfunction  (pseudonormalization).   2. Right ventricular systolic function is normal. The right ventricular  size is normal. Tricuspid regurgitation signal is inadequate for assessing  PA pressure.   3. The mitral valve is normal in structure. Trivial mitral valve  regurgitation.   4. The aortic valve is tricuspid. Aortic valve regurgitation is trivial.  No aortic  stenosis is present.   Comparison(s): No prior Echocardiogram.    LHC on 10/04/2020: Ost Cx to Prox Cx lesion is 99% stenosed. Prox Cx to Mid Cx lesion is 100% stenosed. Ost LM lesion is 30% stenosed. Mid LM to Dist LM lesion is 30% stenosed. Mid RCA lesion is 85% stenosed. Prox RCA-1 lesion is 50% stenosed. Prox RCA-2 lesion is 60% stenosed. Prox LAD lesion is 55% stenosed. RPDA lesion is 80% stenosed. The left ventricular systolic function is normal. The left ventricular ejection fraction is 50-55% by visual estimate. LV end diastolic pressure is low.   Multivessel CAD with smooth ostial narrowing of the left main and smooth distal left main stenosis prior to trifurcating into an LAD, ramus immediate vessel and high marginal vessel.   The LAD has very proximal 50 to 60% irregularity initiating just beyond the ostium.   The ramus immediate vessel is normal.   The AV groove circumflex is  totally occluded and arises from a very high marginal or additional ramus-like vessel.   Large calcified dominant right coronary artery with superior takeoff and diffuse 5060% proximal stenosis, 90% stenosis in a small RV branch followed by at least 80% assist with an apparent chunk of calcium in this stenosis.  There is mild luminal irregularity of the RCA and it appears that there is very diffuse distal PDA stenosis of approximately 80% in  a small caliber distal PDA.   Low normal global LV function with EF estimated 50 to 55%.  There is a small focal area of mid anterolateral hypocontractility most likely contributed by the AV groove circumflex occlusion.  LVEDP 7 mm Hg.   RECOMMENDATION: Angiograms will be reviewed with colleagues.  The RCA is diffusely diseased and appears to have significant calcification with very focal 85% calcified mid stenosis.  We will hydrate the patient following his catheterization which required increased contrast load secondary to difficulty in engaging his coronary  arteries.  Tentative plan will be for atherectomy/PCI to the RCA tomorrow.   Recent Labs: No results found for requested labs within last 365 days.  Recent Lipid Panel    Component Value Date/Time   CHOL 118 10/06/2020 0352   TRIG 205 (H) 10/06/2020 0352   HDL 31 (L) 10/06/2020 0352   CHOLHDL 3.8 10/06/2020 0352   VLDL 41 (H) 10/06/2020 0352   LDLCALC 46 10/06/2020 0352    Home Medications   Current Meds  Medication Sig   amLODipine (NORVASC) 2.5 MG tablet Take 2.5 tablets by mouth daily.   Ascorbic Acid (VITAMIN C PO) Take 2,000 mg by mouth daily.   aspirin EC 81 MG tablet Take 1 tablet (81 mg total) by mouth daily.   atorvastatin (LIPITOR) 40 MG tablet Take 1 tablet (40 mg total) by mouth daily.   Baclofen 5 MG TABS Take 1 tablet by mouth 3 (three) times daily.   carvedilol (COREG) 6.25 MG tablet Take 1 tablet (6.25 mg total) by mouth 2 (two) times daily.   cholecalciferol (VITAMIN D3) 25 MCG (1000 UNIT) tablet Take 1,000 Units by mouth daily.   dapagliflozin propanediol (FARXIGA) 10 MG TABS tablet Take 1 tablet (10 mg total) by mouth daily before breakfast.   Dulaglutide 3 MG/0.5ML SOPN Inject 3 mg into the skin every Sunday.   esomeprazole (NEXIUM) 40 MG capsule Take 40 mg by mouth 2 (two) times daily.   fluticasone (FLONASE) 50 MCG/ACT nasal spray Place 1 spray into both nostrils daily as needed for allergies or rhinitis.   isosorbide mononitrate (IMDUR) 60 MG 24 hr tablet Take 1.5 tablets (90 mg total) by mouth daily.   loratadine-pseudoephedrine (CLARITIN-D 24-HOUR) 10-240 MG 24 hr tablet Take 1 tablet by mouth daily as needed for allergies.   losartan (COZAAR) 50 MG tablet TAKE 1 TABLET DAILY   Magnesium 500 MG TABS Take 500 mg by mouth daily as needed (for leg cramps).    metFORMIN (GLUCOPHAGE) 500 MG tablet Take 500 mg by mouth 2 (two) times daily.    nitroGLYCERIN (NITROSTAT) 0.4 MG SL tablet Place 1 tablet (0.4 mg total) under the tongue every 5 (five) minutes x 3 doses  as needed for chest pain (if no relief after 2nd dose, proceed to ED or call 911).   triamcinolone (KENALOG) 0.1 % paste Use as directed 1 Application in the mouth or throat as needed (mouth ulcer).    Review of Systems    All other systems reviewed and are otherwise negative  except as noted above.  Physical Exam    VS:  BP 128/82   Pulse 87   Ht 6\' 3"  (1.905 m)   Wt 292 lb 3.2 oz (132.5 kg)   SpO2 95%   BMI 36.52 kg/m  , BMI Body mass index is 36.52 kg/m.  Wt Readings from Last 3 Encounters:  05/09/23 292 lb 3.2 oz (132.5 kg)  09/30/22 292 lb 12.8 oz (132.8 kg)  09/02/22 290 lb 3.2 oz (131.6 kg)     GEN: Obese, 57 y.o. male in no acute distress. HEENT: normal. Neck: Supple, no JVD, carotid bruits, or masses. Cardiac: S1/S2, RRR, no murmurs, rubs, or gallops. No clubbing, cyanosis, edema. Radials/PT 2+ and equal bilaterally.  Respiratory:  Respirations regular and unlabored, clear to auscultation bilaterally. MS: No deformity or atrophy. Skin: Warm and dry, no rash. Neuro:  Strength and sensation are intact. Psych: Normal affect.  Assessment & Plan    CAD, s/p DES to proximal and mid RCA in 2022, history of coronary vasospasm, DOE Denies any recent chest pain.  Mild shortness of breath with exertion, attributes to weight. Last office EKG negative for any acute ischemic changes, Myoview negative for ischemia, Echo unremarkable.  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 Continue current medication regimen.  ED precautions discussed. Heart healthy diet and regular cardiovascular exercise encouraged.   2.  Hypertension Blood pressure stable. Continue current medication regimen. Discussed to monitor BP at home at least 2 hours after medications and sitting for 5-10 minutes. Heart healthy diet and regular cardiovascular exercise encouraged.   3.  Hyperlipidemia Last LDL at goal.  Continue atorvastatin. Heart healthy diet and regular  cardiovascular exercise encouraged.    4. Obesity BMI 36.60. Weight loss via diet and exercise encouraged. Discussed the impact being overweight would have on cardiovascular risk. Discussed PREP program, currently declines. Will discuss weight loss goal with his PCP.   Taking only 1 tablet of Imdur -> 1.5 tablets of Imdur.  Defer labs to PCP.  3 months f/u.   Mid chest pain, 2/10.  Tightness 4.  Intermittent - since cold weather.  Last week has gotten worse.  Just center.     Disposition: Follow up in 6 months with Nona Dell, MD or APP.  Signed, Sharlene Dory, NP 05/09/2023, 1:10 PM [facility]

## 2023-07-03 ENCOUNTER — Other Ambulatory Visit: Payer: Self-pay | Admitting: Cardiology

## 2023-08-01 ENCOUNTER — Ambulatory Visit: Payer: BC Managed Care – PPO | Admitting: Nurse Practitioner

## 2023-08-05 ENCOUNTER — Telehealth: Payer: Self-pay | Admitting: Nurse Practitioner

## 2023-08-05 ENCOUNTER — Other Ambulatory Visit: Payer: Self-pay | Admitting: *Deleted

## 2023-08-05 MED ORDER — ISOSORBIDE MONONITRATE ER 60 MG PO TB24: 90.0000 mg | ORAL_TABLET | Freq: Every day | ORAL | 0 refills | Status: AC

## 2023-08-05 MED ORDER — LOSARTAN POTASSIUM 50 MG PO TABS: 50.0000 mg | ORAL_TABLET | Freq: Every day | ORAL | 0 refills | Status: DC

## 2023-08-05 NOTE — Telephone Encounter (Signed)
Refills sent

## 2023-08-05 NOTE — Telephone Encounter (Signed)
*  STAT* If patient is at the pharmacy, call can be transferred to refill team.   1. Which medications need to be refilled? (please list name of each medication and dose if known) .   isosorbide mononitrate (IMDUR) 60 MG 24 hr tablet  losartan (COZAAR) 50 MG tablet      4. Which pharmacy/location (including street and city if local pharmacy) is medication to be sent to?  [facility] - [place], [place] - [address] [facility] AT Portal to [facility] Phone: [phone]  Fax: [phone]       5. Do they need a 30 day or 90 day supply? 90

## 2023-09-02 ENCOUNTER — Encounter: Payer: Self-pay | Admitting: Cardiology

## 2023-09-02 ENCOUNTER — Ambulatory Visit: Attending: Cardiology | Admitting: Cardiology

## 2023-09-02 ENCOUNTER — Other Ambulatory Visit: Payer: Self-pay | Admitting: Cardiology

## 2023-09-02 VITALS — BP 122/82 | HR 77 | Ht 75.0 in | Wt 289.2 lb

## 2023-09-02 DIAGNOSIS — I200 Unstable angina: Secondary | ICD-10-CM

## 2023-09-02 DIAGNOSIS — E782 Mixed hyperlipidemia: Secondary | ICD-10-CM

## 2023-09-02 DIAGNOSIS — M791 Myalgia, unspecified site: Secondary | ICD-10-CM

## 2023-09-02 DIAGNOSIS — I10 Essential (primary) hypertension: Secondary | ICD-10-CM

## 2023-09-02 DIAGNOSIS — I25119 Atherosclerotic heart disease of native coronary artery with unspecified angina pectoris: Secondary | ICD-10-CM

## 2023-09-02 DIAGNOSIS — Z01818 Encounter for other preprocedural examination: Secondary | ICD-10-CM

## 2023-09-02 DIAGNOSIS — T466X5D Adverse effect of antihyperlipidemic and antiarteriosclerotic drugs, subsequent encounter: Secondary | ICD-10-CM

## 2023-09-02 DIAGNOSIS — T466X5A Adverse effect of antihyperlipidemic and antiarteriosclerotic drugs, initial encounter: Secondary | ICD-10-CM

## 2023-09-02 MED ORDER — ROSUVASTATIN CALCIUM 5 MG PO TABS: 5.0000 mg | ORAL_TABLET | Freq: Every day | ORAL | 3 refills | Status: DC

## 2023-09-02 NOTE — H&P (View-Only) (Signed)
 Cardiology Office Note  Date: 09/02/2023   ID: FAYETTE HAMADA, DOB [birth year], MRN [id number]  History of Present Illness: Jerome Shepherd is a 58 y.o. male last seen in November 2024 by Ms. Philis Nettle NP, I reviewed the note.  He is here for a follow-up visit to discuss interval symptoms.  Continues to work at [employer].  States that he has been having recurring episodes of a dull chest discomfort, not as intense as prior ACS but still reminiscent of angina.  Some episodes occurring with activity including walking up an incline or moving traffic cones at work, also associated with shortness of breath and diaphoresis.  Symptoms have been worse over the last week in particular.  We went over his medications today.  He reports compliance with therapy.  I do note that he was taken off Lipitor by PCP given myalgias.  Blood pressure is well-controlled on current antihypertensive regimen.  He did undergo follow-up cardiac structural and ischemic testing in March of last year which was overall reassuring at that time.  I reviewed his ECG today which shows sinus rhythm with decreased R wave progression.  Physical Exam: VS:  BP 122/82   Pulse 77   Ht 6\' 3"  (1.905 m)   Wt 289 lb 3.2 oz (131.2 kg)   SpO2 95%   BMI 36.15 kg/m , BMI Body mass index is 36.15 kg/m.  Wt Readings from Last 3 Encounters:  09/02/23 289 lb 3.2 oz (131.2 kg)  05/09/23 292 lb 3.2 oz (132.5 kg)  09/30/22 292 lb 12.8 oz (132.8 kg)    General: Patient appears comfortable at rest. HEENT: Conjunctiva and lids normal. Neck: Supple, no elevated JVP or carotid bruits. Lungs: Clear to auscultation, nonlabored breathing at rest. Cardiac: Regular rate and rhythm, no S3 or significant systolic murmur, no pericardial rub. Abdomen: Soft, nontender, bowel sounds present. Extremities: No pitting edema, distal pulses 2+. Skin: Warm and dry. Musculoskeletal: No kyphosis. Neuropsychiatric: Alert and oriented x3, affect grossly  appropriate.  ECG:  An ECG dated 05/09/2023 was personally reviewed today and demonstrated:  Sinus rhythm with borderline low voltage.  Labwork:  November 2023: Potassium 4.3, BUN 16, creatinine 1.15, GFR 74, AST 30, ALT 19, hemoglobin A1c 7.6%, cholesterol 104, triglycerides 206, HDL 33, LDL 29.  Other Studies Reviewed Today:  No interval cardiac testing for review today.  Assessment and Plan:  1.  CAD status post atherectomy and DES to the proximal/mid RCA in April 2022 with moderate proximal LAD disease and occluded AV circumflex managed medically.  Follow-up Lexiscan Myoview in March 2024 was low risk showing no large ischemic territories.  Echocardiogram at that time revealed LVEF 60 to 65% without regional wall motion abnormalities.  He now presents for follow-up reporting accelerating angina as discussed above on stable medical regimen.  We have discussed the risks and benefits of a follow-up diagnostic cardiac catheterization and he is in agreement to proceed.  Obtain follow-up CBC and BMET.  Continue aspirin 81 mg daily, Farxiga 10 mg daily, Imdur 90 mg daily, and starting Crestor 5 mg daily for now (prior myalgias on Lipitor).  2.  Primary hypertension.  Blood pressure is well-controlled today.  Continue Norvasc 2.5 mg daily, Coreg 6.25 mg twice daily, and Cozaar 50 mg daily.  3.  Mixed hyperlipidemia.  History of statin myalgias, taken off Lipitor by PCP.  Plan to start Crestor beginning at 5 mg daily and titrate as tolerated.  He is also on omega-3 supplements.  Disposition:  Follow up  after testing.  Signed, Jonelle Sidle, M.D., F.A.C.C. [facility]

## 2023-09-02 NOTE — Patient Instructions (Signed)
 Medication Instructions:   START Crestor 5 mg daily at bedtime  Labwork: CBC,BMET at [facility] UP IN 1 MONTH  Testing/Procedures:  [place] [facility] DEPT OF [facility] [place] [facility] [address] [place] [address] Dept: [phone] Loc: [phone]  Jerome Shepherd  09/02/2023  You are scheduled for a Cardiac Catheterization on Friday, March 14 with Dr. Peter [place].  1. Please arrive at the [facility] (Main Entrance A) at [facility]: [address] [place], [place] [address] at 10:00 AM (This time is 2 hour(s) before your procedure to ensure your preparation).   Free valet parking service is available. You will check in at ADMITTING. The support person will be asked to wait in the waiting room.  It is OK to have someone drop you off and come back when you are ready to be discharged.    Special note: Every effort is made to have your procedure done on time. Please understand that emergencies sometimes delay scheduled procedures.  2. Diet: Do not eat solid foods after midnight.  The patient may have clear liquids until 5am upon the day of the procedure.  3. Labs: You will need to have blood drawn at [facility]:CBC,BMET,   4. Medication instructions in preparation for your procedure:   Contrast Allergy: No   HOLD FARXIGA 3 DAYS BEFORE CATH  Do not take Diabetes Med Glucovance (Metformin + Glyburide) on the day of the procedure and HOLD 48 HOURS AFTER THE PROCEDURE.  On the morning of your procedure, take your Aspirin 81 mg and any morning medicines NOT listed above.  You may use sips of water.  5. Plan to go home the same day, you will only stay overnight if medically necessary. 6. Bring a current list of your medications and current insurance cards. 7. You MUST have a responsible person to drive you home. 8. Someone MUST be with you the first 24 hours after you arrive home or your discharge will be  delayed. 9. Please wear clothes that are easy to get on and off and wear slip-on shoes.  Thank you for allowing [place] to care for you!   -- [facility]

## 2023-09-02 NOTE — Progress Notes (Signed)
 Cardiology Office Note  Date: 09/02/2023   ID: Jerome Shepherd, DOB [birth year], MRN [id number]  History of Present Illness: Jerome Shepherd is a 58 y.o. male last seen in November 2024 by Ms. Philis Nettle NP, I reviewed the note.  He is here for a follow-up visit to discuss interval symptoms.  Continues to work at [employer].  States that he has been having recurring episodes of a dull chest discomfort, not as intense as prior ACS but still reminiscent of angina.  Some episodes occurring with activity including walking up an incline or moving traffic cones at work, also associated with shortness of breath and diaphoresis.  Symptoms have been worse over the last week in particular.  We went over his medications today.  He reports compliance with therapy.  I do note that he was taken off Lipitor by PCP given myalgias.  Blood pressure is well-controlled on current antihypertensive regimen.  He did undergo follow-up cardiac structural and ischemic testing in March of last year which was overall reassuring at that time.  I reviewed his ECG today which shows sinus rhythm with decreased R wave progression.  Physical Exam: VS:  BP 122/82   Pulse 77   Ht 6\' 3"  (1.905 m)   Wt 289 lb 3.2 oz (131.2 kg)   SpO2 95%   BMI 36.15 kg/m , BMI Body mass index is 36.15 kg/m.  Wt Readings from Last 3 Encounters:  09/02/23 289 lb 3.2 oz (131.2 kg)  05/09/23 292 lb 3.2 oz (132.5 kg)  09/30/22 292 lb 12.8 oz (132.8 kg)    General: Patient appears comfortable at rest. HEENT: Conjunctiva and lids normal. Neck: Supple, no elevated JVP or carotid bruits. Lungs: Clear to auscultation, nonlabored breathing at rest. Cardiac: Regular rate and rhythm, no S3 or significant systolic murmur, no pericardial rub. Abdomen: Soft, nontender, bowel sounds present. Extremities: No pitting edema, distal pulses 2+. Skin: Warm and dry. Musculoskeletal: No kyphosis. Neuropsychiatric: Alert and oriented x3, affect grossly  appropriate.  ECG:  An ECG dated 05/09/2023 was personally reviewed today and demonstrated:  Sinus rhythm with borderline low voltage.  Labwork:  November 2023: Potassium 4.3, BUN 16, creatinine 1.15, GFR 74, AST 30, ALT 19, hemoglobin A1c 7.6%, cholesterol 104, triglycerides 206, HDL 33, LDL 29.  Other Studies Reviewed Today:  No interval cardiac testing for review today.  Assessment and Plan:  1.  CAD status post atherectomy and DES to the proximal/mid RCA in April 2022 with moderate proximal LAD disease and occluded AV circumflex managed medically.  Follow-up Lexiscan Myoview in March 2024 was low risk showing no large ischemic territories.  Echocardiogram at that time revealed LVEF 60 to 65% without regional wall motion abnormalities.  He now presents for follow-up reporting accelerating angina as discussed above on stable medical regimen.  We have discussed the risks and benefits of a follow-up diagnostic cardiac catheterization and he is in agreement to proceed.  Obtain follow-up CBC and BMET.  Continue aspirin 81 mg daily, Farxiga 10 mg daily, Imdur 90 mg daily, and starting Crestor 5 mg daily for now (prior myalgias on Lipitor).  2.  Primary hypertension.  Blood pressure is well-controlled today.  Continue Norvasc 2.5 mg daily, Coreg 6.25 mg twice daily, and Cozaar 50 mg daily.  3.  Mixed hyperlipidemia.  History of statin myalgias, taken off Lipitor by PCP.  Plan to start Crestor beginning at 5 mg daily and titrate as tolerated.  He is also on omega-3 supplements.  Disposition:  Follow up  after testing.  Signed, Jonelle Sidle, M.D., F.A.C.C. [facility]

## 2023-09-03 LAB — BASIC METABOLIC PANEL
BUN/Creatinine Ratio: 14 (ref 9–20)
BUN: 14 mg/dL (ref 6–24)
CO2: 20 mmol/L (ref 20–29)
Calcium: 9.4 mg/dL (ref 8.7–10.2)
Chloride: 101 mmol/L (ref 96–106)
Creatinine, Ser: 0.98 mg/dL (ref 0.76–1.27)
Glucose: 146 mg/dL — ABNORMAL HIGH (ref 70–99)
Potassium: 4.4 mmol/L (ref 3.5–5.2)
Sodium: 138 mmol/L (ref 134–144)
eGFR: 89 mL/min/{1.73_m2} (ref >59)

## 2023-09-03 LAB — CBC
Hematocrit: 49.4 % (ref 37.5–51.0)
Hemoglobin: 17.1 g/dL (ref 13.0–17.7)
MCH: 31.8 pg (ref 26.6–33.0)
MCHC: 34.6 g/dL (ref 31.5–35.7)
MCV: 92 fL (ref 79–97)
Platelets: 252 10*3/uL (ref 150–450)
RBC: 5.37 x10E6/uL (ref 4.14–5.80)
RDW: 13.2 % (ref 11.6–15.4)
WBC: 7.8 10*3/uL (ref 3.4–10.8)

## 2023-09-04 ENCOUNTER — Telehealth: Payer: Self-pay | Admitting: *Deleted

## 2023-09-04 NOTE — Telephone Encounter (Signed)
 Cardiac Catheterization scheduled at [facility] for: Friday September 05, 2023 12 Noon Arrival time [facility] Main Entrance A at: 10 AM  Nothing to eat after midnight prior to procedure, clear liquids until 5 AM day of procedure.  Medication instructions: -Hold:   Metformin-day of procedure and 48 hours post procedure  Farxiga-AM of procedure -Other usual morning medications can be taken with sips of water including aspirin 81 mg.  Patient reports Trulicity weekly on Sundays-last dose 08/31/23  Plan to go home the same day, you will only stay overnight if medically necessary.  You must have responsible adult to drive you home.  Someone must be with you the first 24 hours after you arrive home.  Reviewed procedure instructions with patient.

## 2023-09-05 ENCOUNTER — Encounter (HOSPITAL_COMMUNITY)
Admission: RE | Disposition: A | Discharge status: Home / Self Care | Payer: Self-pay | Source: Home / Self Care | Attending: Cardiology

## 2023-09-05 ENCOUNTER — Other Ambulatory Visit: Payer: Self-pay

## 2023-09-05 ENCOUNTER — Ambulatory Visit (HOSPITAL_COMMUNITY)
Admission: RE | Admit: 2023-09-05 | Discharge: 2023-09-05 | Disposition: A | Discharge status: Home / Self Care | Attending: Cardiology | Admitting: Cardiology

## 2023-09-05 DIAGNOSIS — M791 Myalgia, unspecified site: Secondary | ICD-10-CM | POA: Diagnosis not present

## 2023-09-05 DIAGNOSIS — I2510 Atherosclerotic heart disease of native coronary artery without angina pectoris: Secondary | ICD-10-CM

## 2023-09-05 DIAGNOSIS — I200 Unstable angina: Secondary | ICD-10-CM

## 2023-09-05 DIAGNOSIS — E782 Mixed hyperlipidemia: Secondary | ICD-10-CM | POA: Insufficient documentation

## 2023-09-05 DIAGNOSIS — Z955 Presence of coronary angioplasty implant and graft: Secondary | ICD-10-CM | POA: Insufficient documentation

## 2023-09-05 DIAGNOSIS — I10 Essential (primary) hypertension: Secondary | ICD-10-CM | POA: Diagnosis not present

## 2023-09-05 DIAGNOSIS — Z79899 Other long term (current) drug therapy: Secondary | ICD-10-CM | POA: Diagnosis not present

## 2023-09-05 DIAGNOSIS — I25110 Atherosclerotic heart disease of native coronary artery with unstable angina pectoris: Secondary | ICD-10-CM | POA: Diagnosis present

## 2023-09-05 HISTORY — PX: LEFT HEART CATH AND CORONARY ANGIOGRAPHY: CATH118249

## 2023-09-05 LAB — GLUCOSE, CAPILLARY
Glucose-Capillary: 177 mg/dL — ABNORMAL HIGH (ref 70–99)
Glucose-Capillary: 132 mg/dL — ABNORMAL HIGH (ref 70–99)

## 2023-09-05 SURGERY — LEFT HEART CATH AND CORONARY ANGIOGRAPHY
Anesthesia: LOCAL

## 2023-09-05 MED ORDER — SODIUM CHLORIDE 0.9% FLUSH: 3.0000 mL | Freq: Two times a day (BID) | INTRAVENOUS | Status: DC

## 2023-09-05 MED ORDER — SODIUM CHLORIDE 0.9 % IV SOLN: 250.0000 mL | INTRAVENOUS | Status: DC | PRN

## 2023-09-05 MED ORDER — HEPARIN SODIUM (PORCINE) 1000 UNIT/ML IJ SOLN
INTRAMUSCULAR | Status: AC
Start: 2023-09-05 — End: ?
  Filled 2023-09-05: qty 10

## 2023-09-05 MED ORDER — MIDAZOLAM HCL 2 MG/2ML IJ SOLN
INTRAMUSCULAR | Status: DC | PRN
  Administered 2023-09-05: 2 mg via INTRAVENOUS

## 2023-09-05 MED ORDER — MIDAZOLAM HCL 2 MG/2ML IJ SOLN
INTRAMUSCULAR | Status: AC
  Filled 2023-09-05: qty 2

## 2023-09-05 MED ORDER — METFORMIN HCL 500 MG PO TABS: 500.0000 mg | ORAL_TABLET | Freq: Every day | ORAL | Status: AC

## 2023-09-05 MED ORDER — SODIUM CHLORIDE 0.9 % WEIGHT BASED INFUSION: 1.0000 mL/kg/h | INTRAVENOUS | Status: DC

## 2023-09-05 MED ORDER — ASPIRIN 81 MG PO CHEW: 81.0000 mg | CHEWABLE_TABLET | ORAL | Status: DC

## 2023-09-05 MED ORDER — HEPARIN (PORCINE) IN NACL 1000-0.9 UT/500ML-% IV SOLN
INTRAVENOUS | Status: DC | PRN
  Administered 2023-09-05: 1000 mL

## 2023-09-05 MED ORDER — LIDOCAINE HCL (PF) 1 % IJ SOLN
INTRAMUSCULAR | Status: AC
Start: 2023-09-05 — End: ?
  Filled 2023-09-05: qty 30

## 2023-09-05 MED ORDER — IOHEXOL 350 MG/ML SOLN
INTRAVENOUS | Status: DC | PRN
  Administered 2023-09-05: 60 mL

## 2023-09-05 MED ORDER — SODIUM CHLORIDE 0.9 % WEIGHT BASED INFUSION: 3.0000 mL/kg/h | INTRAVENOUS | Status: AC

## 2023-09-05 MED ORDER — ACETAMINOPHEN 325 MG PO TABS: 650.0000 mg | ORAL_TABLET | ORAL | Status: DC | PRN

## 2023-09-05 MED ORDER — FENTANYL CITRATE (PF) 100 MCG/2ML IJ SOLN
INTRAMUSCULAR | Status: AC
  Filled 2023-09-05: qty 2

## 2023-09-05 MED ORDER — VERAPAMIL HCL 2.5 MG/ML IV SOLN
INTRAVENOUS | Status: AC
Start: 2023-09-05 — End: ?
  Filled 2023-09-05: qty 2

## 2023-09-05 MED ORDER — FENTANYL CITRATE (PF) 100 MCG/2ML IJ SOLN
INTRAMUSCULAR | Status: DC | PRN
  Administered 2023-09-05: 25 ug via INTRAVENOUS

## 2023-09-05 MED ORDER — ONDANSETRON HCL 4 MG/2ML IJ SOLN: 4.0000 mg | Freq: Four times a day (QID) | INTRAMUSCULAR | Status: DC | PRN

## 2023-09-05 MED ORDER — HYDRALAZINE HCL 20 MG/ML IJ SOLN: 10.0000 mg | INTRAMUSCULAR | Status: DC | PRN

## 2023-09-05 MED ORDER — VERAPAMIL HCL 2.5 MG/ML IV SOLN: INTRAVENOUS | Status: DC | PRN

## 2023-09-05 MED ORDER — SODIUM CHLORIDE 0.9% FLUSH: 3.0000 mL | INTRAVENOUS | Status: DC | PRN

## 2023-09-05 MED ORDER — HEPARIN SODIUM (PORCINE) 1000 UNIT/ML IJ SOLN
INTRAMUSCULAR | Status: DC | PRN
  Administered 2023-09-05: 6000 [IU] via INTRAVENOUS

## 2023-09-05 SURGICAL SUPPLY — 12 items
CATH INFINITI 5 FR JL3.5 (CATHETERS) IMPLANT
CATH LAUNCHER 5F RADR (CATHETERS) IMPLANT
CATH VISTA GUIDE 6FR JL3 (CATHETERS) IMPLANT
CATHETER LAUNCHER 5F RADR (CATHETERS) ×1 IMPLANT
DEVICE RAD COMP TR BAND LRG (VASCULAR PRODUCTS) IMPLANT
GLIDESHEATH SLEND SS 6F .021 (SHEATH) IMPLANT
GUIDEWIRE INQWIRE 1.5J.035X260 (WIRE) IMPLANT
INQWIRE 1.5J .035X260CM (WIRE) ×1 IMPLANT
KIT SINGLE USE MANIFOLD (KITS) IMPLANT
PACK CARDIAC CATHETERIZATION (CUSTOM PROCEDURE TRAY) ×1 IMPLANT
SET ATX-X65L (MISCELLANEOUS) IMPLANT
SHEATH PROBE COVER 6X72 (BAG) IMPLANT

## 2023-09-05 NOTE — Interval H&P Note (Signed)
 History and Physical Interval Note:  09/05/2023 11:13 AM  Jerome Shepherd  has presented today for surgery, with the diagnosis of accelerated angina.  The various methods of treatment have been discussed with the patient and family. After consideration of risks, benefits and other options for treatment, the patient has consented to  Procedure(s): LEFT HEART CATH AND CORONARY ANGIOGRAPHY (N/A) as a surgical intervention.  The patient's history has been reviewed, patient examined, no change in status, stable for surgery.  I have reviewed the patient's chart and labs.  Questions were answered to the patient's satisfaction.    Cath Lab Visit (complete for each Cath Lab visit)  Clinical Evaluation Leading to the Procedure:   ACS: Yes.    Non-ACS:    Anginal Classification: CCS III  Anti-ischemic medical therapy: Maximal Therapy (2 or more classes of medications)  Non-Invasive Test Results: No non-invasive testing performed  Prior CABG: No previous CABG       Theron Arista [facility] 09/05/2023 11:13 AM

## 2023-09-05 NOTE — Discharge Instructions (Signed)

## 2023-09-08 ENCOUNTER — Encounter (HOSPITAL_COMMUNITY): Payer: Self-pay | Admitting: Cardiology

## 2023-09-09 MED FILL — Lidocaine HCl Local Preservative Free (PF) Inj 1%: INTRAMUSCULAR | Qty: 30 | Status: AC

## 2023-10-03 ENCOUNTER — Other Ambulatory Visit: Payer: Self-pay

## 2023-10-13 ENCOUNTER — Ambulatory Visit: Admitting: Nurse Practitioner

## 2023-10-17 ENCOUNTER — Other Ambulatory Visit: Payer: Self-pay

## 2023-10-17 MED ORDER — LOSARTAN POTASSIUM 50 MG PO TABS: 50.0000 mg | ORAL_TABLET | Freq: Every day | ORAL | 3 refills | Status: AC

## 2023-10-28 ENCOUNTER — Telehealth: Payer: Self-pay | Admitting: Cardiology

## 2023-10-28 DIAGNOSIS — Z79899 Other long term (current) drug therapy: Secondary | ICD-10-CM

## 2023-10-28 NOTE — Telephone Encounter (Signed)
 Pt c/o medication issue:  1. Name of Medication:    rosuvastatin  (CRESTOR ) 5 MG tablet    2. How are you currently taking this medication (dosage and times per day)?    3. Are you having a reaction (difficulty breathing--STAT)? no  4. What is your medication issue? Causing server leg and foot cramp on the right side. Believe the medication is causing it. Please advise

## 2023-10-28 NOTE — Telephone Encounter (Signed)
 Returned call to pt. No answer. Left msg to call back.

## 2023-10-29 NOTE — Telephone Encounter (Signed)
 Returned call to pt. No answer. Left msg to call back.

## 2023-10-31 MED ORDER — EZETIMIBE 10 MG PO TABS: 10.0000 mg | ORAL_TABLET | Freq: Every day | ORAL | 3 refills | Status: AC

## 2023-10-31 NOTE — Telephone Encounter (Signed)
 Patient stopped crestor  5 days ago and symptoms have resolved.  Please advise

## 2023-10-31 NOTE — Telephone Encounter (Signed)
 Patient agrees to start zetia 10 mg daily and repeat lipids in 3 months.

## 2023-10-31 NOTE — Addendum Note (Signed)
 Addended by: Eunie Lawn A on: 10/31/2023 03:56 PM   Modules accepted: Orders

## 2024-03-15 ENCOUNTER — Ambulatory Visit: Admitting: Physician Assistant

## 2024-03-15 ENCOUNTER — Encounter: Payer: Self-pay | Admitting: Physician Assistant

## 2024-03-15 VITALS — BP 153/81 | HR 82

## 2024-03-15 DIAGNOSIS — L570 Actinic keratosis: Secondary | ICD-10-CM | POA: Diagnosis not present

## 2024-03-15 DIAGNOSIS — Z1283 Encounter for screening for malignant neoplasm of skin: Secondary | ICD-10-CM | POA: Diagnosis not present

## 2024-03-15 DIAGNOSIS — D489 Neoplasm of uncertain behavior, unspecified: Secondary | ICD-10-CM

## 2024-03-15 DIAGNOSIS — L821 Other seborrheic keratosis: Secondary | ICD-10-CM

## 2024-03-15 DIAGNOSIS — L719 Rosacea, unspecified: Secondary | ICD-10-CM

## 2024-03-15 DIAGNOSIS — L578 Other skin changes due to chronic exposure to nonionizing radiation: Secondary | ICD-10-CM

## 2024-03-15 DIAGNOSIS — A630 Anogenital (venereal) warts: Secondary | ICD-10-CM

## 2024-03-15 DIAGNOSIS — L814 Other melanin hyperpigmentation: Secondary | ICD-10-CM

## 2024-03-15 DIAGNOSIS — D1801 Hemangioma of skin and subcutaneous tissue: Secondary | ICD-10-CM

## 2024-03-15 DIAGNOSIS — W908XXA Exposure to other nonionizing radiation, initial encounter: Secondary | ICD-10-CM

## 2024-03-15 DIAGNOSIS — Z85828 Personal history of other malignant neoplasm of skin: Secondary | ICD-10-CM

## 2024-03-15 MED ORDER — FLUOROURACIL 5 % EX CREA: TOPICAL_CREAM | Freq: Two times a day (BID) | CUTANEOUS | 0 refills | Status: AC

## 2024-03-15 NOTE — Patient Instructions (Signed)

## 2024-03-15 NOTE — Progress Notes (Signed)
 New Patient Visit   Subjective  Jerome Shepherd is a 58 y.o. male NEW PATIENT who presents for the following: spot check  Patient states he  has spot check located on his arms, face, head and groin that he  would like to have examined. Patient reports the areas have been there for 1 year   Has seen Dr. [facility], [place]) and has had treatment to actinic keratoses as well as scraping a skin cancer out of left zygoma area.   Was told that he had genital herpes and treated with cryotherapy.    The following portions of the chart were reviewed this encounter and updated as appropriate: medications, allergies, medical history  Review of Systems:  No other skin or systemic complaints except as noted in HPI or Assessment and Plan.  Objective  Well appearing patient in no apparent distress; mood and affect are within normal limits.  A full examination was performed including scalp, head, eyes, ears, nose, lips, neck, chest, axillae, abdomen, back, buttocks, bilateral upper extremities, bilateral lower extremities, hands, feet, fingers, toes, fingernails, genitals and toenails. All findings within normal limits unless otherwise noted below.   Relevant exam findings are noted in the Assessment and Plan.  Left Dorsal Hand Pink scaly plaque   Assessment & Plan   ROSACEA Exam Mid face erythema with telangiectasias   Rosacea is a chronic progressive skin condition usually affecting the face of adults, causing redness and/or acne bumps. It is treatable but not curable. It sometimes affects the eyes (ocular rosacea) as well. It may respond to topical and/or systemic medication and can flare with stress, sun exposure, alcohol, exercise, topical steroids (including hydrocortisone/cortisone 10) and some foods.  Daily application of broad spectrum spf 30+ sunscreen to face is recommended to reduce flares.  Treatment - deferred    GENITAL WARTS Exam: verrucous  papule(s)  Counseling Discussed viral / HPV (Human Papilloma Virus) etiology and risk of spread /infectivity to other areas of body as well as to other people.  Multiple treatments and methods may be required to clear warts and it is possible treatment may not be successful.  Treatment risks include discoloration; scarring and there is still potential for wart recurrence.  Treatment Plan: - defer treatment until next visit   LENTIGINES, SEBORRHEIC KERATOSES, HEMANGIOMAS - Benign normal skin lesions - Benign-appearing - Call for any changes  MELANOCYTIC NEVI - Tan-brown and/or pink-flesh-colored symmetric macules and papules - Benign appearing on exam today - Observation - Call clinic for new or changing moles - Recommend daily use of broad spectrum spf 30+ sunscreen to sun-exposed areas.   ACTINIC DAMAGE - Chronic condition, secondary to cumulative UV/sun exposure - diffuse scaly erythematous macules with underlying dyspigmentation - Recommend daily broad spectrum sunscreen SPF 30+ to sun-exposed areas, reapply every 2 hours as needed.  - Staying in the shade or wearing long sleeves, sun glasses (UVA+UVB protection) and wide brim hats (4-inch brim around the entire circumference of the hat) are also recommended for sun protection.  - Call for new or changing lesions.  SKIN CANCER SCREENING PERFORMED TODAY   HISTORY OF NON-MELANOMA SKIN CANCER  - will request prior records from last dermatologist    NEOPLASM OF UNCERTAIN BEHAVIOR Left Dorsal Hand Epidermal / dermal shaving  Lesion diameter (cm):  1.3 Informed consent: discussed and consent obtained   Timeout: patient name, date of birth, surgical site, and procedure verified   Procedure prep:  Patient was prepped and draped in usual sterile  fashion Prep type:  Isopropyl alcohol Anesthesia: the lesion was anesthetized in a standard fashion   Anesthetic:  1% lidocaine  w/ epinephrine 1-100,000 buffered w/ 8.4% NaHCO3 Instrument  used: flexible razor blade   Hemostasis achieved with: pressure, aluminum chloride and electrodesiccation   Outcome: patient tolerated procedure well   Post-procedure details: sterile dressing applied and wound care instructions given   Dressing type: bandage and petrolatum    Specimen 1 - Surgical pathology Differential Diagnosis: SCC VS OTHER  Check Margins: No AK (ACTINIC KERATOSIS) (17) Head - Anterior (Face) (3), Left Ear (2), Left Forearm - Anterior (5), Right Forearm - Anterior (2), Scalp (5) Destruction of lesion - Head - Anterior (Face) (3), Left Forearm - Anterior (5), Right Forearm - Anterior (2), Scalp (5) Complexity: simple   Destruction method: cryotherapy   Informed consent: discussed and consent obtained   Timeout:  patient name, date of birth, surgical site, and procedure verified Lesion destroyed using liquid nitrogen: Yes   Region frozen until ice ball extended beyond lesion: Yes   Outcome: patient tolerated procedure well with no complications   Post-procedure details: wound care instructions given    Related Medications fluorouracil  (EFUDEX ) 5 % cream Apply topically 2 (two) times daily. ROSACEA   GENITAL WARTS   LENTIGINES   SEBORRHEIC KERATOSIS   CHERRY ANGIOMA   ACTINIC SKIN DAMAGE   SCREENING EXAM FOR SKIN CANCER   HISTORY OF NONMELANOMA SKIN CANCER    Return in about 1 month (around 04/14/2024) for  (Please have sign release form for previous pathology), Wart follow up.  I, Doyce Pan, CMA, am acting as scribe for Caeleigh Prohaska K, PA-C.  Documentation: I have reviewed the above documentation for accuracy and completeness, and I agree with the above.  Shahed Yeoman K, PA-C

## 2024-03-16 LAB — SURGICAL PATHOLOGY

## 2024-03-18 ENCOUNTER — Ambulatory Visit: Payer: Self-pay | Admitting: Physician Assistant

## 2024-03-22 NOTE — Telephone Encounter (Signed)
 Advised patient of results. He is scheduled for follow up 04/12/2024 and can have this area treated at that appointment/hd

## 2024-03-22 NOTE — Telephone Encounter (Signed)
-----   Message from [facility] K sent at 03/18/2024  3:46 PM EDT ----- Left dorsal hand - hypertrophic AK + SK -- NEEDS CRYO -- add on anywhere - can DB or add on at 4:15  ----- Message ----- From: Interface, Lab In Three Zero One Sent: 03/16/2024   4:55 PM EDT To: Erminio MARLA Like, PA-C

## 2024-04-12 ENCOUNTER — Encounter: Payer: Self-pay | Admitting: Physician Assistant

## 2024-04-12 ENCOUNTER — Ambulatory Visit: Admitting: Physician Assistant

## 2024-04-12 VITALS — BP 168/92 | HR 74

## 2024-04-12 DIAGNOSIS — L821 Other seborrheic keratosis: Secondary | ICD-10-CM | POA: Diagnosis not present

## 2024-04-12 DIAGNOSIS — L570 Actinic keratosis: Secondary | ICD-10-CM

## 2024-04-12 DIAGNOSIS — Z7189 Other specified counseling: Secondary | ICD-10-CM | POA: Diagnosis not present

## 2024-04-12 DIAGNOSIS — W908XXA Exposure to other nonionizing radiation, initial encounter: Secondary | ICD-10-CM | POA: Diagnosis not present

## 2024-04-12 DIAGNOSIS — A630 Anogenital (venereal) warts: Secondary | ICD-10-CM | POA: Diagnosis not present

## 2024-04-12 NOTE — Progress Notes (Signed)
   Follow-Up Visit   Subjective  Jerome Shepherd is a 58 y.o. male who presents for the following: treatment of condyloma.   Patient present today for follow up visit for warts. Patient was last evaluated on 03/15/24. Treatment was deferred until today's appointment.   In addition, he has a biopsy proven hyperkeratotic actinic keratosis that needs treated today.    The following portions of the chart were reviewed this encounter and updated as appropriate: medications, allergies, medical history  Review of Systems:  No other skin or systemic complaints except as noted in HPI or Assessment and Plan.  Objective  Well appearing patient in no apparent distress; mood and affect are within normal limits.  A full examination was performed including scalp, head, eyes, ears, nose, lips, neck, chest, axillae, abdomen, back, buttocks, bilateral upper extremities, bilateral lower extremities, hands, feet, fingers, toes, fingernails, and toenails. All findings within normal limits unless otherwise noted below.   A focused examination was performed of the following areas: Groin area and left hand  Relevant exam findings are noted in the Assessment and Plan.  Left Dorsal Hand Erythematous scaly patch - biopsy proven hyperkeratotic actinic keratosis   Assessment & Plan   SEBORRHEIC KERATOSIS - Stuck-on, waxy, tan-brown papules and/or plaques  - Benign-appearing - Discussed benign etiology and prognosis. - Observe - Call for any changes     CONDYLOMA ACUMATUM - PENIS  Exam: multiple pink verrucous papule(s)   Counseling Discussed viral / HPV (Human Papilloma Virus) etiology and risk of spread /infectivity to other areas of body as well as to other people.  Multiple treatments and methods may be required to clear warts and it is possible treatment may not be successful.  Treatment risks include discoloration; scarring and there is still potential for wart recurrence.  Treatment Plan: -  cryotherapy today    ACTINIC KERATOSIS Left Dorsal Hand Destruction of lesion - Left Dorsal Hand Complexity: simple   Destruction method: cryotherapy   Informed consent: discussed and consent obtained   Timeout:  patient name, date of birth, surgical site, and procedure verified Lesion destroyed using liquid nitrogen: Yes   Region frozen until ice ball extended beyond lesion: Yes   Outcome: patient tolerated procedure well with no complications   Post-procedure details: wound care instructions given    CONDYLOMA ACUMINATUM (20) Penis (20) Destruction of lesion - Penis (20) Complexity: simple   Destruction method: cryotherapy   Informed consent: discussed and consent obtained   Timeout:  patient name, date of birth, surgical site, and procedure verified Lesion destroyed using liquid nitrogen: Yes   Region frozen until ice ball extended beyond lesion: Yes   Outcome: patient tolerated procedure well with no complications   Post-procedure details: wound care instructions given     Return in about 7 weeks (around 05/31/2024) for warts follow up.  I, Doyce Pan, CMA, am acting as scribe for Nakia Remmers K, PA-C.   Documentation: I have reviewed the above documentation for accuracy and completeness, and I agree with the above.  Maudy Yonan K, PA-C

## 2024-04-12 NOTE — Patient Instructions (Signed)

## 2024-06-08 ENCOUNTER — Ambulatory Visit: Admitting: Physician Assistant
